# Patient Record
Sex: Female | Born: 1950
Health system: Southern US, Community
[De-identification: ages and names within clinical notes are randomized; demographics above are authoritative.]

## PROBLEM LIST (undated history)

## (undated) DIAGNOSIS — Z9289 Personal history of other medical treatment: Secondary | ICD-10-CM

## (undated) DIAGNOSIS — J45909 Unspecified asthma, uncomplicated: Secondary | ICD-10-CM

## (undated) DIAGNOSIS — E079 Disorder of thyroid, unspecified: Secondary | ICD-10-CM

## (undated) DIAGNOSIS — L309 Dermatitis, unspecified: Secondary | ICD-10-CM

## (undated) DIAGNOSIS — G43909 Migraine, unspecified, not intractable, without status migrainosus: Secondary | ICD-10-CM

## (undated) DIAGNOSIS — K529 Noninfective gastroenteritis and colitis, unspecified: Secondary | ICD-10-CM

## (undated) DIAGNOSIS — I10 Essential (primary) hypertension: Secondary | ICD-10-CM

## (undated) DIAGNOSIS — J301 Allergic rhinitis due to pollen: Secondary | ICD-10-CM

## (undated) DIAGNOSIS — F609 Personality disorder, unspecified: Secondary | ICD-10-CM

## (undated) DIAGNOSIS — Z8489 Family history of other specified conditions: Secondary | ICD-10-CM

## (undated) DIAGNOSIS — J329 Chronic sinusitis, unspecified: Secondary | ICD-10-CM

## (undated) DIAGNOSIS — M810 Age-related osteoporosis without current pathological fracture: Secondary | ICD-10-CM

## (undated) DIAGNOSIS — M722 Plantar fascial fibromatosis: Secondary | ICD-10-CM

## (undated) DIAGNOSIS — I8393 Asymptomatic varicose veins of bilateral lower extremities: Secondary | ICD-10-CM

## (undated) DIAGNOSIS — N8111 Cystocele, midline: Secondary | ICD-10-CM

## (undated) DIAGNOSIS — F4322 Adjustment disorder with anxiety: Secondary | ICD-10-CM

## (undated) DIAGNOSIS — C801 Malignant (primary) neoplasm, unspecified: Secondary | ICD-10-CM

## (undated) DIAGNOSIS — K635 Polyp of colon: Secondary | ICD-10-CM

## (undated) DIAGNOSIS — R5381 Other malaise: Secondary | ICD-10-CM

## (undated) DIAGNOSIS — R32 Unspecified urinary incontinence: Secondary | ICD-10-CM

## (undated) DIAGNOSIS — G3184 Mild cognitive impairment, so stated: Secondary | ICD-10-CM

## (undated) DIAGNOSIS — N816 Rectocele: Secondary | ICD-10-CM

## (undated) DIAGNOSIS — Z780 Asymptomatic menopausal state: Secondary | ICD-10-CM

## (undated) DIAGNOSIS — N312 Flaccid neuropathic bladder, not elsewhere classified: Secondary | ICD-10-CM

## (undated) DIAGNOSIS — K219 Gastro-esophageal reflux disease without esophagitis: Secondary | ICD-10-CM

## (undated) DIAGNOSIS — M7989 Other specified soft tissue disorders: Secondary | ICD-10-CM

## (undated) DIAGNOSIS — K5901 Slow transit constipation: Secondary | ICD-10-CM

## (undated) DIAGNOSIS — N952 Postmenopausal atrophic vaginitis: Secondary | ICD-10-CM

## (undated) DIAGNOSIS — R5383 Other fatigue: Secondary | ICD-10-CM

## (undated) DIAGNOSIS — N3942 Incontinence without sensory awareness: Secondary | ICD-10-CM

## (undated) DIAGNOSIS — C4491 Basal cell carcinoma of skin, unspecified: Secondary | ICD-10-CM

## (undated) DIAGNOSIS — I872 Venous insufficiency (chronic) (peripheral): Secondary | ICD-10-CM

## (undated) DIAGNOSIS — R198 Other specified symptoms and signs involving the digestive system and abdomen: Secondary | ICD-10-CM

## (undated) DIAGNOSIS — K5730 Diverticulosis of large intestine without perforation or abscess without bleeding: Secondary | ICD-10-CM

## (undated) DIAGNOSIS — G44209 Tension-type headache, unspecified, not intractable: Secondary | ICD-10-CM

## (undated) DIAGNOSIS — L659 Nonscarring hair loss, unspecified: Secondary | ICD-10-CM

## (undated) DIAGNOSIS — M1990 Unspecified osteoarthritis, unspecified site: Secondary | ICD-10-CM

## (undated) DIAGNOSIS — B009 Herpesviral infection, unspecified: Secondary | ICD-10-CM

## (undated) DIAGNOSIS — N993 Prolapse of vaginal vault after hysterectomy: Secondary | ICD-10-CM

## (undated) DIAGNOSIS — R072 Precordial pain: Secondary | ICD-10-CM

## (undated) DIAGNOSIS — N35919 Unspecified urethral stricture, male, unspecified site: Secondary | ICD-10-CM

## (undated) DIAGNOSIS — M62830 Muscle spasm of back: Secondary | ICD-10-CM

## (undated) DIAGNOSIS — J4530 Mild persistent asthma, uncomplicated: Secondary | ICD-10-CM

## (undated) DIAGNOSIS — F339 Major depressive disorder, recurrent, unspecified: Secondary | ICD-10-CM

## (undated) DIAGNOSIS — G629 Polyneuropathy, unspecified: Secondary | ICD-10-CM

## (undated) DIAGNOSIS — T7840XA Allergy, unspecified, initial encounter: Secondary | ICD-10-CM

## (undated) DIAGNOSIS — E039 Hypothyroidism, unspecified: Secondary | ICD-10-CM

## (undated) DIAGNOSIS — R1314 Dysphagia, pharyngoesophageal phase: Secondary | ICD-10-CM

## (undated) DIAGNOSIS — Z90710 Acquired absence of both cervix and uterus: Secondary | ICD-10-CM

## (undated) DIAGNOSIS — Z86010 Personal history of colonic polyps: Secondary | ICD-10-CM

## (undated) DIAGNOSIS — IMO0002 Reserved for concepts with insufficient information to code with codable children: Secondary | ICD-10-CM

## (undated) DIAGNOSIS — M5412 Radiculopathy, cervical region: Secondary | ICD-10-CM

## (undated) HISTORY — DX: Cystocele, midline: N81.11

## (undated) HISTORY — DX: Malignant (primary) neoplasm, unspecified: C80.1

## (undated) HISTORY — DX: Adjustment disorder with anxiety: F43.22

## (undated) HISTORY — DX: Personal history of colonic polyps: Z86.010

## (undated) HISTORY — DX: Gastro-esophageal reflux disease without esophagitis: K21.9

## (undated) HISTORY — DX: Family history of other specified conditions: Z84.89

## (undated) HISTORY — DX: Incontinence without sensory awareness: N39.42

## (undated) HISTORY — DX: Personality disorder, unspecified: F60.9

## (undated) HISTORY — DX: Disorder of thyroid, unspecified: E07.9

## (undated) HISTORY — PX: BREAST CYST ASPIRATION: SHX578

## (undated) HISTORY — DX: Postmenopausal atrophic vaginitis: N95.2

## (undated) HISTORY — DX: Major depressive disorder, recurrent, unspecified: F33.9

## (undated) HISTORY — DX: Other specified soft tissue disorders: M79.89

## (undated) HISTORY — DX: Other specified symptoms and signs involving the digestive system and abdomen: R19.8

## (undated) HISTORY — DX: Rectocele: N81.6

## (undated) HISTORY — DX: Acquired absence of both cervix and uterus: Z90.710

## (undated) HISTORY — DX: Nonscarring hair loss, unspecified: L65.9

## (undated) HISTORY — DX: Other malaise: R53.81

## (undated) HISTORY — DX: Tension-type headache, unspecified, not intractable: G44.209

## (undated) HISTORY — PX: VAGINAL PROLAPSE REPAIR: SHX830

## (undated) HISTORY — DX: Slow transit constipation: K59.01

## (undated) HISTORY — DX: Dysphagia, pharyngoesophageal phase: R13.14

## (undated) HISTORY — DX: Allergic rhinitis due to pollen: J30.1

## (undated) HISTORY — DX: Basal cell carcinoma of skin, unspecified: C44.91

## (undated) HISTORY — DX: Diverticulosis of large intestine without perforation or abscess without bleeding: K57.30

## (undated) HISTORY — DX: Asymptomatic menopausal state: Z78.0

## (undated) HISTORY — DX: Herpesviral infection, unspecified: B00.9

## (undated) HISTORY — DX: Venous insufficiency (chronic) (peripheral): I87.2

## (undated) HISTORY — DX: Precordial pain: R07.2

## (undated) HISTORY — PX: VAGINAL HYSTERECTOMY: SHX2639

## (undated) HISTORY — DX: Flaccid neuropathic bladder, not elsewhere classified: N31.2

## (undated) HISTORY — DX: Noninfective gastroenteritis and colitis, unspecified: K52.9

## (undated) HISTORY — DX: Chronic sinusitis, unspecified: J32.9

## (undated) HISTORY — DX: Migraine, unspecified, not intractable, without status migrainosus: G43.909

## (undated) HISTORY — DX: Unspecified asthma, uncomplicated: J45.909

## (undated) HISTORY — DX: Plantar fascial fibromatosis: M72.2

## (undated) HISTORY — DX: Polyneuropathy, unspecified: G62.9

## (undated) HISTORY — DX: Asymptomatic varicose veins of bilateral lower extremities: I83.93

## (undated) HISTORY — DX: Age-related osteoporosis without current pathological fracture: M81.0

## (undated) HISTORY — DX: Allergy, unspecified, initial encounter: T78.40XA

## (undated) HISTORY — DX: Muscle spasm of back: M62.830

## (undated) HISTORY — DX: Hypothyroidism, unspecified: E03.9

## (undated) HISTORY — DX: Polyp of colon: K63.5

## (undated) HISTORY — DX: Radiculopathy, cervical region: M54.12

## (undated) HISTORY — DX: Reserved for concepts with insufficient information to code with codable children: IMO0002

## (undated) HISTORY — DX: Other fatigue: R53.83

## (undated) HISTORY — PX: TONSILLECTOMY: SUR1361

## (undated) HISTORY — DX: Mild cognitive impairment, so stated: G31.84

## (undated) HISTORY — DX: Unspecified urinary incontinence: R32

## (undated) HISTORY — DX: Prolapse of vaginal vault after hysterectomy: N99.3

## (undated) HISTORY — DX: Unspecified osteoarthritis, unspecified site: M19.90

## (undated) HISTORY — DX: Essential (primary) hypertension: I10

## (undated) HISTORY — DX: Personal history of other medical treatment: Z92.89

## (undated) HISTORY — DX: Dermatitis, unspecified: L30.9

## (undated) HISTORY — DX: Unspecified urethral stricture, male, unspecified site: N35.919

## (undated) HISTORY — DX: Mild persistent asthma, uncomplicated: J45.30

---
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
## 2009-05-15 DIAGNOSIS — Z8669 Personal history of other diseases of the nervous system and sense organs: Secondary | ICD-10-CM

## 2009-05-15 HISTORY — DX: Personal history of other diseases of the nervous system and sense organs: Z86.69

## 2009-05-15 HISTORY — PX: RECTOCELE REPAIR: SHX761

## 2012-10-19 DIAGNOSIS — Z79899 Other long term (current) drug therapy: Secondary | ICD-10-CM

## 2012-10-19 DIAGNOSIS — G43909 Migraine, unspecified, not intractable, without status migrainosus: Secondary | ICD-10-CM | POA: Insufficient documentation

## 2012-10-19 DIAGNOSIS — F609 Personality disorder, unspecified: Secondary | ICD-10-CM

## 2012-10-19 HISTORY — DX: Other long term (current) drug therapy: Z79.899

## 2012-10-19 HISTORY — DX: Personality disorder, unspecified: F60.9

## 2013-06-02 DIAGNOSIS — F449 Dissociative and conversion disorder, unspecified: Secondary | ICD-10-CM

## 2013-06-02 HISTORY — DX: Dissociative and conversion disorder, unspecified: F44.9

## 2013-07-14 DIAGNOSIS — F339 Major depressive disorder, recurrent, unspecified: Secondary | ICD-10-CM

## 2013-07-14 HISTORY — DX: Major depressive disorder, recurrent, unspecified: F33.9

## 2013-10-30 DIAGNOSIS — N816 Rectocele: Secondary | ICD-10-CM

## 2013-10-30 DIAGNOSIS — N993 Prolapse of vaginal vault after hysterectomy: Secondary | ICD-10-CM

## 2013-10-30 DIAGNOSIS — E039 Hypothyroidism, unspecified: Secondary | ICD-10-CM | POA: Insufficient documentation

## 2013-10-30 DIAGNOSIS — N312 Flaccid neuropathic bladder, not elsewhere classified: Secondary | ICD-10-CM | POA: Insufficient documentation

## 2013-10-30 DIAGNOSIS — N35919 Unspecified urethral stricture, male, unspecified site: Secondary | ICD-10-CM

## 2013-10-30 DIAGNOSIS — N952 Postmenopausal atrophic vaginitis: Secondary | ICD-10-CM

## 2013-10-30 DIAGNOSIS — J329 Chronic sinusitis, unspecified: Secondary | ICD-10-CM

## 2013-10-30 DIAGNOSIS — L659 Nonscarring hair loss, unspecified: Secondary | ICD-10-CM | POA: Insufficient documentation

## 2013-10-30 DIAGNOSIS — K5730 Diverticulosis of large intestine without perforation or abscess without bleeding: Secondary | ICD-10-CM

## 2013-10-30 DIAGNOSIS — N8111 Cystocele, midline: Secondary | ICD-10-CM

## 2013-10-30 HISTORY — DX: Chronic sinusitis, unspecified: J32.9

## 2013-10-30 HISTORY — DX: Flaccid neuropathic bladder, not elsewhere classified: N31.2

## 2013-10-30 HISTORY — DX: Diverticulosis of large intestine without perforation or abscess without bleeding: K57.30

## 2013-10-30 HISTORY — DX: Postmenopausal atrophic vaginitis: N95.2

## 2013-10-30 HISTORY — DX: Unspecified urethral stricture, male, unspecified site: N35.919

## 2013-10-30 HISTORY — DX: Hypothyroidism, unspecified: E03.9

## 2013-10-30 HISTORY — DX: Rectocele: N81.6

## 2013-10-30 HISTORY — DX: Cystocele, midline: N81.11

## 2013-10-30 HISTORY — DX: Prolapse of vaginal vault after hysterectomy: N99.3

## 2013-10-30 HISTORY — DX: Nonscarring hair loss, unspecified: L65.9

## 2014-03-31 DIAGNOSIS — M545 Low back pain, unspecified: Secondary | ICD-10-CM

## 2014-03-31 DIAGNOSIS — Z780 Asymptomatic menopausal state: Secondary | ICD-10-CM | POA: Insufficient documentation

## 2014-03-31 HISTORY — DX: Asymptomatic menopausal state: Z78.0

## 2014-03-31 HISTORY — DX: Low back pain, unspecified: M54.50

## 2014-07-23 DIAGNOSIS — K5901 Slow transit constipation: Secondary | ICD-10-CM

## 2014-07-23 DIAGNOSIS — J301 Allergic rhinitis due to pollen: Secondary | ICD-10-CM

## 2014-07-23 DIAGNOSIS — IMO0002 Reserved for concepts with insufficient information to code with codable children: Secondary | ICD-10-CM | POA: Insufficient documentation

## 2014-07-23 HISTORY — DX: Slow transit constipation: K59.01

## 2014-07-23 HISTORY — DX: Reserved for concepts with insufficient information to code with codable children: IMO0002

## 2014-07-23 HISTORY — DX: Allergic rhinitis due to pollen: J30.1

## 2014-09-22 DIAGNOSIS — J45901 Unspecified asthma with (acute) exacerbation: Secondary | ICD-10-CM | POA: Insufficient documentation

## 2014-09-22 DIAGNOSIS — G629 Polyneuropathy, unspecified: Secondary | ICD-10-CM

## 2014-09-22 DIAGNOSIS — L309 Dermatitis, unspecified: Secondary | ICD-10-CM | POA: Insufficient documentation

## 2014-09-22 HISTORY — DX: Dermatitis, unspecified: L30.9

## 2014-09-22 HISTORY — DX: Unspecified asthma with (acute) exacerbation: J45.901

## 2014-09-22 HISTORY — DX: Polyneuropathy, unspecified: G62.9

## 2015-02-13 HISTORY — PX: SKIN CANCER EXCISION: SHX779

## 2015-02-17 DIAGNOSIS — R102 Pelvic and perineal pain: Secondary | ICD-10-CM

## 2015-02-17 HISTORY — DX: Pelvic and perineal pain: R10.2

## 2015-04-21 DIAGNOSIS — F4322 Adjustment disorder with anxiety: Secondary | ICD-10-CM

## 2015-04-21 DIAGNOSIS — R5381 Other malaise: Secondary | ICD-10-CM

## 2015-04-21 DIAGNOSIS — R5383 Other fatigue: Secondary | ICD-10-CM

## 2015-04-21 DIAGNOSIS — J208 Acute bronchitis due to other specified organisms: Secondary | ICD-10-CM

## 2015-04-21 DIAGNOSIS — S86899A Other injury of other muscle(s) and tendon(s) at lower leg level, unspecified leg, initial encounter: Secondary | ICD-10-CM | POA: Insufficient documentation

## 2015-04-21 DIAGNOSIS — R1314 Dysphagia, pharyngoesophageal phase: Secondary | ICD-10-CM

## 2015-04-21 DIAGNOSIS — M62830 Muscle spasm of back: Secondary | ICD-10-CM

## 2015-04-21 DIAGNOSIS — H6123 Impacted cerumen, bilateral: Secondary | ICD-10-CM

## 2015-04-21 DIAGNOSIS — C4491 Basal cell carcinoma of skin, unspecified: Secondary | ICD-10-CM | POA: Insufficient documentation

## 2015-04-21 HISTORY — DX: Other injury of other muscle(s) and tendon(s) at lower leg level, unspecified leg, initial encounter: S86.899A

## 2015-04-21 HISTORY — DX: Dysphagia, pharyngoesophageal phase: R13.14

## 2015-04-21 HISTORY — DX: Adjustment disorder with anxiety: F43.22

## 2015-04-21 HISTORY — DX: Muscle spasm of back: M62.830

## 2015-04-21 HISTORY — DX: Other malaise: R53.81

## 2015-04-21 HISTORY — DX: Acute bronchitis due to other specified organisms: J20.8

## 2015-04-21 HISTORY — DX: Basal cell carcinoma of skin, unspecified: C44.91

## 2015-04-21 HISTORY — DX: Impacted cerumen, bilateral: H61.23

## 2015-05-13 ENCOUNTER — Encounter: Payer: Self-pay | Admitting: Pediatrics

## 2015-05-13 ENCOUNTER — Ambulatory Visit (INDEPENDENT_AMBULATORY_CARE_PROVIDER_SITE_OTHER): Payer: Medicare Other | Admitting: Pediatrics

## 2015-05-13 VITALS — BP 120/80 | HR 70 | Temp 98.2°F | Resp 16 | Ht 66.0 in | Wt 146.4 lb

## 2015-05-13 DIAGNOSIS — L500 Allergic urticaria: Secondary | ICD-10-CM | POA: Insufficient documentation

## 2015-05-13 DIAGNOSIS — K219 Gastro-esophageal reflux disease without esophagitis: Secondary | ICD-10-CM | POA: Diagnosis not present

## 2015-05-13 DIAGNOSIS — J4530 Mild persistent asthma, uncomplicated: Secondary | ICD-10-CM | POA: Diagnosis not present

## 2015-05-13 DIAGNOSIS — J3089 Other allergic rhinitis: Secondary | ICD-10-CM | POA: Insufficient documentation

## 2015-05-13 HISTORY — DX: Gastro-esophageal reflux disease without esophagitis: K21.9

## 2015-05-13 HISTORY — DX: Mild persistent asthma, uncomplicated: J45.30

## 2015-05-13 HISTORY — DX: Allergic urticaria: L50.0

## 2015-05-13 HISTORY — DX: Other allergic rhinitis: J30.89

## 2015-05-13 MED ORDER — ALBUTEROL SULFATE HFA 108 (90 BASE) MCG/ACT IN AERS: 2.0000 | INHALATION_SPRAY | Freq: Four times a day (QID) | RESPIRATORY_TRACT | Status: DC | PRN

## 2015-05-13 MED ORDER — MONTELUKAST SODIUM 10 MG PO TABS: 10.0000 mg | ORAL_TABLET | Freq: Every day | ORAL | Status: DC

## 2015-05-13 MED ORDER — BECLOMETHASONE DIPROPIONATE 80 MCG/ACT IN AERS: 2.0000 | INHALATION_SPRAY | Freq: Every day | RESPIRATORY_TRACT | Status: DC

## 2015-05-13 NOTE — Progress Notes (Signed)
[place] [address] Dept: [phone]  New Patient Note  Patient ID: Jenna Campbell, female    DOB: 1951/01/07  Age: 64 y.o. MRN: [id number] Date of Office Visit: 05/13/2015 Referring provider: Charlaine Dalton, MD [facility] [id number] [place], [place] [address]    Chief Complaint: New Patient (Initial Visit)  HPI Jenna Campbell presents for evaluation of some coughing, wheezing and shortness of breath for about 2 years. She has never smoked cigarettes. She also has nasal congestion. She has aggravation of her symptoms on exposure to dust, cigarette smoke and possibly cats. She has had a normal chest x-ray recently. When she is exposed to strong odors such as colognes or food odors she notices some shortness of breath. If she eats very spicy foods she has hives and shortness of breath.  Review of Systems  Constitutional: Negative.   HENT:       Sinus problems for over 2 years  Eyes: Negative.   Respiratory:       Coughing spells and wheezing for over 2 years. Also some shortness of breath  Cardiovascular:       Hypertension  Gastrointestinal:       Gastroesophageal reflux  Genitourinary:       History of UTIs. Repair of rectocele and prolapsed uterus. Lichen sclerosis in the vulvar area. Taking part in a study in [place]: Lichen sclerosis  Musculoskeletal:       Osteoarthritis of her back and knees  Skin:       Frequent episodes of itchy skin. Removal of several basal and squamous carcinomas  from her back and leg  Neurological:       Migraine headaches  Endo/Heme/Allergies:       Hypothyroidism  Psychiatric/Behavioral:       Depression    Outpatient Encounter Prescriptions as of 05/13/2015  Medication Sig  . albuterol (PROAIR HFA) 108 (90 Base) MCG/ACT inhaler Inhale 2 puffs into the lungs every 6 (six) hours as needed for wheezing or shortness of breath.  Marland Kitchen albuterol (PROVENTIL HFA) 108 (90 Base) MCG/ACT inhaler  Inhale 2 puffs into the lungs every 6 (six) hours as needed for wheezing or shortness of breath.  Marland Kitchen albuterol (PROVENTIL) (2.5 MG/3ML) 0.083% nebulizer solution Take 2.5 mg by nebulization every 6 (six) hours as needed for wheezing or shortness of breath.  . beclomethasone (QVAR) 80 MCG/ACT inhaler Inhale 2 puffs into the lungs daily.  . benzonatate (TESSALON) 100 MG capsule Take 100 mg by mouth 3 (three) times daily as needed for cough.  . betamethasone valerate ointment (VALISONE) 0.1 % Apply 1 application topically 2 (two) times daily.  . DULoxetine (CYMBALTA) 30 MG capsule Take 30 mg by mouth daily.  . ergocalciferol (VITAMIN D2) 50000 units capsule Take 50,000 Units by mouth once a week.  . fexofenadine (ALLEGRA) 180 MG tablet Take 180 mg by mouth daily.  . fluticasone (FLONASE) 50 MCG/ACT nasal spray Place 1 spray into both nostrils daily.  . Gabapentin Enacarbil (HORIZANT) 600 MG TBCR Take 600 mg by mouth daily.  Marland Kitchen HYDROcodone-acetaminophen (NORCO) 7.5-325 MG tablet Take 1 tablet by mouth every 6 (six) hours as needed for moderate pain.  Marland Kitchen lactulose (CHRONULAC) 10 GM/15ML solution Take 5 g by mouth daily as needed for mild constipation.  Marland Kitchen levothyroxine (SYNTHROID, LEVOTHROID) 150 MCG tablet Take 150 mcg by mouth daily before breakfast.  . LORazepam (ATIVAN) 0.5 MG tablet Take 0.5 mg by mouth every 8 (eight) hours.  Marland Kitchen  meloxicam (MOBIC) 15 MG tablet Take 15 mg by mouth daily.  . Meth-Hyo-M Bl-Na Phos-Ph Sal (URIBEL) 118 MG CAPS Take by mouth.  . montelukast (SINGULAIR) 10 MG tablet Take 1 tablet (10 mg total) by mouth at bedtime.  Marland Kitchen omeprazole (PRILOSEC) 40 MG capsule Take 40 mg by mouth daily.  . temazepam (RESTORIL) 15 MG capsule Take 15 mg by mouth at bedtime as needed for sleep.  Marland Kitchen topiramate (TOPAMAX) 100 MG tablet Take 100 mg by mouth 2 (two) times daily.   No facility-administered encounter medications on file as of 05/13/2015.     Drug Allergies:  Allergies  Allergen Reactions   . Iodinated Diagnostic Agents Itching, Other (See Comments), Rash and Shortness Of Breath    Throat Swelling, Erythema  . Other Itching and Swelling    DUST  . Penicillins   . Diazepam Rash  . Sulfa Antibiotics Rash    Family History: Atticus's family history includes Asthma in her father. family history is positive for COPD and emphysema. Not clearcut if she has asthma in her family. There is no family history of hayfever, sinus problems, eczema, hives, and food allergies.  Social and environmental-she is disabled. She has a cat in the home. She is very worried about getting Alzheimer's disease because of many family members with Alzheimer's disease.    Physical Exam: BP 120/80 mmHg  Pulse 70  Temp(Src) 98.2 F (36.8 C) (Oral)  Resp 16  Ht 5\' 6"  (1.676 m)  Wt 146 lb 6.2 oz (66.4 kg)  BMI 23.64 kg/m2   Physical Exam  Constitutional: She is oriented to person, place, and time. She appears well-developed and well-nourished.  HENT:  Eyes normal. Ears normal. Nose normal. Pharynx normal.  Neck: Neck supple.  Cardiovascular:  S1 and S2 normal no murmurs  Pulmonary/Chest:  Clear to percussion and auscultation  Abdominal: Soft. There is no tenderness (no hepatosplenomegaly).  Lymphadenopathy:    She has no cervical adenopathy.  Neurological: She is alert and oriented to person, place, and time.  Skin:  Clear but she had dermographia noted  Psychiatric: She has a normal mood and affect. Her behavior is normal. Judgment and thought content normal.  Vitals reviewed.   Diagnostics: Allergy skin tests were positive to molds on intradermal testing only. She had slight reactivity to cat on intradermal testing only  FVC 2.55 L FEV1 2.66 L. Predicted FVC 3.43 L predicted FEV1 2.62 L. After albuterol 2 puffs FVC 2.94 L FEV1 2.61 L-spirometry in the normal range except for a mild reduction in the FVC and there was an 11% improvement in the FEV1 after albuterol 2  puffs   Assessment Assessment and Plan: 1. Mild persistent asthma, uncomplicated   2. Other allergic rhinitis   3. Gastroesophageal reflux disease without esophagitis   4. Allergic urticaria     Meds ordered this encounter  Medications  . montelukast (SINGULAIR) 10 MG tablet    Sig: Take 1 tablet (10 mg total) by mouth at bedtime.    Dispense:  30 tablet    Refill:  5  . beclomethasone (QVAR) 80 MCG/ACT inhaler    Sig: Inhale 2 puffs into the lungs daily.    Dispense:  1 Inhaler    Refill:  5  . albuterol (PROVENTIL HFA) 108 (90 Base) MCG/ACT inhaler    Sig: Inhale 2 puffs into the lungs every 6 (six) hours as needed for wheezing or shortness of breath.    Dispense:  1 Inhaler  Refill:  1    Patient Instructions  Environmental control of dust and mold Allegra 180 mg once a day Pro-air 2 puffs every 4 hours if needed for wheezing or coughing spells or instead albuterol 0.083% one unit dose every 4 hours if needed Fluticasone 2 sprays per nostril once a day Montelukast 10 mg once a day for cough or wheeze Do  foods with salicylates make  you itch Since she could not afford Advair, I gave her a sample of Qvar 80-2 puffs once a day to prevent coughing or wheezing to see if she can afford it and if she does better Continue on her other medications Her insurance plan would not cover Qvar 80, instead she may use Pulmicort 180- 2 puffs once a day    Return in about 6 weeks (around 06/24/2015).   Thank you for the opportunity to care for this patient.  Please do not hesitate to contact me with questions.  Penne Lash, M.D.  [facility] [address] [place], [place] [address] [phone]

## 2015-05-13 NOTE — Patient Instructions (Addendum)
Environmental control of dust and mold Allegra 180 mg once a day Pro-air 2 puffs every 4 hours if needed for wheezing or coughing spells or instead albuterol 0.083% one unit dose every 4 hours if needed Fluticasone 2 sprays per nostril once a day Montelukast 10 mg once a day for cough or wheeze Do  foods with salicylates make  you itch Since she could not afford Advair, I gave her a sample of Qvar 80-2 puffs once a day to prevent coughing or wheezing to see if she can afford it and if she does better Continue on her other medications Her insurance plan would not cover Qvar 80, instead she may use Pulmicort 180- 2 puffs once a day

## 2015-05-14 MED ORDER — BUDESONIDE 180 MCG/ACT IN AEPB: 2.0000 | INHALATION_SPRAY | Freq: Every day | RESPIRATORY_TRACT | Status: DC

## 2015-05-18 ENCOUNTER — Telehealth: Payer: Self-pay | Admitting: Allergy

## 2015-05-18 NOTE — Telephone Encounter (Signed)
WERE HURTING AND FACE SWOLLEN SOME. NO FEVER,OR COUGHPATIENT CALLED SAID SHE WAS FEELING WORSE THAN WHEN SHE WAS SEEN ON 05/13/15. SAID HER SINUS WERE HURTING AND FACE SWOLLEN SOME. NO FEVER BUT COUGHING SOME AND SHORTNESS OF BREATH. HARD TO COUGH ANYTHING UP. PLEASE ADVISE.

## 2015-05-18 NOTE — Telephone Encounter (Signed)
Patient called said she was feeling worse than when she was seen on 12/29 2016. Said her sinus were hurting and face swollen  some. No fever.coughing and some shortness of breath.Hard to cough any thing up. Please advise.

## 2015-05-19 NOTE — Telephone Encounter (Signed)
INFORMED PATIENT TO SEE DR. EJAC PER BR. BARDELAS.

## 2015-05-19 NOTE — Telephone Encounter (Signed)
Have patient call Dr.  Camillo Flaming office at [facility]. He takes care of these symptoms for her

## 2015-05-19 NOTE — Telephone Encounter (Signed)
However is she doing today. If she using her nose sprays. Can she take Keflex or Zithromax. Call her and let me know

## 2015-05-19 NOTE — Telephone Encounter (Signed)
Talked with patient said nose was drying up some. Using nose spray. Coughing some at night,still has tightness in chest . Patient got weak in store yesterday said right hand went numb and right arm hurting real bad,and felt like she didn't have any air.right ear is hurting some. Patient said she felt like she had a backpack or rocks sitting on chest.  Patient is allergic to all mycins.pcn,sulfa,diazepam,iodinated dye.

## 2015-06-03 ENCOUNTER — Telehealth: Payer: Self-pay | Admitting: Pediatrics

## 2015-06-03 ENCOUNTER — Other Ambulatory Visit: Payer: Self-pay | Admitting: Allergy

## 2015-06-03 MED ORDER — ALBUTEROL SULFATE HFA 108 (90 BASE) MCG/ACT IN AERS: 2.0000 | INHALATION_SPRAY | Freq: Four times a day (QID) | RESPIRATORY_TRACT | Status: DC | PRN

## 2015-06-03 MED ORDER — ALBUTEROL SULFATE HFA 108 (90 BASE) MCG/ACT IN AERS: 2.0000 | INHALATION_SPRAY | RESPIRATORY_TRACT | Status: DC | PRN

## 2015-06-03 NOTE — Telephone Encounter (Signed)
Patient needs medication refilled and pharmacy told patient that we faxed back form stating that Dr. Shaune Leeks did not prescribe it and patient says that he did. Please call patient back. She is requesting that the med be refilled.

## 2015-06-04 ENCOUNTER — Other Ambulatory Visit: Payer: Self-pay

## 2015-06-04 MED ORDER — BUDESONIDE 180 MCG/ACT IN AEPB: 2.0000 | INHALATION_SPRAY | Freq: Every day | RESPIRATORY_TRACT | Status: DC

## 2015-06-04 NOTE — Telephone Encounter (Signed)
This was taken care of by Reinaldo Raddle yesterday

## 2015-06-04 NOTE — Telephone Encounter (Signed)
Please advise 

## 2015-06-08 NOTE — Telephone Encounter (Signed)
FAXED APPROVAL TO [facility] FOR  PRO-AIR AND INFORMED PATIENT.

## 2015-06-17 ENCOUNTER — Ambulatory Visit: Payer: Medicare Other | Admitting: Pediatrics

## 2015-07-06 ENCOUNTER — Ambulatory Visit: Payer: Medicare Other | Admitting: Pediatrics

## 2015-08-18 DIAGNOSIS — G3184 Mild cognitive impairment, so stated: Secondary | ICD-10-CM | POA: Insufficient documentation

## 2015-08-18 DIAGNOSIS — R072 Precordial pain: Secondary | ICD-10-CM | POA: Insufficient documentation

## 2015-08-18 HISTORY — DX: Mild cognitive impairment of uncertain or unknown etiology: G31.84

## 2015-08-18 HISTORY — DX: Precordial pain: R07.2

## 2015-09-17 DIAGNOSIS — R413 Other amnesia: Secondary | ICD-10-CM | POA: Insufficient documentation

## 2015-09-17 DIAGNOSIS — M545 Low back pain, unspecified: Secondary | ICD-10-CM | POA: Insufficient documentation

## 2015-09-17 DIAGNOSIS — M542 Cervicalgia: Secondary | ICD-10-CM | POA: Insufficient documentation

## 2015-09-17 DIAGNOSIS — M5412 Radiculopathy, cervical region: Secondary | ICD-10-CM | POA: Insufficient documentation

## 2015-09-17 DIAGNOSIS — G44209 Tension-type headache, unspecified, not intractable: Secondary | ICD-10-CM

## 2015-09-17 HISTORY — DX: Low back pain, unspecified: M54.50

## 2015-09-17 HISTORY — DX: Radiculopathy, cervical region: M54.12

## 2015-09-17 HISTORY — DX: Cervicalgia: M54.2

## 2015-09-17 HISTORY — DX: Other amnesia: R41.3

## 2015-09-17 HISTORY — DX: Tension-type headache, unspecified, not intractable: G44.209

## 2015-10-05 HISTORY — PX: MOHS SURGERY: SHX181

## 2015-12-16 DIAGNOSIS — R300 Dysuria: Secondary | ICD-10-CM

## 2015-12-16 DIAGNOSIS — J4520 Mild intermittent asthma, uncomplicated: Secondary | ICD-10-CM | POA: Insufficient documentation

## 2015-12-16 DIAGNOSIS — G44209 Tension-type headache, unspecified, not intractable: Secondary | ICD-10-CM

## 2015-12-16 DIAGNOSIS — K529 Noninfective gastroenteritis and colitis, unspecified: Secondary | ICD-10-CM

## 2015-12-16 DIAGNOSIS — R600 Localized edema: Secondary | ICD-10-CM | POA: Insufficient documentation

## 2015-12-16 DIAGNOSIS — R1084 Generalized abdominal pain: Secondary | ICD-10-CM | POA: Insufficient documentation

## 2015-12-16 HISTORY — DX: Mild intermittent asthma, uncomplicated: J45.20

## 2015-12-16 HISTORY — DX: Dysuria: R30.0

## 2015-12-16 HISTORY — DX: Noninfective gastroenteritis and colitis, unspecified: K52.9

## 2015-12-16 HISTORY — DX: Localized edema: R60.0

## 2015-12-16 HISTORY — DX: Generalized abdominal pain: R10.84

## 2015-12-16 HISTORY — DX: Tension-type headache, unspecified, not intractable: G44.209

## 2016-01-03 ENCOUNTER — Other Ambulatory Visit: Payer: Self-pay | Admitting: Pediatrics

## 2016-01-20 DIAGNOSIS — M722 Plantar fascial fibromatosis: Secondary | ICD-10-CM | POA: Insufficient documentation

## 2016-01-20 HISTORY — DX: Plantar fascial fibromatosis: M72.2

## 2016-02-14 DIAGNOSIS — R05 Cough: Secondary | ICD-10-CM | POA: Insufficient documentation

## 2016-02-14 DIAGNOSIS — Z8489 Family history of other specified conditions: Secondary | ICD-10-CM

## 2016-02-14 DIAGNOSIS — R059 Cough, unspecified: Secondary | ICD-10-CM

## 2016-02-14 HISTORY — DX: Family history of other specified conditions: Z84.89

## 2016-02-14 HISTORY — DX: Cough, unspecified: R05.9

## 2016-03-06 ENCOUNTER — Other Ambulatory Visit: Payer: Self-pay | Admitting: Allergy

## 2016-04-14 HISTORY — PX: ESOPHAGOGASTRODUODENOSCOPY: SHX1529

## 2016-04-14 HISTORY — PX: COLONOSCOPY: SHX174

## 2016-04-24 DIAGNOSIS — M1990 Unspecified osteoarthritis, unspecified site: Secondary | ICD-10-CM | POA: Insufficient documentation

## 2016-04-24 DIAGNOSIS — I8393 Asymptomatic varicose veins of bilateral lower extremities: Secondary | ICD-10-CM | POA: Insufficient documentation

## 2016-04-24 HISTORY — DX: Unspecified osteoarthritis, unspecified site: M19.90

## 2016-04-24 HISTORY — DX: Asymptomatic varicose veins of bilateral lower extremities: I83.93

## 2016-05-17 DIAGNOSIS — I83812 Varicose veins of left lower extremities with pain: Secondary | ICD-10-CM | POA: Diagnosis not present

## 2016-05-17 DIAGNOSIS — M7989 Other specified soft tissue disorders: Secondary | ICD-10-CM | POA: Diagnosis not present

## 2016-05-17 HISTORY — DX: Other specified soft tissue disorders: M79.89

## 2016-05-19 DIAGNOSIS — I83812 Varicose veins of left lower extremities with pain: Secondary | ICD-10-CM | POA: Diagnosis not present

## 2016-06-06 DIAGNOSIS — I872 Venous insufficiency (chronic) (peripheral): Secondary | ICD-10-CM

## 2016-06-06 DIAGNOSIS — I83812 Varicose veins of left lower extremities with pain: Secondary | ICD-10-CM | POA: Diagnosis not present

## 2016-06-06 HISTORY — DX: Venous insufficiency (chronic) (peripheral): I87.2

## 2016-06-12 DIAGNOSIS — F4322 Adjustment disorder with anxiety: Secondary | ICD-10-CM | POA: Diagnosis not present

## 2016-06-12 DIAGNOSIS — F330 Major depressive disorder, recurrent, mild: Secondary | ICD-10-CM | POA: Diagnosis not present

## 2016-06-12 DIAGNOSIS — F449 Dissociative and conversion disorder, unspecified: Secondary | ICD-10-CM | POA: Diagnosis not present

## 2016-06-14 DIAGNOSIS — R198 Other specified symptoms and signs involving the digestive system and abdomen: Secondary | ICD-10-CM | POA: Insufficient documentation

## 2016-06-14 DIAGNOSIS — Z860101 Personal history of adenomatous and serrated colon polyps: Secondary | ICD-10-CM | POA: Insufficient documentation

## 2016-06-14 DIAGNOSIS — J40 Bronchitis, not specified as acute or chronic: Secondary | ICD-10-CM | POA: Diagnosis not present

## 2016-06-14 DIAGNOSIS — R194 Change in bowel habit: Secondary | ICD-10-CM | POA: Diagnosis not present

## 2016-06-14 DIAGNOSIS — K219 Gastro-esophageal reflux disease without esophagitis: Secondary | ICD-10-CM | POA: Diagnosis not present

## 2016-06-14 DIAGNOSIS — R1319 Other dysphagia: Secondary | ICD-10-CM

## 2016-06-14 DIAGNOSIS — J0110 Acute frontal sinusitis, unspecified: Secondary | ICD-10-CM | POA: Diagnosis not present

## 2016-06-14 DIAGNOSIS — R1310 Dysphagia, unspecified: Secondary | ICD-10-CM | POA: Diagnosis not present

## 2016-06-14 DIAGNOSIS — Z86010 Personal history of colonic polyps: Secondary | ICD-10-CM | POA: Diagnosis not present

## 2016-06-14 HISTORY — DX: Personal history of adenomatous and serrated colon polyps: Z86.0101

## 2016-06-14 HISTORY — DX: Other dysphagia: R13.19

## 2016-06-14 HISTORY — DX: Personal history of colonic polyps: Z86.010

## 2016-06-14 HISTORY — DX: Other specified symptoms and signs involving the digestive system and abdomen: R19.8

## 2016-06-30 DIAGNOSIS — Z23 Encounter for immunization: Secondary | ICD-10-CM | POA: Diagnosis not present

## 2016-07-03 DIAGNOSIS — F332 Major depressive disorder, recurrent severe without psychotic features: Secondary | ICD-10-CM | POA: Diagnosis not present

## 2016-07-13 DIAGNOSIS — M722 Plantar fascial fibromatosis: Secondary | ICD-10-CM | POA: Diagnosis not present

## 2016-07-13 DIAGNOSIS — G44209 Tension-type headache, unspecified, not intractable: Secondary | ICD-10-CM | POA: Diagnosis not present

## 2016-07-13 DIAGNOSIS — M542 Cervicalgia: Secondary | ICD-10-CM | POA: Diagnosis not present

## 2016-07-13 DIAGNOSIS — M545 Low back pain: Secondary | ICD-10-CM | POA: Diagnosis not present

## 2016-07-14 DIAGNOSIS — M545 Low back pain: Secondary | ICD-10-CM | POA: Diagnosis not present

## 2016-07-14 DIAGNOSIS — M722 Plantar fascial fibromatosis: Secondary | ICD-10-CM | POA: Diagnosis not present

## 2016-07-14 DIAGNOSIS — G44209 Tension-type headache, unspecified, not intractable: Secondary | ICD-10-CM | POA: Diagnosis not present

## 2016-07-14 DIAGNOSIS — M542 Cervicalgia: Secondary | ICD-10-CM | POA: Diagnosis not present

## 2016-07-17 DIAGNOSIS — R0602 Shortness of breath: Secondary | ICD-10-CM

## 2016-07-17 DIAGNOSIS — R072 Precordial pain: Secondary | ICD-10-CM | POA: Diagnosis not present

## 2016-07-17 DIAGNOSIS — J208 Acute bronchitis due to other specified organisms: Secondary | ICD-10-CM | POA: Diagnosis not present

## 2016-07-17 DIAGNOSIS — M5412 Radiculopathy, cervical region: Secondary | ICD-10-CM | POA: Diagnosis not present

## 2016-07-17 DIAGNOSIS — N3942 Incontinence without sensory awareness: Secondary | ICD-10-CM

## 2016-07-17 HISTORY — DX: Shortness of breath: R06.02

## 2016-07-17 HISTORY — DX: Incontinence without sensory awareness: N39.42

## 2016-07-21 DIAGNOSIS — F332 Major depressive disorder, recurrent severe without psychotic features: Secondary | ICD-10-CM | POA: Diagnosis not present

## 2016-08-01 DIAGNOSIS — B001 Herpesviral vesicular dermatitis: Secondary | ICD-10-CM | POA: Diagnosis not present

## 2016-08-01 DIAGNOSIS — D485 Neoplasm of uncertain behavior of skin: Secondary | ICD-10-CM | POA: Diagnosis not present

## 2016-08-07 DIAGNOSIS — B001 Herpesviral vesicular dermatitis: Secondary | ICD-10-CM | POA: Diagnosis not present

## 2016-08-07 DIAGNOSIS — F332 Major depressive disorder, recurrent severe without psychotic features: Secondary | ICD-10-CM | POA: Diagnosis not present

## 2016-08-15 DIAGNOSIS — R531 Weakness: Secondary | ICD-10-CM | POA: Diagnosis not present

## 2016-08-15 DIAGNOSIS — R2689 Other abnormalities of gait and mobility: Secondary | ICD-10-CM | POA: Diagnosis not present

## 2016-08-15 DIAGNOSIS — R293 Abnormal posture: Secondary | ICD-10-CM | POA: Diagnosis not present

## 2016-08-15 DIAGNOSIS — M542 Cervicalgia: Secondary | ICD-10-CM | POA: Diagnosis not present

## 2016-08-15 DIAGNOSIS — Z7409 Other reduced mobility: Secondary | ICD-10-CM | POA: Diagnosis not present

## 2016-08-15 DIAGNOSIS — M5440 Lumbago with sciatica, unspecified side: Secondary | ICD-10-CM | POA: Diagnosis not present

## 2016-08-16 DIAGNOSIS — R072 Precordial pain: Secondary | ICD-10-CM | POA: Diagnosis not present

## 2016-08-16 DIAGNOSIS — R0602 Shortness of breath: Secondary | ICD-10-CM | POA: Diagnosis not present

## 2016-08-17 DIAGNOSIS — J45909 Unspecified asthma, uncomplicated: Secondary | ICD-10-CM | POA: Diagnosis not present

## 2016-08-17 DIAGNOSIS — Z7409 Other reduced mobility: Secondary | ICD-10-CM | POA: Diagnosis not present

## 2016-08-17 DIAGNOSIS — R2689 Other abnormalities of gait and mobility: Secondary | ICD-10-CM | POA: Diagnosis not present

## 2016-08-17 DIAGNOSIS — M542 Cervicalgia: Secondary | ICD-10-CM | POA: Diagnosis not present

## 2016-08-17 DIAGNOSIS — M5440 Lumbago with sciatica, unspecified side: Secondary | ICD-10-CM | POA: Diagnosis not present

## 2016-08-17 DIAGNOSIS — R293 Abnormal posture: Secondary | ICD-10-CM | POA: Diagnosis not present

## 2016-08-17 DIAGNOSIS — R531 Weakness: Secondary | ICD-10-CM | POA: Diagnosis not present

## 2016-08-17 DIAGNOSIS — B009 Herpesviral infection, unspecified: Secondary | ICD-10-CM | POA: Insufficient documentation

## 2016-08-17 HISTORY — DX: Herpesviral infection, unspecified: B00.9

## 2016-08-22 DIAGNOSIS — R300 Dysuria: Secondary | ICD-10-CM | POA: Diagnosis not present

## 2016-08-22 DIAGNOSIS — N952 Postmenopausal atrophic vaginitis: Secondary | ICD-10-CM | POA: Diagnosis not present

## 2016-08-22 DIAGNOSIS — Z7409 Other reduced mobility: Secondary | ICD-10-CM | POA: Diagnosis not present

## 2016-08-22 DIAGNOSIS — Z87448 Personal history of other diseases of urinary system: Secondary | ICD-10-CM | POA: Diagnosis not present

## 2016-08-22 DIAGNOSIS — M542 Cervicalgia: Secondary | ICD-10-CM | POA: Diagnosis not present

## 2016-08-22 DIAGNOSIS — R293 Abnormal posture: Secondary | ICD-10-CM | POA: Diagnosis not present

## 2016-08-22 DIAGNOSIS — R531 Weakness: Secondary | ICD-10-CM | POA: Diagnosis not present

## 2016-08-22 DIAGNOSIS — R2689 Other abnormalities of gait and mobility: Secondary | ICD-10-CM | POA: Diagnosis not present

## 2016-08-22 DIAGNOSIS — N3001 Acute cystitis with hematuria: Secondary | ICD-10-CM | POA: Diagnosis not present

## 2016-08-22 DIAGNOSIS — M5440 Lumbago with sciatica, unspecified side: Secondary | ICD-10-CM | POA: Diagnosis not present

## 2016-08-22 DIAGNOSIS — R8290 Unspecified abnormal findings in urine: Secondary | ICD-10-CM | POA: Diagnosis not present

## 2016-08-22 DIAGNOSIS — Z872 Personal history of diseases of the skin and subcutaneous tissue: Secondary | ICD-10-CM | POA: Diagnosis not present

## 2016-08-25 DIAGNOSIS — R2689 Other abnormalities of gait and mobility: Secondary | ICD-10-CM | POA: Diagnosis not present

## 2016-08-25 DIAGNOSIS — R293 Abnormal posture: Secondary | ICD-10-CM | POA: Diagnosis not present

## 2016-08-25 DIAGNOSIS — M5440 Lumbago with sciatica, unspecified side: Secondary | ICD-10-CM | POA: Diagnosis not present

## 2016-08-25 DIAGNOSIS — R531 Weakness: Secondary | ICD-10-CM | POA: Diagnosis not present

## 2016-08-25 DIAGNOSIS — Z7409 Other reduced mobility: Secondary | ICD-10-CM | POA: Diagnosis not present

## 2016-08-25 DIAGNOSIS — M542 Cervicalgia: Secondary | ICD-10-CM | POA: Diagnosis not present

## 2016-08-29 DIAGNOSIS — F332 Major depressive disorder, recurrent severe without psychotic features: Secondary | ICD-10-CM | POA: Diagnosis not present

## 2016-08-31 DIAGNOSIS — M5440 Lumbago with sciatica, unspecified side: Secondary | ICD-10-CM | POA: Diagnosis not present

## 2016-08-31 DIAGNOSIS — M542 Cervicalgia: Secondary | ICD-10-CM | POA: Diagnosis not present

## 2016-08-31 DIAGNOSIS — Z7409 Other reduced mobility: Secondary | ICD-10-CM | POA: Diagnosis not present

## 2016-08-31 DIAGNOSIS — R531 Weakness: Secondary | ICD-10-CM | POA: Diagnosis not present

## 2016-08-31 DIAGNOSIS — R2689 Other abnormalities of gait and mobility: Secondary | ICD-10-CM | POA: Diagnosis not present

## 2016-08-31 DIAGNOSIS — R293 Abnormal posture: Secondary | ICD-10-CM | POA: Diagnosis not present

## 2016-09-05 DIAGNOSIS — Z7409 Other reduced mobility: Secondary | ICD-10-CM | POA: Diagnosis not present

## 2016-09-05 DIAGNOSIS — R531 Weakness: Secondary | ICD-10-CM | POA: Diagnosis not present

## 2016-09-05 DIAGNOSIS — R293 Abnormal posture: Secondary | ICD-10-CM | POA: Diagnosis not present

## 2016-09-05 DIAGNOSIS — M542 Cervicalgia: Secondary | ICD-10-CM | POA: Diagnosis not present

## 2016-09-05 DIAGNOSIS — R2689 Other abnormalities of gait and mobility: Secondary | ICD-10-CM | POA: Diagnosis not present

## 2016-09-05 DIAGNOSIS — M5440 Lumbago with sciatica, unspecified side: Secondary | ICD-10-CM | POA: Diagnosis not present

## 2016-09-06 DIAGNOSIS — F449 Dissociative and conversion disorder, unspecified: Secondary | ICD-10-CM | POA: Diagnosis not present

## 2016-09-06 DIAGNOSIS — F330 Major depressive disorder, recurrent, mild: Secondary | ICD-10-CM | POA: Diagnosis not present

## 2016-09-11 DIAGNOSIS — F332 Major depressive disorder, recurrent severe without psychotic features: Secondary | ICD-10-CM | POA: Diagnosis not present

## 2016-09-11 DIAGNOSIS — N898 Other specified noninflammatory disorders of vagina: Secondary | ICD-10-CM | POA: Diagnosis not present

## 2016-09-11 DIAGNOSIS — R300 Dysuria: Secondary | ICD-10-CM | POA: Diagnosis not present

## 2016-09-11 DIAGNOSIS — N952 Postmenopausal atrophic vaginitis: Secondary | ICD-10-CM | POA: Diagnosis not present

## 2016-09-11 DIAGNOSIS — Z87448 Personal history of other diseases of urinary system: Secondary | ICD-10-CM | POA: Diagnosis not present

## 2016-09-12 DIAGNOSIS — N761 Subacute and chronic vaginitis: Secondary | ICD-10-CM | POA: Diagnosis not present

## 2016-09-19 DIAGNOSIS — N390 Urinary tract infection, site not specified: Secondary | ICD-10-CM | POA: Diagnosis not present

## 2016-09-19 DIAGNOSIS — R319 Hematuria, unspecified: Secondary | ICD-10-CM | POA: Diagnosis not present

## 2016-09-19 DIAGNOSIS — R300 Dysuria: Secondary | ICD-10-CM | POA: Diagnosis not present

## 2016-10-02 DIAGNOSIS — F332 Major depressive disorder, recurrent severe without psychotic features: Secondary | ICD-10-CM | POA: Diagnosis not present

## 2016-10-05 DIAGNOSIS — M2550 Pain in unspecified joint: Secondary | ICD-10-CM | POA: Diagnosis not present

## 2016-10-05 DIAGNOSIS — Z79899 Other long term (current) drug therapy: Secondary | ICD-10-CM | POA: Diagnosis not present

## 2016-10-09 DIAGNOSIS — J019 Acute sinusitis, unspecified: Secondary | ICD-10-CM | POA: Diagnosis not present

## 2016-10-09 DIAGNOSIS — N390 Urinary tract infection, site not specified: Secondary | ICD-10-CM | POA: Diagnosis not present

## 2016-10-20 DIAGNOSIS — Z0001 Encounter for general adult medical examination with abnormal findings: Secondary | ICD-10-CM | POA: Diagnosis not present

## 2016-10-20 DIAGNOSIS — N390 Urinary tract infection, site not specified: Secondary | ICD-10-CM | POA: Diagnosis not present

## 2016-10-20 DIAGNOSIS — R319 Hematuria, unspecified: Secondary | ICD-10-CM | POA: Diagnosis not present

## 2016-10-20 DIAGNOSIS — R8299 Other abnormal findings in urine: Secondary | ICD-10-CM | POA: Diagnosis not present

## 2016-10-20 DIAGNOSIS — R300 Dysuria: Secondary | ICD-10-CM | POA: Diagnosis not present

## 2016-10-20 DIAGNOSIS — H6123 Impacted cerumen, bilateral: Secondary | ICD-10-CM | POA: Diagnosis not present

## 2016-10-30 DIAGNOSIS — F332 Major depressive disorder, recurrent severe without psychotic features: Secondary | ICD-10-CM | POA: Diagnosis not present

## 2016-11-02 DIAGNOSIS — G44209 Tension-type headache, unspecified, not intractable: Secondary | ICD-10-CM | POA: Diagnosis not present

## 2016-11-02 DIAGNOSIS — M542 Cervicalgia: Secondary | ICD-10-CM | POA: Diagnosis not present

## 2016-11-02 DIAGNOSIS — R413 Other amnesia: Secondary | ICD-10-CM | POA: Diagnosis not present

## 2016-11-02 DIAGNOSIS — R51 Headache: Secondary | ICD-10-CM | POA: Diagnosis not present

## 2016-11-02 DIAGNOSIS — R519 Headache, unspecified: Secondary | ICD-10-CM

## 2016-11-02 HISTORY — DX: Headache, unspecified: R51.9

## 2016-11-27 DIAGNOSIS — F330 Major depressive disorder, recurrent, mild: Secondary | ICD-10-CM | POA: Diagnosis not present

## 2016-11-27 DIAGNOSIS — F449 Dissociative and conversion disorder, unspecified: Secondary | ICD-10-CM | POA: Diagnosis not present

## 2016-12-11 DIAGNOSIS — F332 Major depressive disorder, recurrent severe without psychotic features: Secondary | ICD-10-CM | POA: Diagnosis not present

## 2016-12-29 DIAGNOSIS — F332 Major depressive disorder, recurrent severe without psychotic features: Secondary | ICD-10-CM | POA: Diagnosis not present

## 2017-01-04 DIAGNOSIS — R112 Nausea with vomiting, unspecified: Secondary | ICD-10-CM | POA: Diagnosis not present

## 2017-01-04 DIAGNOSIS — A09 Infectious gastroenteritis and colitis, unspecified: Secondary | ICD-10-CM | POA: Diagnosis not present

## 2017-01-18 DIAGNOSIS — F332 Major depressive disorder, recurrent severe without psychotic features: Secondary | ICD-10-CM | POA: Diagnosis not present

## 2017-01-24 DIAGNOSIS — J0100 Acute maxillary sinusitis, unspecified: Secondary | ICD-10-CM | POA: Diagnosis not present

## 2017-01-24 DIAGNOSIS — H6123 Impacted cerumen, bilateral: Secondary | ICD-10-CM | POA: Diagnosis not present

## 2017-01-25 DIAGNOSIS — H6123 Impacted cerumen, bilateral: Secondary | ICD-10-CM | POA: Diagnosis not present

## 2017-01-25 DIAGNOSIS — H6093 Unspecified otitis externa, bilateral: Secondary | ICD-10-CM | POA: Diagnosis not present

## 2017-02-02 DIAGNOSIS — J019 Acute sinusitis, unspecified: Secondary | ICD-10-CM | POA: Diagnosis not present

## 2017-02-02 DIAGNOSIS — H6691 Otitis media, unspecified, right ear: Secondary | ICD-10-CM | POA: Diagnosis not present

## 2017-02-14 DIAGNOSIS — F332 Major depressive disorder, recurrent severe without psychotic features: Secondary | ICD-10-CM | POA: Diagnosis not present

## 2017-02-22 ENCOUNTER — Ambulatory Visit: Payer: Self-pay | Admitting: Family Medicine

## 2017-03-01 ENCOUNTER — Ambulatory Visit (INDEPENDENT_AMBULATORY_CARE_PROVIDER_SITE_OTHER): Payer: Medicare Other | Admitting: Family Medicine

## 2017-03-01 ENCOUNTER — Encounter: Payer: Self-pay | Admitting: Family Medicine

## 2017-03-01 VITALS — BP 124/80 | HR 63 | Temp 97.9°F | Ht 66.0 in | Wt 146.5 lb

## 2017-03-01 DIAGNOSIS — Z23 Encounter for immunization: Secondary | ICD-10-CM

## 2017-03-01 DIAGNOSIS — R0789 Other chest pain: Secondary | ICD-10-CM | POA: Diagnosis not present

## 2017-03-01 NOTE — Progress Notes (Signed)
Chief Complaint  Patient presents with  . Establish Care       New Patient Visit SUBJECTIVE: HPI: Jenna Campbell is an 66 y.o.female who is being seen for establishing care.  The patient was previously seen at [facility].  Duration of issue: 2 years, got worse around 2 weeks ago Quality: sharp Palliation: none Provocation: when she sits down in evening Severity: 10/10 Radiation: none  Duration of chest pain: 1-2 hours Associated symptoms: SOB, L arm pain, sometimes L jaw pain Cardiac history: none Family heart history: father had heart disease Smoker? No Saw Cardiologist thru [facility], EKG neg, scheduled for stress echo, did not do due to concern for the chemical. Would like to have it done thru [facility] now.    Allergies  Allergen Reactions  . Iodinated Diagnostic Agents Itching, Other (See Comments), Rash and Shortness Of Breath    Throat Swelling, Erythema  . Other Itching and Swelling    DUST  . Penicillins   . Diazepam Rash  . Sulfa Antibiotics Rash    Past Medical History:  Diagnosis Date  . Allergy   . Asthma   . Cancer ([place])    skin cancer  . Colon polyps   . GERD (gastroesophageal reflux disease)   . H/O: hysterectomy   . History of blood transfusion 1970  . Migraine   . Thyroid disease   . Urinary incontinence   . UTI (urinary tract infection)    Past Surgical History:  Procedure Laterality Date  . RECTOCELE REPAIR  2011  . TONSILLECTOMY    . VAGINAL HYSTERECTOMY    . VAGINAL PROLAPSE REPAIR     Social History   Social History  . Marital status: Divorced   Social History Main Topics  . Smoking status: Never Smoker  . Smokeless tobacco: Never Used  . Alcohol use No  . Drug use: No  . Sexual activity: No   Family History  Problem Relation Age of Onset  . Asthma Father   . Angina Father   . Emphysema Father   . Alzheimer's disease Mother   . Cancer Sister        died of cancer     Current Outpatient Prescriptions:  .  albuterol  (PROAIR HFA) 108 (90 Base) MCG/ACT inhaler, Inhale 2 puffs into the lungs every 4 (four) hours as needed for wheezing or shortness of breath., Disp: 1 Inhaler, Rfl: 3 .  beclomethasone (QVAR) 80 MCG/ACT inhaler, Inhale 2 puffs into the lungs daily., Disp: 1 Inhaler, Rfl: 5 .  budesonide (PULMICORT FLEXHALER) 180 MCG/ACT inhaler, Inhale 2 puffs into the lungs daily., Disp: 1 Inhaler, Rfl: 5 .  clobetasol cream (TEMOVATE) 9.62 %, Apply 1 application topically 2 (two) times daily., Disp: , Rfl:  .  ergocalciferol (VITAMIN D2) 50000 units capsule, Take 50,000 Units by mouth once a week., Disp: , Rfl:  .  fluticasone (FLONASE) 50 MCG/ACT nasal spray, Place 1 spray into both nostrils daily., Disp: , Rfl:  .  gabapentin (NEURONTIN) 300 MG capsule, Take 300 mg by mouth 2 (two) times daily., Disp: , Rfl:  .  levothyroxine (SYNTHROID, LEVOTHROID) 150 MCG tablet, Take 150 mcg by mouth daily before breakfast., Disp: , Rfl:  .  LORazepam (ATIVAN) 0.5 MG tablet, Take 0.5 mg by mouth every 8 (eight) hours., Disp: , Rfl:  .  omeprazole (PRILOSEC) 40 MG capsule, Take 40 mg by mouth daily., Disp: , Rfl:  .  temazepam (RESTORIL) 15 MG capsule, Take 15 mg by mouth at  bedtime as needed for sleep., Disp: , Rfl:  .  topiramate (TOPAMAX) 100 MG tablet, Take 100 mg by mouth 2 (two) times daily., Disp: , Rfl:  .  albuterol (PROVENTIL) (2.5 MG/3ML) 0.083% nebulizer solution, Take 2.5 mg by nebulization every 6 (six) hours as needed for wheezing or shortness of breath., Disp: , Rfl:  .  betamethasone valerate ointment (VALISONE) 0.1 %, Apply 1 application topically 2 (two) times daily., Disp: , Rfl:   No LMP recorded. Patient is postmenopausal.  ROS Cardiovascular: Denies current chest pain  Respiratory: Denies current dyspnea   OBJECTIVE: BP 124/80 (BP Location: Left Arm, Patient Position: Sitting, Cuff Size: Normal)   Pulse 63   Temp 97.9 F (36.6 C) (Oral)   Ht 5\' 6"  (1.676 m)   Wt 146 lb 8 oz (66.5 kg)   SpO2  98%   BMI 23.65 kg/m   Constitutional: -  VS reviewed -  Well developed, well nourished, appears stated age -  No apparent distress  Psychiatric: -  Oriented to person, place, and time -  Fluent conversation, good eye contact -  Judgment and insight age appropriate  Eye: -  Conjunctivae clear, no discharge -  Pupils symmetric, round, reactive to light  ENMT: -  MMM    Pharynx moist, no exudate, no erythema  Neck: -  No gross swelling, no palpable masses -  Thyroid midline, not enlarged, mobile, no palpable masses  Cardiovascular: -  RRR -  No apparent LE edema  Respiratory: -  Normal respiratory effort, no accessory muscle use, no retraction -  Breath sounds equal, no wheezes, no ronchi, no crackles  Gastrointestinal: -  Bowel sounds normal -  No tenderness, no distention, no guarding, no masses  Neurological:  -  CN II - XII grossly intact -  Sensation grossly intact to light touch, equal bilaterally  Musculoskeletal: -  No clubbing, no cyanosis -  +TTP over L rib 9-10 at ant ax line  Skin: -  No significant lesion on inspection -  Warm and dry to palpation   ASSESSMENT/PLAN: Atypical chest pain - Plan: Ambulatory referral to Cardiology  Need for influenza vaccination - Plan: Flu vaccine HIGH DOSE PF (Fluzone High dose)  Patient instructed to sign release of records form from her previous PCP. She has numerous issues it seems. I would like to have her rule out cardiac etiology first before starting to address her many medical problems. This pain is certainly atypical and most likely msk in etiology, but she is quite concerned about her heart.  Patient should return in 6 weeks to address some of her other concerns.  The patient voiced understanding and agreement to the plan.   [place], DO 03/01/17  4:47 PM

## 2017-03-01 NOTE — Progress Notes (Signed)
Pre visit review using our clinic review tool, if applicable. No additional management support is needed unless otherwise documented below in the visit note. 

## 2017-03-01 NOTE — Patient Instructions (Signed)
If you do not hear anything about your referral in the next 1-2 weeks, call our office and ask for an update.  Ice/cold pack over area for 10-15 min every 2-3 hours while awake.  Give me more time to review your records so I can come up with better plans for your future care.   Let us know if you need anything.

## 2017-03-02 ENCOUNTER — Encounter: Payer: Self-pay | Admitting: Family Medicine

## 2017-03-12 ENCOUNTER — Other Ambulatory Visit: Payer: Self-pay | Admitting: *Deleted

## 2017-03-12 DIAGNOSIS — E079 Disorder of thyroid, unspecified: Secondary | ICD-10-CM | POA: Insufficient documentation

## 2017-03-12 DIAGNOSIS — Z90710 Acquired absence of both cervix and uterus: Secondary | ICD-10-CM | POA: Insufficient documentation

## 2017-03-12 DIAGNOSIS — F332 Major depressive disorder, recurrent severe without psychotic features: Secondary | ICD-10-CM | POA: Diagnosis not present

## 2017-03-12 DIAGNOSIS — T7840XA Allergy, unspecified, initial encounter: Secondary | ICD-10-CM | POA: Insufficient documentation

## 2017-03-12 DIAGNOSIS — K635 Polyp of colon: Secondary | ICD-10-CM | POA: Insufficient documentation

## 2017-03-12 DIAGNOSIS — J45909 Unspecified asthma, uncomplicated: Secondary | ICD-10-CM | POA: Insufficient documentation

## 2017-03-12 DIAGNOSIS — C801 Malignant (primary) neoplasm, unspecified: Secondary | ICD-10-CM | POA: Insufficient documentation

## 2017-03-12 DIAGNOSIS — R32 Unspecified urinary incontinence: Secondary | ICD-10-CM | POA: Insufficient documentation

## 2017-03-12 DIAGNOSIS — K219 Gastro-esophageal reflux disease without esophagitis: Secondary | ICD-10-CM | POA: Insufficient documentation

## 2017-03-12 DIAGNOSIS — I10 Essential (primary) hypertension: Secondary | ICD-10-CM | POA: Insufficient documentation

## 2017-03-12 NOTE — Progress Notes (Signed)
Cardiology Office Note:    Date:  03/13/2017   ID:  Jenna Campbell, DOB [birth year], MRN [id number]  PCP:  Shelda Pal, DO  Cardiologist:  Shirlee More, MD   Referring MD: Shelda Pal*  ASSESSMENT:    1. Chest pain in adult    PLAN:    In order of problems listed above:  1. She is having what appears to be typical angina with symptoms occurring daily with relatively minor activity.  This is occurring in the setting of increased stress.  Asked her to undergo myocardial perfusion study feels incapable of doing a treadmill and a be performed pharmacologically.  Office in 4 weeks regarding decisions for medical therapy and high risk findings.  Because of a multitude of comorbidities I decided not to institute medical treatment prior to testing.  Next appointment 4 weeks   Medication Adjustments/Labs and Tests Ordered: Current medicines are reviewed at length with the patient today.  Concerns regarding medicines are outlined above.  Orders Placed This Encounter  Procedures  . Myocardial Perfusion Imaging  . EKG 12-Lead   No orders of the defined types were placed in this encounter.    Chief Complaint  Patient presents with  . Chest Pain  . Shortness of Breath  . Edema    History of Present Illness:    Jenna Campbell is a 66 y.o. female with hypertension and varicose veins of the lower extremity advised to have a MPI in April 2018 who is being seen today for the evaluation of chest pain at the request of Shelda Pal*. She has had more frequent episodes of exertional left chest aching moderate to severe miss of breath weakness and exercise intolerance.  She is under increased stress taking care for her significant other.  Her symptoms are exertional occur with activities such as light housework store leave that after resting lasting up to 30 minutes.  She underwent coronary angiography 25 years ago which was normal.  She is very anxious  apprehensive and wants to proceed with a stress test that was recommended 6 months ago.  She has had cough and wheezing and uses bronchodilators.  She has no orthopnea PND palpitation or syncope.  There is no pleuritic component to her chest pain no radiation no associated GI symptoms.    Past Medical History:  Diagnosis Date  . Acute bronchitis due to other specified organisms 04/21/2015  . Acute tension-type headache 12/16/2015  . Adjustment disorder with anxious mood 04/21/2015  . Allergic asthma with acute exacerbation 09/22/2014  . Allergic rhinitis due to pollen 07/23/2014  . Allergic urticaria 05/13/2015  . Allergy   . Alopecia 10/30/2013  . Arthritis 04/24/2016  . Asthma   . Atrophic vaginitis 10/30/2013  . Basal cell carcinoma 04/21/2015  . Bilateral impacted cerumen 04/21/2015  . Cancer ([place])    skin cancer  . Cervical radiculopathy 09/17/2015  . Change in bowel function 06/14/2016  . Chronic diarrhea 12/16/2015  . Colon polyps   . Cough 02/14/2016  . Cystocele, midline 10/30/2013  . Dermatitis 09/22/2014  . 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 Diverticulosis of large intestine 10/30/2013  . Dyspareunia 07/23/2014  . Dysuria 12/16/2015  . Encounter for long-term (current) use of other medications 10/19/2012  . Esophageal dysphagia 06/14/2016  . Essential hypertension   . Family history of pheochromocytoma 02/14/2016  . Gastroesophageal reflux disease without esophagitis 05/13/2015  . Generalized abdominal pain 12/16/2015  . GERD (gastroesophageal reflux disease)   . H/O: hysterectomy   .  Herpes simplex 08/17/2016  . History of adenomatous polyp of colon 06/14/2016  . History of blood transfusion 1970  . Hx of migraines 05/15/2009  . Hypothyroidism 10/30/2013  . Hypotonic bladder 10/30/2013  . Localized edema 12/16/2015  . Low back pain 09/17/2015  . Lumbar paraspinal muscle spasm 04/21/2015  . Major depression, recurrent ([place]) 07/14/2013  . Malaise and fatigue 04/21/2015  . MCI (mild cognitive  impairment) 08/18/2015  . Memory difficulty 09/17/2015  . Menopause 03/31/2014  . Midline low back pain without sciatica 03/31/2014  . Migraine   . Mild intermittent extrinsic asthma 12/16/2015  . Mild persistent asthma 05/13/2015  . Neck pain 09/17/2015  . Neuropathy 09/22/2014  . Nonintractable headache 11/02/2016  . Other allergic rhinitis 05/13/2015  . Pelvic pain in female 02/17/2015  . Personality disorder ([place]) 10/19/2012  . Pharyngoesophageal dysphagia 04/21/2015  . Plantar fasciitis 01/20/2016  . Precordial chest pain 08/18/2015  . Prolapse of vaginal vault after hysterectomy 10/30/2013  . Rectocele 10/30/2013  . Shin splint 04/21/2015  . Shortness of breath 07/17/2016  . Sinusitis, chronic 10/30/2013  . Slow transit constipation 07/23/2014  . Swelling of lower limb 05/17/2016  . Tension-type headache, not intractable 09/17/2015  . Thyroid disease   . Urethral stricture 10/30/2013  . Urinary incontinence   . Urinary incontinence without sensory awareness 07/17/2016  . Varicose veins of both lower extremities 04/24/2016  . Venous insufficiency (chronic) (peripheral) 06/06/2016    Past Surgical History:  Procedure Laterality Date  . MOHS SURGERY  10/05/2015  . RECTOCELE REPAIR  2011  . SKIN CANCER EXCISION  02/2015   Squamous cell removed from back  . TONSILLECTOMY    . VAGINAL HYSTERECTOMY    . VAGINAL PROLAPSE REPAIR      Current Medications: Current Meds  Medication Sig  . albuterol (PROAIR HFA) 108 (90 Base) MCG/ACT inhaler Inhale 2 puffs into the lungs every 4 (four) hours as needed for wheezing or shortness of breath.  Marland Kitchen albuterol (PROVENTIL) (2.5 MG/3ML) 0.083% nebulizer solution Take 2.5 mg by nebulization every 6 (six) hours as needed for wheezing or shortness of breath.  . beclomethasone (QVAR) 80 MCG/ACT inhaler Inhale 2 puffs into the lungs daily.  . betamethasone valerate ointment (VALISONE) 0.1 % Apply 1 application topically 2 (two) times daily.  . budesonide (PULMICORT  FLEXHALER) 180 MCG/ACT inhaler Inhale 2 puffs into the lungs daily.  . clobetasol cream (TEMOVATE) 6.44 % Apply 1 application topically 2 (two) times daily.  . ergocalciferol (VITAMIN D2) 50000 units capsule Take 50,000 Units by mouth once a week.  . fluticasone (FLONASE) 50 MCG/ACT nasal spray Place 1 spray into both nostrils daily.  Marland Kitchen gabapentin (NEURONTIN) 300 MG capsule Take 300 mg by mouth 2 (two) times daily.  Marland Kitchen levothyroxine (SYNTHROID, LEVOTHROID) 150 MCG tablet Take 150 mcg by mouth daily before breakfast.  . LORazepam (ATIVAN) 0.5 MG tablet Take 0.5 mg by mouth every 8 (eight) hours.  Marland Kitchen omeprazole (PRILOSEC) 40 MG capsule Take 40 mg by mouth daily.  . temazepam (RESTORIL) 30 MG capsule :take 1 cap at night for sleep (to replace 15mg  size)  . topiramate (TOPAMAX) 100 MG tablet Take 100 mg by mouth 2 (two) times daily.  . [DISCONTINUED] gabapentin (NEURONTIN) 300 MG capsule TAKE 1 CAPSULE BY MOUTH IN THE MORNING, 1 CAPSULE MIDDAY, AND 2 CAPSULES AT BEDTIME     Allergies:   Iodinated diagnostic agents; Kenalog [triamcinolone acetonide]; Metrizamide; Other; Penicillins; Diazepam; and Sulfa antibiotics   Social History  Social History  . Marital status: Divorced    Spouse name: N/A  . Number of children: N/A  . Years of education: N/A   Social History Main Topics  . Smoking status: Former Research scientist (life sciences)  . Smokeless tobacco: Never Used  . Alcohol use Yes  . Drug use: No  . Sexual activity: Yes    Partners: Male   Other Topics Concern  . None   Social History Narrative  . None     Family History: The patient's family history includes Alzheimer's disease in her mother; Angina in her father; Asthma in her father; Cancer in her sister; Emphysema in her father.  ROS:   Review of Systems  Constitution: Positive for weakness and malaise/fatigue.  HENT: Negative.   Eyes: Negative.   Cardiovascular: Positive for chest pain, dyspnea on exertion and leg swelling.  Respiratory:  Positive for cough, shortness of breath and wheezing.   Endocrine: Negative.   Skin: Negative.   Musculoskeletal: Positive for joint pain and myalgias.  Gastrointestinal: Negative.   Genitourinary: Negative.   Psychiatric/Behavioral: The patient is nervous/anxious.   Allergic/Immunologic: Negative.    Please see the history of present illness.      EKGs/Labs/Other Studies Reviewed:    The following studies were reviewed today:   EKG:  EKG is  ordered today.  The ekg ordered today demonstrates [place] normal  Recent Labs: No results found for requested labs within last 8760 hours.  Recent Lipid Panel Lab Results  Component Value Date  CHOL 206 (H) 04/21/2015  TRIG 240 (H) 04/21/2015  HDL 40 (L) 04/21/2015  LDL 118 (H) 04/21/2015    Physical Exam:    VS:  BP (!) 160/90 (BP Location: Left Arm, Patient Position: Sitting, Cuff Size: Normal)   Pulse 60   Ht 5\' 6"  (1.676 m)   Wt 147 lb (66.7 kg)   SpO2 98%   BMI 23.73 kg/m     Wt Readings from Last 3 Encounters:  03/13/17 147 lb (66.7 kg)  03/01/17 146 lb 8 oz (66.5 kg)  05/13/15 146 lb 6.2 oz (66.4 kg)     GEN: anxious  Well nourished, well developed in no acute distress HEENT: Normal NECK: No JVD; No carotid bruits LYMPHATICS: No lymphadenopathy CARDIAC: RRR, no murmurs, rubs, gallops RESPIRATORY:  Clear to auscultation without rales, wheezing or rhonchi  ABDOMEN: Soft, non-tender, non-distended MUSCULOSKELETAL:  No edema; No deformity  SKIN: Warm and dry NEUROLOGIC:  Alert and oriented x 3 PSYCHIATRIC:  Normal affect     Signed, Shirlee More, MD  03/13/2017 4:51 PM    [facility]

## 2017-03-13 ENCOUNTER — Other Ambulatory Visit: Payer: Self-pay | Admitting: Family Medicine

## 2017-03-13 ENCOUNTER — Encounter: Payer: Self-pay | Admitting: Cardiology

## 2017-03-13 ENCOUNTER — Ambulatory Visit (INDEPENDENT_AMBULATORY_CARE_PROVIDER_SITE_OTHER): Payer: Medicare Other | Admitting: Cardiology

## 2017-03-13 VITALS — BP 160/90 | HR 60 | Ht 66.0 in | Wt 147.0 lb

## 2017-03-13 DIAGNOSIS — R079 Chest pain, unspecified: Secondary | ICD-10-CM | POA: Diagnosis not present

## 2017-03-13 DIAGNOSIS — Z1231 Encounter for screening mammogram for malignant neoplasm of breast: Secondary | ICD-10-CM

## 2017-03-13 NOTE — Patient Instructions (Signed)
Medication Instructions:  Your physician recommends that you continue on your current medications as directed. Please refer to the Current Medication list given to you today.  Labwork: None  Testing/Procedures: You had an EKG today.  Your physician has requested that you have a lexiscan myoview. For further information please visit [email or web address]. Please follow instruction sheet, as given.  Follow-Up: Your physician recommends that you schedule a follow-up appointment in: 4 weeks.  Any Other Special Instructions Will Be Listed Below (If Applicable).     If you need a refill on your cardiac medications before your next appointment, please call your pharmacy.

## 2017-03-14 ENCOUNTER — Ambulatory Visit (HOSPITAL_BASED_OUTPATIENT_CLINIC_OR_DEPARTMENT_OTHER): Payer: Medicare Other

## 2017-03-14 ENCOUNTER — Telehealth (HOSPITAL_COMMUNITY): Payer: Self-pay | Admitting: *Deleted

## 2017-03-14 ENCOUNTER — Telehealth: Payer: Self-pay | Admitting: Cardiology

## 2017-03-14 DIAGNOSIS — F449 Dissociative and conversion disorder, unspecified: Secondary | ICD-10-CM | POA: Diagnosis not present

## 2017-03-14 DIAGNOSIS — F4322 Adjustment disorder with anxiety: Secondary | ICD-10-CM | POA: Diagnosis not present

## 2017-03-14 DIAGNOSIS — F330 Major depressive disorder, recurrent, mild: Secondary | ICD-10-CM

## 2017-03-14 HISTORY — DX: Major depressive disorder, recurrent, mild: F33.0

## 2017-03-14 NOTE — Telephone Encounter (Signed)
FYI

## 2017-03-14 NOTE — Telephone Encounter (Signed)
Patient given detailed instructions per Myocardial Perfusion Study Information Sheet for the test on 03/15/2017 at 10:00. Patient notified to arrive 15 minutes early and that it is imperative to arrive on time for appointment to keep from having the test rescheduled.  If you need to cancel or reschedule your appointment, please call the office within 24 hours of your appointment. . Patient verbalized understanding.Jenna Campbell

## 2017-03-14 NOTE — Telephone Encounter (Signed)
Says Dr Bettina Gavia asked her if she has any splints and she does have shin splints

## 2017-03-15 ENCOUNTER — Ambulatory Visit (HOSPITAL_COMMUNITY): Payer: Medicare Other | Attending: Cardiovascular Disease

## 2017-03-15 ENCOUNTER — Ambulatory Visit (HOSPITAL_BASED_OUTPATIENT_CLINIC_OR_DEPARTMENT_OTHER): Payer: Medicare Other

## 2017-03-15 DIAGNOSIS — R079 Chest pain, unspecified: Secondary | ICD-10-CM | POA: Diagnosis not present

## 2017-03-15 DIAGNOSIS — R110 Nausea: Secondary | ICD-10-CM

## 2017-03-15 LAB — MYOCARDIAL PERFUSION IMAGING
LV dias vol: 93 mL (ref 46–106)
LV sys vol: 41 mL
Peak HR: 97 {beats}/min
RATE: 0.3
Rest HR: 51 {beats}/min
SDS: 2
SRS: 4
SSS: 6
TID: 0.92

## 2017-03-15 MED ORDER — TECHNETIUM TC 99M SESTAMIBI GENERIC - CARDIOLITE
9.6000 | Freq: Once | INTRAVENOUS | Status: AC | PRN
  Administered 2017-03-15: 9.6 via INTRAVENOUS
  Filled 2017-03-15: qty 10

## 2017-03-15 MED ORDER — TECHNETIUM TC 99M TETROFOSMIN IV KIT
32.8000 | PACK | Freq: Once | INTRAVENOUS | Status: AC | PRN
  Administered 2017-03-15: 32.8 via INTRAVENOUS
  Filled 2017-03-15: qty 33

## 2017-03-15 MED ORDER — AMINOPHYLLINE 25 MG/ML IV SOLN
75.0000 mg | Freq: Once | INTRAVENOUS | Status: AC
  Administered 2017-03-15: 75 mg via INTRAVENOUS

## 2017-03-15 MED ORDER — REGADENOSON 0.4 MG/5ML IV SOLN
0.4000 mg | Freq: Once | INTRAVENOUS | Status: AC
  Administered 2017-03-15: 0.4 mg via INTRAVENOUS

## 2017-03-19 ENCOUNTER — Ambulatory Visit (HOSPITAL_BASED_OUTPATIENT_CLINIC_OR_DEPARTMENT_OTHER)
Admission: RE | Admit: 2017-03-19 | Discharge: 2017-03-19 | Disposition: A | Discharge status: Home / Self Care | Payer: Medicare Other | Source: Ambulatory Visit | Attending: Family Medicine | Admitting: Family Medicine

## 2017-03-19 ENCOUNTER — Encounter (HOSPITAL_BASED_OUTPATIENT_CLINIC_OR_DEPARTMENT_OTHER): Payer: Self-pay

## 2017-03-19 DIAGNOSIS — Z1231 Encounter for screening mammogram for malignant neoplasm of breast: Secondary | ICD-10-CM | POA: Diagnosis not present

## 2017-04-10 ENCOUNTER — Ambulatory Visit: Payer: Medicare Other | Admitting: Cardiology

## 2017-04-11 DIAGNOSIS — M5412 Radiculopathy, cervical region: Secondary | ICD-10-CM | POA: Diagnosis not present

## 2017-04-11 DIAGNOSIS — I2510 Atherosclerotic heart disease of native coronary artery without angina pectoris: Secondary | ICD-10-CM | POA: Diagnosis not present

## 2017-04-11 DIAGNOSIS — G44209 Tension-type headache, unspecified, not intractable: Secondary | ICD-10-CM | POA: Diagnosis not present

## 2017-04-12 ENCOUNTER — Ambulatory Visit (INDEPENDENT_AMBULATORY_CARE_PROVIDER_SITE_OTHER): Payer: Medicare Other | Admitting: Family Medicine

## 2017-04-12 ENCOUNTER — Encounter: Payer: Self-pay | Admitting: Family Medicine

## 2017-04-12 ENCOUNTER — Other Ambulatory Visit: Payer: Self-pay | Admitting: Family Medicine

## 2017-04-12 VITALS — BP 108/72 | HR 55 | Temp 97.7°F | Ht 66.0 in | Wt 146.4 lb

## 2017-04-12 DIAGNOSIS — E039 Hypothyroidism, unspecified: Secondary | ICD-10-CM | POA: Diagnosis not present

## 2017-04-12 DIAGNOSIS — E559 Vitamin D deficiency, unspecified: Secondary | ICD-10-CM | POA: Diagnosis not present

## 2017-04-12 LAB — VITAMIN D 25 HYDROXY (VIT D DEFICIENCY, FRACTURES): VITD: 71.77 ng/mL (ref 30.00–100.00)

## 2017-04-12 LAB — TSH: TSH: 3.11 u[IU]/mL (ref 0.35–4.50)

## 2017-04-12 LAB — T4, FREE: Free T4: 1.07 ng/dL (ref 0.60–1.60)

## 2017-04-12 MED ORDER — LEVOTHYROXINE SODIUM 150 MCG PO TABS: 225.0000 ug | ORAL_TABLET | Freq: Every day | ORAL | 3 refills | Status: DC

## 2017-04-12 NOTE — Progress Notes (Signed)
Pre visit review using our clinic review tool, if applicable. No additional management support is needed unless otherwise documented below in the visit note. 

## 2017-04-12 NOTE — Patient Instructions (Addendum)
I will send you a MyChart message regarding your lab results and further management of your thyroid and vitamin D.   Let us know if you need anything.

## 2017-04-12 NOTE — Progress Notes (Signed)
Chief Complaint  Patient presents with  . Follow-up    Subjective: Patient is a 66 y.o. female here for thyroid f/u.  Hypothyroidism Patient presents for follow-up of hypothyroidism.  Reports overall compliance with medication- sometimes forgets to take it 30 min before meals. Current symptoms include: none Denies: weight gain, feeling cold and cold intolerance, swelling, losing hair, anxiousness and weight loss She believes her dose should be unchanged  Hx of low Vit D, did not have a recheck. Insurance not paying for it now.   3 yr follow up for colonoscopy in 2020.   ROS: Heart: Denies chest pain  Lungs: Denies SOB    Past Medical History:  Diagnosis Date  . Adjustment disorder with anxious mood 04/21/2015  . Allergic asthma with acute exacerbation 09/22/2014  . Allergic rhinitis due to pollen 07/23/2014  . Alopecia 10/30/2013  . Arthritis 04/24/2016  . Atrophic vaginitis 10/30/2013  . Basal cell carcinoma 04/21/2015  . Cancer ([place])    skin cancer  . Cervical radiculopathy 09/17/2015  . Change in bowel function 06/14/2016  . Chronic diarrhea 12/16/2015  . Cystocele, midline 10/30/2013  . Dermatitis 09/22/2014  . Diverticulosis of large intestine 10/30/2013  . Dyspareunia 07/23/2014  . Essential hypertension   . Family history of pheochromocytoma 02/14/2016  . GERD (gastroesophageal reflux disease)   . Herpes simplex 08/17/2016  . History of adenomatous polyp of colon 06/14/2016  . History of blood transfusion 1970  . Hypothyroidism 10/30/2013  . Hypotonic bladder 10/30/2013  . Lumbar paraspinal muscle spasm 04/21/2015  . Major depression, recurrent ([place]) 07/14/2013  . Malaise and fatigue 04/21/2015  . MCI (mild cognitive impairment) 08/18/2015  . Menopause 03/31/2014  . Migraine   . Mild persistent asthma 05/13/2015  . Neuropathy 09/22/2014  . Personality disorder ([place]) 10/19/2012  . Pharyngoesophageal dysphagia 04/21/2015  . Plantar fasciitis 01/20/2016  . Precordial chest pain  08/18/2015  . Prolapse of vaginal vault after hysterectomy 10/30/2013  . Rectocele 10/30/2013  . Sinusitis, chronic 10/30/2013  . Slow transit constipation 07/23/2014  . Swelling of lower limb 05/17/2016  . Tension-type headache, not intractable 09/17/2015  . Urethral stricture 10/30/2013  . Urinary incontinence without sensory awareness 07/17/2016  . Varicose veins of both lower extremities 04/24/2016  . Venous insufficiency (chronic) (peripheral) 06/06/2016   Allergies  Allergen Reactions  . Iodinated Diagnostic Agents Itching, Other (See Comments), Rash and Shortness Of Breath    Throat Swelling, Erythema  . Kenalog [Triamcinolone Acetonide] Swelling  . Metrizamide Itching, Other (See Comments), Rash and Shortness Of Breath    Throat Swelling, Erythema  . Other Itching and Swelling    DUST  . Penicillins   . Diazepam Rash  . Sulfa Antibiotics Rash    Current Outpatient Medications:  .  albuterol (PROAIR HFA) 108 (90 Base) MCG/ACT inhaler, Inhale 2 puffs into the lungs every 4 (four) hours as needed for wheezing or shortness of breath., Disp: 1 Inhaler, Rfl: 3 .  albuterol (PROVENTIL) (2.5 MG/3ML) 0.083% nebulizer solution, Take 2.5 mg by nebulization every 6 (six) hours as needed for wheezing or shortness of breath., Disp: , Rfl:  .  beclomethasone (QVAR) 80 MCG/ACT inhaler, Inhale 2 puffs into the lungs daily., Disp: 1 Inhaler, Rfl: 5 .  betamethasone valerate ointment (VALISONE) 0.1 %, Apply 1 application topically 2 (two) times daily., Disp: , Rfl:  .  budesonide (PULMICORT FLEXHALER) 180 MCG/ACT inhaler, Inhale 2 puffs into the lungs daily., Disp: 1 Inhaler, Rfl: 5 .  clobetasol cream (TEMOVATE) 0.05 %,  Apply 1 application topically 2 (two) times daily., Disp: , Rfl:  .  ergocalciferol (VITAMIN D2) 50000 units capsule, Take 50,000 Units by mouth once a week., Disp: , Rfl:  .  fluticasone (FLONASE) 50 MCG/ACT nasal spray, Place 1 spray into both nostrils daily., Disp: , Rfl:  .  gabapentin  (NEURONTIN) 300 MG capsule, Take 300 mg by mouth 2 (two) times daily., Disp: , Rfl:  .  levothyroxine (SYNTHROID, LEVOTHROID) 150 MCG tablet, Take 150 mcg by mouth daily before breakfast., Disp: , Rfl:  .  LORazepam (ATIVAN) 0.5 MG tablet, Take 0.5 mg by mouth every 8 (eight) hours., Disp: , Rfl:  .  omeprazole (PRILOSEC) 40 MG capsule, Take 40 mg by mouth daily., Disp: , Rfl:  .  temazepam (RESTORIL) 30 MG capsule, :take 1 cap at night for sleep (to replace 15mg  size), Disp: , Rfl:  .  topiramate (TOPAMAX) 100 MG tablet, Take 100 mg by mouth 2 (two) times daily., Disp: , Rfl:   Objective: BP 108/72 (BP Location: Left Arm, Patient Position: Sitting, Cuff Size: Normal)   Pulse (!) 55   Temp 97.7 F (36.5 C) (Oral)   Ht 5\' 6"  (1.676 m)   Wt 146 lb 6 oz (66.4 kg)   SpO2 97%   BMI 23.63 kg/m  General: Awake, appears stated age HEENT: MMM, EOMi Heart: RRR, no murmurs Lungs: CTAB, no rales, wheezes or rhonchi. No accessory muscle use Neck: Supple, symmetric, no thyromegaly or nodules Psych: Age appropriate judgment and insight, normal affect and mood  Assessment and Plan: Vitamin D deficiency - Plan: Vitamin D (25 hydroxy)  Hypothyroidism, unspecified type - Plan: TSH, T4, free  Orders as above. The patient voiced understanding and agreement to the plan.  [place], DO 04/12/17  1:56 PM

## 2017-04-19 DIAGNOSIS — M50222 Other cervical disc displacement at C5-C6 level: Secondary | ICD-10-CM | POA: Diagnosis not present

## 2017-04-19 DIAGNOSIS — M50223 Other cervical disc displacement at C6-C7 level: Secondary | ICD-10-CM | POA: Diagnosis not present

## 2017-04-19 DIAGNOSIS — G44209 Tension-type headache, unspecified, not intractable: Secondary | ICD-10-CM | POA: Diagnosis not present

## 2017-04-26 ENCOUNTER — Ambulatory Visit: Payer: Medicare Other | Admitting: Cardiology

## 2017-05-02 NOTE — Progress Notes (Signed)
Cardiology Office Note:    Date:  05/03/2017   ID:  Jenna Campbell, DOB 12/28/1950, MRN [id number]  PCP:  Shelda Pal, DO  Cardiologist:  Shirlee More, MD    Referring MD: Shelda Pal*    ASSESSMENT:    1. Angina pectoris ([place])    PLAN:    In order of problems listed above:  1. Her myocardial perfusion study is normal placing her at low risk for myocardial infarction cardiac death.  Her symptoms are difficult to discern as there is an element of bronchospasm cough wheezing improvement bronchodilator and element of what sounds like chest wall pain as well as intermittent episodes of exertional anginal discomfort relieved with rest.  To treat a vasospastic component placed on long-acting calcium channel blocker and nitroglycerin as needed.  I saw to give her reassurance.  I will plan to see him back in the office in 3 months to assess her response and stressed the need with her up with compliance with her bronchodilators.   Next appointment: 3 months   Medication Adjustments/Labs and Tests Ordered: Current medicines are reviewed at length with the patient today.  Concerns regarding medicines are outlined above.  No orders of the defined types were placed in this encounter.  Meds ordered this encounter  Medications  . diltiazem (CARDIZEM CD) 180 MG 24 hr capsule    Sig: Take 1 capsule (180 mg total) by mouth daily.    Dispense:  25 capsule    Refill:  1  . nitroGLYCERIN (NITROSTAT) 0.4 MG SL tablet    Sig: Place 1 tablet (0.4 mg total) under the tongue every 5 (five) minutes as needed for chest pain.    Dispense:  90 tablet    Refill:  3    Chief Complaint  Patient presents with  . Follow-up    nuclear stress test    History of Present Illness:    Jenna Campbell is a 66 y.o. female with a hx of chest pain ,asthma and hypertension last seen 2 months ago. Compliance with diet, lifestyle and medications: Yes She is relieved with the results of the  myocardial perfusion study but continues to have chest pain that is multifactorial.  Some of it is typical related to her asthma improved with bronchodilators some is a tenderness in the left chest wall and some of it sounds like typical exertional angina relieved with rest. Past Medical History:  Diagnosis Date  . Adjustment disorder with anxious mood 04/21/2015  . Allergic asthma with acute exacerbation 09/22/2014  . Allergic rhinitis due to pollen 07/23/2014  . Alopecia 10/30/2013  . Arthritis 04/24/2016  . Atrophic vaginitis 10/30/2013  . Basal cell carcinoma 04/21/2015  . Cancer ([place])    skin cancer  . Cervical radiculopathy 09/17/2015  . Change in bowel function 06/14/2016  . Chronic diarrhea 12/16/2015  . Cystocele, midline 10/30/2013  . Dermatitis 09/22/2014  . Diverticulosis of large intestine 10/30/2013  . Dyspareunia 07/23/2014  . Essential hypertension   . Family history of pheochromocytoma 02/14/2016  . GERD (gastroesophageal reflux disease)   . Herpes simplex 08/17/2016  . History of adenomatous polyp of colon 06/14/2016  . History of blood transfusion 1970  . Hypothyroidism 10/30/2013  . Hypotonic bladder 10/30/2013  . Lumbar paraspinal muscle spasm 04/21/2015  . Major depression, recurrent ([place]) 07/14/2013  . Malaise and fatigue 04/21/2015  . MCI (mild cognitive impairment) 08/18/2015  . Menopause 03/31/2014  . Migraine   . Mild persistent asthma 05/13/2015  .  Neuropathy 09/22/2014  . Personality disorder ([place]) 10/19/2012  . Pharyngoesophageal dysphagia 04/21/2015  . Plantar fasciitis 01/20/2016  . Precordial chest pain 08/18/2015  . Prolapse of vaginal vault after hysterectomy 10/30/2013  . Rectocele 10/30/2013  . Sinusitis, chronic 10/30/2013  . Slow transit constipation 07/23/2014  . Swelling of lower limb 05/17/2016  . Tension-type headache, not intractable 09/17/2015  . Urethral stricture 10/30/2013  . Urinary incontinence without sensory awareness 07/17/2016  . Varicose veins of both lower  extremities 04/24/2016  . Venous insufficiency (chronic) (peripheral) 06/06/2016    Past Surgical History:  Procedure Laterality Date  . BREAST CYST ASPIRATION Left    25 years ago   . MOHS SURGERY  10/05/2015  . RECTOCELE REPAIR  2011  . SKIN CANCER EXCISION  02/2015   Squamous cell removed from back  . TONSILLECTOMY    . VAGINAL HYSTERECTOMY    . VAGINAL PROLAPSE REPAIR      Current Medications: Current Meds  Medication Sig  . albuterol (PROAIR HFA) 108 (90 Base) MCG/ACT inhaler Inhale 2 puffs into the lungs every 4 (four) hours as needed for wheezing or shortness of breath.  Marland Kitchen albuterol (PROVENTIL) (2.5 MG/3ML) 0.083% nebulizer solution Take 2.5 mg by nebulization every 6 (six) hours as needed for wheezing or shortness of breath.  . beclomethasone (QVAR) 80 MCG/ACT inhaler Inhale 2 puffs into the lungs daily.  . betamethasone valerate ointment (VALISONE) 0.1 % Apply 1 application topically 2 (two) times daily.  . budesonide (PULMICORT FLEXHALER) 180 MCG/ACT inhaler Inhale 2 puffs into the lungs daily.  . fluticasone (FLONASE) 50 MCG/ACT nasal spray Place 1 spray into both nostrils daily.  Marland Kitchen gabapentin (NEURONTIN) 300 MG capsule Take 300 mg by mouth 2 (two) times daily.  Marland Kitchen levothyroxine (SYNTHROID, LEVOTHROID) 150 MCG tablet Take 1.5 tablets (225 mcg total) by mouth daily before breakfast.  . LORazepam (ATIVAN) 0.5 MG tablet Take 0.5 mg by mouth every 8 (eight) hours.  Marland Kitchen omeprazole (PRILOSEC) 40 MG capsule Take 40 mg by mouth daily.  . temazepam (RESTORIL) 30 MG capsule :take 1 cap at night for sleep (to replace 15mg  size)  . topiramate (TOPAMAX) 100 MG tablet Take 100 mg by mouth 2 (two) times daily.     Allergies:   Iodinated diagnostic agents; Kenalog [triamcinolone acetonide]; Metrizamide; Other; Penicillins; Diazepam; and Sulfa antibiotics   Social History   Socioeconomic History  . Marital status: Divorced    Spouse name: Not on file  . Number of children: Not on file    . Years of education: Not on file  . Highest education level: Not on file  Social Needs  . Financial resource strain: Not on file  . Food insecurity - worry: Not on file  . Food insecurity - inability: Not on file  . Transportation needs - medical: Not on file  . Transportation needs - non-medical: Not on file  Occupational History  . Not on file  Tobacco Use  . Smoking status: Former Research scientist (life sciences)  . Smokeless tobacco: Never Used  Substance and Sexual Activity  . Alcohol use: Yes  . Drug use: No  . Sexual activity: Yes    Partners: Male  Other Topics Concern  . Not on file  Social History Narrative  . Not on file     Family History: The patient's family history includes Alzheimer's disease in her mother; Angina in her father; Asthma in her father; Cancer in her sister; Emphysema in her father. ROS:   Please see the history  of present illness.    All other systems reviewed and are negative.  EKGs/Labs/Other Studies Reviewed:    The following studies were reviewed  MPI: Lexiscan Overall Study Impression Probable normal perfusion and soft tissue attenuation (breast) No ischemia This is a low risk study. Overall left ventricular systolic function was normal. LV cavity size is normal. Nuclear stress EF: 56%.    Recent Labs: 04/12/2017: TSH 3.11  Recent Lipid Panel No results found for: CHOL, TRIG, HDL, CHOLHDL, VLDL, LDLCALC, LDLDIRECT  Physical Exam:    VS:  BP 130/80 (BP Location: Right Arm, Patient Position: Sitting)   Pulse 63   Ht 5\' 6"  (1.676 m)   Wt 143 lb (64.9 kg)   SpO2 97%   BMI 23.08 kg/m     Wt Readings from Last 3 Encounters:  05/03/17 143 lb (64.9 kg)  04/12/17 146 lb 6 oz (66.4 kg)  03/13/17 147 lb (66.7 kg)     GEN:  Well nourished, well developed in no acute distress HEENT: Normal NECK: No JVD; No carotid bruits LYMPHATICS: No lymphadenopathy CARDIAC: Left chest wall tenderness RRR, no murmurs, rubs, gallops RESPIRATORY:  Clear to auscultation  without rales, wheezing or rhonchi  ABDOMEN: Soft, non-tender, non-distended MUSCULOSKELETAL:  No edema; No deformity  SKIN: Warm and dry NEUROLOGIC:  Alert and oriented x 3 PSYCHIATRIC:  Normal affect    Signed, Shirlee More, MD  05/03/2017 12:54 PM    [place]

## 2017-05-03 ENCOUNTER — Ambulatory Visit (INDEPENDENT_AMBULATORY_CARE_PROVIDER_SITE_OTHER): Payer: Medicare Other | Admitting: Cardiology

## 2017-05-03 VITALS — BP 130/80 | HR 63 | Ht 66.0 in | Wt 143.0 lb

## 2017-05-03 DIAGNOSIS — I209 Angina pectoris, unspecified: Secondary | ICD-10-CM

## 2017-05-03 MED ORDER — NITROGLYCERIN 0.4 MG SL SUBL
0.4000 mg | SUBLINGUAL_TABLET | SUBLINGUAL | 3 refills | Status: DC | PRN
Start: 2017-05-03 — End: 2017-08-06

## 2017-05-03 MED ORDER — DILTIAZEM HCL ER COATED BEADS 180 MG PO CP24: 180.0000 mg | ORAL_CAPSULE | Freq: Every day | ORAL | 1 refills | Status: DC

## 2017-05-03 NOTE — Patient Instructions (Addendum)
Medication Instructions:  Your physician has recommended you make the following change in your medication:  START diltiazem (Cardizem) 180 mg daily START nitroglycerin 0.4 mg sublingual (under your tongue) every 5 minutes as needed for chest pain. When having chest pain, stop what you are doing and sit down. Take 1 nitro, wait 5 minutes. Still having chest pain, take 1 nitro, wait 5 minutes. Still having chest pain, take 1 nitro, dial 911. Total of 3 nitro in 15 minutes.  Labwork: None  Testing/Procedures: None  Follow-Up: Your physician recommends that you schedule a follow-up appointment in: 3 months.  Any Other Special Instructions Will Be Listed Below (If Applicable).     If you need a refill on your cardiac medications before your next appointment, please call your pharmacy.    Angina Pectoris Angina pectoris is a very bad feeling in the chest, neck, or arm. Your doctor may call it angina. There are four types of angina. Angina is caused by a lack of blood in the middle and thickest layer of the heart wall (myocardium). Angina may feel like a crushing or squeezing pain in the chest. It may feel like tightness or heavy pressure in the chest. Some people say it feels like gas, heartburn, or indigestion. Some people have symptoms other than pain. These include:  Shortness of breath.  Cold sweats.  Feeling sick to your stomach (nausea).  Feeling light-headed.  Many women have chest discomfort and some of the other symptoms. However, women often have different symptoms, such as:  Feeling tired (fatigue).  Feeling nervous for no reason.  Feeling weak for no reason.  Dizziness or fainting.  Women may have angina without any symptoms. Follow these instructions at home:  Take medicines only as told by your doctor.  Take care of other health issues as told by your doctor. These include: ? High blood pressure (hypertension). ? Diabetes.  Follow a heart-healthy diet.  Your doctor can help you to choose healthy food options and make changes.  Talk to your doctor to learn more about healthy cooking methods and use them. These include: ? Roasting. ? Grilling. ? Broiling. ? Baking. ? Poaching. ? Steaming. ? Stir-frying.  Follow an exercise program approved by your doctor.  Keep a healthy weight. Lose weight as told by your doctor.  Rest when you are tired.  Learn to manage stress.  Do not use any tobacco, such as cigarettes, chewing tobacco, or electronic cigarettes. If you need help quitting, ask your doctor.  If you drink alcohol, and your doctor says it is okay, limit yourself to no more than 1 drink per day. One drink equals 12 ounces of beer, 5 ounces of wine, or 1 ounces of hard liquor.  Stop illegal drug use.  Keep all follow-up visits as told by your doctor. This is important. Do not take these medicines unless your doctor says that you can:  Nonsteroidal anti-inflammatory drugs (NSAIDs). These include: ? Ibuprofen. ? Naproxen. ? Celecoxib.  Vitamin supplements that have vitamin A, vitamin E, or both.  Hormone therapy that contains estrogen with or without progestin.  Get help right away if:  You have pain in your chest, neck, arm, jaw, stomach, or back that: ? Lasts more than a few minutes. ? Comes back. ? Does not get better after you take medicine under your tongue (sublingual nitroglycerin).  You have any of these symptoms for no reason: ? Gas, heartburn, or indigestion. ? Sweating a lot. ? Shortness of breath or  trouble breathing. ? Feeling sick to your stomach or throwing up. ? Feeling more tired than usual. ? Feeling nervous or worrying more than usual. ? Feeling weak. ? Diarrhea.  You are suddenly dizzy or light-headed.  You faint or pass out. These symptoms may be an emergency. Do not wait to see if the symptoms will go away. Get medical help right away. Call your local emergency services (911 in the [place] Do  not drive yourself to the hospital. This information is not intended to replace advice given to you by your health care provider. Make sure you discuss any questions you have with your health care provider. Document Released: 10/18/2007 Document Revised: 10/07/2015 Document Reviewed: 09/02/2013 Elsevier Interactive Patient Education  2017 [employer].

## 2017-05-16 ENCOUNTER — Telehealth: Payer: Self-pay | Admitting: Cardiology

## 2017-05-16 NOTE — Telephone Encounter (Signed)
Patient advised no need to go to emergency room unless she takes 3 nitro. Patient verbalized understanding. Patient states that he blood pressure has been elevated. Advised patient to check blood pressure 1 hour after taking medications and document. Patient verbalized understanding. No further questions.

## 2017-05-16 NOTE — Telephone Encounter (Signed)
Patient's daughter Patrici Ranks called and would like to discuss the diagnosis that Dr. Bettina Gavia has determined on her mother.

## 2017-05-16 NOTE — Telephone Encounter (Signed)
Pt called stating that she took 2 nitros yesterday but did not go to ER because she felt better

## 2017-05-16 NOTE — Telephone Encounter (Signed)
Jenna Campbell advised that patient has told her grandchildren that she is finalizing her will and that she has been diagnosed with Angina. Discussed with Jenna Campbell that angina is a type of chest pain. Advised that patient was started on nitroglycerin as needed for chest pain as well as diltiazem. Jenna Campbell that patient's stress test was considered to be normal and she is at low risk for cardiac death. Jenna Campbell verbalized understanding and wanted to understand the cardiac issues better. Jenna Campbell states that patient sees psychology and psychiatry and she is working with them to determine the best way to help her mother cope. Jenna Campbell had no further questions at this time.

## 2017-05-17 DIAGNOSIS — F332 Major depressive disorder, recurrent severe without psychotic features: Secondary | ICD-10-CM | POA: Diagnosis not present

## 2017-05-21 DIAGNOSIS — F4322 Adjustment disorder with anxiety: Secondary | ICD-10-CM | POA: Diagnosis not present

## 2017-05-21 DIAGNOSIS — M5412 Radiculopathy, cervical region: Secondary | ICD-10-CM | POA: Diagnosis not present

## 2017-05-21 DIAGNOSIS — M542 Cervicalgia: Secondary | ICD-10-CM | POA: Diagnosis not present

## 2017-05-21 DIAGNOSIS — G44229 Chronic tension-type headache, not intractable: Secondary | ICD-10-CM | POA: Diagnosis not present

## 2017-05-28 ENCOUNTER — Ambulatory Visit (INDEPENDENT_AMBULATORY_CARE_PROVIDER_SITE_OTHER): Payer: PPO | Admitting: Family Medicine

## 2017-05-28 ENCOUNTER — Encounter: Payer: Self-pay | Admitting: Family Medicine

## 2017-05-28 VITALS — BP 122/80 | HR 62 | Temp 97.7°F | Ht 66.0 in | Wt 145.5 lb

## 2017-05-28 DIAGNOSIS — R079 Chest pain, unspecified: Secondary | ICD-10-CM | POA: Insufficient documentation

## 2017-05-28 DIAGNOSIS — J069 Acute upper respiratory infection, unspecified: Secondary | ICD-10-CM | POA: Diagnosis not present

## 2017-05-28 DIAGNOSIS — B9789 Other viral agents as the cause of diseases classified elsewhere: Secondary | ICD-10-CM

## 2017-05-28 DIAGNOSIS — S161XXA Strain of muscle, fascia and tendon at neck level, initial encounter: Secondary | ICD-10-CM

## 2017-05-28 DIAGNOSIS — I209 Angina pectoris, unspecified: Secondary | ICD-10-CM

## 2017-05-28 HISTORY — DX: Chest pain, unspecified: R07.9

## 2017-05-28 MED ORDER — ALBUTEROL SULFATE (2.5 MG/3ML) 0.083% IN NEBU: 2.5000 mg | INHALATION_SOLUTION | Freq: Four times a day (QID) | RESPIRATORY_TRACT | 0 refills | Status: DC | PRN

## 2017-05-28 MED ORDER — BENZONATATE 100 MG PO CAPS: 100.0000 mg | ORAL_CAPSULE | Freq: Three times a day (TID) | ORAL | 0 refills | Status: DC | PRN

## 2017-05-28 MED ORDER — FLUTICASONE PROPIONATE HFA 220 MCG/ACT IN AERO: 1.0000 | INHALATION_SPRAY | Freq: Two times a day (BID) | RESPIRATORY_TRACT | 12 refills | Status: DC

## 2017-05-28 MED ORDER — TIZANIDINE HCL 2 MG PO TABS: 2.0000 mg | ORAL_TABLET | Freq: Three times a day (TID) | ORAL | 0 refills | Status: DC | PRN

## 2017-05-28 NOTE — Progress Notes (Signed)
Chief Complaint  Patient presents with  . Ear Pain    congestion  . Cough    [name] here for URI complaints.  Duration: 1 week  Associated symptoms: sinus congestion, ear pain and dry cough Denies: sinus pain, rhinorrhea, itchy watery eyes, ear drainage, sore throat, wheezing, shortness of breath, myalgia and fevers/rigors Treatment to date: Cough syrup OTC Sick contacts: No   Hx of CP, sees cards. Has been having some CP. Has not taken a nitro. Not a/w exertion.   ROS:  Const: Denies fevers  HEENT: As noted in HPI Lungs: No SOB  Past Medical History:  Diagnosis Date  . Adjustment disorder with anxious mood 04/21/2015  . Allergic asthma with acute exacerbation 09/22/2014  . Allergic rhinitis due to pollen 07/23/2014  . Alopecia 10/30/2013  . Arthritis 04/24/2016  . Atrophic vaginitis 10/30/2013  . Basal cell carcinoma 04/21/2015  . Cancer ([place])    skin cancer  . Cervical radiculopathy 09/17/2015  . Change in bowel function 06/14/2016  . Chronic diarrhea 12/16/2015  . Cystocele, midline 10/30/2013  . Dermatitis 09/22/2014  . Diverticulosis of large intestine 10/30/2013  . Dyspareunia 07/23/2014  . Essential hypertension   . Family history of pheochromocytoma 02/14/2016  . GERD (gastroesophageal reflux disease)   . Herpes simplex 08/17/2016  . History of adenomatous polyp of colon 06/14/2016  . History of blood transfusion 1970  . Hypothyroidism 10/30/2013  . Hypotonic bladder 10/30/2013  . Lumbar paraspinal muscle spasm 04/21/2015  . Major depression, recurrent ([place]) 07/14/2013  . Malaise and fatigue 04/21/2015  . MCI (mild cognitive impairment) 08/18/2015  . Menopause 03/31/2014  . Migraine   . Mild persistent asthma 05/13/2015  . Neuropathy 09/22/2014  . Personality disorder ([place]) 10/19/2012  . Pharyngoesophageal dysphagia 04/21/2015  . Plantar fasciitis 01/20/2016  . Precordial chest pain 08/18/2015  . Prolapse of vaginal vault after hysterectomy 10/30/2013  . Rectocele 10/30/2013   . Sinusitis, chronic 10/30/2013  . Slow transit constipation 07/23/2014  . Swelling of lower limb 05/17/2016  . Tension-type headache, not intractable 09/17/2015  . Urethral stricture 10/30/2013  . Urinary incontinence without sensory awareness 07/17/2016  . Varicose veins of both lower extremities 04/24/2016  . Venous insufficiency (chronic) (peripheral) 06/06/2016   Family History  Problem Relation Age of Onset  . Asthma Father   . Angina Father   . Emphysema Father   . Alzheimer's disease Mother   . Cancer Sister        died of cancer   Allergies  Allergen Reactions  . Iodinated Diagnostic Agents Itching, Other (See Comments), Rash and Shortness Of Breath    Throat Swelling, Erythema  . Kenalog [Triamcinolone Acetonide] Swelling  . Metrizamide Itching, Other (See Comments), Rash and Shortness Of Breath    Throat Swelling, Erythema  . Other Itching and Swelling    DUST  . Penicillins   . Diazepam Rash  . Sulfa Antibiotics Rash   Current Outpatient Medications on File Prior to Visit  Medication Sig Dispense Refill  . albuterol (PROAIR HFA) 108 (90 Base) MCG/ACT inhaler Inhale 2 puffs into the lungs every 4 (four) hours as needed for wheezing or shortness of breath. 1 Inhaler 3  . diltiazem (CARDIZEM CD) 180 MG 24 hr capsule Take 1 capsule (180 mg total) by mouth daily. 25 capsule 1  . fluticasone (FLONASE) 50 MCG/ACT nasal spray Place 1 spray into both nostrils daily.    Marland Kitchen gabapentin (NEURONTIN) 300 MG capsule Take 300 mg by mouth 2 (two)  times daily.    Marland Kitchen levothyroxine (SYNTHROID, LEVOTHROID) 150 MCG tablet Take 1.5 tablets (225 mcg total) by mouth daily before breakfast. 135 tablet 3  . LORazepam (ATIVAN) 0.5 MG tablet Take 0.5 mg by mouth every 8 (eight) hours.    . nitroGLYCERIN (NITROSTAT) 0.4 MG SL tablet Place 1 tablet (0.4 mg total) under the tongue every 5 (five) minutes as needed for chest pain. 90 tablet 3  . omeprazole (PRILOSEC) 40 MG capsule Take 40 mg by mouth daily.     . temazepam (RESTORIL) 30 MG capsule :take 1 cap at night for sleep (to replace 15mg  size)    . topiramate (TOPAMAX) 100 MG tablet Take 100 mg by mouth 2 (two) times daily.    . betamethasone valerate ointment (VALISONE) 0.1 % Apply 1 application topically 2 (two) times daily.     BP 122/80 (BP Location: Left Arm, Patient Position: Sitting, Cuff Size: Normal)   Pulse 62   Temp 97.7 F (36.5 C) (Oral)   Ht 5\' 6"  (1.676 m)   Wt 145 lb 8 oz (66 kg)   SpO2 99%   BMI 23.48 kg/m  General: Awake, alert, appears stated age HEENT: AT, [place], ears patent b/l and TM's neg, nares patent w/o discharge, pharynx pink and without exudates, MMM Neck: No masses or asymmetry Heart: RRR Lungs: CTAB, no accessory muscle use MSK: 5/5 strength throughout UE's, +TTP over TMJ and prox SCM Neuro: DTR's equal and symmetric in UE's Psych: Age appropriate judgment and insight, normal mood and affect  Viral URI with cough - Plan: benzonatate (TESSALON) 100 MG capsule  Angina pectoris ([facility])  Strain of neck muscle, initial encounter - Plan: tiZANidine (ZANAFLEX) 2 MG tablet  Orders as above. Tx supportive. MSK pain likely 2/2 coughing. I don't think she is having an acute coronary event. If having CP that does not resolve with 3 nitro, seek care or let Dr. Bettina Gavia know if things are worsening.  Continue to push fluids, practice good hand hygiene, cover mouth when coughing. F/u prn. If starting to experience fevers, shaking, or shortness of breath, seek immediate care. Pt voiced understanding and agreement to the plan.  [place], DO 05/28/17 5:03 PM

## 2017-05-28 NOTE — Patient Instructions (Addendum)
If you take 3 nitro, 5 min apart, and you are still having chest pain, go to the ER.   OK to take Tylenol 1000 mg (2 extra strength tabs) or 975 mg (3 regular strength tabs) every 6 hours as needed.  Continue to push fluids, practice good hand hygiene, and cover your mouth if you cough.  If you start having fevers, shaking or shortness of breath, seek immediate care.  Heat (pad or rice pillow in microwave) over affected area, 10-15 minutes every 2-3 hours while awake.   Let us know if you need anything.

## 2017-05-28 NOTE — Progress Notes (Signed)
Pre visit review using our clinic review tool, if applicable. No additional management support is needed unless otherwise documented below in the visit note. 

## 2017-05-29 ENCOUNTER — Telehealth: Payer: Self-pay | Admitting: Family Medicine

## 2017-05-29 NOTE — Telephone Encounter (Signed)
Copied from [employer] [id number]. Topic: Quick Communication - See Telephone Encounter >> May 29, 2017 12:54 PM Antonieta Iba C wrote: CRM for notification. See Telephone encounter for: pt says that she was seen yesterday and prescribed fluticasone, pt says that after speaking with insurance inhaler will cost 45.00. Pt would like to know if provider is able to suggest a different type of inhaler? Pt says that insurance stated that all bronchiolar  inhalers will cost her 45.00 or more.    Please advise.   CB: [phone]  05/29/17.

## 2017-05-29 NOTE — Telephone Encounter (Signed)
Do we have any payment aids for Flovent, Pulmicort/Rhinocort, Asthmanex, Qvar, Azmacort, or Arnuity?

## 2017-05-29 NOTE — Telephone Encounter (Signed)
I have one for asmanex HFA and asmanex twisthaler - the coupon can be used one time. Also have Dymista/Aerospan savings card

## 2017-05-30 MED ORDER — MOMETASONE FUROATE 100 MCG/ACT IN AERO: 100.0000 ug | INHALATION_SPRAY | Freq: Two times a day (BID) | RESPIRATORY_TRACT | 1 refills | Status: DC

## 2017-05-30 NOTE — Addendum Note (Signed)
Addended by: Sharon Seller B on: 05/30/2017 08:49 AM   Modules accepted: Orders

## 2017-05-30 NOTE — Telephone Encounter (Signed)
Called the patient to inform we have a voucher for Asmanex HFA she will pickup. asmanex sent in

## 2017-05-30 NOTE — Telephone Encounter (Signed)
Let's give her the Asthmanex HFA. TY.

## 2017-06-22 DIAGNOSIS — F332 Major depressive disorder, recurrent severe without psychotic features: Secondary | ICD-10-CM | POA: Diagnosis not present

## 2017-06-27 NOTE — Progress Notes (Signed)
Subjective:   Jenna Campbell is a 67 y.o. female who presents for an Initial Medicare Annual Wellness Visit.  Review of Systems   No ROS.  Medicare Wellness Visit. Additional risk factors are reflected in the social history. Cardiac Risk Factors include: advanced age (>92men, >42 women);hypertension;sedentary lifestyle Sleep patterns: Restoril nightly. Sleeps 7-9 hrs per night.   Home Safety/Smoke Alarms: Feels safe in home. Smoke alarms in place.  Living environment; residence and Adult nurse: lives with Ron (boyfriend).  Seat Belt Safety/Bike Helmet: Wears seat belt.   Female:   Pap-  Pt wants to discuss with PCP first.     Mammo- last 03/19/17: normal       Dexa scan- Last 09/27/15 -osteopenia CCS- last 05/01/16. Recall 3 yrs     Objective:    Today's Vitals   07/05/17 1445  BP: (!) 141/76  Pulse: (!) 57  SpO2: 98%  Weight: 146 lb (66.2 kg)  Height: 5\' 6"  (1.676 m)   Body mass index is 23.57 kg/m.  No flowsheet data found.  Current Medications (verified) Outpatient Encounter Medications as of 07/05/2017  Medication Sig  . albuterol (PROAIR HFA) 108 (90 Base) MCG/ACT inhaler Inhale 2 puffs into the lungs every 4 (four) hours as needed for wheezing or shortness of breath.  Marland Kitchen albuterol (PROVENTIL) (2.5 MG/3ML) 0.083% nebulizer solution Take 3 mLs (2.5 mg total) by nebulization every 6 (six) hours as needed for wheezing or shortness of breath.  . benzonatate (TESSALON) 100 MG capsule Take 1 capsule (100 mg total) by mouth 3 (three) times daily as needed.  . betamethasone valerate ointment (VALISONE) 0.1 % Apply 1 application topically 2 (two) times daily.  . fluticasone (FLONASE) 50 MCG/ACT nasal spray Place 1 spray into both nostrils daily.  . fluticasone (FLOVENT HFA) 220 MCG/ACT inhaler Inhale 1 puff into the lungs 2 (two) times daily.  Marland Kitchen gabapentin (NEURONTIN) 300 MG capsule Take 300 mg by mouth 2 (two) times daily.  Marland Kitchen levothyroxine (SYNTHROID, LEVOTHROID) 150 MCG  tablet Take 1.5 tablets (225 mcg total) by mouth daily before breakfast.  . LORazepam (ATIVAN) 0.5 MG tablet Take 0.5 mg by mouth every 8 (eight) hours as needed.   . Mometasone Furoate (ASMANEX HFA) 100 MCG/ACT AERO Inhale 100 mcg into the lungs 2 (two) times daily.  . nitroGLYCERIN (NITROSTAT) 0.4 MG SL tablet Place 1 tablet (0.4 mg total) under the tongue every 5 (five) minutes as needed for chest pain.  Marland Kitchen omeprazole (PRILOSEC) 40 MG capsule Take 40 mg by mouth daily.  . temazepam (RESTORIL) 30 MG capsule :take 1 cap at night for sleep (to replace 15mg  size)  . tiZANidine (ZANAFLEX) 2 MG tablet Take 1 tablet (2 mg total) by mouth every 8 (eight) hours as needed for muscle spasms.  Marland Kitchen topiramate (TOPAMAX) 100 MG tablet Take 100 mg by mouth 2 (two) times daily.  Marland Kitchen diltiazem (CARDIZEM CD) 180 MG 24 hr capsule Take 1 capsule (180 mg total) by mouth daily. (Patient not taking: Reported on 07/05/2017)   No facility-administered encounter medications on file as of 07/05/2017.     Allergies (verified) Iodinated diagnostic agents; Kenalog [triamcinolone acetonide]; Metrizamide; Other; Penicillins; Diazepam; and Sulfa antibiotics   History: Past Medical History:  Diagnosis Date  . Adjustment disorder with anxious mood 04/21/2015  . Allergic asthma with acute exacerbation 09/22/2014  . Allergic rhinitis due to pollen 07/23/2014  . Alopecia 10/30/2013  . Arthritis 04/24/2016  . Atrophic vaginitis 10/30/2013  . Basal cell carcinoma 04/21/2015  .  Cancer ([place])    skin cancer  . Cervical radiculopathy 09/17/2015  . Change in bowel function 06/14/2016  . Chronic diarrhea 12/16/2015  . Cystocele, midline 10/30/2013  . Dermatitis 09/22/2014  . Diverticulosis of large intestine 10/30/2013  . Dyspareunia 07/23/2014  . Essential hypertension   . Family history of pheochromocytoma 02/14/2016  . GERD (gastroesophageal reflux disease)   . Herpes simplex 08/17/2016  . History of adenomatous polyp of colon 06/14/2016  .  History of blood transfusion 1970  . Hypothyroidism 10/30/2013  . Hypotonic bladder 10/30/2013  . Lumbar paraspinal muscle spasm 04/21/2015  . Major depression, recurrent ([place]) 07/14/2013  . Malaise and fatigue 04/21/2015  . MCI (mild cognitive impairment) 08/18/2015  . Menopause 03/31/2014  . Migraine   . Mild persistent asthma 05/13/2015  . Neuropathy 09/22/2014  . Personality disorder ([place]) 10/19/2012  . Pharyngoesophageal dysphagia 04/21/2015  . Plantar fasciitis 01/20/2016  . Precordial chest pain 08/18/2015  . Prolapse of vaginal vault after hysterectomy 10/30/2013  . Rectocele 10/30/2013  . Sinusitis, chronic 10/30/2013  . Slow transit constipation 07/23/2014  . Swelling of lower limb 05/17/2016  . Tension-type headache, not intractable 09/17/2015  . Urethral stricture 10/30/2013  . Urinary incontinence without sensory awareness 07/17/2016  . Varicose veins of both lower extremities 04/24/2016  . Venous insufficiency (chronic) (peripheral) 06/06/2016   Past Surgical History:  Procedure Laterality Date  . BREAST CYST ASPIRATION Left    25 years ago   . MOHS SURGERY  10/05/2015  . RECTOCELE REPAIR  2011  . SKIN CANCER EXCISION  02/2015   Squamous cell removed from back  . TONSILLECTOMY    . VAGINAL HYSTERECTOMY    . VAGINAL PROLAPSE REPAIR     Family History  Problem Relation Age of Onset  . Asthma Father   . Angina Father   . Emphysema Father   . Alzheimer's disease Mother   . Cancer Sister        died of cancer   Social History   Socioeconomic History  . Marital status: Divorced    Spouse name: None  . Number of children: None  . Years of education: None  . Highest education level: None  Social Needs  . Financial resource strain: None  . Food insecurity - worry: None  . Food insecurity - inability: None  . Transportation needs - medical: None  . Transportation needs - non-medical: None  Occupational History  . None  Tobacco Use  . Smoking status: Former Research scientist (life sciences)  . Smokeless  tobacco: Never Used  Substance and Sexual Activity  . Alcohol use: Yes  . Drug use: No  . Sexual activity: No    Partners: Male  Other Topics Concern  . None  Social History Narrative  . None    Tobacco Counseling Counseling given: Not Answered   Clinical Intake: Pain : No/denies pain   Activities of Daily Living In your present state of health, do you have any difficulty performing the following activities: 07/05/2017  Hearing? N  Vision? N  Comment wears glasses for driving.   Difficulty concentrating or making decisions? N  Walking or climbing stairs? N  Dressing or bathing? N  Doing errands, shopping? N  Preparing Food and eating ? N  Using the Toilet? N  In the past six months, have you accidently leaked urine? N  Do you have problems with loss of bowel control? N  Managing your Medications? N  Managing your Finances? N  Housekeeping or managing your Housekeeping?  N  Some recent data might be hidden     Immunizations and Health Maintenance Immunization History  Administered Date(s) Administered  . Influenza, High Dose Seasonal PF 03/01/2017  . Influenza,inj,quad, With Preservative 02/24/2014  . Influenza-Unspecified 02/25/2013, 02/25/2013, 02/24/2014, 01/25/2015, 01/25/2015, 06/30/2016, 06/30/2016  . Pneumococcal Polysaccharide-23 12/22/2014   Health Maintenance Due  Topic Date Due  . Hepatitis C Screening  03-02-1951  . TETANUS/TDAP  02/06/1969  . DEXA SCAN  02/07/2015  . PNA vac Low Risk Adult (1 of 2 - PCV13) 12/22/2015    Patient Care Team: Shelda Pal, DO as PCP - General (Family Medicine)  Indicate any recent Medical Services you may have received from other than [facility] providers in the past year (date may be approximate).     Assessment:   This is a routine wellness examination for Mabelle. Physical assessment deferred to PCP.  Hearing/Vision screen Hearing Screening Comments: Able to hear conversational tones w/o difficulty. No  issues reported.  Passed whisper test.  Vision Screening Comments: Pt states she will schedule eye this month.  Dietary issues and exercise activities discussed: Current Exercise Habits: The patient does not participate in regular exercise at present, Exercise limited by: cardiac condition(s) Diet (meal preparation, eat out, water intake, caffeinated beverages, dairy products, fruits and vegetables): in general, a "healthy" diet  , well balanced     Goals    . Begin using siver sneakers.     Pt reports she joined [employer].       Depression Screen PHQ 2/9 Scores 07/05/2017  PHQ - 2 Score 0    Fall Risk Fall Risk  07/05/2017  Falls in the past year? No    Cognitive Function: MMSE - Mini Mental State Exam 07/05/2017  Orientation to time 5  Orientation to Place 5  Registration 3  Attention/ Calculation 5  Recall 3  Language- name 2 objects 2  Language- repeat 1  Language- follow 3 step command 3  Language- read & follow direction 1  Write a sentence 1  Copy design 1  Total score 30        Screening Tests Health Maintenance  Topic Date Due  . Hepatitis C Screening  September 09, 1950  . TETANUS/TDAP  02/06/1969  . DEXA SCAN  02/07/2015  . PNA vac Low Risk Adult (1 of 2 - PCV13) 12/22/2015  . MAMMOGRAM  03/20/2019  . COLONOSCOPY  04/15/2019  . INFLUENZA VACCINE  Completed     Plan:   Follow up with Dr.Wendling as scheduled.  Continue to eat heart healthy diet (full of fruits, vegetables, whole grains, lean protein, water--limit salt, fat, and sugar intake) and increase physical activity as tolerated.  Continue doing brain stimulating activities (puzzles, reading, adult coloring books, staying active) to keep memory sharp.   I have personally reviewed and noted the following in the patient's chart:   . Medical and social history . Use of alcohol, tobacco or illicit drugs  . Current medications and supplements . Functional ability and status . Nutritional  status . Physical activity . Advanced directives . List of other physicians . Hospitalizations, surgeries, and ER visits in previous 12 months . Vitals . Screenings to include cognitive, depression, and falls . Referrals and appointments  In addition, I have reviewed and discussed with patient certain preventive protocols, quality metrics, and best practice recommendations. A written personalized care plan for preventive services as well as general preventive health recommendations were provided to patient.     Shela Nevin,  RN   07/05/2017

## 2017-07-05 ENCOUNTER — Encounter: Payer: Self-pay | Admitting: *Deleted

## 2017-07-05 ENCOUNTER — Telehealth: Payer: Self-pay | Admitting: Cardiology

## 2017-07-05 ENCOUNTER — Ambulatory Visit (INDEPENDENT_AMBULATORY_CARE_PROVIDER_SITE_OTHER): Payer: PPO | Admitting: *Deleted

## 2017-07-05 VITALS — BP 141/76 | HR 57 | Ht 66.0 in | Wt 146.0 lb

## 2017-07-05 DIAGNOSIS — Z0000 Encounter for general adult medical examination without abnormal findings: Secondary | ICD-10-CM | POA: Diagnosis not present

## 2017-07-05 NOTE — Telephone Encounter (Signed)
Patient states that she is not taking Ditalizem which was reported on 07/05/2017 and she is wondering what she needs to put in its place.. If anything needs to be called in please call to the [facility] in [place].

## 2017-07-05 NOTE — Telephone Encounter (Signed)
Patient states that she stopped taking diltiazem because she felt that it was putting pressure on her ears. Wants to know if she needs to take anything different.

## 2017-07-05 NOTE — Patient Instructions (Signed)
Follow up with Dr.Wendling as scheduled.  Continue to eat heart healthy diet (full of fruits, vegetables, whole grains, lean protein, water--limit salt, fat, and sugar intake) and increase physical activity as tolerated.  Continue doing brain stimulating activities (puzzles, reading, adult coloring books, staying active) to keep memory sharp.    Jenna Campbell , Thank you for taking time to come for your Medicare Wellness Visit. I appreciate your ongoing commitment to your health goals. Please review the following plan we discussed and let me know if I can assist you in the future.   These are the goals we discussed: Goals    . Begin using siver sneakers.     Pt reports she joined [employer].        This is a list of the screening recommended for you and due dates:  Health Maintenance  Topic Date Due  .  Hepatitis C: One time screening is recommended by Center for Disease Control  (CDC) for  adults born from [birth year] through 1966.   1950-11-19  . Tetanus Vaccine  02/06/1969  . DEXA scan (bone density measurement)  02/07/2015  . Pneumonia vaccines (1 of 2 - PCV13) 12/22/2015  . Mammogram  03/20/2019  . Colon Cancer Screening  04/15/2019  . Flu Shot  Completed    Health Maintenance for Postmenopausal Women Menopause is a normal process in which your reproductive ability comes to an end. This process happens gradually over a span of months to years, usually between the ages of 40 and 34. Menopause is complete when you have missed 12 consecutive menstrual periods. It is important to talk with your health care provider about some of the most common conditions that affect postmenopausal women, such as heart disease, cancer, and bone loss (osteoporosis). Adopting a healthy lifestyle and getting preventive care can help to promote your health and wellness. Those actions can also lower your chances of developing some of these common conditions. What should I know about menopause? During  menopause, you may experience a number of symptoms, such as:  Moderate-to-severe hot flashes.  Night sweats.  Decrease in sex drive.  Mood swings.  Headaches.  Tiredness.  Irritability.  Memory problems.  Insomnia.  Choosing to treat or not to treat menopausal changes is an individual decision that you make with your health care provider. What should I know about hormone replacement therapy and supplements? Hormone therapy products are effective for treating symptoms that are associated with menopause, such as hot flashes and night sweats. Hormone replacement carries certain risks, especially as you become older. If you are thinking about using estrogen or estrogen with progestin treatments, discuss the benefits and risks with your health care provider. What should I know about heart disease and stroke? Heart disease, heart attack, and stroke become more likely as you age. This may be due, in part, to the hormonal changes that your body experiences during menopause. These can affect how your body processes dietary fats, triglycerides, and cholesterol. Heart attack and stroke are both medical emergencies. There are many things that you can do to help prevent heart disease and stroke:  Have your blood pressure checked at least every 1-2 years. High blood pressure causes heart disease and increases the risk of stroke.  If you are 35-35 years old, ask your health care provider if you should take aspirin to prevent a heart attack or a stroke.  Do not use any tobacco products, including cigarettes, chewing tobacco, or electronic cigarettes. If you need help quitting,  ask your health care provider.  It is important to eat a healthy diet and maintain a healthy weight. ? Be sure to include plenty of vegetables, fruits, low-fat dairy products, and lean protein. ? Avoid eating foods that are high in solid fats, added sugars, or salt (sodium).  Get regular exercise. This is one of the most  important things that you can do for your health. ? Try to exercise for at least 150 minutes each week. The type of exercise that you do should increase your heart rate and make you sweat. This is known as moderate-intensity exercise. ? Try to do strengthening exercises at least twice each week. Do these in addition to the moderate-intensity exercise.  Know your numbers.Ask your health care provider to check your cholesterol and your blood glucose. Continue to have your blood tested as directed by your health care provider.  What should I know about cancer screening? There are several types of cancer. Take the following steps to reduce your risk and to catch any cancer development as early as possible. Breast Cancer  Practice breast self-awareness. ? This means understanding how your breasts normally appear and feel. ? It also means doing regular breast self-exams. Let your health care provider know about any changes, no matter how small.  If you are 5 or older, have a clinician do a breast exam (clinical breast exam or CBE) every year. Depending on your age, family history, and medical history, it may be recommended that you also have a yearly breast X-ray (mammogram).  If you have a family history of breast cancer, talk with your health care provider about genetic screening.  If you are at high risk for breast cancer, talk with your health care provider about having an MRI and a mammogram every year.  Breast cancer (BRCA) gene test is recommended for women who have family members with BRCA-related cancers. Results of the assessment will determine the need for genetic counseling and BRCA1 and for BRCA2 testing. BRCA-related cancers include these types: ? Breast. This occurs in males or females. ? Ovarian. ? Tubal. This may also be called fallopian tube cancer. ? Cancer of the abdominal or pelvic lining (peritoneal cancer). ? Prostate. ? Pancreatic.  Cervical, Uterine, and Ovarian  Cancer Your health care provider may recommend that you be screened regularly for cancer of the pelvic organs. These include your ovaries, uterus, and vagina. This screening involves a pelvic exam, which includes checking for microscopic changes to the surface of your cervix (Pap test).  For women ages 21-65, health care providers may recommend a pelvic exam and a Pap test every three years. For women ages 57-65, they may recommend the Pap test and pelvic exam, combined with testing for human papilloma virus (HPV), every five years. Some types of HPV increase your risk of cervical cancer. Testing for HPV may also be done on women of any age who have unclear Pap test results.  Other health care providers may not recommend any screening for nonpregnant women who are considered low risk for pelvic cancer and have no symptoms. Ask your health care provider if a screening pelvic exam is right for you.  If you have had past treatment for cervical cancer or a condition that could lead to cancer, you need Pap tests and screening for cancer for at least 20 years after your treatment. If Pap tests have been discontinued for you, your risk factors (such as having a new sexual partner) need to be reassessed to determine  if you should start having screenings again. Some women have medical problems that increase the chance of getting cervical cancer. In these cases, your health care provider may recommend that you have screening and Pap tests more often.  If you have a family history of uterine cancer or ovarian cancer, talk with your health care provider about genetic screening.  If you have vaginal bleeding after reaching menopause, tell your health care provider.  There are currently no reliable tests available to screen for ovarian cancer.  Lung Cancer Lung cancer screening is recommended for adults 20-21 years old who are at high risk for lung cancer because of a history of smoking. A yearly low-dose CT scan  of the lungs is recommended if you:  Currently smoke.  Have a history of at least 30 pack-years of smoking and you currently smoke or have quit within the past 15 years. A pack-year is smoking an average of one pack of cigarettes per day for one year.  Yearly screening should:  Continue until it has been 15 years since you quit.  Stop if you develop a health problem that would prevent you from having lung cancer treatment.  Colorectal Cancer  This type of cancer can be detected and can often be prevented.  Routine colorectal cancer screening usually begins at age 64 and continues through age 26.  If you have risk factors for colon cancer, your health care provider may recommend that you be screened at an earlier age.  If you have a family history of colorectal cancer, talk with your health care provider about genetic screening.  Your health care provider may also recommend using home test kits to check for hidden blood in your stool.  A small camera at the end of a tube can be used to examine your colon directly (sigmoidoscopy or colonoscopy). This is done to check for the earliest forms of colorectal cancer.  Direct examination of the colon should be repeated every 5-10 years until age 49. However, if early forms of precancerous polyps or small growths are found or if you have a family history or genetic risk for colorectal cancer, you may need to be screened more often.  Skin Cancer  Check your skin from head to toe regularly.  Monitor any moles. Be sure to tell your health care provider: ? About any new moles or changes in moles, especially if there is a change in a mole's shape or color. ? If you have a mole that is larger than the size of a pencil eraser.  If any of your family members has a history of skin cancer, especially at a young age, talk with your health care provider about genetic screening.  Always use sunscreen. Apply sunscreen liberally and repeatedly  throughout the day.  Whenever you are outside, protect yourself by wearing long sleeves, pants, a wide-brimmed hat, and sunglasses.  What should I know about osteoporosis? Osteoporosis is a condition in which bone destruction happens more quickly than new bone creation. After menopause, you may be at an increased risk for osteoporosis. To help prevent osteoporosis or the bone fractures that can happen because of osteoporosis, the following is recommended:  If you are 47-74 years old, get at least 1,000 mg of calcium and at least 600 mg of vitamin D per day.  If you are older than age 78 but younger than age 57, get at least 1,200 mg of calcium and at least 600 mg of vitamin D per day.  If  you are older than age 37, get at least 1,200 mg of calcium and at least 800 mg of vitamin D per day.  Smoking and excessive alcohol intake increase the risk of osteoporosis. Eat foods that are rich in calcium and vitamin D, and do weight-bearing exercises several times each week as directed by your health care provider. What should I know about how menopause affects my mental health? Depression may occur at any age, but it is more common as you become older. Common symptoms of depression include:  Low or sad mood.  Changes in sleep patterns.  Changes in appetite or eating patterns.  Feeling an overall lack of motivation or enjoyment of activities that you previously enjoyed.  Frequent crying spells.  Talk with your health care provider if you think that you are experiencing depression. What should I know about immunizations? It is important that you get and maintain your immunizations. These include:  Tetanus, diphtheria, and pertussis (Tdap) booster vaccine.  Influenza every year before the flu season begins.  Pneumonia vaccine.  Shingles vaccine.  Your health care provider may also recommend other immunizations. This information is not intended to replace advice given to you by your health  care provider. Make sure you discuss any questions you have with your health care provider. Document Released: 06/23/2005 Document Revised: 11/19/2015 Document Reviewed: 02/02/2015 Elsevier Interactive Patient Education  2018 [employer].

## 2017-07-05 NOTE — Progress Notes (Signed)
Noted. Agree with above.  [place], DO 07/05/17 5:29 PM

## 2017-07-09 ENCOUNTER — Encounter: Payer: Self-pay | Admitting: Family Medicine

## 2017-07-09 ENCOUNTER — Ambulatory Visit (INDEPENDENT_AMBULATORY_CARE_PROVIDER_SITE_OTHER): Payer: PPO | Admitting: Family Medicine

## 2017-07-09 VITALS — BP 108/72 | HR 51 | Temp 97.6°F | Ht 66.0 in | Wt 146.1 lb

## 2017-07-09 DIAGNOSIS — J4521 Mild intermittent asthma with (acute) exacerbation: Secondary | ICD-10-CM

## 2017-07-09 MED ORDER — BETAMETHASONE VALERATE 0.1 % EX CREA: TOPICAL_CREAM | Freq: Two times a day (BID) | CUTANEOUS | 0 refills | Status: DC

## 2017-07-09 MED ORDER — FLUTICASONE-SALMETEROL 250-50 MCG/DOSE IN AEPB: 1.0000 | INHALATION_SPRAY | Freq: Two times a day (BID) | RESPIRATORY_TRACT | 1 refills | Status: DC

## 2017-07-09 NOTE — Progress Notes (Signed)
Chief Complaint  Patient presents with  . Cough  . Fatigue    Jenna Campbell here for URI complaints.  Duration: several days  Associated symptoms: sinus congestion, rhinorrhea, shortness of breath, chest tightness and cough Denies: sinus pain, itchy watery eyes, ear pain, ear drainage, sore throat and myalgia Treatment to date: Albuterol- very helpful Sick contacts: Yes - was exposed to flu 2 weeks ago +Hx of asthma.  ROS:  Const: Denies fevers HEENT: As noted in HPI Lungs: No current SOB  Past Medical History:  Diagnosis Date  . Adjustment disorder with anxious mood 04/21/2015  . Allergic rhinitis due to pollen 07/23/2014  . Alopecia 10/30/2013  . Arthritis 04/24/2016  . Atrophic vaginitis 10/30/2013  . Basal cell carcinoma 04/21/2015  . Cancer ([place])    skin cancer  . Cervical radiculopathy 09/17/2015  . Change in bowel function 06/14/2016  . Chronic diarrhea 12/16/2015  . Cystocele, midline 10/30/2013  . Dermatitis 09/22/2014  . Diverticulosis of large intestine 10/30/2013  . Dyspareunia 07/23/2014  . Essential hypertension   . Family history of pheochromocytoma 02/14/2016  . GERD (gastroesophageal reflux disease)   . Herpes simplex 08/17/2016  . History of adenomatous polyp of colon 06/14/2016  . History of blood transfusion 1970  . Hypothyroidism 10/30/2013  . Hypotonic bladder 10/30/2013  . Lumbar paraspinal muscle spasm 04/21/2015  . Major depression, recurrent ([place]) 07/14/2013  . Malaise and fatigue 04/21/2015  . MCI (mild cognitive impairment) 08/18/2015  . Menopause 03/31/2014  . Migraine   . Mild persistent asthma 05/13/2015  . Neuropathy 09/22/2014  . Personality disorder ([place]) 10/19/2012  . Pharyngoesophageal dysphagia 04/21/2015  . Plantar fasciitis 01/20/2016  . Precordial chest pain 08/18/2015  . Prolapse of vaginal vault after hysterectomy 10/30/2013  . Rectocele 10/30/2013  . Sinusitis, chronic 10/30/2013  . Slow transit constipation 07/23/2014  . Swelling of lower limb  05/17/2016  . Tension-type headache, not intractable 09/17/2015  . Urethral stricture 10/30/2013  . Urinary incontinence without sensory awareness 07/17/2016  . Varicose veins of both lower extremities 04/24/2016  . Venous insufficiency (chronic) (peripheral) 06/06/2016   Family History  Problem Relation Age of Onset  . Asthma Father   . Angina Father   . Emphysema Father   . Alzheimer's disease Mother   . Cancer Sister        died of cancer    BP 108/72 (BP Location: Left Arm, Patient Position: Sitting, Cuff Size: Normal)   Pulse (!) 51   Temp 97.6 F (36.4 C) (Oral)   Ht 5\' 6"  (1.676 m)   Wt 146 lb 2 oz (66.3 kg)   SpO2 98%   BMI 23.59 kg/m  General: Awake, alert, appears stated age HEENT: AT, [place], ears patent b/l and TM's neg, nares patent w/o discharge, pharynx pink and without exudates, MMM Neck: No masses or asymmetry Heart: RRR Lungs: CTAB, no accessory muscle use Psych: Age appropriate judgment and insight, normal mood and affect  Mild intermittent asthma with acute exacerbation - Plan: Fluticasone-Salmeterol (ADVAIR) 250-50 MCG/DOSE AEPB  Orders as above. I think this is related to her asthma. Stop Asthmanex. Start dual inhaled tx. Remember to rinse mouth out after use.  Continue to push fluids, practice good hand hygiene, cover mouth when coughing. F/u in 1 mo. Pt voiced understanding and agreement to the plan.  [place], DO 07/09/17 9:53 AM

## 2017-07-09 NOTE — Patient Instructions (Addendum)
Continue using the albuterol as needed (rescue inhaler). Continue the Flonase and allergy medicine.  Stop all other inhalers other than your albuterol inhaler/nebulizer.   Let me know if the medicine is too expensive.   Rinse mouth out after using new inhaler.   Let us know if you need anything.

## 2017-07-09 NOTE — Progress Notes (Signed)
Pre visit review using our clinic review tool, if applicable. No additional management support is needed unless otherwise documented below in the visit note. 

## 2017-07-12 ENCOUNTER — Telehealth: Payer: Self-pay | Admitting: Family Medicine

## 2017-07-12 MED ORDER — BUDESONIDE-FORMOTEROL FUMARATE 80-4.5 MCG/ACT IN AERO: 2.0000 | INHALATION_SPRAY | Freq: Two times a day (BID) | RESPIRATORY_TRACT | 12 refills | Status: DC

## 2017-07-12 NOTE — Telephone Encounter (Signed)
Copied from [place]. Topic: Quick Communication - See Telephone Encounter >> Jul 12, 2017  2:07 PM [place], Laban Emperor wrote: CRM for notification. See Telephone encounter for: Pt states that she is not able to afford the  Fluticasone-Salmeterol (ADVAIR) 250-50 MCG/DOSE AEPB [[id number]] and would like to know if something else can be called in.  [facility] [place], [place] [address] (Phone) [phone] (Fax)   07/12/17.

## 2017-07-12 NOTE — Telephone Encounter (Signed)
Called her and informed to pickup coupon/go by pharmacy to see how much the cost will be with the coupon. In the meantime ok to keep using the asmanax

## 2017-07-12 NOTE — Telephone Encounter (Signed)
The patient states the inhaler sent in is too expensive.  She still currently has her asmanex (but the AVS stated for her to stop it)and would like just to stay on that as does work ok and she can afford it.

## 2017-07-12 NOTE — Telephone Encounter (Signed)
That's fine for now. Let's see if Symbicort is covered w payment card. I have D/C'd Advair and ordered Symbicort.  TY.

## 2017-07-18 DIAGNOSIS — F332 Major depressive disorder, recurrent severe without psychotic features: Secondary | ICD-10-CM | POA: Diagnosis not present

## 2017-07-18 DIAGNOSIS — F449 Dissociative and conversion disorder, unspecified: Secondary | ICD-10-CM | POA: Diagnosis not present

## 2017-07-18 DIAGNOSIS — F330 Major depressive disorder, recurrent, mild: Secondary | ICD-10-CM | POA: Diagnosis not present

## 2017-07-20 ENCOUNTER — Other Ambulatory Visit: Payer: Self-pay | Admitting: Family Medicine

## 2017-07-20 MED ORDER — CLOBETASOL PROPIONATE 0.05 % EX CREA: 1.0000 "application " | TOPICAL_CREAM | Freq: Two times a day (BID) | CUTANEOUS | 0 refills | Status: DC

## 2017-08-01 ENCOUNTER — Ambulatory Visit: Payer: Medicare Other | Admitting: Cardiology

## 2017-08-01 DIAGNOSIS — F332 Major depressive disorder, recurrent severe without psychotic features: Secondary | ICD-10-CM | POA: Diagnosis not present

## 2017-08-06 ENCOUNTER — Encounter: Payer: Self-pay | Admitting: Cardiology

## 2017-08-06 ENCOUNTER — Ambulatory Visit (INDEPENDENT_AMBULATORY_CARE_PROVIDER_SITE_OTHER): Payer: PPO | Admitting: Cardiology

## 2017-08-06 VITALS — BP 124/80 | HR 66 | Ht 66.0 in | Wt 144.8 lb

## 2017-08-06 DIAGNOSIS — I10 Essential (primary) hypertension: Secondary | ICD-10-CM | POA: Diagnosis not present

## 2017-08-06 DIAGNOSIS — M940 Chondrocostal junction syndrome [Tietze]: Secondary | ICD-10-CM

## 2017-08-06 MED ORDER — MELOXICAM 15 MG PO TABS: 15.0000 mg | ORAL_TABLET | Freq: Every day | ORAL | 2 refills | Status: DC

## 2017-08-06 MED ORDER — NITROGLYCERIN 0.4 MG SL SUBL: 0.4000 mg | SUBLINGUAL_TABLET | SUBLINGUAL | 11 refills | Status: DC | PRN

## 2017-08-06 NOTE — Progress Notes (Signed)
Cardiology Office Note:    Date:  08/06/2017   ID:  Jenna Campbell, DOB 05/20/1950, MRN [id number]  PCP:  Shelda Pal, DO  Cardiologist:  Shirlee More, MD    Referring MD: Shelda Pal*    ASSESSMENT:    1. Costochondritis   2. Essential hypertension    PLAN:    In order of problems listed above:  1. She will initiate a nonsteroidal anti-inflammatory drug and if unimproved I will refer to physical therapy modalities.  At this time I do not think she requires cardiac CTA or left heart catheterization. 2. Stable continue current treatment   Next appointment: 6 weeks   Medication Adjustments/Labs and Tests Ordered: Current medicines are reviewed at length with the patient today.  Concerns regarding medicines are outlined above.  Orders Placed This Encounter  Procedures  . EKG 12-Lead   Meds ordered this encounter  Medications  . meloxicam (MOBIC) 15 MG tablet    Sig: Take 1 tablet (15 mg total) by mouth daily.    Dispense:  30 tablet    Refill:  2  . nitroGLYCERIN (NITROSTAT) 0.4 MG SL tablet    Sig: Place 1 tablet (0.4 mg total) under the tongue every 5 (five) minutes as needed for chest pain.    Dispense:  25 tablet    Refill:  11    Chief Complaint  Patient presents with  . Follow-up    3 month flup appt   . Chest Pain    History of Present Illness:    Jenna Campbell is a 67 y.o. female with a hx of chest pain with a normal MPI 03/15/17, asthma and ypertension last seen 3 months ago.  ASSESSMENT:    05/03/17   1. Angina pectoris ([place])    PLAN:    1. Her myocardial perfusion study is normal placing her at low risk for myocardial infarction cardiac death.  Her symptoms are difficult to discern as there is an element of bronchospasm cough wheezing improvement bronchodilator and element of what sounds like chest wall pain as well as intermittent episodes of exertional anginal discomfort relieved with rest.  To treat a vasospastic  component placed on long-acting calcium channel blocker and nitroglycerin as needed.  I saw to give her reassurance.  I will plan to see him back in the office in 3 months to assess her response and stressed the need with her up with compliance with her bronchodilators  Compliance with diet, lifestyle and medications: Yes  She is emotionally distressed and continues to have chest pain occurring several times a week it is localized to the sternal junction right and left along the left costochondral junction.  It is nonexertional it is unrelieved with rest and her mayor may not be relieved with nitroglycerin.  Her physical examination shows that the symptoms are reproducible on exam there is no exertional anginal component she has had a normal myocardial perfusion study and I do not think she requires coronary angiography.  She will start a nonsteroidal anti-inflammatory drug and is quite reassured.  If unimproved physical therapy modalities would be appropriate Past Medical History:  Diagnosis Date  . Adjustment disorder with anxious mood 04/21/2015  . Allergic rhinitis due to pollen 07/23/2014  . Alopecia 10/30/2013  . Arthritis 04/24/2016  . Atrophic vaginitis 10/30/2013  . Basal cell carcinoma 04/21/2015  . Cancer ([place])    skin cancer  . Cervical radiculopathy 09/17/2015  . Change in bowel function 06/14/2016  . Chronic diarrhea  12/16/2015  . Cystocele, midline 10/30/2013  . Dermatitis 09/22/2014  . Diverticulosis of large intestine 10/30/2013  . Dyspareunia 07/23/2014  . Essential hypertension   . Family history of pheochromocytoma 02/14/2016  . GERD (gastroesophageal reflux disease)   . Herpes simplex 08/17/2016  . History of adenomatous polyp of colon 06/14/2016  . History of blood transfusion 1970  . Hypothyroidism 10/30/2013  . Hypotonic bladder 10/30/2013  . Lumbar paraspinal muscle spasm 04/21/2015  . Major depression, recurrent ([place]) 07/14/2013  . Malaise and fatigue 04/21/2015  . MCI (mild  cognitive impairment) 08/18/2015  . Menopause 03/31/2014  . Migraine   . Mild persistent asthma 05/13/2015  . Neuropathy 09/22/2014  . Personality disorder ([place]) 10/19/2012  . Pharyngoesophageal dysphagia 04/21/2015  . Plantar fasciitis 01/20/2016  . Precordial chest pain 08/18/2015  . Prolapse of vaginal vault after hysterectomy 10/30/2013  . Rectocele 10/30/2013  . Sinusitis, chronic 10/30/2013  . Slow transit constipation 07/23/2014  . Swelling of lower limb 05/17/2016  . Tension-type headache, not intractable 09/17/2015  . Urethral stricture 10/30/2013  . Urinary incontinence without sensory awareness 07/17/2016  . Varicose veins of both lower extremities 04/24/2016  . Venous insufficiency (chronic) (peripheral) 06/06/2016    Past Surgical History:  Procedure Laterality Date  . BREAST CYST ASPIRATION Left    25 years ago   . MOHS SURGERY  10/05/2015  . RECTOCELE REPAIR  2011  . SKIN CANCER EXCISION  02/2015   Squamous cell removed from back  . TONSILLECTOMY    . VAGINAL HYSTERECTOMY    . VAGINAL PROLAPSE REPAIR      Current Medications: Current Meds  Medication Sig  . albuterol (PROAIR HFA) 108 (90 Base) MCG/ACT inhaler Inhale 2 puffs into the lungs every 4 (four) hours as needed for wheezing or shortness of breath.  Marland Kitchen albuterol (PROVENTIL) (2.5 MG/3ML) 0.083% nebulizer solution Take 3 mLs (2.5 mg total) by nebulization every 6 (six) hours as needed for wheezing or shortness of breath.  . betamethasone valerate (VALISONE) 0.1 % cream Apply topically 2 (two) times daily.  . clobetasol cream (TEMOVATE) 9.32 % Apply 1 application topically 2 (two) times daily. For 10 days  . fluticasone (FLONASE) 50 MCG/ACT nasal spray Place 1 spray into both nostrils daily.  Marland Kitchen gabapentin (NEURONTIN) 300 MG capsule Take 300 mg by mouth 2 (two) times daily.  Marland Kitchen levothyroxine (SYNTHROID, LEVOTHROID) 150 MCG tablet Take 1.5 tablets (225 mcg total) by mouth daily before breakfast.  . LORazepam (ATIVAN) 0.5 MG  tablet Take 0.5 mg by mouth every 8 (eight) hours as needed.   Marland Kitchen omeprazole (PRILOSEC) 40 MG capsule Take 40 mg by mouth daily.  . temazepam (RESTORIL) 30 MG capsule :take 1 cap at night for sleep (to replace 15mg  size)  . tiZANidine (ZANAFLEX) 2 MG tablet Take 1 tablet (2 mg total) by mouth every 8 (eight) hours as needed for muscle spasms.  Marland Kitchen topiramate (TOPAMAX) 100 MG tablet Take 100 mg by mouth 2 (two) times daily.     Allergies:   Iodinated diagnostic agents; Kenalog [triamcinolone acetonide]; Metrizamide; Other; Penicillins; Symbicort [budesonide-formoterol fumarate]; Diazepam; and Sulfa antibiotics   Social History   Socioeconomic History  . Marital status: Divorced    Spouse name: Not on file  . Number of children: Not on file  . Years of education: Not on file  . Highest education level: Not on file  Occupational History  . Not on file  Social Needs  . Financial resource strain: Not on file  .  Food insecurity:    Worry: Not on file    Inability: Not on file  . Transportation needs:    Medical: Not on file    Non-medical: Not on file  Tobacco Use  . Smoking status: Former Research scientist (life sciences)  . Smokeless tobacco: Never Used  Substance and Sexual Activity  . Alcohol use: Yes  . Drug use: No  . Sexual activity: Never    Partners: Male  Lifestyle  . Physical activity:    Days per week: Not on file    Minutes per session: Not on file  . Stress: Not on file  Relationships  . Social connections:    Talks on phone: Not on file    Gets together: Not on file    Attends religious service: Not on file    Active member of club or organization: Not on file    Attends meetings of clubs or organizations: Not on file    Relationship status: Not on file  Other Topics Concern  . Not on file  Social History Narrative  . Not on file     Family History: The patient's family history includes Alzheimer's disease in her mother; Angina in her father; Asthma in her father; Cancer in her  sister; Emphysema in her father. ROS:   Please see the history of present illness.    All other systems reviewed and are negative.  EKGs/Labs/Other Studies Reviewed:    The following studies were reviewed today:  EKG:  EKG ordered today.  The ekg ordered today demonstrates sinus rhythm normal  Recent Labs: 04/12/2017: TSH 3.11  Recent Lipid Panel No results found for: CHOL, TRIG, HDL, CHOLHDL, VLDL, LDLCALC, LDLDIRECT  Physical Exam:    VS:  BP 124/80 (BP Location: Right Arm, Patient Position: Sitting, Cuff Size: Normal)   Pulse 66   Ht 5\' 6"  (1.676 m)   Wt 144 lb 12.8 oz (65.7 kg)   SpO2 98%   BMI 23.37 kg/m     Wt Readings from Last 3 Encounters:  08/06/17 144 lb 12.8 oz (65.7 kg)  07/09/17 146 lb 2 oz (66.3 kg)  07/05/17 146 lb (66.2 kg)     GEN:  Well nourished, well developed in no acute distress HEENT: Normal NECK: No JVD; No carotid bruits LYMPHATICS: No lymphadenopathy CARDIAC: tender over CCJ L > R reproduces her complaint  RRR, no murmurs, rubs, gallops RESPIRATORY:  Clear to auscultation without rales, wheezing or rhonchi  ABDOMEN: Soft, non-tender, non-distended MUSCULOSKELETAL:  No edema; No deformity  SKIN: Warm and dry NEUROLOGIC:  Alert and oriented x 3 PSYCHIATRIC:  Normal affect    Signed,  Shirlee More, MD  08/06/2017 12:51 PM    [facility]

## 2017-08-06 NOTE — Patient Instructions (Addendum)
Medication Instructions:  Your physician has recommended you make the following change in your medication:  START meloxicam (Mobic) 15 mg daily  Labwork: None  Testing/Procedures: You had an EKG today.  Follow-Up: Your physician recommends that you schedule a follow-up appointment in: 4 weeks.  Any Other Special Instructions Will Be Listed Below (If Applicable).     If you need a refill on your cardiac medications before your next appointment, please call your pharmacy.    Costochondritis Costochondritis is swelling and irritation (inflammation) of the tissue (cartilage) that connects your ribs to your breastbone (sternum). This causes pain in the front of your chest. Usually, the pain:  Starts gradually.  Is in more than one rib.  This condition usually goes away on its own over time. Follow these instructions at home:  Do not do anything that makes your pain worse.  If directed, put ice on the painful area: ? Put ice in a plastic bag. ? Place a towel between your skin and the bag. ? Leave the ice on for 20 minutes, 2-3 times a day.  If directed, put heat on the affected area as often as told by your doctor. Use the heat source that your doctor tells you to use, such as a moist heat pack or a heating pad. ? Place a towel between your skin and the heat source. ? Leave the heat on for 20-30 minutes. ? Take off the heat if your skin turns bright red. This is very important if you cannot feel pain, heat, or cold. You may have a greater risk of getting burned.  Take over-the-counter and prescription medicines only as told by your doctor.  Return to your normal activities as told by your doctor. Ask your doctor what activities are safe for you.  Keep all follow-up visits as told by your doctor. This is important. Contact a doctor if:  You have chills or a fever.  Your pain does not go away or it gets worse.  You have a cough that does not go away. Get help right  away if:  You are short of breath. This information is not intended to replace advice given to you by your health care provider. Make sure you discuss any questions you have with your health care provider. Document Released: 10/18/2007 Document Revised: 11/19/2015 Document Reviewed: 08/25/2015 Elsevier Interactive Patient Education  2018 [employer].    Call if unimproved within 2 weeks

## 2017-08-13 DIAGNOSIS — F332 Major depressive disorder, recurrent severe without psychotic features: Secondary | ICD-10-CM | POA: Diagnosis not present

## 2017-08-24 ENCOUNTER — Ambulatory Visit (INDEPENDENT_AMBULATORY_CARE_PROVIDER_SITE_OTHER): Payer: PPO | Admitting: Family Medicine

## 2017-08-24 ENCOUNTER — Ambulatory Visit: Payer: PPO | Admitting: Family Medicine

## 2017-08-24 ENCOUNTER — Encounter: Payer: Self-pay | Admitting: Family Medicine

## 2017-08-24 VITALS — BP 144/78 | HR 59 | Temp 97.9°F | Ht 66.0 in | Wt 145.0 lb

## 2017-08-24 DIAGNOSIS — D1801 Hemangioma of skin and subcutaneous tissue: Secondary | ICD-10-CM

## 2017-08-24 DIAGNOSIS — R233 Spontaneous ecchymoses: Secondary | ICD-10-CM

## 2017-08-24 DIAGNOSIS — M546 Pain in thoracic spine: Secondary | ICD-10-CM

## 2017-08-24 DIAGNOSIS — D126 Benign neoplasm of colon, unspecified: Secondary | ICD-10-CM | POA: Diagnosis not present

## 2017-08-24 DIAGNOSIS — I781 Nevus, non-neoplastic: Secondary | ICD-10-CM

## 2017-08-24 DIAGNOSIS — R1030 Lower abdominal pain, unspecified: Secondary | ICD-10-CM | POA: Diagnosis not present

## 2017-08-24 LAB — POC URINALSYSI DIPSTICK (AUTOMATED)
Bilirubin, UA: NEGATIVE
Blood, UA: NEGATIVE
Glucose, UA: NEGATIVE
Ketones, UA: NEGATIVE
Leukocytes, UA: NEGATIVE
Nitrite, UA: NEGATIVE
Protein, UA: NEGATIVE
Spec Grav, UA: <=1.005 — AB (ref 1.010–1.025)
Urobilinogen, UA: 0.2 U/dL
pH, UA: 6 (ref 5.0–8.0)

## 2017-08-24 NOTE — Progress Notes (Signed)
Chief Complaint  Patient presents with  . Back Pain  . spots on vagina    Subjective: Patient is a 67 y.o. female here for lower abd pain/back pain.  Pt has several d hx of R lower back pain, lower abd pain, and skin lesions. Skin lesions found by roommate on back, scaly. Not changing to her knowledge. No hx of skin cancer.   Low back pain concerns her for possible UTI with lower abd pain. She is constipated. Some urinary freq. Denies pain, bleeding, incontinence, fevers.  She also noticed some dark spots on her genitalia that she cannot scape away. Unsure if it is change. It does not hurt or itch. No new topicals. Has not tried anything yet so far.    ROS: GU: As noted in HPI Const: No fevers  Family History  Problem Relation Age of Onset  . Asthma Father   . Angina Father   . Emphysema Father   . Alzheimer's disease Mother   . Cancer Sister        died of cancer   Past Medical History:  Diagnosis Date  . Adjustment disorder with anxious mood 04/21/2015  . Allergic rhinitis due to pollen 07/23/2014  . Alopecia 10/30/2013  . Arthritis 04/24/2016  . Atrophic vaginitis 10/30/2013  . Basal cell carcinoma 04/21/2015  . Cancer ([place])    skin cancer  . Cervical radiculopathy 09/17/2015  . Change in bowel function 06/14/2016  . Chronic diarrhea 12/16/2015  . Cystocele, midline 10/30/2013  . Dermatitis 09/22/2014  . Diverticulosis of large intestine 10/30/2013  . Dyspareunia 07/23/2014  . Essential hypertension   . Family history of pheochromocytoma 02/14/2016  . GERD (gastroesophageal reflux disease)   . Herpes simplex 08/17/2016  . History of adenomatous polyp of colon 06/14/2016  . History of blood transfusion 1970  . Hypothyroidism 10/30/2013  . Hypotonic bladder 10/30/2013  . Lumbar paraspinal muscle spasm 04/21/2015  . Major depression, recurrent ([place]) 07/14/2013  . Malaise and fatigue 04/21/2015  . MCI (mild cognitive impairment) 08/18/2015  . Menopause 03/31/2014  . Migraine   . Mild  persistent asthma 05/13/2015  . Neuropathy 09/22/2014  . Personality disorder ([place]) 10/19/2012  . Pharyngoesophageal dysphagia 04/21/2015  . Plantar fasciitis 01/20/2016  . Precordial chest pain 08/18/2015  . Prolapse of vaginal vault after hysterectomy 10/30/2013  . Rectocele 10/30/2013  . Sinusitis, chronic 10/30/2013  . Slow transit constipation 07/23/2014  . Swelling of lower limb 05/17/2016  . Tension-type headache, not intractable 09/17/2015  . Urethral stricture 10/30/2013  . Urinary incontinence without sensory awareness 07/17/2016  . Varicose veins of both lower extremities 04/24/2016  . Venous insufficiency (chronic) (peripheral) 06/06/2016   Allergies  Allergen Reactions  . Iodinated Diagnostic Agents Itching, Other (See Comments), Rash and Shortness Of Breath    Throat Swelling, Erythema  . Kenalog [Triamcinolone Acetonide] Swelling  . Metrizamide Itching, Other (See Comments), Rash and Shortness Of Breath    Throat Swelling, Erythema  . Other Itching and Swelling    DUST  . Penicillins   . Symbicort [Budesonide-Formoterol Fumarate]   . Diazepam Rash  . Sulfa Antibiotics Rash    Current Outpatient Medications:  .  albuterol (PROAIR HFA) 108 (90 Base) MCG/ACT inhaler, Inhale 2 puffs into the lungs every 4 (four) hours as needed for wheezing or shortness of breath., Disp: 1 Inhaler, Rfl: 3 .  albuterol (PROVENTIL) (2.5 MG/3ML) 0.083% nebulizer solution, Take 3 mLs (2.5 mg total) by nebulization every 6 (six) hours as needed for wheezing  or shortness of breath., Disp: 75 mL, Rfl: 0 .  betamethasone valerate (VALISONE) 0.1 % cream, Apply topically 2 (two) times daily., Disp: 30 g, Rfl: 0 .  clobetasol cream (TEMOVATE) 4.19 %, Apply 1 application topically 2 (two) times daily. For 10 days, Disp: 30 g, Rfl: 0 .  fluticasone (FLONASE) 50 MCG/ACT nasal spray, Place 1 spray into both nostrils daily., Disp: , Rfl:  .  gabapentin (NEURONTIN) 300 MG capsule, Take 300 mg by mouth 2 (two) times  daily., Disp: , Rfl:  .  levothyroxine (SYNTHROID, LEVOTHROID) 150 MCG tablet, Take 1.5 tablets (225 mcg total) by mouth daily before breakfast., Disp: 135 tablet, Rfl: 3 .  LORazepam (ATIVAN) 0.5 MG tablet, Take 0.5 mg by mouth every 8 (eight) hours as needed. , Disp: , Rfl:  .  meloxicam (MOBIC) 15 MG tablet, Take 1 tablet (15 mg total) by mouth daily., Disp: 30 tablet, Rfl: 2 .  nitroGLYCERIN (NITROSTAT) 0.4 MG SL tablet, Place 1 tablet (0.4 mg total) under the tongue every 5 (five) minutes as needed for chest pain., Disp: 25 tablet, Rfl: 11 .  omeprazole (PRILOSEC) 40 MG capsule, Take 40 mg by mouth daily., Disp: , Rfl:  .  temazepam (RESTORIL) 30 MG capsule, :take 1 cap at night for sleep (to replace 15mg  size), Disp: , Rfl:  .  tiZANidine (ZANAFLEX) 2 MG tablet, Take 1 tablet (2 mg total) by mouth every 8 (eight) hours as needed for muscle spasms., Disp: 30 tablet, Rfl: 0 .  topiramate (TOPAMAX) 100 MG tablet, Take 100 mg by mouth 2 (two) times daily., Disp: , Rfl:   Objective: BP (!) 144/78 (BP Location: Left Arm, Patient Position: Sitting, Cuff Size: Normal)   Pulse (!) 59   Temp 97.9 F (36.6 C) (Oral)   Ht 5\' 6"  (1.676 m)   Wt 145 lb (65.8 kg)   SpO2 97%   BMI 23.40 kg/m  General: Awake, appears stated age HEENT: MMM, EOMi Heart: RRR Lungs: CTAB, no rales, wheezes or rhonchi. No accessory muscle use Abd: BS+, soft, mild ttp in lower quadrants, ND, no masses or organomegaly Skin: Pt examined in presence of female chaperone, Robin Ewing, CMA. Red papule on back, SK on R back, there are black pinpoint lesions over left labia representing old blood/bruising? No fluctuance, ttp, erythema.i could not ID any lesion on the R side. Psych: Age appropriate judgment and insight, normal affect and mood  Assessment and Plan: Lower abdominal pain - Plan: POCT Urinalysis Dipstick (Automated), Urine Culture  Adenomatous polyp of colon, unspecified part of colon - Plan: Ambulatory referral to  Gastroenterology  Cherry angioma  Acute right-sided thoracic back pain  Petechiae  Orders as above. Will cx urine for completeness. Refer to new GI given issue with current GI and Medicare troubles. Reassurance for skin issues, she will let us know if anything changes. Anti-inflammatories, Tylenol, heat, ice, stretching for her back. Follow-up as needed. The patient voiced understanding and agreement to the plan.  [place], DO 08/24/17  4:57 PM

## 2017-08-24 NOTE — Progress Notes (Signed)
Pre visit review using our clinic review tool, if applicable. No additional management support is needed unless otherwise documented below in the visit note. 

## 2017-08-24 NOTE — Patient Instructions (Addendum)
Your urine test is normal.  Stay well hydrated.  Try MiraLAX 1-2 times daily over the next 3-4 days. If no improvement, try using an enema. Stay well hydrated and keep lots of fiber in your diet.  Your skin on your back looks normal. The skin in your genital region looks un-concerning as well. If it grows, let me know.   We are culturing your urine, if anything changes, we will let you know.  If you do not hear anything about your referral in the next 1-2 weeks, call our office and ask for an update.  Ibuprofen 400-600 mg (2-3 over the counter strength tabs) every 6 hours as needed for pain.  OK to take Tylenol 1000 mg (2 extra strength tabs) or 975 mg (3 regular strength tabs) every 6 hours as needed.  Heat (pad or rice pillow in microwave) over affected area, 10-15 minutes twice daily.   Try a water based lubricant to help with dryness.

## 2017-08-25 LAB — URINE CULTURE
MICRO NUMBER:: 90453571
Result:: NO GROWTH
SPECIMEN QUALITY:: ADEQUATE

## 2017-08-27 DIAGNOSIS — F332 Major depressive disorder, recurrent severe without psychotic features: Secondary | ICD-10-CM | POA: Diagnosis not present

## 2017-09-02 NOTE — Progress Notes (Signed)
Cardiology Office Note:    Date:  09/03/2017   ID:  Jenna Campbell, DOB December 27, 1950, MRN [id number]  PCP:  Shelda Pal, DO  Cardiologist:  Shirlee More, MD    Referring MD: Shelda Pal*    ASSESSMENT:    1. Costochondritis   2. Essential hypertension   3. Palpitation    PLAN:    In order of problems listed above:  1. Improved she will continue using nonsteroidal anti-inflammatory drug 2. Stable blood pressure target continue current treatment 3. She had one brief episode no recurrence at this time I would not pursue an arrhythmia evaluation unless this is a recurrent ongoing problem.   Next appointment: 6 months   Medication Adjustments/Labs and Tests Ordered: Current medicines are reviewed at length with the patient today.  Concerns regarding medicines are outlined above.  Orders Placed This Encounter  Procedures  . EKG 12-Lead   Meds ordered this encounter  Medications  . meloxicam (MOBIC) 15 MG tablet    Sig: Take 1 tablet (15 mg total) by mouth daily.    Dispense:  30 tablet    Refill:  2    Chief Complaint  Patient presents with  . Follow-up    costochondral  chest pain  . Hypertension    History of Present Illness:    Jenna Campbell is a 67 y.o. female with a hx of costochondral chest pain with a normal MPI 03/15/17, asthma and hypertension last seen 08/06/17. She is considering independent living relocation  ASSESSMENT:    08/06/17   1. Costochondritis   2. Essential hypertension    PLAN:    1.    She will initiate a nonsteroidal anti-inflammatory drug and if unimproved I will refer to physical therapy modalities.  At this time  do not think she requires cardiac CTA or left heart catheterization. 2.    Stable continue current treatment  Compliance with diet, lifestyle and medications: Yes Chest pain is improved with nonsteroidal anti-inflammatory drugs she did not enroll in physical therapy.  She had one episode where her  heart was racing that awakened her from her sleep it was brief and terminated spontaneously.  She feels she is under increased stress and is considering relocating to independent living. Past Medical History:  Diagnosis Date  . Adjustment disorder with anxious mood 04/21/2015  . Allergic rhinitis due to pollen 07/23/2014  . Alopecia 10/30/2013  . Arthritis 04/24/2016  . Atrophic vaginitis 10/30/2013  . Basal cell carcinoma 04/21/2015  . Cancer ([place])    skin cancer  . Cervical radiculopathy 09/17/2015  . Change in bowel function 06/14/2016  . Chronic diarrhea 12/16/2015  . Cystocele, midline 10/30/2013  . Dermatitis 09/22/2014  . Diverticulosis of large intestine 10/30/2013  . Dyspareunia 07/23/2014  . Essential hypertension   . Family history of pheochromocytoma 02/14/2016  . GERD (gastroesophageal reflux disease)   . Herpes simplex 08/17/2016  . History of adenomatous polyp of colon 06/14/2016  . History of blood transfusion 1970  . Hypothyroidism 10/30/2013  . Hypotonic bladder 10/30/2013  . Lumbar paraspinal muscle spasm 04/21/2015  . Major depression, recurrent ([place]) 07/14/2013  . Malaise and fatigue 04/21/2015  . MCI (mild cognitive impairment) 08/18/2015  . Menopause 03/31/2014  . Migraine   . Mild persistent asthma 05/13/2015  . Neuropathy 09/22/2014  . Personality disorder ([place]) 10/19/2012  . Pharyngoesophageal dysphagia 04/21/2015  . Plantar fasciitis 01/20/2016  . Precordial chest pain 08/18/2015  . Prolapse of vaginal vault after hysterectomy 10/30/2013  .  Rectocele 10/30/2013  . Sinusitis, chronic 10/30/2013  . Slow transit constipation 07/23/2014  . Swelling of lower limb 05/17/2016  . Tension-type headache, not intractable 09/17/2015  . Urethral stricture 10/30/2013  . Urinary incontinence without sensory awareness 07/17/2016  . Varicose veins of both lower extremities 04/24/2016  . Venous insufficiency (chronic) (peripheral) 06/06/2016    Past Surgical History:  Procedure Laterality Date  .  BREAST CYST ASPIRATION Left    25 years ago   . MOHS SURGERY  10/05/2015  . RECTOCELE REPAIR  2011  . SKIN CANCER EXCISION  02/2015   Squamous cell removed from back  . TONSILLECTOMY    . VAGINAL HYSTERECTOMY    . VAGINAL PROLAPSE REPAIR      Current Medications: Current Meds  Medication Sig  . albuterol (PROAIR HFA) 108 (90 Base) MCG/ACT inhaler Inhale 2 puffs into the lungs every 4 (four) hours as needed for wheezing or shortness of breath.  Marland Kitchen albuterol (PROVENTIL) (2.5 MG/3ML) 0.083% nebulizer solution Take 3 mLs (2.5 mg total) by nebulization every 6 (six) hours as needed for wheezing or shortness of breath.  . betamethasone valerate (VALISONE) 0.1 % cream Apply topically 2 (two) times daily.  . clobetasol cream (TEMOVATE) 6.60 % Apply 1 application topically 2 (two) times daily. For 10 days  . fexofenadine (ALLEGRA) 180 MG tablet Take 180 mg by mouth daily.  . fluticasone (FLONASE) 50 MCG/ACT nasal spray Place 1 spray into both nostrils daily.  Marland Kitchen gabapentin (NEURONTIN) 300 MG capsule Take 300 mg by mouth 2 (two) times daily.  Marland Kitchen levothyroxine (SYNTHROID, LEVOTHROID) 150 MCG tablet Take 1.5 tablets (225 mcg total) by mouth daily before breakfast.  . LORazepam (ATIVAN) 0.5 MG tablet Take 0.5 mg by mouth every 8 (eight) hours as needed.   . meloxicam (MOBIC) 15 MG tablet Take 1 tablet (15 mg total) by mouth daily.  . nitroGLYCERIN (NITROSTAT) 0.4 MG SL tablet Place 1 tablet (0.4 mg total) under the tongue every 5 (five) minutes as needed for chest pain.  Marland Kitchen omeprazole (PRILOSEC) 40 MG capsule Take 40 mg by mouth daily.  . temazepam (RESTORIL) 30 MG capsule :take 1 cap at night for sleep (to replace 15mg  size)  . tiZANidine (ZANAFLEX) 2 MG tablet Take 1 tablet (2 mg total) by mouth every 8 (eight) hours as needed for muscle spasms.  Marland Kitchen topiramate (TOPAMAX) 100 MG tablet Take 100 mg by mouth 2 (two) times daily.  . [DISCONTINUED] meloxicam (MOBIC) 15 MG tablet Take 1 tablet (15 mg total) by  mouth daily.     Allergies:   Iodinated diagnostic agents; Kenalog [triamcinolone acetonide]; Metrizamide; Other; Penicillins; Symbicort [budesonide-formoterol fumarate]; Ciprofloxacin; Diazepam; and Sulfa antibiotics   Social History   Socioeconomic History  . Marital status: Divorced    Spouse name: Not on file  . Number of children: Not on file  . Years of education: Not on file  . Highest education level: Not on file  Occupational History  . Not on file  Social Needs  . Financial resource strain: Not on file  . Food insecurity:    Worry: Not on file    Inability: Not on file  . Transportation needs:    Medical: Not on file    Non-medical: Not on file  Tobacco Use  . Smoking status: Former Research scientist (life sciences)  . Smokeless tobacco: Never Used  Substance and Sexual Activity  . Alcohol use: Yes  . Drug use: No  . Sexual activity: Never    Partners: Male  Lifestyle  . Physical activity:    Days per week: Not on file    Minutes per session: Not on file  . Stress: Not on file  Relationships  . Social connections:    Talks on phone: Not on file    Gets together: Not on file    Attends religious service: Not on file    Active member of club or organization: Not on file    Attends meetings of clubs or organizations: Not on file    Relationship status: Not on file  Other Topics Concern  . Not on file  Social History Narrative  . Not on file     Family History: The patient's family history includes Alzheimer's disease in her mother; Angina in her father; Asthma in her father; Cancer in her sister; Emphysema in her father. ROS:   Please see the history of present illness.    All other systems reviewed and are negative.  EKGs/Labs/Other Studies Reviewed:    The following studies were reviewed today:  EKG:  EKG ordered today.  The ekg ordered today demonstrates sinus rhythm nonspecific T waves otherwise normal MPI 03/15/17:  Overall Study Impression Probable normal perfusion and  soft tissue attenuation (breast) No ischemia This is a low risk study. Overall left ventricular systolic function was normal. LV cavity size is normal. Nuclear stress EF: 56%.    Recent Labs: 04/12/2017: TSH 3.11  Recent Lipid Panel No results found for: CHOL, TRIG, HDL, CHOLHDL, VLDL, LDLCALC, LDLDIRECT  Physical Exam:    VS:  BP 110/76 (BP Location: Right Arm, Patient Position: Sitting, Cuff Size: Normal)   Pulse 63   Ht 5\' 6"  (1.676 m)   Wt 144 lb 12.8 oz (65.7 kg)   SpO2 98%   BMI 23.37 kg/m     Wt Readings from Last 3 Encounters:  09/03/17 144 lb 12.8 oz (65.7 kg)  08/24/17 145 lb (65.8 kg)  08/06/17 144 lb 12.8 oz (65.7 kg)     GEN:  Well nourished, well developed in no acute distress HEENT: Normal NECK: No JVD; No carotid bruits LYMPHATICS: No lymphadenopathy CARDIAC: RRR, no murmurs, rubs, gallops mild left CCJ tenderness RESPIRATORY:  Clear to auscultation without rales, wheezing or rhonchi  ABDOMEN: Soft, non-tender, non-distended MUSCULOSKELETAL:  No edema; No deformity  SKIN: Warm and dry NEUROLOGIC:  Alert and oriented x 3 PSYCHIATRIC:  Normal affect    Signed, Shirlee More, MD  09/03/2017 9:39 AM    [place]

## 2017-09-03 ENCOUNTER — Ambulatory Visit (INDEPENDENT_AMBULATORY_CARE_PROVIDER_SITE_OTHER): Payer: PPO | Admitting: Cardiology

## 2017-09-03 ENCOUNTER — Encounter: Payer: Self-pay | Admitting: Cardiology

## 2017-09-03 VITALS — BP 110/76 | HR 63 | Ht 66.0 in | Wt 144.8 lb

## 2017-09-03 DIAGNOSIS — R002 Palpitations: Secondary | ICD-10-CM | POA: Insufficient documentation

## 2017-09-03 DIAGNOSIS — I10 Essential (primary) hypertension: Secondary | ICD-10-CM | POA: Diagnosis not present

## 2017-09-03 DIAGNOSIS — M940 Chondrocostal junction syndrome [Tietze]: Secondary | ICD-10-CM | POA: Diagnosis not present

## 2017-09-03 HISTORY — DX: Palpitations: R00.2

## 2017-09-03 MED ORDER — MELOXICAM 15 MG PO TABS: 15.0000 mg | ORAL_TABLET | Freq: Every day | ORAL | 2 refills | Status: DC

## 2017-09-03 NOTE — Patient Instructions (Signed)

## 2017-09-24 DIAGNOSIS — R51 Headache: Secondary | ICD-10-CM | POA: Diagnosis not present

## 2017-09-24 DIAGNOSIS — M545 Low back pain: Secondary | ICD-10-CM | POA: Diagnosis not present

## 2017-10-01 DIAGNOSIS — F332 Major depressive disorder, recurrent severe without psychotic features: Secondary | ICD-10-CM | POA: Diagnosis not present

## 2017-10-11 ENCOUNTER — Ambulatory Visit (INDEPENDENT_AMBULATORY_CARE_PROVIDER_SITE_OTHER): Payer: PPO | Admitting: Family Medicine

## 2017-10-11 ENCOUNTER — Encounter: Payer: Self-pay | Admitting: Family Medicine

## 2017-10-11 VITALS — BP 138/80 | HR 60 | Temp 98.1°F | Ht 66.0 in | Wt 144.4 lb

## 2017-10-11 DIAGNOSIS — F5101 Primary insomnia: Secondary | ICD-10-CM

## 2017-10-11 DIAGNOSIS — H9313 Tinnitus, bilateral: Secondary | ICD-10-CM

## 2017-10-11 DIAGNOSIS — G44209 Tension-type headache, unspecified, not intractable: Secondary | ICD-10-CM | POA: Diagnosis not present

## 2017-10-11 DIAGNOSIS — G4700 Insomnia, unspecified: Secondary | ICD-10-CM | POA: Diagnosis not present

## 2017-10-11 HISTORY — DX: Insomnia, unspecified: G47.00

## 2017-10-11 HISTORY — DX: Tinnitus, bilateral: H93.13

## 2017-10-11 HISTORY — DX: Primary insomnia: F51.01

## 2017-10-11 MED ORDER — KETOROLAC TROMETHAMINE 30 MG/ML IJ SOLN
30.0000 mg | Freq: Once | INTRAMUSCULAR | Status: AC
  Administered 2017-10-11: 30 mg via INTRAMUSCULAR

## 2017-10-11 NOTE — Progress Notes (Signed)
Chief Complaint  Patient presents with  . Follow-up     Jenna Campbell is a 67 y.o. female here for evaluation of an acute headache.  Duration: 3 days Laterality: left Quality:  Severity: 8/10 Associated symptoms: eye pressure (neurologist recommends she see eye provider) Therapies tried: Tylenol Hx of migraines- No. She does not chew gum, but does grind her teeth.  The patient has been having ringing in both of her ears.  No loud noise exposure.  She did see her neurologist in the last couple weeks who stated that she should decrease her Topamax.  She has been having issues with falling asleep.  She does follow with Dr. Erling Cruz of psychiatry.  She takes Restoril for that.  There appear to be some sleep hygiene issues such as her partner watching TV before bed and wanting her to rub his back.  She does not currently follow with anyone performing cognitive behavioral therapy.   ROS:  Neuro: +HA MSK: no neck pain  Past Medical History:  Diagnosis Date  . Adjustment disorder with anxious mood 04/21/2015  . Allergic rhinitis due to pollen 07/23/2014  . Alopecia 10/30/2013  . Arthritis 04/24/2016  . Atrophic vaginitis 10/30/2013  . Basal cell carcinoma 04/21/2015  . Cancer ([place])    skin cancer  . Cervical radiculopathy 09/17/2015  . Change in bowel function 06/14/2016  . Chronic diarrhea 12/16/2015  . Cystocele, midline 10/30/2013  . Dermatitis 09/22/2014  . Diverticulosis of large intestine 10/30/2013  . Dyspareunia 07/23/2014  . Essential hypertension   . Family history of pheochromocytoma 02/14/2016  . GERD (gastroesophageal reflux disease)   . Herpes simplex 08/17/2016  . History of adenomatous polyp of colon 06/14/2016  . History of blood transfusion 1970  . Hypothyroidism 10/30/2013  . Hypotonic bladder 10/30/2013  . Lumbar paraspinal muscle spasm 04/21/2015  . Major depression, recurrent ([place]) 07/14/2013  . Malaise and fatigue 04/21/2015  . MCI (mild cognitive impairment) 08/18/2015  .  Menopause 03/31/2014  . Migraine   . Mild persistent asthma 05/13/2015  . Neuropathy 09/22/2014  . Personality disorder ([place]) 10/19/2012  . Pharyngoesophageal dysphagia 04/21/2015  . Plantar fasciitis 01/20/2016  . Precordial chest pain 08/18/2015  . Prolapse of vaginal vault after hysterectomy 10/30/2013  . Rectocele 10/30/2013  . Sinusitis, chronic 10/30/2013  . Slow transit constipation 07/23/2014  . Swelling of lower limb 05/17/2016  . Tension-type headache, not intractable 09/17/2015  . Urethral stricture 10/30/2013  . Urinary incontinence without sensory awareness 07/17/2016  . Varicose veins of both lower extremities 04/24/2016  . Venous insufficiency (chronic) (peripheral) 06/06/2016   Allergies as of 10/11/2017      Reactions   Iodinated Diagnostic Agents Itching, Other (See Comments), Rash, Shortness Of Breath   Throat Swelling, Erythema   Kenalog [triamcinolone Acetonide] Swelling   Metrizamide Itching, Other (See Comments), Rash, Shortness Of Breath   Throat Swelling, Erythema   Other Itching, Swelling   DUST   Penicillins    Symbicort [budesonide-formoterol Fumarate]    Ciprofloxacin Itching   Diazepam Rash   Sulfa Antibiotics Rash      Medication List        Accurate as of 10/11/17  5:02 PM. Always use your most recent med list.          albuterol 108 (90 Base) MCG/ACT inhaler Commonly known as:  PROAIR HFA Inhale 2 puffs into the lungs every 4 (four) hours as needed for wheezing or shortness of breath.   albuterol (2.5 MG/3ML) 0.083% nebulizer solution  Commonly known as:  PROVENTIL Take 3 mLs (2.5 mg total) by nebulization every 6 (six) hours as needed for wheezing or shortness of breath.   clobetasol cream 0.05 % Commonly known as:  TEMOVATE Apply 1 application topically 2 (two) times daily. For 10 days   fexofenadine 180 MG tablet Commonly known as:  ALLEGRA Take 180 mg by mouth daily.   fluticasone 50 MCG/ACT nasal spray Commonly known as:  FLONASE Place 1  spray into both nostrils daily.   gabapentin 300 MG capsule Commonly known as:  NEURONTIN Take 300 mg by mouth 2 (two) times daily.   levothyroxine 150 MCG tablet Commonly known as:  SYNTHROID, LEVOTHROID Take 1.5 tablets (225 mcg total) by mouth daily before breakfast.   LORazepam 0.5 MG tablet Commonly known as:  ATIVAN Take 0.5 mg by mouth every 8 (eight) hours as needed.   meloxicam 15 MG tablet Commonly known as:  MOBIC Take 1 tablet (15 mg total) by mouth daily.   nitroGLYCERIN 0.4 MG SL tablet Commonly known as:  NITROSTAT Place 1 tablet (0.4 mg total) under the tongue every 5 (five) minutes as needed for chest pain.   omeprazole 40 MG capsule Commonly known as:  PRILOSEC Take 40 mg by mouth daily.   temazepam 30 MG capsule Commonly known as:  RESTORIL :take 1 cap at night for sleep (to replace 15mg  size)   tiZANidine 2 MG tablet Commonly known as:  ZANAFLEX Take 1 tablet (2 mg total) by mouth every 8 (eight) hours as needed for muscle spasms.   topiramate 100 MG tablet Commonly known as:  TOPAMAX Take 100 mg by mouth 2 (two) times daily.       BP 138/80 (BP Location: Left Arm, Patient Position: Sitting, Cuff Size: Normal)   Pulse 60   Temp 98.1 F (36.7 C) (Oral)   Ht 5\' 6"  (1.676 m)   Wt 144 lb 6 oz (65.5 kg)   SpO2 98%   BMI 23.30 kg/m  Gen: awake, alert, appearing stated age Eyes: PERRLA, EOMi, no injection Heart: RRR Lungs: CTAB, no accessory muscle use Abd: BS+, soft, NT, ND Neuro: CN2-12 grossly intact, fluent and goal-oriented speech, DTR's equal and symmetric in UE's and LE's MSK: 5/5 strength throughout, no TTP over cervical paraspinal musculature or occipital triangle region; +TTP over L TMJ Psych: Age appropriate judgment and insight, normal affect and mood  Acute non intractable tension-type headache - Plan: ketorolac (TORADOL) 30 MG/ML injection 30 mg  Tinnitus of both ears  Insomnia, unspecified type  Orders as above. Information  given for TMJ syndrome. For ringing of ears, let us see how decreasing Topamax does over the next 4 weeks.  If no improvement, will reexamine and discuss ENT referral. Sleep hygiene verbalized and written down.  Number for cognitive behavioral therapy also provided.  If no improvement, follow-up with Dr. Erling Cruz. F/u in 4 weeks. The pt voiced understanding and agreement to the plan.  [place], DO 10/11/17 5:02 PM

## 2017-10-11 NOTE — Progress Notes (Signed)
Pre visit review using our clinic review tool, if applicable. No additional management support is needed unless otherwise documented below in the visit note. 

## 2017-10-11 NOTE — Patient Instructions (Addendum)
Neosporin/triple antibiotic twice daily for 7 days on area under breast.   Temporomandibular Joint Syndrome Temporomandibular joint (TMJ) syndrome is a condition that affects the joints between your jaw and your skull. The TMJs are located near your ears and allow your jaw to open and close. These joints and the nearby muscles are involved in all movements of the jaw. People with TMJ syndrome have pain in the area of these joints and muscles. Chewing, biting, or other movements of the jaw can be difficult or painful. TMJ syndrome can be caused by various things. In many cases, the condition is mild and goes away within a few weeks. For some people, the condition can become a long-term problem. What are the causes? Possible causes of TMJ syndrome include:  Grinding your teeth or clenching your jaw. Some people do this when they are under stress.  Arthritis.  Injury to the jaw.  Head or neck injury.  Teeth or dentures that are not aligned well.  In some cases, the cause of TMJ syndrome may not be known. What are the signs or symptoms? The most common symptom is an aching pain on the side of the head in the area of the TMJ. Other symptoms may include:  Pain when moving your jaw, such as when chewing or biting.  Being unable to open your jaw all the way.  Making a clicking sound when you open your mouth.  Headache.  Earache.  Neck or shoulder pain.  How is this diagnosed? Diagnosis can usually be made based on your symptoms, your medical history, and a physical exam. Your health care provider may check the range of motion of your jaw. Imaging tests, such as X-rays or an MRI, are sometimes done. You may need to see your dentist to determine if your teeth and jaw are lined up correctly. How is this treated? TMJ syndrome often goes away on its own. If treatment is needed, the options may include:  Eating soft foods and applying ice or heat.  Medicines to relieve pain or  inflammation.  Medicines to relax the muscles.  A splint, bite plate, or mouthpiece to prevent teeth grinding or jaw clenching.  Relaxation techniques or counseling to help reduce stress.  Transcutaneous electrical nerve stimulation (TENS). This helps to relieve pain by applying an electrical current through the skin.  Acupuncture. This is sometimes helpful to relieve pain.  Jaw surgery. This is rarely needed.  Follow these instructions at home:  Take medicines only as directed by your health care provider.  Eat a soft diet if you are having trouble chewing.  Apply ice to the painful area. ? Put ice in a plastic bag. ? Place a towel between your skin and the bag. ? Leave the ice on for 20 minutes, 2-3 times a day.  Apply a warm compress to the painful area as directed.  Massage your jaw area and perform any jaw stretching exercises as recommended by your health care provider.  If you were given a mouthpiece or bite plate, wear it as directed.  Avoid foods that require a lot of chewing. Do not chew gum.  Keep all follow-up visits as directed by your health care provider. This is important. Contact a health care provider if:  You are having trouble eating.  You have new or worsening symptoms. Get help right away if:  Your jaw locks open or closed. This information is not intended to replace advice given to you by your health care provider. Make  sure you discuss any questions you have with your health care provider. Document Released: 01/24/2001 Document Revised: 12/30/2015 Document Reviewed: 12/04/2013 Elsevier Interactive Patient Education  2018 [employer].  Sleep Hygiene Tips:  Do not watch TV or look at screens within 1 hour of going to bed. If you do, make sure there is a blue light filter (nighttime mode) involved.  Try to go to bed around the same time every night. Wake up at the same time within 1 hour of regular time. Ex: If you wake up at 7 AM for work, do  not sleep past 8 AM on days that you don't work.  Do not drink alcohol before bedtime.  Do not consume caffeine-containing beverages after noon or within 9 hours of intended bedtime.  Get regular exercise/physical activity in your life, but not within 2 hours of planned bedtime.  Do not take naps.   Do not eat within 2 hours of planned bedtime.  Melatonin, 3-5 mg 30-60 minutes before planned bedtime may be helpful.   The bed should be for sleep or sex only. If after 20-30 minutes you are unable to fall asleep, get up and do something relaxing. Do this until you feel ready to go to sleep again.   Sleep is important to [place] all. Getting good sleep is imperative to adequate functioning during the day. Work with our counselors who are trained to help people obtain quality sleep. Call [phone] to schedule an appointment or if you are curious about insurance coverage/cost.  Let us know if you need anything.

## 2017-10-15 DIAGNOSIS — F4322 Adjustment disorder with anxiety: Secondary | ICD-10-CM | POA: Diagnosis not present

## 2017-10-15 DIAGNOSIS — F449 Dissociative and conversion disorder, unspecified: Secondary | ICD-10-CM | POA: Diagnosis not present

## 2017-10-15 DIAGNOSIS — F330 Major depressive disorder, recurrent, mild: Secondary | ICD-10-CM | POA: Diagnosis not present

## 2017-10-16 DIAGNOSIS — H2513 Age-related nuclear cataract, bilateral: Secondary | ICD-10-CM | POA: Diagnosis not present

## 2017-10-19 ENCOUNTER — Encounter: Payer: Self-pay | Admitting: Family Medicine

## 2017-10-19 ENCOUNTER — Ambulatory Visit: Payer: Self-pay

## 2017-10-19 ENCOUNTER — Ambulatory Visit (INDEPENDENT_AMBULATORY_CARE_PROVIDER_SITE_OTHER): Payer: PPO | Admitting: Family Medicine

## 2017-10-19 VITALS — BP 112/70 | HR 61 | Temp 98.0°F | Ht 66.0 in | Wt 146.4 lb

## 2017-10-19 DIAGNOSIS — L03313 Cellulitis of chest wall: Secondary | ICD-10-CM | POA: Diagnosis not present

## 2017-10-19 MED ORDER — DOXYCYCLINE HYCLATE 100 MG PO TABS: 100.0000 mg | ORAL_TABLET | Freq: Two times a day (BID) | ORAL | 0 refills | Status: DC

## 2017-10-19 NOTE — Progress Notes (Signed)
Chief Complaint  Patient presents with  . Insect Bite    under left breast    Jenna Campbell is a 67 y.o. female here for a skin complaint.  Duration: 10 days Location: under L breast Pruritic? No Painful? Yes Drainage? No Other associated symptoms: growing Therapies tried thus far: topical abx  ROS:  Const: No fevers Skin: As noted in HPI  Past Medical History:  Diagnosis Date  . Adjustment disorder with anxious mood 04/21/2015  . Allergic rhinitis due to pollen 07/23/2014  . Alopecia 10/30/2013  . Arthritis 04/24/2016  . Atrophic vaginitis 10/30/2013  . Basal cell carcinoma 04/21/2015  . Cancer ([place])    skin cancer  . Cervical radiculopathy 09/17/2015  . Change in bowel function 06/14/2016  . Chronic diarrhea 12/16/2015  . Cystocele, midline 10/30/2013  . Dermatitis 09/22/2014  . Diverticulosis of large intestine 10/30/2013  . Dyspareunia 07/23/2014  . Essential hypertension   . Family history of pheochromocytoma 02/14/2016  . GERD (gastroesophageal reflux disease)   . Herpes simplex 08/17/2016  . History of adenomatous polyp of colon 06/14/2016  . History of blood transfusion 1970  . Hypothyroidism 10/30/2013  . Hypotonic bladder 10/30/2013  . Lumbar paraspinal muscle spasm 04/21/2015  . Major depression, recurrent ([place]) 07/14/2013  . Malaise and fatigue 04/21/2015  . MCI (mild cognitive impairment) 08/18/2015  . Menopause 03/31/2014  . Migraine   . Mild persistent asthma 05/13/2015  . Neuropathy 09/22/2014  . Personality disorder ([place]) 10/19/2012  . Pharyngoesophageal dysphagia 04/21/2015  . Plantar fasciitis 01/20/2016  . Precordial chest pain 08/18/2015  . Prolapse of vaginal vault after hysterectomy 10/30/2013  . Rectocele 10/30/2013  . Sinusitis, chronic 10/30/2013  . Slow transit constipation 07/23/2014  . Swelling of lower limb 05/17/2016  . Tension-type headache, not intractable 09/17/2015  . Urethral stricture 10/30/2013  . Urinary incontinence without sensory awareness 07/17/2016  .  Varicose veins of both lower extremities 04/24/2016  . Venous insufficiency (chronic) (peripheral) 06/06/2016   BP 112/70 (BP Location: Left Arm, Patient Position: Sitting, Cuff Size: Normal)   Pulse 61   Temp 98 F (36.7 C) (Oral)   Ht 5\' 6"  (1.676 m)   Wt 146 lb 6 oz (66.4 kg)   SpO2 96%   BMI 23.63 kg/m  Gen: awake, alert, appearing stated age Lungs: No accessory muscle use Skin: Pt examined in presence of female chaperone. See below. No drainage, fluctuance, excoriation. +warmth, +ttp Psych: Age appropriate judgment and insight   L chest wall under breast   Cellulitis of chest wall - Plan: doxycycline (VIBRA-TABS) 100 MG tablet  Orders as above. F/u prn. The patient voiced understanding and agreement to the plan.  [place], DO 10/19/17 2:17 PM

## 2017-10-19 NOTE — Telephone Encounter (Signed)
Pt. Reports she saw her provider on 10/11/17 and showed him a bug bite under her left breast. Reports she was putting antibiotic on the area. It was getting better, but has become worse. More swelling and redness. Area is very painful. Appointment made for today. Reason for Disposition . [1] Red or very tender (to touch) area AND [2] getting larger over 48 hours after the bite  Answer Assessment - Initial Assessment Questions 1. TYPE of INSECT: "What type of insect was it?"      Unsure 2. ONSET: "When did you get bitten?"      Over a week ago 3. LOCATION: "Where is the insect bite located?"      Under left breast 4. REDNESS: "Is the area red or pink?" If so, ask "What size is area of redness?" (inches or cm). "When did the redness start?"     Red 5. PAIN: "Is there any pain?" If so, ask: "How bad is it?"  (Scale 1-10; or mild, moderate, severe)     11 6. ITCHING: "Does it itch?" If so, ask: "How bad is the itch?"    - MILD: doesn't interfere with normal activities   - MODERATE-SEVERE: interferes with work, school, sleep, or other activities      Mild - burns 7. SWELLING: "How big is the swelling?" (inches, cm, or compare to coins)     n/a 8. OTHER SYMPTOMS: "Do you have any other symptoms?"  (e.g., difficulty breathing, hives)     No 9. PREGNANCY: "Is there any chance you are pregnant?" "When was your last menstrual period?"     No  Protocols used: INSECT BITE-A-AH

## 2017-10-19 NOTE — Telephone Encounter (Signed)
Patient has appointment today with Dr. Wendling  

## 2017-10-19 NOTE — Patient Instructions (Signed)
Keep the area clean and dry.   Things to look out for: increasing pain not relieved by ibuprofen/acetaminophen, fevers, spreading redness, drainage of pus, or foul odor.  Let us know if you need anything.  

## 2017-10-19 NOTE — Progress Notes (Signed)
Pre visit review using our clinic review tool, if applicable. No additional management support is needed unless otherwise documented below in the visit note. 

## 2017-10-26 ENCOUNTER — Encounter: Payer: Self-pay | Admitting: Family Medicine

## 2017-10-29 DIAGNOSIS — F332 Major depressive disorder, recurrent severe without psychotic features: Secondary | ICD-10-CM | POA: Diagnosis not present

## 2017-11-12 ENCOUNTER — Encounter: Payer: Self-pay | Admitting: Family Medicine

## 2017-11-12 ENCOUNTER — Ambulatory Visit (INDEPENDENT_AMBULATORY_CARE_PROVIDER_SITE_OTHER): Payer: PPO | Admitting: Family Medicine

## 2017-11-12 VITALS — BP 118/72 | HR 55 | Temp 98.2°F | Ht 66.0 in | Wt 144.2 lb

## 2017-11-12 DIAGNOSIS — M79641 Pain in right hand: Secondary | ICD-10-CM | POA: Diagnosis not present

## 2017-11-12 DIAGNOSIS — J45909 Unspecified asthma, uncomplicated: Secondary | ICD-10-CM | POA: Diagnosis not present

## 2017-11-12 DIAGNOSIS — M79642 Pain in left hand: Secondary | ICD-10-CM | POA: Diagnosis not present

## 2017-11-12 DIAGNOSIS — H9313 Tinnitus, bilateral: Secondary | ICD-10-CM | POA: Diagnosis not present

## 2017-11-12 MED ORDER — MONTELUKAST SODIUM 10 MG PO TABS: 10.0000 mg | ORAL_TABLET | Freq: Every day | ORAL | 3 refills | Status: DC

## 2017-11-12 MED ORDER — ALBUTEROL SULFATE (2.5 MG/3ML) 0.083% IN NEBU: 2.5000 mg | INHALATION_SOLUTION | Freq: Four times a day (QID) | RESPIRATORY_TRACT | 0 refills | Status: DC | PRN

## 2017-11-12 NOTE — Progress Notes (Signed)
Chief Complaint  Patient presents with  . Follow-up    chest rash    Subjective: Patient is a 67 y.o. female here for ringing in ears.  Affecting b/l ears, worse on the R. This has been going on for many years, worse over past month. No loud noise exposure. +hx of head injuries. Hearing is unchanged.  Has been having chest tightness, throat tightness with drainage over past couple weeks. Took nitro to no avail. Took Benadryl that took some time to work. Has used dextromethorphan without relief.  She does use Allegra.   She is also been waking up with bilateral hand pain/weakness.  Her cardiologist is aware of this issue and told her to follow-up with her neurologist.  She has not done this yet.  No recent injury or change in activity.  She is finding it difficult to do daily activities such as opening jars.  ROS: Heart: Denies chest pain  Lungs: Denies SOB   Past Medical History:  Diagnosis Date  . Adjustment disorder with anxious mood 04/21/2015  . Allergic rhinitis due to pollen 07/23/2014  . Alopecia 10/30/2013  . Arthritis 04/24/2016  . Atrophic vaginitis 10/30/2013  . Basal cell carcinoma 04/21/2015  . Cancer ([place])    skin cancer  . Cervical radiculopathy 09/17/2015  . Change in bowel function 06/14/2016  . Chronic diarrhea 12/16/2015  . Cystocele, midline 10/30/2013  . Dermatitis 09/22/2014  . Diverticulosis of large intestine 10/30/2013  . Dyspareunia 07/23/2014  . Essential hypertension   . Family history of pheochromocytoma 02/14/2016  . GERD (gastroesophageal reflux disease)   . Herpes simplex 08/17/2016  . History of adenomatous polyp of colon 06/14/2016  . History of blood transfusion 1970  . Hypothyroidism 10/30/2013  . Hypotonic bladder 10/30/2013  . Lumbar paraspinal muscle spasm 04/21/2015  . Major depression, recurrent ([place]) 07/14/2013  . Malaise and fatigue 04/21/2015  . MCI (mild cognitive impairment) 08/18/2015  . Menopause 03/31/2014  . Migraine   . Mild persistent asthma  05/13/2015  . Neuropathy 09/22/2014  . Personality disorder ([place]) 10/19/2012  . Pharyngoesophageal dysphagia 04/21/2015  . Plantar fasciitis 01/20/2016  . Precordial chest pain 08/18/2015  . Prolapse of vaginal vault after hysterectomy 10/30/2013  . Rectocele 10/30/2013  . Sinusitis, chronic 10/30/2013  . Slow transit constipation 07/23/2014  . Swelling of lower limb 05/17/2016  . Tension-type headache, not intractable 09/17/2015  . Urethral stricture 10/30/2013  . Urinary incontinence without sensory awareness 07/17/2016  . Varicose veins of both lower extremities 04/24/2016  . Venous insufficiency (chronic) (peripheral) 06/06/2016   Family History  Problem Relation Age of Onset  . Asthma Father   . Angina Father   . Emphysema Father   . Alzheimer's disease Mother   . Cancer Sister        died of cancer   Allergies as of 11/12/2017      Reactions   Iodinated Diagnostic Agents Itching, Other (See Comments), Rash, Shortness Of Breath   Throat Swelling, Erythema   Kenalog [triamcinolone Acetonide] Swelling   Metrizamide Itching, Other (See Comments), Rash, Shortness Of Breath   Throat Swelling, Erythema   Other Itching, Swelling   DUST   Penicillins    Symbicort [budesonide-formoterol Fumarate]    Ciprofloxacin Itching   Diazepam Rash   Sulfa Antibiotics Rash      Medication List        Accurate as of 11/12/17  1:40 PM. Always use your most recent med list.  albuterol 108 (90 Base) MCG/ACT inhaler Commonly known as:  PROAIR HFA Inhale 2 puffs into the lungs every 4 (four) hours as needed for wheezing or shortness of breath.   albuterol (2.5 MG/3ML) 0.083% nebulizer solution Commonly known as:  PROVENTIL Take 3 mLs (2.5 mg total) by nebulization every 6 (six) hours as needed for wheezing or shortness of breath.   clobetasol cream 0.05 % Commonly known as:  TEMOVATE Apply 1 application topically 2 (two) times daily. For 10 days   fexofenadine 180 MG tablet Commonly known  as:  ALLEGRA Take 180 mg by mouth daily.   fluticasone 50 MCG/ACT nasal spray Commonly known as:  FLONASE Place 1 spray into both nostrils daily.   gabapentin 300 MG capsule Commonly known as:  NEURONTIN Take 300 mg by mouth 2 (two) times daily.   levothyroxine 150 MCG tablet Commonly known as:  SYNTHROID, LEVOTHROID Take 1.5 tablets (225 mcg total) by mouth daily before breakfast.   LORazepam 0.5 MG tablet Commonly known as:  ATIVAN Take 0.5 mg by mouth every 8 (eight) hours as needed.   meloxicam 15 MG tablet Commonly known as:  MOBIC Take 1 tablet (15 mg total) by mouth daily.   montelukast 10 MG tablet Commonly known as:  SINGULAIR Take 1 tablet (10 mg total) by mouth at bedtime.   nitroGLYCERIN 0.4 MG SL tablet Commonly known as:  NITROSTAT Place 1 tablet (0.4 mg total) under the tongue every 5 (five) minutes as needed for chest pain.   omeprazole 40 MG capsule Commonly known as:  PRILOSEC Take 40 mg by mouth daily.   temazepam 30 MG capsule Commonly known as:  RESTORIL :take 1 cap at night for sleep (to replace 15mg  size)   tiZANidine 2 MG tablet Commonly known as:  ZANAFLEX Take 1 tablet (2 mg total) by mouth every 8 (eight) hours as needed for muscle spasms.   topiramate 100 MG tablet Commonly known as:  TOPAMAX Take 100 mg by mouth 2 (two) times daily.            Durable Medical Equipment  (From admission, onward)        Start     Ordered   11/12/17 0000  DME Nebulizer machine    Question:  Patient needs a nebulizer to treat with the following condition  Answer:  Asthma   11/12/17 0903      Objective: BP 118/72 (BP Location: Left Arm, Patient Position: Sitting, Cuff Size: Normal)   Pulse (!) 55   Temp 98.2 F (36.8 C) (Oral)   Ht 5\' 6"  (1.676 m)   Wt 144 lb 4 oz (65.4 kg)   SpO2 98%   BMI 23.28 kg/m  General: Awake, appears stated age HEENT: MMM, EOMi, ears neg b/l, nares patent w/o dc MSK: No edema/effusion, erythema, warmth or ttp  over hands b/l Neck: No asymmetry Heart: RRR, no bruits, no LE edema Lungs: CTAB, no rales, wheezes or rhonchi. No accessory muscle use Psych: Age appropriate judgment and insight, normal affect and mood  Assessment and Plan: Tinnitus of both ears - Plan: Ambulatory referral to ENT  Severe asthma without complication, unspecified whether persistent - Plan: DME Nebulizer machine  Bilateral hand pain - Plan: Ambulatory referral to Occupational Therapy  #1- refer # 2- go back on Flonase, trial neb machine #3- inform Dr. Everette Rank of Neuro team. Try OT. F/u in 6 mo.  The patient voiced understanding and agreement to the plan.  [place], DO 11/12/17  1:40 PM

## 2017-11-12 NOTE — Progress Notes (Signed)
Pre visit review using our clinic review tool, if applicable. No additional management support is needed unless otherwise documented below in the visit note. 

## 2017-11-12 NOTE — Patient Instructions (Signed)
If you do not hear anything about your referral in the next 1-2 weeks, call our office and ask for an update.  Check with Dr. Everette Rank about your hand weakness.  Go back on Flonase.  Let us know if you need anything.

## 2017-11-21 ENCOUNTER — Telehealth: Payer: Self-pay

## 2017-11-21 NOTE — Telephone Encounter (Signed)
Copied from [place] [id number]. Topic: Referral - Question >> Nov 21, 2017  9:45 AM Bea Graff, NT wrote: Reason for CRM: Pt states she did not want the referrals for ENT and PT sent to [facility]. She would like the referrals resent to any doctor not within [facility].

## 2017-11-21 NOTE — Telephone Encounter (Signed)
Great. Please update pt with change. TY.

## 2017-11-21 NOTE — Telephone Encounter (Signed)
OK.  GD- please help.

## 2017-11-21 NOTE — Telephone Encounter (Signed)
Referral has been changed to [facility], and [facility]

## 2017-11-23 ENCOUNTER — Telehealth: Payer: Self-pay

## 2017-11-23 NOTE — Telephone Encounter (Signed)
Based on her Vit D levels, she doesn't need to. 800 units of Vit D2 or D3 daily wouldn't hurt anything if she wants to take something. TY.

## 2017-11-23 NOTE — Telephone Encounter (Signed)
Pt informed

## 2017-11-23 NOTE — Telephone Encounter (Signed)
Pt would like to know if she should be taking a otc vitamin d supplement due to her age and if so what would be recommended.   Last Vit D checked 04/12/2017: 71.77  Please advise.

## 2017-11-26 NOTE — Telephone Encounter (Signed)
Called left message to call back 

## 2017-11-27 NOTE — Telephone Encounter (Signed)
Patient notified about the vitamin d

## 2017-11-28 ENCOUNTER — Emergency Department (HOSPITAL_BASED_OUTPATIENT_CLINIC_OR_DEPARTMENT_OTHER): Payer: PPO

## 2017-11-28 ENCOUNTER — Ambulatory Visit: Payer: Self-pay | Admitting: *Deleted

## 2017-11-28 ENCOUNTER — Encounter (HOSPITAL_BASED_OUTPATIENT_CLINIC_OR_DEPARTMENT_OTHER): Payer: Self-pay | Admitting: Emergency Medicine

## 2017-11-28 ENCOUNTER — Other Ambulatory Visit: Payer: Self-pay

## 2017-11-28 ENCOUNTER — Emergency Department (HOSPITAL_BASED_OUTPATIENT_CLINIC_OR_DEPARTMENT_OTHER)
Admission: EM | Admit: 2017-11-28 | Discharge: 2017-11-28 | Disposition: A | Discharge status: Home / Self Care | Payer: PPO | Attending: Emergency Medicine | Admitting: Emergency Medicine

## 2017-11-28 DIAGNOSIS — E039 Hypothyroidism, unspecified: Secondary | ICD-10-CM | POA: Insufficient documentation

## 2017-11-28 DIAGNOSIS — Z87891 Personal history of nicotine dependence: Secondary | ICD-10-CM | POA: Diagnosis not present

## 2017-11-28 DIAGNOSIS — R0789 Other chest pain: Secondary | ICD-10-CM | POA: Insufficient documentation

## 2017-11-28 DIAGNOSIS — R079 Chest pain, unspecified: Secondary | ICD-10-CM | POA: Diagnosis not present

## 2017-11-28 DIAGNOSIS — Z79899 Other long term (current) drug therapy: Secondary | ICD-10-CM | POA: Insufficient documentation

## 2017-11-28 DIAGNOSIS — E119 Type 2 diabetes mellitus without complications: Secondary | ICD-10-CM | POA: Diagnosis not present

## 2017-11-28 LAB — COMPREHENSIVE METABOLIC PANEL
ALT: 16 U/L (ref 0–44)
AST: 21 U/L (ref 15–41)
Albumin: 4.1 g/dL (ref 3.5–5.0)
Alkaline Phosphatase: 62 U/L (ref 38–126)
Anion gap: 8 (ref 5–15)
BUN: 11 mg/dL (ref 8–23)
CO2: 25 mmol/L (ref 22–32)
Calcium: 8.7 mg/dL — ABNORMAL LOW (ref 8.9–10.3)
Chloride: 102 mmol/L (ref 98–111)
Creatinine, Ser: 0.62 mg/dL (ref 0.44–1.00)
GFR calc Af Amer: >60 mL/min (ref >60)
GFR calc non Af Amer: >60 mL/min (ref >60)
Glucose, Bld: 115 mg/dL — ABNORMAL HIGH (ref 70–99)
Potassium: 3.9 mmol/L (ref 3.5–5.1)
Sodium: 135 mmol/L (ref 135–145)
Total Bilirubin: 0.4 mg/dL (ref 0.3–1.2)
Total Protein: 7.3 g/dL (ref 6.5–8.1)

## 2017-11-28 LAB — CBC
HCT: 40.3 % (ref 36.0–46.0)
Hemoglobin: 14 g/dL (ref 12.0–15.0)
MCH: 30.1 pg (ref 26.0–34.0)
MCHC: 34.7 g/dL (ref 30.0–36.0)
MCV: 86.7 fL (ref 78.0–100.0)
Platelets: 288 10*3/uL (ref 150–400)
RBC: 4.65 MIL/uL (ref 3.87–5.11)
RDW: 12.8 % (ref 11.5–15.5)
WBC: 6.9 10*3/uL (ref 4.0–10.5)

## 2017-11-28 LAB — TROPONIN I
Troponin I: <0.03 ng/mL (ref <0.03)
Troponin I: <0.03 ng/mL (ref <0.03)

## 2017-11-28 MED ORDER — HYDROCODONE-ACETAMINOPHEN 5-325 MG PO TABS: 1.0000 | ORAL_TABLET | ORAL | 0 refills | Status: DC | PRN

## 2017-11-28 MED ORDER — MORPHINE SULFATE (PF) 4 MG/ML IV SOLN
4.0000 mg | Freq: Once | INTRAVENOUS | Status: AC
  Administered 2017-11-28: 4 mg via INTRAVENOUS
  Filled 2017-11-28: qty 1

## 2017-11-28 MED ORDER — KETOROLAC TROMETHAMINE 30 MG/ML IJ SOLN
30.0000 mg | Freq: Once | INTRAMUSCULAR | Status: AC
  Administered 2017-11-28: 30 mg via INTRAVENOUS
  Filled 2017-11-28: qty 1

## 2017-11-28 MED ORDER — ONDANSETRON HCL 4 MG/2ML IJ SOLN
4.0000 mg | Freq: Once | INTRAMUSCULAR | Status: AC
  Administered 2017-11-28: 4 mg via INTRAVENOUS
  Filled 2017-11-28: qty 2

## 2017-11-28 MED ORDER — LORAZEPAM 2 MG/ML IJ SOLN
0.5000 mg | Freq: Once | INTRAMUSCULAR | Status: DC
  Filled 2017-11-28: qty 1

## 2017-11-28 NOTE — Telephone Encounter (Signed)
Pt calling with complaints of chest pain for the past couple of days that is in the middle of her chest and to the left side of chest. Pt states she she has taken 2 nitro today before this afternoon and the pain has eased some. Pt states that the pain comes and goes but has been increasing in frequency and gets worse with activity. Pt describes the pain as a "pressure" feeling and rates it 8-9. Pt states she has been taking nitro for the past couple of days with little improvement of pain.Pt denies any difficulty breathing, dizziness or nausea at this time. Pt states BP today was 145/80 and P-50 and last night BP was 162/80 and P-51. Pt advised to seek treatment in the ED. Pt is at home with husband and is hesitant to go to the ED for current symptoms. Pt then states that she is not having chest pain at this time and that she rated it 8-9 when she experienced it earlier today.Pt encouraged again to seek treatment in the ED.Pt verbalized understanding.  Reason for Disposition . [1] Chest pain lasts > 5 minutes AND [2] not relieved with nitroglycerin  Answer Assessment - Initial Assessment Questions 1. LOCATION: "Where does it hurt?"       2. RADIATION: "Does the pain go anywhere else?" (e.g., into neck, jaw, arms, back)      3. ONSET: "When did the chest pain begin?" (Minutes, hours or days)      4. PATTERN "Does the pain come and go, or has it been constant since it started?"  "Does it get worse with exertion?"      Worse with activity and is coming more frequently 5. DURATION: "How long does it last" (e.g., seconds, minutes, hours)     *No Answer* 6. SEVERITY: "How bad is the pain?"  (e.g., Scale 1-10; mild, moderate, or severe)    - MILD (1-3): doesn't interfere with normal activities     - MODERATE (4-7): interferes with normal activities or awakens from sleep    - SEVERE (8-10): excruciating pain, unable to do any normal activities        7. CARDIAC RISK FACTORS: "Do you have any history of  heart problems or risk factors for heart disease?" (e.g., prior heart attack, angina; high blood pressure, diabetes, being overweight, high cholesterol, smoking, or strong family history of heart disease)     Angina, high blood pressure 8. PULMONARY RISK FACTORS: "Do you have any history of lung disease?"  (e.g., blood clots in lung, asthma, emphysema, birth control pills)     Asthma 9. CAUSE: "What do you think is causing the chest pain?"      10. OTHER SYMPTOMS: "Do you have any other symptoms?" (e.g., dizziness, nausea, vomiting, sweating, fever, difficulty breathing, cough)       Feels tired denies any other symptoms 11. PREGNANCY: "Is there any chance you are pregnant?" "When was your last menstrual period?"  Protocols used: CHEST PAIN-A-AH

## 2017-11-28 NOTE — ED Provider Notes (Signed)
[facility] EMERGENCY DEPARTMENT Provider Note   CSN: [id number] Arrival date & time: 11/28/17  1607     History   Chief Complaint Chief Complaint  Patient presents with  . Chest Pain    HPI Jenna Campbell is a 67 y.o. female.  Pt presents to the ED today with left sided cp.  She said it has been going on for several days intermittently.  She has had a cough and sob, but that is normal for her.  She denies any f/c.  She said nitro has not helped pain.  Pt has seen Dr. Bettina Gavia for these sx and has been diagnosed with costocondritis and has been treated with meloxicam.  The pt had a normal nuclear stress test in November of 2018.     Past Medical History:  Diagnosis Date  . Adjustment disorder with anxious mood 04/21/2015  . Allergic rhinitis due to pollen 07/23/2014  . Alopecia 10/30/2013  . Arthritis 04/24/2016  . Atrophic vaginitis 10/30/2013  . Basal cell carcinoma 04/21/2015  . Cancer ([place])    skin cancer  . Cervical radiculopathy 09/17/2015  . Change in bowel function 06/14/2016  . Chronic diarrhea 12/16/2015  . Cystocele, midline 10/30/2013  . Dermatitis 09/22/2014  . Diverticulosis of large intestine 10/30/2013  . Dyspareunia 07/23/2014  . Essential hypertension   . Family history of pheochromocytoma 02/14/2016  . GERD (gastroesophageal reflux disease)   . Herpes simplex 08/17/2016  . History of adenomatous polyp of colon 06/14/2016  . History of blood transfusion 1970  . Hypothyroidism 10/30/2013  . Hypotonic bladder 10/30/2013  . Lumbar paraspinal muscle spasm 04/21/2015  . Major depression, recurrent ([place]) 07/14/2013  . Malaise and fatigue 04/21/2015  . MCI (mild cognitive impairment) 08/18/2015  . Menopause 03/31/2014  . Migraine   . Mild persistent asthma 05/13/2015  . Neuropathy 09/22/2014  . Personality disorder ([place]) 10/19/2012  . Pharyngoesophageal dysphagia 04/21/2015  . Plantar fasciitis 01/20/2016  . Precordial chest pain 08/18/2015  . Prolapse of vaginal vault  after hysterectomy 10/30/2013  . Rectocele 10/30/2013  . Sinusitis, chronic 10/30/2013  . Slow transit constipation 07/23/2014  . Swelling of lower limb 05/17/2016  . Tension-type headache, not intractable 09/17/2015  . Urethral stricture 10/30/2013  . Urinary incontinence without sensory awareness 07/17/2016  . Varicose veins of both lower extremities 04/24/2016  . Venous insufficiency (chronic) (peripheral) 06/06/2016    Patient Active Problem List   Diagnosis Date Noted  . Tinnitus of both ears 10/11/2017  . Insomnia 10/11/2017  . Palpitation 09/03/2017  . Angina pectoris ([place]) 05/28/2017  . Allergy   . Asthma   . Cancer ([place])   . Colon polyps   . Essential hypertension   . GERD (gastroesophageal reflux disease)   . H/O: hysterectomy   . Thyroid disease   . Urinary incontinence   . Nonintractable headache 11/02/2016  . Herpes simplex 08/17/2016  . Shortness of breath 07/17/2016  . Urinary incontinence without sensory awareness 07/17/2016  . Change in bowel function 06/14/2016  . Esophageal dysphagia 06/14/2016  . History of adenomatous polyp of colon 06/14/2016  . Venous insufficiency (chronic) (peripheral) 06/06/2016  . Swelling of lower limb 05/17/2016  . Arthritis 04/24/2016  . Varicose veins of both lower extremities 04/24/2016  . Cough 02/14/2016  . Family history of pheochromocytoma 02/14/2016  . Plantar fasciitis 01/20/2016  . Acute tension-type headache 12/16/2015  . Chronic diarrhea 12/16/2015  . Dysuria 12/16/2015  . Generalized abdominal pain 12/16/2015  . Localized edema 12/16/2015  .  Mild intermittent extrinsic asthma 12/16/2015  . Cervical radiculopathy 09/17/2015  . Neck pain 09/17/2015  . Low back pain 09/17/2015  . Memory difficulty 09/17/2015  . Tension-type headache, not intractable 09/17/2015  . MCI (mild cognitive impairment) 08/18/2015  . Precordial chest pain 08/18/2015  . Mild persistent asthma 05/13/2015  . Other allergic rhinitis 05/13/2015  .  Gastroesophageal reflux disease without esophagitis 05/13/2015  . Allergic urticaria 05/13/2015  . Acute bronchitis due to other specified organisms 04/21/2015  . Adjustment disorder with anxious mood 04/21/2015  . Basal cell carcinoma 04/21/2015  . Bilateral impacted cerumen 04/21/2015  . Lumbar paraspinal muscle spasm 04/21/2015  . Malaise and fatigue 04/21/2015  . Pharyngoesophageal dysphagia 04/21/2015  . Shin splint 04/21/2015  . Pelvic pain in female 02/17/2015  . Allergic asthma with acute exacerbation 09/22/2014  . Dermatitis 09/22/2014  . Neuropathy 09/22/2014  . Allergic rhinitis due to pollen 07/23/2014  . Dyspareunia 07/23/2014  . Slow transit constipation 07/23/2014  . Menopause 03/31/2014  . Midline low back pain without sciatica 03/31/2014  . Alopecia 10/30/2013  . Cystocele, midline 10/30/2013  . Diverticulosis of large intestine 10/30/2013  . Hypothyroidism 10/30/2013  . Hypotonic bladder 10/30/2013  . Atrophic vaginitis 10/30/2013  . Prolapse of vaginal vault after hysterectomy 10/30/2013  . Rectocele 10/30/2013  . Sinusitis, chronic 10/30/2013  . Urethral stricture 10/30/2013  . Major depression, recurrent ([place]) 07/14/2013  . 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 Encounter for long-term (current) use of other medications 10/19/2012  . Migraine 10/19/2012  . Personality disorder ([place]) 10/19/2012  . Hx of migraines 05/15/2009  . History of blood transfusion 05/15/1968    Past Surgical History:  Procedure Laterality Date  . BREAST CYST ASPIRATION Left    25 years ago   . MOHS SURGERY  10/05/2015  . RECTOCELE REPAIR  2011  . SKIN CANCER EXCISION  02/2015   Squamous cell removed from back  . TONSILLECTOMY    . VAGINAL HYSTERECTOMY    . VAGINAL PROLAPSE REPAIR       OB History   None      Home Medications    Prior to Admission medications   Medication Sig Start Date End Date Taking? Authorizing Provider  albuterol (PROAIR HFA) 108 (90 Base)  MCG/ACT inhaler Inhale 2 puffs into the lungs every 4 (four) hours as needed for wheezing or shortness of breath. 06/03/15   Charlies Silvers, MD  albuterol (PROVENTIL) (2.5 MG/3ML) 0.083% nebulizer solution Take 3 mLs (2.5 mg total) by nebulization every 6 (six) hours as needed for wheezing or shortness of breath. 11/12/17   Shelda Pal, DO  clobetasol cream (TEMOVATE) 7.35 % Apply 1 application topically 2 (two) times daily. For 10 days 07/20/17   Shelda Pal, DO  fexofenadine (ALLEGRA) 180 MG tablet Take 180 mg by mouth daily.    [provider]  fluticasone (FLONASE) 50 MCG/ACT nasal spray Place 1 spray into both nostrils daily.    [provider]  gabapentin (NEURONTIN) 300 MG capsule Take 300 mg by mouth 2 (two) times daily.    [provider]  HYDROcodone-acetaminophen (NORCO/VICODIN) 5-325 MG tablet Take 1 tablet by mouth every 4 (four) hours as needed. 11/28/17   Isla Pence, MD  levothyroxine (SYNTHROID, LEVOTHROID) 150 MCG tablet Take 1.5 tablets (225 mcg total) by mouth daily before breakfast. 04/12/17   Wendling, Crosby Oyster, DO  LORazepam (ATIVAN) 0.5 MG tablet Take 0.5 mg by mouth every 8 (eight) hours as needed.  [provider]  meloxicam (MOBIC) 15 MG tablet Take 1 tablet (15 mg total) by mouth daily. 09/03/17   Richardo Priest, MD  montelukast (SINGULAIR) 10 MG tablet Take 1 tablet (10 mg total) by mouth at bedtime. 11/12/17   Shelda Pal, DO  nitroGLYCERIN (NITROSTAT) 0.4 MG SL tablet Place 1 tablet (0.4 mg total) under the tongue every 5 (five) minutes as needed for chest pain. 08/06/17 11/04/17  Richardo Priest, MD  omeprazole (PRILOSEC) 40 MG capsule Take 40 mg by mouth daily.    [provider]  temazepam (RESTORIL) 30 MG capsule :take 1 cap at night for sleep (to replace 15mg  size) 11/27/16   [provider]  tiZANidine (ZANAFLEX) 2 MG tablet Take 1 tablet (2 mg total) by mouth every 8  (eight) hours as needed for muscle spasms. 05/28/17   Shelda Pal, DO  topiramate (TOPAMAX) 100 MG tablet Take 100 mg by mouth 2 (two) times daily.    [provider]    Family History Family History  Problem Relation Age of Onset  . Asthma Father   . Angina Father   . Emphysema Father   . Alzheimer's disease Mother   . Cancer Sister        died of cancer    Social History Social History   Tobacco Use  . Smoking status: Former Research scientist (life sciences)  . Smokeless tobacco: Never Used  Substance Use Topics  . Alcohol use: Yes  . Drug use: No     Allergies   Iodinated diagnostic agents; Kenalog [triamcinolone acetonide]; Metrizamide; Other; Penicillins; Symbicort [budesonide-formoterol fumarate]; Ciprofloxacin; Diazepam; and Sulfa antibiotics   Review of Systems Review of Systems  Respiratory: Positive for shortness of breath.   Cardiovascular: Positive for chest pain.  All other systems reviewed and are negative.    Physical Exam Updated Vital Signs BP (!) 147/70   Pulse (!) 51   Temp 98.7 F (37.1 C)   Resp 20   Ht 5\' 6"  (1.676 m)   Wt 65.3 kg (144 lb)   SpO2 98%   BMI 23.24 kg/m   Physical Exam  Constitutional: She is oriented to person, place, and time. She appears well-developed and well-nourished.  HENT:  Head: Normocephalic and atraumatic.  Eyes: Pupils are equal, round, and reactive to light. EOM are normal.  Neck: Normal range of motion. Neck supple.  Cardiovascular: Normal rate, regular rhythm, intact distal pulses and normal pulses.  Pulmonary/Chest: Effort normal and breath sounds normal.    Abdominal: Soft. Bowel sounds are normal.  Musculoskeletal: Normal range of motion.       Right lower leg: Normal.       Left lower leg: Normal.  Neurological: She is alert and oriented to person, place, and time.  Skin: Skin is warm. Capillary refill takes less than 2 seconds.  Psychiatric: She has a normal mood and affect. Her behavior is normal.    Nursing note and vitals reviewed.    ED Treatments / Results  Labs (all labs ordered are listed, but only abnormal results are displayed) Labs Reviewed  COMPREHENSIVE METABOLIC PANEL - Abnormal; Notable for the following components:      Result Value   Glucose, Bld 115 (*)    Calcium 8.7 (*)    All other components within normal limits  CBC  TROPONIN I  TROPONIN I    EKG EKG Interpretation  Date/Time:  Wednesday November 28 2017 16:17:01 EDT Ventricular Rate:  59 PR Interval:  134 QRS Duration: 104 QT Interval:  488 QTC Calculation: 483 R Axis:   78 Text Interpretation:  Sinus bradycardia Nonspecific T wave abnormality Prolonged QT Abnormal ECG No old tracing to compare Confirmed by Isla Pence [id number]) on 11/28/2017 4:30:22 PM   Radiology Dg Chest 2 View  Result Date: 11/28/2017 CLINICAL DATA:  67 year old female with a history of mid chest pain EXAM: CHEST - 2 VIEW COMPARISON:  None. FINDINGS: Cardiomediastinal silhouette within normal limits. No evidence of central vascular congestion. No pneumothorax or pleural effusion. No confluent airspace disease. No acute displaced fracture. IMPRESSION: Negative for acute cardiopulmonary disease Electronically Signed   By: Corrie Mckusick D.O.   On: 11/28/2017 17:26    Procedures Procedures (including critical care time)  Medications Ordered in ED Medications  LORazepam (ATIVAN) injection 0.5 mg (0.5 mg Intravenous Refused 11/28/17 1901)  ketorolac (TORADOL) 30 MG/ML injection 30 mg (30 mg Intravenous Given 11/28/17 1720)  ondansetron (ZOFRAN) injection 4 mg (4 mg Intravenous Given 11/28/17 1854)  morphine 4 MG/ML injection 4 mg (4 mg Intravenous Given 11/28/17 1854)     Initial Impression / Assessment and Plan / ED Course  I have reviewed the triage vital signs and the nursing notes.  Pertinent labs & imaging results that were available during my care of the patient were reviewed by me and considered in my medical decision  making (see chart for details).    Pt feels much better.  2 negative troponins.  Pain has been going on for several days.  This seems similar to other episodes of cp that pt has had. Pain is very pleuritic in nature.  Pt is followed by Dr. Bettina Gavia and is encouraged to call his office in the morning.  Return if worse.  Final Clinical Impressions(s) / ED Diagnoses   Final diagnoses:  Chest wall pain    ED Discharge Orders        Ordered    HYDROcodone-acetaminophen (NORCO/VICODIN) 5-325 MG tablet  Every 4 hours PRN     11/28/17 2009       Isla Pence, MD 11/28/17 2011

## 2017-11-28 NOTE — ED Triage Notes (Signed)
Chest pain x 2 days.  Some sob with cough, has asthma which bothers her occasionally.  Pain in chest is constant but severity waxes and wanes.  No fever.  Productive cough.  Pt states she has been taking NTG and hasnt helped much until today.

## 2017-11-29 ENCOUNTER — Telehealth: Payer: Self-pay | Admitting: Cardiology

## 2017-11-29 NOTE — Telephone Encounter (Signed)
Patient was in [facility] yesterday and upon discharge they told her to let Dr. Bettina Gavia know she was in there. I informed her it would be in her chart.

## 2017-11-30 ENCOUNTER — Telehealth: Payer: Self-pay | Admitting: Cardiology

## 2017-11-30 NOTE — Telephone Encounter (Signed)
Patient  Called back and concerned cause she is till having tightness in chest. Please call her.

## 2017-11-30 NOTE — Telephone Encounter (Signed)
Patient states she is having constant pressure in the center of her chest rating it on a scale as 5-6 out 10. Patient's blood pressure is 142/72 and heart rate is 60 beats per minute. Patient was seen in the ED on 11/28/17 for chest pain. Patient reports this is not the same type of pain as she had 10 out of 10 left sided chest pain on Wednesday and nitroglycerin did not help. Patient has not taken any nitroglycerin this morning. Advised her to do so as she does not want to go to the emergency department yet because this pressure is not as severe as the episode on Wednesday. Advised if nitroglycerin did not help, it is important that patient go to the emergency department to be evaluated. Patient verbalized understanding. No further questions.

## 2017-12-04 NOTE — Progress Notes (Signed)
Cardiology Office Note:    Date:  12/05/2017   ID:  Jenna Campbell, DOB 11-Feb-1951, MRN [id number]  PCP:  Shelda Pal, DO  Cardiologist:  Shirlee More, MD    Referring MD: Shelda Pal*    ASSESSMENT:    1. Chest pain in adult   2. Chest pain, unspecified type   3. SOB (shortness of breath)   4. Extrinsic asthma with acute exacerbation, unspecified asthma severity, unspecified whether persistent   5. Essential hypertension    PLAN:    In order of problems listed above:  1. Differential diagnosis includes chronic costochondritis most likely, pulmonary thromboembolism primary lung pathology and/or pleuritis pericarditis but she has no rub or EKG changes unlikely and aortopathy also unlikely.  Will resume treatment with nonsteroidal anti-inflammatory drug that helped in the past for costochondral pain perform CTA of the chest for evaluation and I encouraged her to be seen by her primary care physician for this near continuous cough and shortness of breath despite bronchodilators.  I reviewed the emergency room records independently reviewed the EKG chest x-ray and labs before the visit. 2. CTA ordered for further evaluation particular to exclude venous thromboembolism 3. Appears worsened I advised her to follow-up with her PCP she has been on improved with bronchodilators 4. Stable blood pressure at target   Next appointment: 6 weeks provided CTA is normal   Medication Adjustments/Labs and Tests Ordered: Current medicines are reviewed at length with the patient today.  Concerns regarding medicines are outlined above.  Orders Placed This Encounter  Procedures  . CT ANGIO CHEST PE W OR WO CONTRAST  . Basic metabolic panel  . EKG 12-Lead   Meds ordered this encounter  Medications  . meloxicam (MOBIC) 15 MG tablet    Sig: Take 1 tablet (15 mg total) by mouth daily.    Dispense:  30 tablet    Refill:  3    Chief Complaint  Patient presents with  .  Follow-up    ED  . Chest Pain  . Shortness of Breath    History of Present Illness:    Jenna Campbell is a 67 y.o. female with a hx of costochondral chest pain asthma hypertension and a normal myocardial perfusion image 03/15/2017 last seen 09/03/17.  Is in a [facility] ED 11/28/2017 with chest pain to normal troponins felt to be nonischemic in nature stable EKG and discharge from the emergency room.  EKG showed faint sinus bradycardia nonspecific T waves. Compliance with diet, lifestyle and medications: Yes  She is having chest pain that she describes as constant in the left precordium waxes and wanes she is taken nitroglycerin was given minor degree of relief and is worse with a deep breath of a little bit pleuritic in nature.  She has a nearly constant cough she has asthma she is short of breath with activities but is not wheezing.  She has no identifiable risk factors for venous thromboembolism but complains of pain in the left calf area and relates a history of previous trauma.  Her chest pain is not anginal in nature she has chest wall tenderness along the costochondral junction but does not reproduce her symptoms.  She has had a previous normal myocardial perfusion study and I will think referral to coronary angiography is appropriate I have asked her to restart her nonsteroidal anti-inflammatory drug which gave her relief in the past and undergo chest CT coronary angiography in order to exclude pulmonary embolism identified underlying lung disease  pleural pericardial abnormality and exclude aortic pathology.  I suspect its normal.  I do not know the mechanism of her chronic cough and exertional shortness of breath and I encouraged her to follow-up with her primary care physician. Past Medical History:  Diagnosis Date  . Adjustment disorder with anxious mood 04/21/2015  . Allergic rhinitis due to pollen 07/23/2014  . Alopecia 10/30/2013  . Arthritis 04/24/2016  . Atrophic vaginitis 10/30/2013  .  Basal cell carcinoma 04/21/2015  . Cancer ([place])    skin cancer  . Cervical radiculopathy 09/17/2015  . Change in bowel function 06/14/2016  . Chronic diarrhea 12/16/2015  . Cystocele, midline 10/30/2013  . Dermatitis 09/22/2014  . Diverticulosis of large intestine 10/30/2013  . Dyspareunia 07/23/2014  . Essential hypertension   . Family history of pheochromocytoma 02/14/2016  . GERD (gastroesophageal reflux disease)   . Herpes simplex 08/17/2016  . History of adenomatous polyp of colon 06/14/2016  . History of blood transfusion 1970  . Hypothyroidism 10/30/2013  . Hypotonic bladder 10/30/2013  . Lumbar paraspinal muscle spasm 04/21/2015  . Major depression, recurrent ([place]) 07/14/2013  . Malaise and fatigue 04/21/2015  . MCI (mild cognitive impairment) 08/18/2015  . Menopause 03/31/2014  . Migraine   . Mild persistent asthma 05/13/2015  . Neuropathy 09/22/2014  . Personality disorder ([place]) 10/19/2012  . Pharyngoesophageal dysphagia 04/21/2015  . Plantar fasciitis 01/20/2016  . Precordial chest pain 08/18/2015  . Prolapse of vaginal vault after hysterectomy 10/30/2013  . Rectocele 10/30/2013  . Sinusitis, chronic 10/30/2013  . Slow transit constipation 07/23/2014  . Swelling of lower limb 05/17/2016  . Tension-type headache, not intractable 09/17/2015  . Urethral stricture 10/30/2013  . Urinary incontinence without sensory awareness 07/17/2016  . Varicose veins of both lower extremities 04/24/2016  . Venous insufficiency (chronic) (peripheral) 06/06/2016    Past Surgical History:  Procedure Laterality Date  . BREAST CYST ASPIRATION Left    25 years ago   . MOHS SURGERY  10/05/2015  . RECTOCELE REPAIR  2011  . SKIN CANCER EXCISION  02/2015   Squamous cell removed from back  . TONSILLECTOMY    . VAGINAL HYSTERECTOMY    . VAGINAL PROLAPSE REPAIR      Current Medications: Current Meds  Medication Sig  . albuterol (PROAIR HFA) 108 (90 Base) MCG/ACT inhaler Inhale 2 puffs into the lungs every 4 (four) hours  as needed for wheezing or shortness of breath.  Marland Kitchen albuterol (PROVENTIL) (2.5 MG/3ML) 0.083% nebulizer solution Take 3 mLs (2.5 mg total) by nebulization every 6 (six) hours as needed for wheezing or shortness of breath.  . clobetasol cream (TEMOVATE) 1.02 % Apply 1 application topically 2 (two) times daily. For 10 days  . fexofenadine (ALLEGRA) 180 MG tablet Take 180 mg by mouth daily.  . fluticasone (FLONASE) 50 MCG/ACT nasal spray Place 1 spray into both nostrils daily.  Marland Kitchen gabapentin (NEURONTIN) 300 MG capsule Take 300 mg by mouth 2 (two) times daily.  Marland Kitchen HYDROcodone-acetaminophen (NORCO/VICODIN) 5-325 MG tablet Take 1 tablet by mouth every 4 (four) hours as needed.  Marland Kitchen levothyroxine (SYNTHROID, LEVOTHROID) 150 MCG tablet Take 1.5 tablets (225 mcg total) by mouth daily before breakfast.  . LORazepam (ATIVAN) 0.5 MG tablet Take 0.5 mg by mouth every 8 (eight) hours as needed.   . meloxicam (MOBIC) 15 MG tablet Take 1 tablet (15 mg total) by mouth daily.  . nitroGLYCERIN (NITROSTAT) 0.4 MG SL tablet Place 1 tablet (0.4 mg total) under the tongue every 5 (five) minutes  as needed for chest pain.  Marland Kitchen omeprazole (PRILOSEC) 40 MG capsule Take 40 mg by mouth daily.  . temazepam (RESTORIL) 30 MG capsule :take 1 cap at night for sleep (to replace 15mg  size)  . tiZANidine (ZANAFLEX) 2 MG tablet Take 1 tablet (2 mg total) by mouth every 8 (eight) hours as needed for muscle spasms.  Marland Kitchen topiramate (TOPAMAX) 100 MG tablet Take 100 mg by mouth 2 (two) times daily.     Allergies:   Iodinated diagnostic agents; Kenalog [triamcinolone acetonide]; Metrizamide; Other; Montelukast sodium; Penicillins; Symbicort [budesonide-formoterol fumarate]; Ciprofloxacin; Diazepam; and Sulfa antibiotics   Social History   Socioeconomic History  . Marital status: Divorced    Spouse name: Not on file  . Number of children: Not on file  . Years of education: Not on file  . Highest education level: Not on file  Occupational  History  . Not on file  Social Needs  . Financial resource strain: Not on file  . Food insecurity:    Worry: Not on file    Inability: Not on file  . Transportation needs:    Medical: Not on file    Non-medical: Not on file  Tobacco Use  . Smoking status: Never Smoker  . Smokeless tobacco: Never Used  Substance and Sexual Activity  . Alcohol use: Yes  . Drug use: No  . Sexual activity: Never    Partners: Male  Lifestyle  . Physical activity:    Days per week: Not on file    Minutes per session: Not on file  . Stress: Not on file  Relationships  . Social connections:    Talks on phone: Not on file    Gets together: Not on file    Attends religious service: Not on file    Active member of club or organization: Not on file    Attends meetings of clubs or organizations: Not on file    Relationship status: Not on file  Other Topics Concern  . Not on file  Social History Narrative  . Not on file     Family History: The patient'sfamily history includes Alzheimer's disease in her mother; Angina in her father; Asthma in her father; Cancer in her sister; Emphysema in her father. ROS:   Please see the history of present illness.    All other systems reviewed and are negative.  EKGs/Labs/Other Studies Reviewed:    The following studies were reviewed today:   EKG today shows sinus rhythm nonspecific ST-T Recent Labs: 04/12/2017: TSH 3.11 11/28/2017: ALT 16; BUN 11; Creatinine, Ser 0.62; Hemoglobin 14.0; Platelets 288; Potassium 3.9; Sodium 135  Recent Lipid Panel No results found for: CHOL, TRIG, HDL, CHOLHDL, VLDL, LDLCALC, LDLDIRECT  Physical Exam:    VS:  BP 120/78 (BP Location: Left Arm, Patient Position: Sitting, Cuff Size: Normal)   Pulse 64   Ht 5\' 6"  (1.676 m)   Wt 144 lb (65.3 kg)   SpO2 98%   BMI 23.24 kg/m     Wt Readings from Last 3 Encounters:  12/05/17 144 lb (65.3 kg)  11/28/17 144 lb (65.3 kg)  11/12/17 144 lb 4 oz (65.4 kg)     GEN: Anxious  coughing continuously Well nourished, well developed in no acute distress HEENT: Normal NECK: No JVD; No carotid bruits LYMPHATICS: No lymphadenopathy CARDIAC: RRR, no murmurs, rubs, gallops RESPIRATORY:  Clear to auscultation without rales, wheezing or rhonchi  ABDOMEN: Soft, non-tender, non-distended MUSCULOSKELETAL:  No edema; No deformity she has no palpable  cord or edema but is tender in the left calf SKIN: Warm and dry NEUROLOGIC:  Alert and oriented x 3 PSYCHIATRIC:  Normal affect    Signed, Shirlee More, MD  12/05/2017 12:00 PM    [place]

## 2017-12-05 ENCOUNTER — Encounter: Payer: Self-pay | Admitting: Cardiology

## 2017-12-05 ENCOUNTER — Ambulatory Visit (INDEPENDENT_AMBULATORY_CARE_PROVIDER_SITE_OTHER): Payer: PPO | Admitting: Cardiology

## 2017-12-05 VITALS — BP 120/78 | HR 64 | Ht 66.0 in | Wt 144.0 lb

## 2017-12-05 DIAGNOSIS — R0602 Shortness of breath: Secondary | ICD-10-CM | POA: Diagnosis not present

## 2017-12-05 DIAGNOSIS — R079 Chest pain, unspecified: Secondary | ICD-10-CM

## 2017-12-05 DIAGNOSIS — I10 Essential (primary) hypertension: Secondary | ICD-10-CM | POA: Diagnosis not present

## 2017-12-05 DIAGNOSIS — J45901 Unspecified asthma with (acute) exacerbation: Secondary | ICD-10-CM | POA: Diagnosis not present

## 2017-12-05 MED ORDER — MELOXICAM 15 MG PO TABS: 15.0000 mg | ORAL_TABLET | Freq: Every day | ORAL | 3 refills | Status: DC

## 2017-12-05 NOTE — Patient Instructions (Addendum)
Medication Instructions:  Your physician has recommended you make the following change in your medication:   START: Mobic 15 mg tablet daily.   Labwork: Your physician recommends that you return for lab work today: BMP  Testing/Procedures: You had an ekg.   Non-Cardiac CT Angiography (CTA), is a special type of CT scan that uses a computer to produce multi-dimensional views of major blood vessels throughout the body. In CT angiography, a contrast material is injected through an IV to help visualize the blood vessels   Follow-Up: Your physician recommends that you schedule a follow-up appointment in: 6 weeks.   Any Other Special Instructions Will Be Listed Below (If Applicable).     If you need a refill on your cardiac medications before your next appointment, please call your pharmacy.   Costochondritis Costochondritis is swelling and irritation (inflammation) of the tissue (cartilage) that connects your ribs to your breastbone (sternum). This causes pain in the front of your chest. Usually, the pain:  Starts gradually.  Is in more than one rib.  This condition usually goes away on its own over time. Follow these instructions at home:  Do not do anything that makes your pain worse.  If directed, put ice on the painful area: ? Put ice in a plastic bag. ? Place a towel between your skin and the bag. ? Leave the ice on for 20 minutes, 2-3 times a day.  If directed, put heat on the affected area as often as told by your doctor. Use the heat source that your doctor tells you to use, such as a moist heat pack or a heating pad. ? Place a towel between your skin and the heat source. ? Leave the heat on for 20-30 minutes. ? Take off the heat if your skin turns bright red. This is very important if you cannot feel pain, heat, or cold. You may have a greater risk of getting burned.  Take over-the-counter and prescription medicines only as told by your doctor.  Return to your  normal activities as told by your doctor. Ask your doctor what activities are safe for you.  Keep all follow-up visits as told by your doctor. This is important. Contact a doctor if:  You have chills or a fever.  Your pain does not go away or it gets worse.  You have a cough that does not go away. Get help right away if:  You are short of breath. This information is not intended to replace advice given to you by your health care provider. Make sure you discuss any questions you have with your health care provider. Document Released: 10/18/2007 Document Revised: 11/19/2015 Document Reviewed: 08/25/2015 Elsevier Interactive Patient Education  2018 [employer].   Cardiac CT Angiogram A cardiac CT angiogram is a procedure to look at the heart and the area around the heart. It may be done to help find the cause of chest pains or other symptoms of heart disease. During this procedure, a large X-ray machine, called a CT scanner, takes detailed pictures of the heart and the surrounding area after a dye (contrast material) has been injected into blood vessels in the area. The procedure is also sometimes called a coronary CT angiogram, coronary artery scanning, or CTA. A cardiac CT angiogram allows the health care provider to see how well blood is flowing to and from the heart. The health care provider will be able to see if there are any problems, such as:  Blockage or narrowing of the  coronary arteries in the heart.  Fluid around the heart.  Signs of weakness or disease in the muscles, valves, and tissues of the heart.  Tell a health care provider about:  Any allergies you have. This is especially important if you have had a previous allergic reaction to contrast dye.  All medicines you are taking, including vitamins, herbs, eye drops, creams, and over-the-counter medicines.  Any blood disorders you have.  Any surgeries you have had.  Any medical conditions you have.  Whether you  are pregnant or may be pregnant.  Any anxiety disorders, chronic pain, or other conditions you have that may increase your stress or prevent you from lying still. What are the risks? Generally, this is a safe procedure. However, problems may occur, including:  Bleeding.  Infection.  Allergic reactions to medicines or dyes.  Damage to other structures or organs.  Kidney damage from the dye or contrast that is used.  Increased risk of cancer from radiation exposure. This risk is low. Talk with your health care provider about: ? The risks and benefits of testing. ? How you can receive the lowest dose of radiation.  What happens before the procedure?  Wear comfortable clothing and remove any jewelry, glasses, dentures, and hearing aids.  Follow instructions from your health care provider about eating and drinking. This may include: ? For 12 hours before the test - avoid caffeine. This includes tea, coffee, soda, energy drinks, and diet pills. Drink plenty of water or other fluids that do not have caffeine in them. Being well-hydrated can prevent complications. ? For 4-6 hours before the test - stop eating and drinking. The contrast dye can cause nausea, but this is less likely if your stomach is empty.  Ask your health care provider about changing or stopping your regular medicines. This is especially important if you are taking diabetes medicines, blood thinners, or medicines to treat erectile dysfunction. What happens during the procedure?  Hair on your chest may need to be removed so that small sticky patches called electrodes can be placed on your chest. These will transmit information that helps to monitor your heart during the test.  An IV tube will be inserted into one of your veins.  You might be given a medicine to control your heart rate during the test. This will help to ensure that good images are obtained.  You will be asked to lie on an exam table. This table will slide  in and out of the CT machine during the procedure.  Contrast dye will be injected into the IV tube. You might feel warm, or you may get a metallic taste in your mouth.  You will be given a medicine (nitroglycerin) to relax (dilate) the arteries in your heart.  The table that you are lying on will move into the CT machine tunnel for the scan.  The person running the machine will give you instructions while the scans are being done. You may be asked to: ? Keep your arms above your head. ? Hold your breath. ? Stay very still, even if the table is moving.  When the scanning is complete, you will be moved out of the machine.  The IV tube will be removed. The procedure may vary among health care providers and hospitals. What happens after the procedure?  You might feel warm, or you may get a metallic taste in your mouth from the contrast dye.  You may have a headache from the nitroglycerin.  After the  procedure, drink water or other fluids to wash (flush) the contrast material out of your body.  Contact a health care provider if you have any symptoms of allergy to the contrast. These symptoms include: ? Shortness of breath. ? Rash or hives. ? A racing heartbeat.  Most people can return to their normal activities right after the procedure. Ask your health care provider what activities are safe for you.  It is up to you to get the results of your procedure. Ask your health care provider, or the department that is doing the procedure, when your results will be ready. Summary  A cardiac CT angiogram is a procedure to look at the heart and the area around the heart. It may be done to help find the cause of chest pains or other symptoms of heart disease.  During this procedure, a large X-ray machine, called a CT scanner, takes detailed pictures of the heart and the surrounding area after a dye (contrast material) has been injected into blood vessels in the area.  Ask your health care  provider about changing or stopping your regular medicines before the procedure. This is especially important if you are taking diabetes medicines, blood thinners, or medicines to treat erectile dysfunction.  After the procedure, drink water or other fluids to wash (flush) the contrast material out of your body. This information is not intended to replace advice given to you by your health care provider. Make sure you discuss any questions you have with your health care provider. Document Released: 04/13/2008 Document Revised: 03/20/2016 Document Reviewed: 03/20/2016 Elsevier Interactive Patient Education  2017 [place].  Meloxicam tablets What is this medicine? MELOXICAM (mel OX i cam) is a non-steroidal anti-inflammatory drug (NSAID). It is used to reduce swelling and to treat pain. It may be used for osteoarthritis, rheumatoid arthritis, or juvenile rheumatoid arthritis. This medicine may be used for other purposes; ask your health care provider or pharmacist if you have questions. COMMON BRAND NAME(S): Mobic What should I tell my health care provider before I take this medicine? They need to know if you have any of these conditions: -bleeding disorders -cigarette smoker -coronary artery bypass graft (CABG) surgery within the past 2 weeks -drink more than 3 alcohol-containing drinks per day -heart disease -high blood pressure -history of stomach bleeding -kidney disease -liver disease -lung or breathing disease, like asthma -stomach or intestine problems -an unusual or allergic reaction to meloxicam, aspirin, other NSAIDs, other medicines, foods, dyes, or preservatives -pregnant or trying to get pregnant -breast-feeding How should I use this medicine? Take this medicine by mouth with a full glass of water. Follow the directions on the prescription label. You can take it with or without food. If it upsets your stomach, take it with food. Take your medicine at regular intervals. Do  not take it more often than directed. Do not stop taking except on your doctor's advice. A special MedGuide will be given to you by the pharmacist with each prescription and refill. Be sure to read this information carefully each time. Talk to your pediatrician regarding the use of this medicine in children. While this drug may be prescribed for selected conditions, precautions do apply. Patients over 110 years old may have a stronger reaction and need a smaller dose. Overdosage: If you think you have taken too much of this medicine contact a poison control center or emergency room at once. NOTE: This medicine is only for you. Do not share this medicine with others. What if  I miss a dose? If you miss a dose, take it as soon as you can. If it is almost time for your next dose, take only that dose. Do not take double or extra doses. What may interact with this medicine? Do not take this medicine with any of the following medications: -cidofovir -ketorolac This medicine may also interact with the following medications: -aspirin and aspirin-like medicines -certain medicines for blood pressure, heart disease, irregular heart beat -certain medicines for depression, anxiety, or psychotic disturbances -certain medicines that treat or prevent blood clots like warfarin, enoxaparin, dalteparin, apixaban, dabigatran, rivaroxaban -cyclosporine -digoxin -diuretics -methotrexate -other NSAIDs, medicines for pain and inflammation, like ibuprofen and naproxen -pemetrexed This list may not describe all possible interactions. Give your health care provider a list of all the medicines, herbs, non-prescription drugs, or dietary supplements you use. Also tell them if you smoke, drink alcohol, or use illegal drugs. Some items may interact with your medicine. What should I watch for while using this medicine? Tell your doctor or healthcare professional if your symptoms do not start to get better or if they get  worse. Do not take other medicines that contain aspirin, ibuprofen, or naproxen with this medicine. Side effects such as stomach upset, nausea, or ulcers may be more likely to occur. Many medicines available without a prescription should not be taken with this medicine. This medicine can cause ulcers and bleeding in the stomach and intestines at any time during treatment. This can happen with no warning and may cause death. There is increased risk with taking this medicine for a long time. Smoking, drinking alcohol, older age, and poor health can also increase risks. Call your doctor right away if you have stomach pain or blood in your vomit or stool. This medicine does not prevent heart attack or stroke. In fact, this medicine may increase the chance of a heart attack or stroke. The chance may increase with longer use of this medicine and in people who have heart disease. If you take aspirin to prevent heart attack or stroke, talk with your doctor or health care professional. What side effects may I notice from receiving this medicine? Side effects that you should report to your doctor or health care professional as soon as possible: -allergic reactions like skin rash, itching or hives, swelling of the face, lips, or tongue -nausea, vomiting -signs and symptoms of a blood clot such as breathing problems; changes in vision; chest pain; severe, sudden headache; pain, swelling, warmth in the leg; trouble speaking; sudden numbness or weakness of the face, arm, or leg -signs and symptoms of bleeding such as bloody or black, tarry stools; red or dark-brown urine; spitting up blood or brown material that looks like coffee grounds; red spots on the skin; unusual bruising or bleeding from the eye, gums, or nose -signs and symptoms of liver injury like dark yellow or brown urine; general ill feeling or flu-like symptoms; light-colored stools; loss of appetite; nausea; right upper belly pain; unusually weak or  tired; yellowing of the eyes or skin -signs and symptoms of stroke like changes in vision; confusion; trouble speaking or understanding; severe headaches; sudden numbness or weakness of the face, arm, or leg; trouble walking; dizziness; loss of balance or coordination Side effects that usually do not require medical attention (report to your doctor or health care professional if they continue or are bothersome): -constipation -diarrhea -gas This list may not describe all possible side effects. Call your doctor for medical advice about side  effects. You may report side effects to FDA at 1-800-FDA-1088. Where should I keep my medicine? Keep out of the reach of children. Store at room temperature between 15 and 30 degrees C (59 and 86 degrees F). Throw away any unused medicine after the expiration date. NOTE: This sheet is a summary. It may not cover all possible information. If you have questions about this medicine, talk to your doctor, pharmacist, or health care provider.  2018 Elsevier/Gold Standard (2015-06-02 19:28:16)

## 2017-12-10 ENCOUNTER — Ambulatory Visit (HOSPITAL_COMMUNITY)
Admission: RE | Admit: 2017-12-10 | Discharge: 2017-12-10 | Disposition: A | Discharge status: Home / Self Care | Payer: PPO | Source: Ambulatory Visit | Attending: Cardiology | Admitting: Cardiology

## 2017-12-10 ENCOUNTER — Telehealth: Payer: Self-pay

## 2017-12-10 DIAGNOSIS — R0602 Shortness of breath: Secondary | ICD-10-CM | POA: Diagnosis not present

## 2017-12-10 DIAGNOSIS — R079 Chest pain, unspecified: Secondary | ICD-10-CM | POA: Insufficient documentation

## 2017-12-10 DIAGNOSIS — R05 Cough: Secondary | ICD-10-CM | POA: Diagnosis not present

## 2017-12-10 MED ORDER — TECHNETIUM TO 99M ALBUMIN AGGREGATED
4.0000 | Freq: Once | INTRAVENOUS | Status: AC | PRN
  Administered 2017-12-10: 4 via INTRAVENOUS

## 2017-12-10 MED ORDER — TECHNETIUM TC 99M DIETHYLENETRIAME-PENTAACETIC ACID
30.0000 | Freq: Once | INTRAVENOUS | Status: AC | PRN
  Administered 2017-12-10: 30 via RESPIRATORY_TRACT

## 2017-12-10 NOTE — Telephone Encounter (Signed)
Please see referral note from 7/1- note stated [facility] reached out but she stated she already had an appt in [place]?

## 2017-12-10 NOTE — Telephone Encounter (Signed)
FYI

## 2017-12-10 NOTE — Telephone Encounter (Signed)
Can discuss at appt. On Thursday 12/13/2017

## 2017-12-10 NOTE — Telephone Encounter (Signed)
Copied from [place] [id number]. Topic: Inquiry >> Dec 10, 2017  2:10 PM Vernona Rieger wrote: Reason for CRM: Patient said she has not gotten to see the ENT yet. She had pulmonary test done today and they all came back fine. Patient will be in to see Dr Nani Ravens on 12/13/17.

## 2017-12-13 ENCOUNTER — Ambulatory Visit (INDEPENDENT_AMBULATORY_CARE_PROVIDER_SITE_OTHER): Payer: PPO | Admitting: Family Medicine

## 2017-12-13 ENCOUNTER — Encounter: Payer: Self-pay | Admitting: Family Medicine

## 2017-12-13 VITALS — BP 110/72 | HR 61 | Temp 98.0°F | Ht 66.0 in | Wt 144.0 lb

## 2017-12-13 DIAGNOSIS — M940 Chondrocostal junction syndrome [Tietze]: Secondary | ICD-10-CM

## 2017-12-13 DIAGNOSIS — R0602 Shortness of breath: Secondary | ICD-10-CM

## 2017-12-13 DIAGNOSIS — G44209 Tension-type headache, unspecified, not intractable: Secondary | ICD-10-CM | POA: Diagnosis not present

## 2017-12-13 MED ORDER — KETOROLAC TROMETHAMINE 30 MG/ML IJ SOLN
30.0000 mg | Freq: Once | INTRAMUSCULAR | Status: AC
  Administered 2017-12-13: 30 mg via INTRAMUSCULAR

## 2017-12-13 MED ORDER — FLUTICASONE PROPIONATE HFA 110 MCG/ACT IN AERO: 2.0000 | INHALATION_SPRAY | Freq: Two times a day (BID) | RESPIRATORY_TRACT | 12 refills | Status: DC

## 2017-12-13 NOTE — Progress Notes (Signed)
Pre visit review using our clinic review tool, if applicable. No additional management support is needed unless otherwise documented below in the visit note. 

## 2017-12-13 NOTE — Progress Notes (Signed)
Chief Complaint  Patient presents with  . Follow-up    Subjective: Patient is a 67 y.o. female here for f/u.  Patient has a history of cough/shortness of breath.  She states that it feels like she has to breathe through a straw at times.  Per her cardiologist note, she has responded well to inhalers.  She reports not improving with albuterol.  She is a non-smoker.  She has a history of atypical chest wall pain.  It is in the left side of her chest.  She recently had a stress test with the cardiology team done that was unremarkable. She wonders if she has median arcuate ligament syndrome.  She has had a headache for the past several days.  She does have a history of migraines.  No numbness, tingling, weakness, or balance issues.   ROS: Heart: +chest pain  Lungs: +cough   Past Medical History:  Diagnosis Date  . Adjustment disorder with anxious mood 04/21/2015  . Allergic rhinitis due to pollen 07/23/2014  . Alopecia 10/30/2013  . Arthritis 04/24/2016  . Atrophic vaginitis 10/30/2013  . Basal cell carcinoma 04/21/2015  . Cancer ([place])    skin cancer  . Cervical radiculopathy 09/17/2015  . Change in bowel function 06/14/2016  . Chronic diarrhea 12/16/2015  . Cystocele, midline 10/30/2013  . Dermatitis 09/22/2014  . Diverticulosis of large intestine 10/30/2013  . Dyspareunia 07/23/2014  . Essential hypertension   . Family history of pheochromocytoma 02/14/2016  . GERD (gastroesophageal reflux disease)   . Herpes simplex 08/17/2016  . History of adenomatous polyp of colon 06/14/2016  . History of blood transfusion 1970  . Hypothyroidism 10/30/2013  . Hypotonic bladder 10/30/2013  . Lumbar paraspinal muscle spasm 04/21/2015  . Major depression, recurrent ([place]) 07/14/2013  . Malaise and fatigue 04/21/2015  . MCI (mild cognitive impairment) 08/18/2015  . Menopause 03/31/2014  . Migraine   . Mild persistent asthma 05/13/2015  . Neuropathy 09/22/2014  . Personality disorder ([place]) 10/19/2012  .  Pharyngoesophageal dysphagia 04/21/2015  . Plantar fasciitis 01/20/2016  . Precordial chest pain 08/18/2015  . Prolapse of vaginal vault after hysterectomy 10/30/2013  . Rectocele 10/30/2013  . Sinusitis, chronic 10/30/2013  . Slow transit constipation 07/23/2014  . Swelling of lower limb 05/17/2016  . Tension-type headache, not intractable 09/17/2015  . Urethral stricture 10/30/2013  . Urinary incontinence without sensory awareness 07/17/2016  . Varicose veins of both lower extremities 04/24/2016  . Venous insufficiency (chronic) (peripheral) 06/06/2016    Objective: BP 110/72 (BP Location: Left Arm, Patient Position: Sitting, Cuff Size: Normal)   Pulse 61   Temp 98 F (36.7 C) (Oral)   Ht 5\' 6"  (1.676 m)   Wt 144 lb (65.3 kg)   SpO2 97%   BMI 23.24 kg/m  General: Awake, appears stated age HEENT: MMM, EOMi Heart: RRR, no LE edema MSK: +TTP over L chest wall, 5/5 strength throughout Lungs: CTAB, no rales, wheezes or rhonchi. No accessory muscle use Neuro: DTR's equal and symmetric  Psych: Age appropriate judgment and insight  Assessment and Plan: Costochondritis  Acute non intractable tension-type headache - Plan: ketorolac (TORADOL) 30 MG/ML injection 30 mg  Shortness of breath - Plan: fluticasone (FLOVENT HFA) 110 MCG/ACT inhaler  Orders as above. Stretches/exercises for [employer]. Activity as tolerated. I dont think her issue is related to Med arcuate lig syndrome. Will refer to sports medicine if this does not improve.  Trial inhaler. F/u prn.  The patient voiced understanding and agreement to the plan.  [place], DO 12/13/17  2:22 PM

## 2017-12-13 NOTE — Patient Instructions (Addendum)
Pectoralis Major Rehab It is normal to feel mild stretching, pulling, tightness, or discomfort as you do these exercises, but you should stop right away if you feel sudden pain or your pain gets worse.Do not begin these exercises until told by your health care provider. Stretching and range of motion exercises These exercises warm up your muscles and joints and improve the movement and flexibility of your shoulder. These exercises can also help to relieve pain, numbness, and tingling. Exercise A: Pendulum  1. Stand near a wall or a surface that you can hold onto for balance. 2. Bend at the waist and let your left / right arm hang straight down. Use your other arm to keep your balance. 3. Relax your arm and shoulder muscles, and move your hips and your trunk so your left / right arm swings freely. Your arm should swing because of the motion of your body, not because you are using your arm or shoulder muscles. 4. Keep moving so your arm swings in the following directions, as told by your health care provider: ? Side to side. ? Forward and backward. ? In clockwise and counterclockwise circles. 5. Slowly return to the starting position. Repeat 2 times. Complete this exercise 3 times per week. Exercise B: Abduction, standing 1. Stand and hold a broomstick, a cane, or a similar object. Place your hands a little more than shoulder-width apart on the object. Your left / right hand should be palm-up, and your other hand should be palm-down. 2. While keeping your elbow straight and your shoulder muscles relaxed, push the stick across your body toward your left / right side. Raise your left / right arm to the side of your body and then over your head until you feel a stretch in your shoulder. ? Stop when you reach the angle that is recommended by your health care provider. ? Avoid shrugging your shoulder while you raise your arm. Keep your shoulder blade tucked down toward the middle of your spine. 3. Hold  for 10 seconds. 4. Slowly return to the starting position. Repeat 2 times. Complete this exercise 3 times per week. Exercise C: Wand flexion, supine  1. Lie on your back. You may bend your knees for comfort. 2. Hold a broomstick, a cane, or a similar object so that your hands are about shoulder-width apart on the object. Your palms should face toward your feet. 3. Raise your left / right arm in front of your face, then behind your head (toward the floor). Use your other hand to help you do this. Stop when you feel a gentle stretch in your shoulder, or when you reach the angle that is recommended by your health care provider. 4. Hold for 3 seconds. 5. Use the broomstick and your other arm to help you return your left / right arm to the starting position. Repeat 2 times. Complete this exercise 3 times per week. Exercise D: Wand shoulder external rotation 1. Stand and hold a broomstick, a cane, or a similar object so your handsare about shoulder-width apart on the object. 2. Start with your arms hanging down, then bend both elbows to an "L" shape (90 degrees). 3. Keep your left / right elbow at your side. Use your other hand to push the stick so your left / right forearm moves away from your body, out to your side. ? Keep your left / right elbow bent to 90 degrees and keep it against your side. ? Stop when you feel a gentle  stretch in your shoulder, or when you reach the angle recommended by your health care provider. 4. Hold for 10 seconds. 5. Use the stick to help you return your left / right arm to the starting position. Repeat 2 times. Complete this exercise 3 times per week. Strengthening exercises These exercises build strength and endurance in your shoulder. Endurance is the ability to use your muscles for a long time, even after your muscles get tired. Exercise E: Scapular protraction, standing 1. Stand so you are facing a wall. Place your feet about one arm-length away from the  wall. 2. Place your hands on the wall and straighten your elbows. 3. Keep your hands on the wall as you push your upper back away from the wall. You should feel your shoulder blades sliding forward.Keep your elbows and your head still. ? If you are not sure that you are doing this exercise correctly, ask your health care provider for more instructions. 4. Hold for 3 seconds. 5. Slowly return to the starting position. Let your muscles relax completely before you repeat this exercise. Repeat 2 times. Complete this exercise 3 times per week. Exercise F: Shoulder blade squeezes  (scapular retraction) 1. Sit with good posture in a stable chair. Do not let your back touch the back of the chair. 2. Your arms should be at your sides with your elbows bent. You may rest your forearms on a pillow if that is more comfortable. 3. Squeeze your shoulder blades together. Bring them down and back. ? Keep your shoulders level. ? Do not lift your shoulders up toward your ears. 4. Hold for 3 seconds. 5. Return to the starting position. Repeat 2 times. Complete this exercise 3 times per week. Make sure you discuss any questions you have with your health care provider. Document Released: 05/01/2005 Document Revised: 02/10/2016 Document Reviewed: 01/17/2015 Elsevier Interactive Patient Education  2018 [place] (ROM) AND STRETCHING EXERCISES  These exercises may help you when beginning to rehabilitate your issue. In order to successfully resolve your symptoms, you must improve your posture. These exercises are designed to help reduce the forward-head and rounded-shoulder posture which contributes to this condition. Your symptoms may resolve with or without further involvement from your physician, physical therapist or athletic trainer. While completing these exercises, remember:   Restoring tissue flexibility helps normal motion to return to the joints. This allows healthier, less  painful movement and activity.  An effective stretch should be held for at least 20 seconds, although you may need to begin with shorter hold times for comfort.  A stretch should never be painful. You should only feel a gentle lengthening or release in the stretched tissue.  Do not do any stretch or exercise that you cannot tolerate.  STRETCH- Axial Extensors  Lie on your back on the floor. You may bend your knees for comfort. Place a rolled-up hand towel or dish towel, about 2 inches in diameter, under the part of your head that makes contact with the floor.  Gently tuck your chin, as if trying to make a "double chin," until you feel a gentle stretch at the base of your head.  Hold 15-20 seconds. Repeat 2-3 times. Complete this exercise 1 time per day.   STRETCH - Axial Extension   Stand or sit on a firm surface. Assume a good posture: chest up, shoulders drawn back, abdominal muscles slightly tense, knees unlocked (if standing) and feet hip width apart.  Slowly retract your  chin so your head slides back and your chin slightly lowers. Continue to look straight ahead.  You should feel a gentle stretch in the back of your head. Be certain not to feel an aggressive stretch since this can cause headaches later.  Hold for 15-20 seconds. Repeat 2-3 times. Complete this exercise 1 time per day.  STRETCH - Cervical Side Bend   Stand or sit on a firm surface. Assume a good posture: chest up, shoulders drawn back, abdominal muscles slightly tense, knees unlocked (if standing) and feet hip width apart.  Without letting your nose or shoulders move, slowly tip your right / left ear to your shoulder until your feel a gentle stretch in the muscles on the opposite side of your neck.  Hold 15-20 seconds. Repeat 2-3 times. Complete this exercise 1-2 times per day.  STRETCH - Cervical Rotators   Stand or sit on a firm surface. Assume a good posture: chest up, shoulders drawn back, abdominal  muscles slightly tense, knees unlocked (if standing) and feet hip width apart.  Keeping your eyes level with the ground, slowly turn your head until you feel a gentle stretch along the back and opposite side of your neck.  Hold 15-20 seconds. Repeat 2-3 times. Complete this exercise 1-2 times per day.  RANGE OF MOTION - Neck Circles   Stand or sit on a firm surface. Assume a good posture: chest up, shoulders drawn back, abdominal muscles slightly tense, knees unlocked (if standing) and feet hip width apart.  Gently roll your head down and around from the back of one shoulder to the back of the other. The motion should never be forced or painful.  Repeat the motion 10-20 times, or until you feel the neck muscles relax and loosen. Repeat 2-3 times. Complete the exercise 1-2 times per day. STRENGTHENING EXERCISES - Cervical Strain and Sprain These exercises may help you when beginning to rehabilitate your injury. They may resolve your symptoms with or without further involvement from your physician, physical therapist, or athletic trainer. While completing these exercises, remember:   Muscles can gain both the endurance and the strength needed for everyday activities through controlled exercises.  Complete these exercises as instructed by your physician, physical therapist, or athletic trainer. Progress the resistance and repetitions only as guided.  You may experience muscle soreness or fatigue, but the pain or discomfort you are trying to eliminate should never worsen during these exercises. If this pain does worsen, stop and make certain you are following the directions exactly. If the pain is still present after adjustments, discontinue the exercise until you can discuss the trouble with your clinician.  STRENGTH - Cervical Flexors, Isometric  Face a wall, standing about 6 inches away. Place a small pillow, a ball about 6-8 inches in diameter, or a folded towel between your forehead and  the wall.  Slightly tuck your chin and gently push your forehead into the soft object. Push only with mild to moderate intensity, building up tension gradually. Keep your jaw and forehead relaxed.  Hold 10 to 20 seconds. Keep your breathing relaxed.  Release the tension slowly. Relax your neck muscles completely before you start the next repetition. Repeat 2-3 times. Complete this exercise 1 time per day.  STRENGTH- Cervical Lateral Flexors, Isometric   Stand about 6 inches away from a wall. Place a small pillow, a ball about 6-8 inches in diameter, or a folded towel between the side of your head and the wall.  Slightly tuck  your chin and gently tilt your head into the soft object. Push only with mild to moderate intensity, building up tension gradually. Keep your jaw and forehead relaxed.  Hold 10 to 20 seconds. Keep your breathing relaxed.  Release the tension slowly. Relax your neck muscles completely before you start the next repetition. Repeat 2-3 times. Complete this exercise 1 time per day.  STRENGTH - Cervical Extensors, Isometric   Stand about 6 inches away from a wall. Place a small pillow, a ball about 6-8 inches in diameter, or a folded towel between the back of your head and the wall.  Slightly tuck your chin and gently tilt your head back into the soft object. Push only with mild to moderate intensity, building up tension gradually. Keep your jaw and forehead relaxed.  Hold 10 to 20 seconds. Keep your breathing relaxed.  Release the tension slowly. Relax your neck muscles completely before you start the next repetition. Repeat 2-3 times. Complete this exercise 1 time per day.  POSTURE AND BODY MECHANICS CONSIDERATIONS Keeping correct posture when sitting, standing or completing your activities will reduce the stress put on different body tissues, allowing injured tissues a chance to heal and limiting painful experiences. The following are general guidelines for improved  posture. Your physician or physical therapist will provide you with any instructions specific to your needs. While reading these guidelines, remember:  The exercises prescribed by your provider will help you have the flexibility and strength to maintain correct postures.  The correct posture provides the optimal environment for your joints to work. All of your joints have less wear and tear when properly supported by a spine with good posture. This means you will experience a healthier, less painful body.  Correct posture must be practiced with all of your activities, especially prolonged sitting and standing. Correct posture is as important when doing repetitive low-stress activities (typing) as it is when doing a single heavy-load activity (lifting).  PROLONGED STANDING WHILE SLIGHTLY LEANING FORWARD When completing a task that requires you to lean forward while standing in one place for a long time, place either foot up on a stationary 2- to 4-inch high object to help maintain the best posture. When both feet are on the ground, the low back tends to lose its slight inward curve. If this curve flattens (or becomes too large), then the back and your other joints will experience too much stress, fatigue more quickly, and can cause pain.   RESTING POSITIONS Consider which positions are most painful for you when choosing a resting position. If you have pain with flexion-based activities (sitting, bending, stooping, squatting), choose a position that allows you to rest in a less flexed posture. You would want to avoid curling into a fetal position on your side. If your pain worsens with extension-based activities (prolonged standing, working overhead), avoid resting in an extended position such as sleeping on your stomach. Most people will find more comfort when they rest with their spine in a more neutral position, neither too rounded nor too arched. Lying on a non-sagging bed on your side with a pillow  between your knees, or on your back with a pillow under your knees will often provide some relief. Keep in mind, being in any one position for a prolonged period of time, no matter how correct your posture, can still lead to stiffness.  WALKING Walk with an upright posture. Your ears, shoulders, and hips should all line up. OFFICE WORK When working at a desk,  create an environment that supports good, upright posture. Without extra support, muscles fatigue and lead to excessive strain on joints and other tissues.  CHAIR:  A chair should be able to slide under your desk when your back makes contact with the back of the chair. This allows you to work closely.  The chair's height should allow your eyes to be level with the upper part of your monitor and your hands to be slightly lower than your elbows.  Body position: ? Your feet should make contact with the floor. If this is not possible, use a foot rest. ? Keep your ears over your shoulders. This will reduce stress on your neck and low back.

## 2017-12-18 ENCOUNTER — Other Ambulatory Visit: Payer: Self-pay

## 2017-12-19 ENCOUNTER — Telehealth: Payer: Self-pay | Admitting: Family Medicine

## 2017-12-19 DIAGNOSIS — H9319 Tinnitus, unspecified ear: Secondary | ICD-10-CM

## 2017-12-19 NOTE — Telephone Encounter (Signed)
OK to refer to Dr. Lucia Gaskins. Do we have any payment cards?

## 2017-12-19 NOTE — Telephone Encounter (Signed)
Advise on cheaper prescription and referral

## 2017-12-19 NOTE — Telephone Encounter (Signed)
Copied from [place] [id number]. Topic: Quick Communication - Rx Refill/Question >> Dec 19, 2017  2:59 PM Burchel, Abbi R wrote: Medication:  fluticasone (FLOVENT HFA) 110 MCG/ACT inhaler   Pt states this rx is too expensive, please advise.  Pt is also requesting referral to  Dr Melony Overly, ENT.  ([phone])  Pt: [phone]

## 2017-12-20 DIAGNOSIS — J342 Deviated nasal septum: Secondary | ICD-10-CM | POA: Diagnosis not present

## 2017-12-20 DIAGNOSIS — J310 Chronic rhinitis: Secondary | ICD-10-CM | POA: Diagnosis not present

## 2017-12-20 NOTE — Telephone Encounter (Signed)
No coupons/referral made

## 2017-12-20 NOTE — Addendum Note (Signed)
Addended by: Sharon Seller B on: 12/20/2017 07:51 AM   Modules accepted: Orders

## 2017-12-27 NOTE — Progress Notes (Signed)
Cardiology Office Note:    Date:  12/28/2017   ID:  Jenna Campbell, DOB [birth year], MRN [id number]  PCP:  Shelda Pal, DO  Cardiologist:  Shirlee More, MD    Referring MD: Shelda Pal*    ASSESSMENT:    1. Chest pain in adult   2. Essential hypertension   3. Venous insufficiency (chronic) (peripheral)   4. Mild persistent asthma without complication    PLAN:    In order of problems listed above:  1. She is reassured by the findings of a normal ventilation/perfusion lung scan and is improved and is not having chest pain at this time.  I do not think she requires further cardiac evaluation I will see back in the office needed.   2. Stable 3. Improved but still has edema related to venous insufficiency and gabapentin which causes marked sodium retention 4. Improved much less cough shortness of breath and compliant with her bronchodilators   Next appointment: As needed   Medication Adjustments/Labs and Tests Ordered: Current medicines are reviewed at length with the patient today.  Concerns regarding medicines are outlined above.  No orders of the defined types were placed in this encounter.  No orders of the defined types were placed in this encounter.   Chief Complaint  Patient presents with  . Follow-up  . Chest Pain  . Shortness of Breath    History of Present Illness:    Jenna Campbell is a 67 y.o. female with a hx of costochondral chest pain asthma hypertension and a normal myocardial perfusion image 03/15/2017 last seen 09/03/17.    She was at [facility] ED 11/28/2017 with chest pain to normal troponins felt to be nonischemic in nature stable EKG and discharge from the emergency room.    She was  last seen 12/05/17. CXR and lung scan at that time were both normal.  Compliance with diet, lifestyle and medications: Yes  Improved not having chest pain less cough wheezing and shortness of breath is resolved.  She decrease the dose of gabapentin  still has edema but improved Past Medical History:  Diagnosis Date  . Adjustment disorder with anxious mood 04/21/2015  . Allergic rhinitis due to pollen 07/23/2014  . Alopecia 10/30/2013  . Arthritis 04/24/2016  . Atrophic vaginitis 10/30/2013  . Basal cell carcinoma 04/21/2015  . Cancer ([place])    skin cancer  . Cervical radiculopathy 09/17/2015  . Change in bowel function 06/14/2016  . Chronic diarrhea 12/16/2015  . Cystocele, midline 10/30/2013  . Dermatitis 09/22/2014  . Diverticulosis of large intestine 10/30/2013  . Dyspareunia 07/23/2014  . Essential hypertension   . Family history of pheochromocytoma 02/14/2016  . GERD (gastroesophageal reflux disease)   . Herpes simplex 08/17/2016  . History of adenomatous polyp of colon 06/14/2016  . History of blood transfusion 1970  . Hypothyroidism 10/30/2013  . Hypotonic bladder 10/30/2013  . Lumbar paraspinal muscle spasm 04/21/2015  . Major depression, recurrent ([place]) 07/14/2013  . Malaise and fatigue 04/21/2015  . MCI (mild cognitive impairment) 08/18/2015  . Menopause 03/31/2014  . Migraine   . Mild persistent asthma 05/13/2015  . Neuropathy 09/22/2014  . Personality disorder ([place]) 10/19/2012  . Pharyngoesophageal dysphagia 04/21/2015  . Plantar fasciitis 01/20/2016  . Precordial chest pain 08/18/2015  . Prolapse of vaginal vault after hysterectomy 10/30/2013  . Rectocele 10/30/2013  . Sinusitis, chronic 10/30/2013  . Slow transit constipation 07/23/2014  . Swelling of lower limb 05/17/2016  . Tension-type headache, not intractable 09/17/2015  .  Urethral stricture 10/30/2013  . Urinary incontinence without sensory awareness 07/17/2016  . Varicose veins of both lower extremities 04/24/2016  . Venous insufficiency (chronic) (peripheral) 06/06/2016    Past Surgical History:  Procedure Laterality Date  . BREAST CYST ASPIRATION Left    25 years ago   . MOHS SURGERY  10/05/2015  . RECTOCELE REPAIR  2011  . SKIN CANCER EXCISION  02/2015   Squamous cell removed  from back  . TONSILLECTOMY    . VAGINAL HYSTERECTOMY    . VAGINAL PROLAPSE REPAIR      Current Medications: Current Meds  Medication Sig  . albuterol (PROAIR HFA) 108 (90 Base) MCG/ACT inhaler Inhale 2 puffs into the lungs every 4 (four) hours as needed for wheezing or shortness of breath.  Marland Kitchen albuterol (PROVENTIL) (2.5 MG/3ML) 0.083% nebulizer solution Take 3 mLs (2.5 mg total) by nebulization every 6 (six) hours as needed for wheezing or shortness of breath.  . fexofenadine (ALLEGRA) 180 MG tablet Take 180 mg by mouth daily.  . fluticasone (FLONASE) 50 MCG/ACT nasal spray Place 1 spray into both nostrils daily.  . fluticasone (FLOVENT HFA) 110 MCG/ACT inhaler Inhale 2 puffs into the lungs 2 (two) times daily.  Marland Kitchen gabapentin (NEURONTIN) 300 MG capsule Take 300 mg by mouth 2 (two) times daily.  Marland Kitchen levothyroxine (SYNTHROID, LEVOTHROID) 150 MCG tablet Take 1.5 tablets (225 mcg total) by mouth daily before breakfast.  . LORazepam (ATIVAN) 0.5 MG tablet Take 0.5 mg by mouth every 8 (eight) hours as needed.   . meloxicam (MOBIC) 15 MG tablet Take 1 tablet (15 mg total) by mouth daily.  . Mometasone Furoate (ASMANEX HFA) 100 MCG/ACT AERO Inhale 1 puff into the lungs daily.  Marland Kitchen omeprazole (PRILOSEC) 40 MG capsule Take 40 mg by mouth daily.  . temazepam (RESTORIL) 30 MG capsule :take 1 cap at night for sleep (to replace 15mg  size)  . tiZANidine (ZANAFLEX) 2 MG tablet Take 1 tablet (2 mg total) by mouth every 8 (eight) hours as needed for muscle spasms.  Marland Kitchen topiramate (TOPAMAX) 100 MG tablet Take 100 mg by mouth 2 (two) times daily.     Allergies:   Iodinated diagnostic agents; Kenalog [triamcinolone acetonide]; Metrizamide; Other; Montelukast sodium; Penicillins; Ciprofloxacin; Diazepam; and Sulfa antibiotics   Social History   Socioeconomic History  . Marital status: Divorced    Spouse name: Not on file  . Number of children: Not on file  . Years of education: Not on file  . Highest education  level: Not on file  Occupational History  . Not on file  Social Needs  . Financial resource strain: Not on file  . Food insecurity:    Worry: Not on file    Inability: Not on file  . Transportation needs:    Medical: Not on file    Non-medical: Not on file  Tobacco Use  . Smoking status: Never Smoker  . Smokeless tobacco: Never Used  Substance and Sexual Activity  . Alcohol use: Yes  . Drug use: No  . Sexual activity: Never    Partners: Male  Lifestyle  . Physical activity:    Days per week: Not on file    Minutes per session: Not on file  . Stress: Not on file  Relationships  . Social connections:    Talks on phone: Not on file    Gets together: Not on file    Attends religious service: Not on file    Active member of club or organization:  Not on file    Attends meetings of clubs or organizations: Not on file    Relationship status: Not on file  Other Topics Concern  . Not on file  Social History Narrative  . Not on file     Family History: The patient's family history includes Alzheimer's disease in her mother; Angina in her father; Asthma in her father; Cancer in her sister; Emphysema in her father. ROS:   Please see the history of present illness.    All other systems reviewed and are negative.  EKGs/Labs/Other Studies Reviewed:    The following studies were reviewed today:   Recent Labs: 04/12/2017: TSH 3.11 11/28/2017: ALT 16; BUN 11; Creatinine, Ser 0.62; Hemoglobin 14.0; Platelets 288; Potassium 3.9; Sodium 135  Recent Lipid Panel No results found for: CHOL, TRIG, HDL, CHOLHDL, VLDL, LDLCALC, LDLDIRECT  Physical Exam:    VS:  BP 110/70 (BP Location: Right Arm, Patient Position: Sitting, Cuff Size: Normal)   Pulse 65   Ht 5\' 6"  (1.676 m)   Wt 143 lb (64.9 kg)   SpO2 97%   BMI 23.08 kg/m     Wt Readings from Last 3 Encounters:  12/28/17 143 lb (64.9 kg)  12/13/17 144 lb (65.3 kg)  12/05/17 144 lb (65.3 kg)     GEN:  Well nourished, well  developed in no acute distress HEENT: Normal NECK: No JVD; No carotid bruits LYMPHATICS: No lymphadenopathy CARDIAC: RRR, no murmurs, rubs, gallops RESPIRATORY:  Clear to auscultation without rales, wheezing or rhonchi  ABDOMEN: Soft, non-tender, non-distended MUSCULOSKELETAL:  No edema; No deformity  SKIN: Warm and dry NEUROLOGIC:  Alert and oriented x 3 PSYCHIATRIC:  Normal affect    Signed, Shirlee More, MD  12/28/2017 11:01 AM    [place]

## 2017-12-28 ENCOUNTER — Ambulatory Visit (INDEPENDENT_AMBULATORY_CARE_PROVIDER_SITE_OTHER): Payer: PPO | Admitting: Cardiology

## 2017-12-28 ENCOUNTER — Encounter: Payer: Self-pay | Admitting: Cardiology

## 2017-12-28 VITALS — BP 110/70 | HR 65 | Ht 66.0 in | Wt 143.0 lb

## 2017-12-28 DIAGNOSIS — I10 Essential (primary) hypertension: Secondary | ICD-10-CM | POA: Diagnosis not present

## 2017-12-28 DIAGNOSIS — R079 Chest pain, unspecified: Secondary | ICD-10-CM | POA: Diagnosis not present

## 2017-12-28 DIAGNOSIS — I872 Venous insufficiency (chronic) (peripheral): Secondary | ICD-10-CM | POA: Diagnosis not present

## 2017-12-28 DIAGNOSIS — J4530 Mild persistent asthma, uncomplicated: Secondary | ICD-10-CM | POA: Diagnosis not present

## 2017-12-28 MED ORDER — NITROGLYCERIN 0.4 MG SL SUBL: 0.4000 mg | SUBLINGUAL_TABLET | SUBLINGUAL | 11 refills | Status: DC | PRN

## 2017-12-28 MED FILL — NITROGLYCERIN 0.4 MG TAB SL: 0.4 | 25 days supply | Qty: 25 | Fill #0

## 2017-12-28 NOTE — Patient Instructions (Addendum)
Medication Instructions:  Your physician recommends that you continue on your current medications as directed. Please refer to the Current Medication list given to you today.   Labwork: None  Testing/Procedures: None  Follow-Up: Your physician recommends that you schedule a follow-up appointment as needed if symptoms worsen or fail to improve.   If you need a refill on your cardiac medications before your next appointment, please call your pharmacy.   Thank you for choosing [facility]! Robyne Peers, RN [phone]      Costochondritis Costochondritis is swelling and irritation (inflammation) of the tissue (cartilage) that connects your ribs to your breastbone (sternum). This causes pain in the front of your chest. Usually, the pain:  Starts gradually.  Is in more than one rib.  This condition usually goes away on its own over time. Follow these instructions at home:  Do not do anything that makes your pain worse.  If directed, put ice on the painful area: ? Put ice in a plastic bag. ? Place a towel between your skin and the bag. ? Leave the ice on for 20 minutes, 2-3 times a day.  If directed, put heat on the affected area as often as told by your doctor. Use the heat source that your doctor tells you to use, such as a moist heat pack or a heating pad. ? Place a towel between your skin and the heat source. ? Leave the heat on for 20-30 minutes. ? Take off the heat if your skin turns bright red. This is very important if you cannot feel pain, heat, or cold. You may have a greater risk of getting burned.  Take over-the-counter and prescription medicines only as told by your doctor.  Return to your normal activities as told by your doctor. Ask your doctor what activities are safe for you.  Keep all follow-up visits as told by your doctor. This is important. Contact a doctor if:  You have chills or a fever.  Your pain does not go away or it gets  worse.  You have a cough that does not go away. Get help right away if:  You are short of breath. This information is not intended to replace advice given to you by your health care provider. Make sure you discuss any questions you have with your health care provider. Document Released: 10/18/2007 Document Revised: 11/19/2015 Document Reviewed: 08/25/2015 Elsevier Interactive Patient Education  [employer].

## 2018-01-03 DIAGNOSIS — M79672 Pain in left foot: Secondary | ICD-10-CM | POA: Diagnosis not present

## 2018-01-03 DIAGNOSIS — M545 Low back pain: Secondary | ICD-10-CM | POA: Diagnosis not present

## 2018-01-03 DIAGNOSIS — M79671 Pain in right foot: Secondary | ICD-10-CM | POA: Diagnosis not present

## 2018-01-05 DIAGNOSIS — M79672 Pain in left foot: Secondary | ICD-10-CM

## 2018-01-05 DIAGNOSIS — M79671 Pain in right foot: Secondary | ICD-10-CM

## 2018-01-05 HISTORY — DX: Pain in left foot: M79.672

## 2018-01-05 HISTORY — DX: Pain in right foot: M79.671

## 2018-01-10 DIAGNOSIS — F332 Major depressive disorder, recurrent severe without psychotic features: Secondary | ICD-10-CM | POA: Diagnosis not present

## 2018-01-15 DIAGNOSIS — F330 Major depressive disorder, recurrent, mild: Secondary | ICD-10-CM | POA: Diagnosis not present

## 2018-01-15 DIAGNOSIS — F449 Dissociative and conversion disorder, unspecified: Secondary | ICD-10-CM | POA: Diagnosis not present

## 2018-01-17 ENCOUNTER — Ambulatory Visit: Payer: PPO | Admitting: Family Medicine

## 2018-01-23 DIAGNOSIS — F332 Major depressive disorder, recurrent severe without psychotic features: Secondary | ICD-10-CM | POA: Diagnosis not present

## 2018-01-28 DIAGNOSIS — F332 Major depressive disorder, recurrent severe without psychotic features: Secondary | ICD-10-CM | POA: Diagnosis not present

## 2018-01-31 ENCOUNTER — Encounter: Payer: Self-pay | Admitting: Family Medicine

## 2018-01-31 ENCOUNTER — Ambulatory Visit (HOSPITAL_BASED_OUTPATIENT_CLINIC_OR_DEPARTMENT_OTHER)
Admission: RE | Admit: 2018-01-31 | Discharge: 2018-01-31 | Disposition: A | Discharge status: Home / Self Care | Payer: PPO | Source: Ambulatory Visit | Attending: Family Medicine | Admitting: Family Medicine

## 2018-01-31 ENCOUNTER — Ambulatory Visit (INDEPENDENT_AMBULATORY_CARE_PROVIDER_SITE_OTHER): Payer: PPO | Admitting: Family Medicine

## 2018-01-31 VITALS — BP 130/82 | HR 57 | Temp 97.7°F | Ht 66.0 in | Wt 145.1 lb

## 2018-01-31 DIAGNOSIS — L239 Allergic contact dermatitis, unspecified cause: Secondary | ICD-10-CM | POA: Diagnosis not present

## 2018-01-31 DIAGNOSIS — M79605 Pain in left leg: Secondary | ICD-10-CM

## 2018-01-31 DIAGNOSIS — K5900 Constipation, unspecified: Secondary | ICD-10-CM

## 2018-01-31 DIAGNOSIS — M7732 Calcaneal spur, left foot: Secondary | ICD-10-CM | POA: Diagnosis not present

## 2018-01-31 DIAGNOSIS — G44209 Tension-type headache, unspecified, not intractable: Secondary | ICD-10-CM | POA: Diagnosis not present

## 2018-01-31 DIAGNOSIS — M79604 Pain in right leg: Secondary | ICD-10-CM

## 2018-01-31 DIAGNOSIS — M1712 Unilateral primary osteoarthritis, left knee: Secondary | ICD-10-CM | POA: Insufficient documentation

## 2018-01-31 DIAGNOSIS — H9203 Otalgia, bilateral: Secondary | ICD-10-CM | POA: Diagnosis not present

## 2018-01-31 DIAGNOSIS — R609 Edema, unspecified: Secondary | ICD-10-CM | POA: Insufficient documentation

## 2018-01-31 DIAGNOSIS — R600 Localized edema: Secondary | ICD-10-CM | POA: Diagnosis not present

## 2018-01-31 MED ORDER — CETIRIZINE HCL 10 MG PO TABS: 10.0000 mg | ORAL_TABLET | Freq: Every day | ORAL | 5 refills | Status: DC

## 2018-01-31 MED ORDER — HYDROCORTISONE-ACETIC ACID 1-2 % OT SOLN: 4.0000 [drp] | Freq: Four times a day (QID) | OTIC | 0 refills | Status: DC

## 2018-01-31 NOTE — Progress Notes (Signed)
Chief Complaint  Patient presents with  . Medication Problem    night pain  . Constipation  . Rash    Subjective: Patient is a 67 y.o. female here for a handful of issues.   Itchy rash on arms, back and torso that bothres in evening. Takes Benadryl and gets better. Also takes fexofenadine. No new topicals.   Has been having constipation for several days now. Has been using laxatives and fiber without relief. No bleeding.  Has been experiencing numerous family tragedies lately and associates a headache with this. No neurologic s/s's. For her HA, she has been using Tylenol.   Also having pain in hands and legs at night. Wondering if CBD is going to be helpful.   B/l ear pain did well with steroid ear drops. Hoping for a refill. Saw ENT but they would not do surgery because of anesthesia concerns.   ROS: GI: +constipation HEENT: +ear pain  Past Medical History:  Diagnosis Date  . Adjustment disorder with anxious mood 04/21/2015  . Allergic rhinitis due to pollen 07/23/2014  . Alopecia 10/30/2013  . Arthritis 04/24/2016  . Atrophic vaginitis 10/30/2013  . Basal cell carcinoma 04/21/2015  . Cancer ([place])    skin cancer  . Cervical radiculopathy 09/17/2015  . Change in bowel function 06/14/2016  . Chronic diarrhea 12/16/2015  . Cystocele, midline 10/30/2013  . Dermatitis 09/22/2014  . Diverticulosis of large intestine 10/30/2013  . Dyspareunia 07/23/2014  . Essential hypertension   . Family history of pheochromocytoma 02/14/2016  . GERD (gastroesophageal reflux disease)   . Herpes simplex 08/17/2016  . History of adenomatous polyp of colon 06/14/2016  . History of blood transfusion 1970  . Hypothyroidism 10/30/2013  . Hypotonic bladder 10/30/2013  . Lumbar paraspinal muscle spasm 04/21/2015  . Major depression, recurrent ([place]) 07/14/2013  . Malaise and fatigue 04/21/2015  . MCI (mild cognitive impairment) 08/18/2015  . Menopause 03/31/2014  . Migraine   . Mild persistent asthma 05/13/2015  .  Neuropathy 09/22/2014  . Personality disorder ([place]) 10/19/2012  . Pharyngoesophageal dysphagia 04/21/2015  . Plantar fasciitis 01/20/2016  . Precordial chest pain 08/18/2015  . Prolapse of vaginal vault after hysterectomy 10/30/2013  . Rectocele 10/30/2013  . Sinusitis, chronic 10/30/2013  . Slow transit constipation 07/23/2014  . Swelling of lower limb 05/17/2016  . Tension-type headache, not intractable 09/17/2015  . Urethral stricture 10/30/2013  . Urinary incontinence without sensory awareness 07/17/2016  . Varicose veins of both lower extremities 04/24/2016  . Venous insufficiency (chronic) (peripheral) 06/06/2016    Objective: BP 130/82 (BP Location: Left Arm, Patient Position: Sitting, Cuff Size: Normal)   Pulse (!) 57   Temp 97.7 F (36.5 C) (Oral)   Ht 5\' 6"  (1.676 m)   Wt 145 lb 2 oz (65.8 kg)   SpO2 97%   BMI 23.42 kg/m  General: Awake, appears stated age HEENT: MMM, EOMi, ears neg Heart: RRR, no bruits, no LE edema Lungs: CTAB, no rales, wheezes or rhonchi. No accessory muscle use Abd: BS+, soft, ttp in llq, no masses or organomegaly. MSK: +TTP over tibia b/l prox portion of middle 1/3, no crepitus or edema; +TTP over cerv parasp msc b/l Psych: Age appropriate judgment and insight, normal affect and mood  Assessment and Plan: Allergic dermatitis - Plan: cetirizine (ZYRTEC ALLERGY) 10 MG tablet  Acute non intractable tension-type headache  Pain in both lower extremities - Plan: DG Tibia/Fibula Left, DG Tibia/Fibula Right  Constipation, unspecified constipation type  Otalgia of both ears -  Plan: acetic acid-hydrocortisone (VOSOL-HC) OTIC solution  Increase strength of PO antihistamine.  Offered toradol for HA, but never really got benefit from it in past. Heat, Tylenol, neck stretches/stretches.  MiraLAX 1-2 times per day for the next 3 days.  If no improvement, try an enema. We will refill analgesic drops. Follow-up in 4 weeks to recheck. The patient voiced understanding and  agreement to the plan.  [place], DO 01/31/18  12:03 PM

## 2018-01-31 NOTE — Patient Instructions (Addendum)
Stop fexofenadine and start cetirizine (Zyrtec).  I think this will help with your rash.  I do not have issues with you starting CBD oil.   Consider yoga.   Aim to do some physical exertion for 150 minutes per week. This is typically divided into 5 days per week, 30 minutes per day. The activity should be enough to get your heart rate up. Anything is better than nothing if you have time constraints. Consider lifting weights as well.  Try MiraLAX 1-2 times daily over the next 3-4 days. If no improvement, try using an enema. Stay well hydrated and keep lots of fiber in your diet.  Give [place] 1-2 business days to get the results of your X-rays back.     Let us know if you need anything.

## 2018-01-31 NOTE — Progress Notes (Signed)
Pre visit review using our clinic review tool, if applicable. No additional management support is needed unless otherwise documented below in the visit note. 

## 2018-02-07 DIAGNOSIS — F332 Major depressive disorder, recurrent severe without psychotic features: Secondary | ICD-10-CM | POA: Diagnosis not present

## 2018-02-28 ENCOUNTER — Encounter: Payer: Self-pay | Admitting: Family Medicine

## 2018-02-28 ENCOUNTER — Ambulatory Visit (INDEPENDENT_AMBULATORY_CARE_PROVIDER_SITE_OTHER): Payer: PPO | Admitting: Family Medicine

## 2018-02-28 ENCOUNTER — Ambulatory Visit: Payer: PPO | Admitting: Family Medicine

## 2018-02-28 VITALS — BP 122/82 | HR 61 | Temp 98.1°F | Ht 66.0 in | Wt 144.5 lb

## 2018-02-28 DIAGNOSIS — H9313 Tinnitus, bilateral: Secondary | ICD-10-CM | POA: Diagnosis not present

## 2018-02-28 DIAGNOSIS — K5900 Constipation, unspecified: Secondary | ICD-10-CM

## 2018-02-28 DIAGNOSIS — M545 Low back pain, unspecified: Secondary | ICD-10-CM

## 2018-02-28 DIAGNOSIS — L239 Allergic contact dermatitis, unspecified cause: Secondary | ICD-10-CM | POA: Diagnosis not present

## 2018-02-28 MED ORDER — PREDNISONE 20 MG PO TABS: 40.0000 mg | ORAL_TABLET | Freq: Every day | ORAL | 0 refills | Status: AC

## 2018-02-28 NOTE — Patient Instructions (Signed)

## 2018-02-28 NOTE — Progress Notes (Signed)
Pre visit review using our clinic review tool, if applicable. No additional management support is needed unless otherwise documented below in the visit note. 

## 2018-02-28 NOTE — Progress Notes (Signed)
Chief Complaint  Patient presents with  . Follow-up    Subjective: Patient is a 67 y.o. female here for f/u.  Seen around 1 mo ago for skin issue. Started on stronger PO antihistamine and reports improvement. She was unable to get rx 2/2 cost.   Tx'd for constipation w OTC recs. This is improved.   HA improved.   Ears are somewhat better, no pain. Does have sensitivity to sound and some ringing in her ears.   Tweaked back the other day and having pain after moving trash outside. No neurologic s/s's. Tylenol at home not fully helpful.   ROS: Neuro: No neurologic s/s's MSK: +lbp  Past Medical History:  Diagnosis Date  . Adjustment disorder with anxious mood 04/21/2015  . Allergic rhinitis due to pollen 07/23/2014  . Alopecia 10/30/2013  . Arthritis 04/24/2016  . Atrophic vaginitis 10/30/2013  . Basal cell carcinoma 04/21/2015  . Cancer ([place])    skin cancer  . Cervical radiculopathy 09/17/2015  . Change in bowel function 06/14/2016  . Chronic diarrhea 12/16/2015  . Cystocele, midline 10/30/2013  . Dermatitis 09/22/2014  . Diverticulosis of large intestine 10/30/2013  . Dyspareunia 07/23/2014  . Essential hypertension   . Family history of pheochromocytoma 02/14/2016  . GERD (gastroesophageal reflux disease)   . Herpes simplex 08/17/2016  . History of adenomatous polyp of colon 06/14/2016  . History of blood transfusion 1970  . Hypothyroidism 10/30/2013  . Hypotonic bladder 10/30/2013  . Lumbar paraspinal muscle spasm 04/21/2015  . Major depression, recurrent ([place]) 07/14/2013  . Malaise and fatigue 04/21/2015  . MCI (mild cognitive impairment) 08/18/2015  . Menopause 03/31/2014  . Migraine   . Mild persistent asthma 05/13/2015  . Neuropathy 09/22/2014  . Personality disorder ([place]) 10/19/2012  . Pharyngoesophageal dysphagia 04/21/2015  . Plantar fasciitis 01/20/2016  . Precordial chest pain 08/18/2015  . Prolapse of vaginal vault after hysterectomy 10/30/2013  . Rectocele 10/30/2013  . Sinusitis,  chronic 10/30/2013  . Slow transit constipation 07/23/2014  . Swelling of lower limb 05/17/2016  . Tension-type headache, not intractable 09/17/2015  . Urethral stricture 10/30/2013  . Urinary incontinence without sensory awareness 07/17/2016  . Varicose veins of both lower extremities 04/24/2016  . Venous insufficiency (chronic) (peripheral) 06/06/2016    Objective: BP 122/82 (BP Location: Left Arm, Patient Position: Sitting, Cuff Size: Normal)   Pulse 61   Temp 98.1 F (36.7 C) (Oral)   Ht 5\' 6"  (1.676 m)   Wt 144 lb 8 oz (65.5 kg)   SpO2 97%   BMI 23.32 kg/m  General: Awake, appears stated age MSK: +TTP over thoracolumbar paraspinal msc bl, neg straight leg Neuro: No cerebellar signs, normal DTR's b/l, no clonus Heart: RRR, no murmurs Lungs: CTAB, no rales, wheezes or rhonchi. No accessory muscle use Psych: Age appropriate judgment and insight, normal affect and mood  Assessment and Plan: Acute bilateral low back pain without sciatica - Plan: predniSONE (DELTASONE) 20 MG tablet  Allergic dermatitis  Tinnitus of both ears  Constipation, unspecified constipation type  Tylenol, steroid (pt has ben on pred in past and did OK), heat, stretches/exercises. Skin has improved. Ear issue is tinnitus and noise sensitivity.  Constipation resolved. HA's resolved.  F/u prn.  The patient voiced understanding and agreement to the plan.  [place], DO 02/28/18  2:29 PM

## 2018-03-09 ENCOUNTER — Other Ambulatory Visit: Payer: Self-pay | Admitting: Family Medicine

## 2018-03-27 ENCOUNTER — Ambulatory Visit (INDEPENDENT_AMBULATORY_CARE_PROVIDER_SITE_OTHER): Payer: PPO | Admitting: Medical

## 2018-03-27 ENCOUNTER — Encounter: Payer: Self-pay | Admitting: Medical

## 2018-03-27 VITALS — BP 155/90 | HR 70 | Temp 98.2°F | Resp 16 | Ht 66.0 in | Wt 146.6 lb

## 2018-03-27 DIAGNOSIS — J3489 Other specified disorders of nose and nasal sinuses: Secondary | ICD-10-CM | POA: Diagnosis not present

## 2018-03-27 DIAGNOSIS — R300 Dysuria: Secondary | ICD-10-CM

## 2018-03-27 DIAGNOSIS — R319 Hematuria, unspecified: Secondary | ICD-10-CM

## 2018-03-27 DIAGNOSIS — R0981 Nasal congestion: Secondary | ICD-10-CM

## 2018-03-27 DIAGNOSIS — R350 Frequency of micturition: Secondary | ICD-10-CM

## 2018-03-27 DIAGNOSIS — R51 Headache: Secondary | ICD-10-CM | POA: Diagnosis not present

## 2018-03-27 DIAGNOSIS — R519 Headache, unspecified: Secondary | ICD-10-CM

## 2018-03-27 LAB — POC URINALSYSI DIPSTICK (AUTOMATED)
Bilirubin, UA: NEGATIVE
Glucose, UA: NEGATIVE
Ketones, UA: NEGATIVE
Leukocytes, UA: NEGATIVE
Nitrite, UA: NEGATIVE
Protein, UA: NEGATIVE
Spec Grav, UA: 1.015 (ref 1.010–1.025)
Urobilinogen, UA: NEGATIVE E.U./dL — AB
pH, UA: 7 (ref 5.0–8.0)

## 2018-03-27 MED ORDER — CEFDINIR 300 MG PO CAPS: 300.0000 mg | ORAL_CAPSULE | Freq: Two times a day (BID) | ORAL | 0 refills | Status: DC

## 2018-03-27 MED ORDER — ONDANSETRON 4 MG PO TBDP: 4.0000 mg | ORAL_TABLET | Freq: Three times a day (TID) | ORAL | 0 refills | Status: DC | PRN

## 2018-03-27 MED ORDER — AMLODIPINE BESYLATE 5 MG PO TABS: 5.0000 mg | ORAL_TABLET | Freq: Every day | ORAL | 0 refills | Status: DC

## 2018-03-27 MED ORDER — AZELASTINE HCL 0.1 % NA SOLN: 2.0000 | Freq: Two times a day (BID) | NASAL | 12 refills | Status: DC

## 2018-03-27 NOTE — Progress Notes (Signed)
Subjective:    Patient ID: Jenna Campbell, female    DOB: 11-Jul-1950, 67 y.o.   MRN: [id number]  HPI Pt in today reporting urinary symptoms.  Dysuria- yes Frequent urination-yes Hesitancy-yes Suprapubic pressure-yes Fever-no chills-no Nausea-yes Vomiting- CVA pain- History of UTI-yes but not chronic. Gross hematuria- no gross hematuria but 3+ blood on ua.  Pt has hx of migraines. She states almost daily.   She also notes recent nasal congestion and sinus pressure x 2 days.. Some rt side lower tooth pain and she thinks she needs to see dentist.  Pt allergie section states allergic to pcn, bactrim ds and cipro. She thinks maybe allrergic to keflex. She states only thinks. Can't say for sure.  Pt in march took macrobid with no reaction.  Also prior use doxycycline on review but    Review of Systems  Constitutional: Negative for chills, fatigue and fever.  HENT: Positive for congestion, sinus pressure and sinus pain.   Eyes: Positive for photophobia. Negative for pain, itching and visual disturbance.  Respiratory: Negative for choking, chest tightness, shortness of breath and wheezing.   Cardiovascular: Negative for chest pain and palpitations.  Gastrointestinal: Negative for abdominal pain.  Genitourinary: Positive for dysuria, frequency and urgency.  Musculoskeletal: Positive for back pain.       Rt cva faint pain.  Skin: Negative for rash.  Neurological: Negative for dizziness, seizures, syncope, weakness and light-headedness.  Hematological: Negative for adenopathy. Does not bruise/bleed easily.  Psychiatric/Behavioral: Negative for behavioral problems, confusion and sleep disturbance. The patient is not nervous/anxious.         Objective:   Physical Exam  General  Mental Status - Alert. General Appearance - Well groomed. Not in acute distress.  Skin Rashes- No Rashes.  HEENT Head- Normal. Ear Auditory Canal - Left- Normal. Right - Normal.Tympanic  Membrane- Left- Normal. Right- Normal. Eye Sclera/Conjunctiva- Left- Normal. Right- Normal. Nose & Sinuses Nasal Mucosa- Left-  Boggy and Congested. Right-  Boggy and  Congested.Bilateral faint maxillary and faint frontal sinus pressure. Mouth & Throat Lips: Upper Lip- Normal: no dryness, cracking, pallor, cyanosis, or vesicular eruption. Lower Lip-Normal: no dryness, cracking, pallor, cyanosis or vesicular eruption. Buccal Mucosa- Bilateral- No Aphthous ulcers. Oropharynx- No Discharge or Erythema. Tonsils: Characteristics- Bilateral- No Erythema or Congestion. Size/Enlargement- Bilateral- No enlargement. Discharge- bilateral-None.  Neck Neck- Supple. No Masses.   Chest and Lung Exam Auscultation: Breath Sounds:-Clear even and unlabored.  Cardiovascular Auscultation:Rythm- Regular, rate and rhythm. Murmurs & Other Heart Sounds:Ausculatation of the heart reveal- No Murmurs.  Lymphatic Head & Neck General Head & Neck Lymphatics: Bilateral: Description- No Localized lymphadenopathy.  Abdomen- soft, +bs, no reboudn or guarding. Faint suprapubic tenderness.  Back- faint rt  cva tenderness.  Neuro- CN III- XII grossly intact.        Assessment & Plan:  You do have some recent UTI type symptoms, possible sinus infection symptoms and some right lower teeth region pain.  Your allergy medication list is limiting but I did talk to your pharmacist and within the last year you are on Cefdnir antibiotic and you did not have reaction.  I think this antibiotic will cover potential infection better than other antibiotics which you have proven to been on with no reaction.  For nasal congestion, use Flonase and I am adding Astelin.  Awaiting urine culture.   For ha continue current regimen neurologist has you on. If worse ha or neurologist symptom then ED evaluation.   Follow up in 7-10 days or  as needed  [employer], PA-C

## 2018-03-27 NOTE — Patient Instructions (Addendum)
You do have some recent UTI type symptoms, possible sinus infection symptoms and some right lower teeth region pain.  Your allergy medication list is limiting but I did talk to your pharmacist and within the last year you are on Cefdnir antibiotic and you did not have reaction.  I think this antibiotic will cover potential infection better than other antibiotics which you have proven to been on with no reaction.  For nasal congestion, use Flonase and I am adding Astelin.  Awaiting urine culture.   For ha continue current regimen neurologist has you on. If worse ha or neurologist symptom then ED evaluation.   BP is higher than usual. Ask you check your bp daily. Will give rx amlodipine 5 mg daily. If bp is above 140/90 then start.  Follow up in 7-10 days or as needed

## 2018-03-28 ENCOUNTER — Encounter: Payer: Self-pay | Admitting: Medical

## 2018-03-28 LAB — URINE CULTURE
MICRO NUMBER:: 91367373
Result:: NO GROWTH
SPECIMEN QUALITY:: ADEQUATE

## 2018-04-08 ENCOUNTER — Ambulatory Visit: Payer: PPO | Admitting: Medical

## 2018-04-08 DIAGNOSIS — F332 Major depressive disorder, recurrent severe without psychotic features: Secondary | ICD-10-CM | POA: Diagnosis not present

## 2018-05-13 DIAGNOSIS — F332 Major depressive disorder, recurrent severe without psychotic features: Secondary | ICD-10-CM | POA: Diagnosis not present

## 2018-05-14 DIAGNOSIS — G44229 Chronic tension-type headache, not intractable: Secondary | ICD-10-CM | POA: Diagnosis not present

## 2018-05-14 DIAGNOSIS — M545 Low back pain: Secondary | ICD-10-CM | POA: Diagnosis not present

## 2018-05-14 DIAGNOSIS — R413 Other amnesia: Secondary | ICD-10-CM | POA: Diagnosis not present

## 2018-05-31 ENCOUNTER — Encounter: Payer: PPO | Admitting: Family Medicine

## 2018-06-05 DIAGNOSIS — F332 Major depressive disorder, recurrent severe without psychotic features: Secondary | ICD-10-CM | POA: Diagnosis not present

## 2018-06-06 ENCOUNTER — Other Ambulatory Visit: Payer: Self-pay | Admitting: Family Medicine

## 2018-06-13 DIAGNOSIS — F330 Major depressive disorder, recurrent, mild: Secondary | ICD-10-CM | POA: Diagnosis not present

## 2018-06-13 DIAGNOSIS — F449 Dissociative and conversion disorder, unspecified: Secondary | ICD-10-CM | POA: Diagnosis not present

## 2018-06-13 DIAGNOSIS — F4322 Adjustment disorder with anxiety: Secondary | ICD-10-CM | POA: Diagnosis not present

## 2018-07-04 DIAGNOSIS — S4991XA Unspecified injury of right shoulder and upper arm, initial encounter: Secondary | ICD-10-CM | POA: Diagnosis not present

## 2018-07-04 DIAGNOSIS — M5412 Radiculopathy, cervical region: Secondary | ICD-10-CM | POA: Diagnosis not present

## 2018-07-04 DIAGNOSIS — M542 Cervicalgia: Secondary | ICD-10-CM | POA: Diagnosis not present

## 2018-07-04 DIAGNOSIS — M545 Low back pain: Secondary | ICD-10-CM | POA: Diagnosis not present

## 2018-07-05 NOTE — Progress Notes (Deleted)
Subjective:   Jenna Campbell is a 68 y.o. female who presents for Medicare Annual (Subsequent) preventive examination.  Review of Systems: No ROS.  Medicare Wellness Visit. Additional risk factors are reflected in the social history.   Sleep patterns: Home Safety/Smoke Alarms: Feels safe in home. Smoke alarms in place.     Female:        Mammo-       Dexa scan-        CCS- last reported 04/2016- 3 yr recall     Objective:     Vitals: There were no vitals taken for this visit.  There is no height or weight on file to calculate BMI.  Advanced Directives 11/28/2017  Does Patient Have a Medical Advance Directive? No  Would patient like information on creating a medical advance directive? No - Patient declined    Tobacco Social History   Tobacco Use  Smoking Status Never Smoker  Smokeless Tobacco Never Used     Counseling given: Not Answered   Clinical Intake:                       Past Medical History:  Diagnosis Date  . Adjustment disorder with anxious mood 04/21/2015  . Allergic rhinitis due to pollen 07/23/2014  . Alopecia 10/30/2013  . Arthritis 04/24/2016  . Atrophic vaginitis 10/30/2013  . Basal cell carcinoma 04/21/2015  . Cancer ([place])    skin cancer  . Cervical radiculopathy 09/17/2015  . Change in bowel function 06/14/2016  . Chronic diarrhea 12/16/2015  . Cystocele, midline 10/30/2013  . Dermatitis 09/22/2014  . Diverticulosis of large intestine 10/30/2013  . Dyspareunia 07/23/2014  . Essential hypertension   . Family history of pheochromocytoma 02/14/2016  . GERD (gastroesophageal reflux disease)   . Herpes simplex 08/17/2016  . History of adenomatous polyp of colon 06/14/2016  . History of blood transfusion 1970  . Hypothyroidism 10/30/2013  . Hypotonic bladder 10/30/2013  . Lumbar paraspinal muscle spasm 04/21/2015  . Major depression, recurrent ([place]) 07/14/2013  . Malaise and fatigue 04/21/2015  . MCI (mild cognitive impairment) 08/18/2015  .  Menopause 03/31/2014  . Migraine   . Mild persistent asthma 05/13/2015  . Neuropathy 09/22/2014  . Personality disorder ([place]) 10/19/2012  . Pharyngoesophageal dysphagia 04/21/2015  . Plantar fasciitis 01/20/2016  . Precordial chest pain 08/18/2015  . Prolapse of vaginal vault after hysterectomy 10/30/2013  . Rectocele 10/30/2013  . Sinusitis, chronic 10/30/2013  . Slow transit constipation 07/23/2014  . Swelling of lower limb 05/17/2016  . Tension-type headache, not intractable 09/17/2015  . Urethral stricture 10/30/2013  . Urinary incontinence without sensory awareness 07/17/2016  . Varicose veins of both lower extremities 04/24/2016  . Venous insufficiency (chronic) (peripheral) 06/06/2016   Past Surgical History:  Procedure Laterality Date  . BREAST CYST ASPIRATION Left    25 years ago   . MOHS SURGERY  10/05/2015  . RECTOCELE REPAIR  2011  . SKIN CANCER EXCISION  02/2015   Squamous cell removed from back  . TONSILLECTOMY    . VAGINAL HYSTERECTOMY    . VAGINAL PROLAPSE REPAIR     Family History  Problem Relation Age of Onset  . Asthma Father   . Angina Father   . Emphysema Father   . Alzheimer's disease Mother   . Cancer Sister        died of cancer   Social History   Socioeconomic History  . Marital status: Divorced    Spouse name:  Not on file  . Number of children: Not on file  . Years of education: Not on file  . Highest education level: Not on file  Occupational History  . Not on file  Social Needs  . Financial resource strain: Not on file  . Food insecurity:    Worry: Not on file    Inability: Not on file  . Transportation needs:    Medical: Not on file    Non-medical: Not on file  Tobacco Use  . Smoking status: Never Smoker  . Smokeless tobacco: Never Used  Substance and Sexual Activity  . Alcohol use: Yes  . Drug use: No  . Sexual activity: Never    Partners: Male  Lifestyle  . Physical activity:    Days per week: Not on file    Minutes per session: Not on  file  . Stress: Not on file  Relationships  . Social connections:    Talks on phone: Not on file    Gets together: Not on file    Attends religious service: Not on file    Active member of club or organization: Not on file    Attends meetings of clubs or organizations: Not on file    Relationship status: Not on file  Other Topics Concern  . Not on file  Social History Narrative  . Not on file    Outpatient Encounter Medications as of 07/08/2018  Medication Sig  . albuterol (PROAIR HFA) 108 (90 Base) MCG/ACT inhaler Inhale 2 puffs into the lungs every 4 (four) hours as needed for wheezing or shortness of breath.  Marland Kitchen albuterol (PROVENTIL) (2.5 MG/3ML) 0.083% nebulizer solution Take 3 mLs (2.5 mg total) by nebulization every 6 (six) hours as needed for wheezing or shortness of breath.  Marland Kitchen amLODipine (NORVASC) 5 MG tablet Take 1 tablet (5 mg total) by mouth daily.  Marland Kitchen azelastine (ASTELIN) 0.1 % nasal spray Place 2 sprays into both nostrils 2 (two) times daily. Use in each nostril as directed  . cefdinir (OMNICEF) 300 MG capsule Take 1 capsule (300 mg total) by mouth 2 (two) times daily.  . fluticasone (FLONASE) 50 MCG/ACT nasal spray Place 1 spray into both nostrils daily.  . fluticasone (FLOVENT HFA) 110 MCG/ACT inhaler Inhale 2 puffs into the lungs 2 (two) times daily.  Marland Kitchen gabapentin (NEURONTIN) 300 MG capsule Take 300 mg by mouth 2 (two) times daily.  Marland Kitchen levothyroxine (SYNTHROID, LEVOTHROID) 150 MCG tablet TAKE 1 AND 1/2 TABLETS (225 MCG TOTAL) BY MOUTH DAILY BEFORE BREAKFAST.  Marland Kitchen LORazepam (ATIVAN) 0.5 MG tablet Take 0.5 mg by mouth every 8 (eight) hours as needed.   . Mometasone Furoate (ASMANEX HFA) 100 MCG/ACT AERO Inhale 1 puff into the lungs daily.  . nitroGLYCERIN (NITROSTAT) 0.4 MG SL tablet Place 1 tablet (0.4 mg total) under the tongue every 5 (five) minutes as needed for chest pain.  Marland Kitchen omeprazole (PRILOSEC) 40 MG capsule Take 40 mg by mouth daily.  . ondansetron (ZOFRAN ODT) 4 MG  disintegrating tablet Take 1 tablet (4 mg total) by mouth every 8 (eight) hours as needed for nausea or vomiting.  . temazepam (RESTORIL) 30 MG capsule :take 1 cap at night for sleep (to replace 15mg  size)  . tiZANidine (ZANAFLEX) 2 MG tablet Take 1 tablet (2 mg total) by mouth every 8 (eight) hours as needed for muscle spasms.  Marland Kitchen topiramate (TOPAMAX) 100 MG tablet Take 100 mg by mouth 2 (two) times daily.   No facility-administered encounter medications on  file as of 07/08/2018.     Activities of Daily Living In your present state of health, do you have any difficulty performing the following activities: 07/05/2017  Hearing? N  Vision? N  Comment wears glasses for driving.   Difficulty concentrating or making decisions? N  Walking or climbing stairs? N  Dressing or bathing? N  Doing errands, shopping? N  Preparing Food and eating ? N  Using the Toilet? N  In the past six months, have you accidently leaked urine? N  Do you have problems with loss of bowel control? N  Managing your Medications? N  Managing your Finances? N  Housekeeping or managing your Housekeeping? N  Some recent data might be hidden    Patient Care Team: Shelda Pal, DO as PCP - General (Family Medicine)    Assessment:   This is a routine wellness examination for [place]. Physical assessment deferred to PCP.  Exercise Activities and Dietary recommendations   Diet (meal preparation, eat out, water intake, caffeinated beverages, dairy products, fruits and vegetables): {Desc; diets:16563} Breakfast: Lunch:  Dinner:      Goals    . Begin using siver sneakers.     Pt reports she joined [employer].        Fall Risk Fall Risk  07/05/2017  Falls in the past year? No    Depression Screen PHQ 2/9 Scores 07/05/2017  PHQ - 2 Score 0     Cognitive Function MMSE - Mini Mental State Exam 07/05/2017  Orientation to time 5  Orientation to Place 5  Registration 3  Attention/ Calculation 5    Recall 3  Language- name 2 objects 2  Language- repeat 1  Language- follow 3 step command 3  Language- read & follow direction 1  Write a sentence 1  Copy design 1  Total score 30        Immunization History  Administered Date(s) Administered  . Influenza, High Dose Seasonal PF 03/01/2017  . Influenza,inj,quad, With Preservative 02/24/2014  . Influenza-Unspecified 02/25/2013, 02/25/2013, 02/24/2014, 01/25/2015, 01/25/2015, 06/30/2016, 06/30/2016  . Pneumococcal Polysaccharide-23 12/22/2014    Screening Tests Health Maintenance  Topic Date Due  . Hepatitis C Screening  04/08/1951  . TETANUS/TDAP  02/06/1969  . DEXA SCAN  02/07/2015  . PNA vac Low Risk Adult (1 of 2 - PCV13) 12/22/2015  . INFLUENZA VACCINE  12/13/2017  . MAMMOGRAM  03/20/2019  . COLONOSCOPY  04/15/2019       Plan:   ***   I have personally reviewed and noted the following in the patient's chart:   . Medical and social history . Use of alcohol, tobacco or illicit drugs  . Current medications and supplements . Functional ability and status . Nutritional status . Physical activity . Advanced directives . List of other physicians . Hospitalizations, surgeries, and ER visits in previous 12 months . Vitals . Screenings to include cognitive, depression, and falls . Referrals and appointments  In addition, I have reviewed and discussed with patient certain preventive protocols, quality metrics, and best practice recommendations. A written personalized care plan for preventive services as well as general preventive health recommendations were provided to patient.     Naaman Plummer [place], [place]  07/05/2018

## 2018-07-08 ENCOUNTER — Ambulatory Visit: Payer: PPO | Admitting: *Deleted

## 2018-07-09 ENCOUNTER — Ambulatory Visit (HOSPITAL_BASED_OUTPATIENT_CLINIC_OR_DEPARTMENT_OTHER)
Admission: RE | Admit: 2018-07-09 | Discharge: 2018-07-09 | Disposition: A | Discharge status: Home / Self Care | Payer: PPO | Source: Ambulatory Visit | Attending: Family | Admitting: Family

## 2018-07-09 ENCOUNTER — Encounter: Payer: Self-pay | Admitting: Family

## 2018-07-09 ENCOUNTER — Telehealth: Payer: Self-pay | Admitting: Family

## 2018-07-09 ENCOUNTER — Ambulatory Visit (INDEPENDENT_AMBULATORY_CARE_PROVIDER_SITE_OTHER): Payer: PPO | Admitting: Family

## 2018-07-09 VITALS — BP 129/69 | HR 70 | Temp 98.3°F | Resp 16 | Ht 66.0 in | Wt 150.0 lb

## 2018-07-09 DIAGNOSIS — B9789 Other viral agents as the cause of diseases classified elsewhere: Secondary | ICD-10-CM

## 2018-07-09 DIAGNOSIS — R05 Cough: Secondary | ICD-10-CM

## 2018-07-09 DIAGNOSIS — R29810 Facial weakness: Secondary | ICD-10-CM

## 2018-07-09 DIAGNOSIS — J069 Acute upper respiratory infection, unspecified: Secondary | ICD-10-CM

## 2018-07-09 DIAGNOSIS — R079 Chest pain, unspecified: Secondary | ICD-10-CM | POA: Diagnosis not present

## 2018-07-09 DIAGNOSIS — R059 Cough, unspecified: Secondary | ICD-10-CM

## 2018-07-09 LAB — POC INFLUENZA A&B (BINAX/QUICKVUE)
Influenza A, POC: NEGATIVE
Influenza B, POC: NEGATIVE

## 2018-07-09 MED ORDER — BENZONATATE 100 MG PO CAPS: 100.0000 mg | ORAL_CAPSULE | Freq: Three times a day (TID) | ORAL | 0 refills | Status: DC | PRN

## 2018-07-09 MED FILL — BENZONATATE 100 MG CAP: 100 | 7 days supply | Qty: 20 | Fill #0

## 2018-07-09 NOTE — Telephone Encounter (Signed)
Patient advised of results and provider's advise 

## 2018-07-09 NOTE — Patient Instructions (Addendum)
Please go to the first floor to complete CT head and chest x-ray.   We will call you with further results and recommendations.  You may use tessalon as needed for cough.  Call if symptoms worsen or if not improved in 3-4 days.

## 2018-07-09 NOTE — Telephone Encounter (Signed)
Please contact patient and let her know that her CT head is negative for stroke.  Chest x-ray is negative for pneumonia.  I suspect that she has a viral illness.  I would recommend that she start the Tessalon as needed for cough, drink plenty of fluids, and get plenty of rest.  If symptoms worsen, if fever greater than 101, or if she is not improved in 2 to 3 days please let us know.

## 2018-07-09 NOTE — Progress Notes (Signed)
Subjective:    Patient ID: Jenna Campbell, female    DOB: [birth year], 68 y.o.   MRN: [id number]  HPI  Patient is a 68 yr old female who presents today with chief complaint of cough. Reports that cough began several days ago. Reports that her breathing is tighter. Having some sinus drainage. R ear discomfort.  Reports + sinus HA yesterday as well as some nasal drainage  Notes a "rawness in her chest."  + fatigue.   Review of Systems See HPI  Past Medical History:  Diagnosis Date  . Adjustment disorder with anxious mood 04/21/2015  . Allergic rhinitis due to pollen 07/23/2014  . Alopecia 10/30/2013  . Arthritis 04/24/2016  . Atrophic vaginitis 10/30/2013  . Basal cell carcinoma 04/21/2015  . Cancer ([place])    skin cancer  . Cervical radiculopathy 09/17/2015  . Change in bowel function 06/14/2016  . Chronic diarrhea 12/16/2015  . Cystocele, midline 10/30/2013  . Dermatitis 09/22/2014  . Diverticulosis of large intestine 10/30/2013  . Dyspareunia 07/23/2014  . Essential hypertension   . Family history of pheochromocytoma 02/14/2016  . GERD (gastroesophageal reflux disease)   . Herpes simplex 08/17/2016  . History of adenomatous polyp of colon 06/14/2016  . History of blood transfusion 1970  . Hypothyroidism 10/30/2013  . Hypotonic bladder 10/30/2013  . Lumbar paraspinal muscle spasm 04/21/2015  . Major depression, recurrent ([place]) 07/14/2013  . Malaise and fatigue 04/21/2015  . MCI (mild cognitive impairment) 08/18/2015  . Menopause 03/31/2014  . Migraine   . Mild persistent asthma 05/13/2015  . Neuropathy 09/22/2014  . Personality disorder ([place]) 10/19/2012  . Pharyngoesophageal dysphagia 04/21/2015  . Plantar fasciitis 01/20/2016  . Precordial chest pain 08/18/2015  . Prolapse of vaginal vault after hysterectomy 10/30/2013  . Rectocele 10/30/2013  . Sinusitis, chronic 10/30/2013  . Slow transit constipation 07/23/2014  . Swelling of lower limb 05/17/2016  . Tension-type headache, not intractable 09/17/2015  .  Urethral stricture 10/30/2013  . Urinary incontinence without sensory awareness 07/17/2016  . Varicose veins of both lower extremities 04/24/2016  . Venous insufficiency (chronic) (peripheral) 06/06/2016     Social History   Socioeconomic History  . Marital status: Divorced    Spouse name: Not on file  . Number of children: Not on file  . Years of education: Not on file  . Highest education level: Not on file  Occupational History  . Not on file  Social Needs  . Financial resource strain: Not on file  . Food insecurity:    Worry: Not on file    Inability: Not on file  . Transportation needs:    Medical: Not on file    Non-medical: Not on file  Tobacco Use  . Smoking status: Never Smoker  . Smokeless tobacco: Never Used  Substance and Sexual Activity  . Alcohol use: Yes  . Drug use: No  . Sexual activity: Never    Partners: Male  Lifestyle  . Physical activity:    Days per week: Not on file    Minutes per session: Not on file  . Stress: Not on file  Relationships  . Social connections:    Talks on phone: Not on file    Gets together: Not on file    Attends religious service: Not on file    Active member of club or organization: Not on file    Attends meetings of clubs or organizations: Not on file    Relationship status: Not on file  . Intimate partner  violence:    Fear of current or ex partner: Not on file    Emotionally abused: Not on file    Physically abused: Not on file    Forced sexual activity: Not on file  Other Topics Concern  . Not on file  Social History Narrative  . Not on file    Past Surgical History:  Procedure Laterality Date  . BREAST CYST ASPIRATION Left    25 years ago   . MOHS SURGERY  10/05/2015  . RECTOCELE REPAIR  2011  . SKIN CANCER EXCISION  02/2015   Squamous cell removed from back  . TONSILLECTOMY    . VAGINAL HYSTERECTOMY    . VAGINAL PROLAPSE REPAIR      Family History  Problem Relation Age of Onset  . Asthma Father   .  Angina Father   . Emphysema Father   . Alzheimer's disease Mother   . Cancer Sister        died of cancer    Allergies  Allergen Reactions  . Iodinated Diagnostic Agents Itching, Other (See Comments), Rash and Shortness Of Breath    Throat Swelling, Erythema  . Kenalog [Triamcinolone Acetonide] Swelling  . Metrizamide Itching, Other (See Comments), Rash and Shortness Of Breath    Throat Swelling, Erythema  . Other Itching and Swelling    DUST  . Montelukast Sodium Other (See Comments)    Chest pain, dizziness and lightheadedness  . Penicillins   . Ciprofloxacin Itching  . Diazepam Rash  . Sulfa Antibiotics Rash    Current Outpatient Medications on File Prior to Visit  Medication Sig Dispense Refill  . albuterol (PROAIR HFA) 108 (90 Base) MCG/ACT inhaler Inhale 2 puffs into the lungs every 4 (four) hours as needed for wheezing or shortness of breath. 1 Inhaler 3  . albuterol (PROVENTIL) (2.5 MG/3ML) 0.083% nebulizer solution Take 3 mLs (2.5 mg total) by nebulization every 6 (six) hours as needed for wheezing or shortness of breath. 75 mL 0  . amLODipine (NORVASC) 5 MG tablet Take 1 tablet (5 mg total) by mouth daily. 30 tablet 0  . azelastine (ASTELIN) 0.1 % nasal spray Place 2 sprays into both nostrils 2 (two) times daily. Use in each nostril as directed 30 mL 12  . cefdinir (OMNICEF) 300 MG capsule Take 1 capsule (300 mg total) by mouth 2 (two) times daily. 20 capsule 0  . fluticasone (FLONASE) 50 MCG/ACT nasal spray Place 1 spray into both nostrils daily.    . fluticasone (FLOVENT HFA) 110 MCG/ACT inhaler Inhale 2 puffs into the lungs 2 (two) times daily. 1 Inhaler 12  . gabapentin (NEURONTIN) 300 MG capsule Take 300 mg by mouth 2 (two) times daily.    Marland Kitchen levothyroxine (SYNTHROID, LEVOTHROID) 150 MCG tablet TAKE 1 AND 1/2 TABLETS (225 MCG TOTAL) BY MOUTH DAILY BEFORE BREAKFAST. 135 tablet 0  . LORazepam (ATIVAN) 0.5 MG tablet Take 0.5 mg by mouth every 8 (eight) hours as needed.      . Mometasone Furoate (ASMANEX HFA) 100 MCG/ACT AERO Inhale 1 puff into the lungs daily.    Marland Kitchen omeprazole (PRILOSEC) 40 MG capsule Take 40 mg by mouth daily.    . ondansetron (ZOFRAN ODT) 4 MG disintegrating tablet Take 1 tablet (4 mg total) by mouth every 8 (eight) hours as needed for nausea or vomiting. 20 tablet 0  . temazepam (RESTORIL) 30 MG capsule :take 1 cap at night for sleep (to replace 15mg  size)    . tiZANidine (ZANAFLEX) 2  MG tablet Take 1 tablet (2 mg total) by mouth every 8 (eight) hours as needed for muscle spasms. 30 tablet 0  . topiramate (TOPAMAX) 100 MG tablet Take 100 mg by mouth 2 (two) times daily.    . nitroGLYCERIN (NITROSTAT) 0.4 MG SL tablet Place 1 tablet (0.4 mg total) under the tongue every 5 (five) minutes as needed for chest pain. 25 tablet 11   No current facility-administered medications on file prior to visit.     BP 129/69 (BP Location: Left Arm, Patient Position: Sitting, Cuff Size: Small)   Pulse 70   Temp 98.3 F (36.8 C) (Oral)   Resp 16   Ht 5\' 6"  (1.676 m)   Wt 150 lb (68 kg)   SpO2 100%   BMI 24.21 kg/m       Objective:   Physical Exam Constitutional:      Appearance: She is well-developed.  HENT:     Right Ear: Tympanic membrane and ear canal normal.     Left Ear: Tympanic membrane and ear canal normal.     Mouth/Throat:     Pharynx: No oropharyngeal exudate or posterior oropharyngeal erythema.  Neck:     Musculoskeletal: Neck supple.     Thyroid: No thyromegaly.  Cardiovascular:     Rate and Rhythm: Normal rate and regular rhythm.     Heart sounds: Normal heart sounds. No murmur.  Pulmonary:     Effort: Pulmonary effort is normal. No respiratory distress.     Breath sounds: Normal breath sounds. No wheezing.  Skin:    General: Skin is warm and dry.  Neurological:     Mental Status: She is alert and oriented to person, place, and time.     Comments: Right sided facial droop, especially noticeable at corner of the right mouth.     Right UE hand grasp 4-5/5, LUE hand  grasp 5/5  Not using right arm/hand during interview but will move on cue   Psychiatric:        Behavior: Behavior normal.        Thought Content: Thought content normal.        Judgment: Judgment normal.           Assessment & Plan:  Facial asymmetry- reviewed her neurologists note and he noted + facial symmetry. I was concerned re: possibility of cva due to lack of RUE use and right facial droop. CT was performed and was negative for CVA. Moderate frontal atrophy is noted.  Viral uri with cough-cxr negative for pneumonia. flu swab is negative. Pt is advised as follows: You may use tessalon as needed for cough.  Call if symptoms worsen or if not improved in 3-4 days.

## 2018-07-15 ENCOUNTER — Telehealth: Payer: Self-pay | Admitting: Family Medicine

## 2018-07-15 NOTE — Telephone Encounter (Signed)
Copied from [place] [id number]. Topic: General - Inquiry >> Jul 15, 2018  2:04 PM Jenna Campbell E wrote: Reason for CRM:  Pt called to inquire about lab and image results/ please advise

## 2018-07-17 NOTE — Telephone Encounter (Signed)
Talked to patient and reminded her her ct and xray results were discussed with her last week. She seems to be confused and was not sure if she called because  of a rx or results. I also told her our records indicate she is active on my chart and she viewed all of her results. Results reviewed with her on the phone one more time.

## 2018-08-08 ENCOUNTER — Other Ambulatory Visit: Payer: Self-pay

## 2018-08-08 ENCOUNTER — Other Ambulatory Visit: Payer: Self-pay | Admitting: Family Medicine

## 2018-08-08 MED ORDER — OMEPRAZOLE 40 MG PO CPDR: 40.0000 mg | DELAYED_RELEASE_CAPSULE | Freq: Every day | ORAL | 1 refills | Status: DC

## 2018-08-08 MED ORDER — NITROGLYCERIN 0.4 MG SL SUBL: 0.4000 mg | SUBLINGUAL_TABLET | SUBLINGUAL | 3 refills | Status: DC | PRN

## 2018-08-09 ENCOUNTER — Other Ambulatory Visit: Payer: Self-pay

## 2018-08-09 ENCOUNTER — Ambulatory Visit (INDEPENDENT_AMBULATORY_CARE_PROVIDER_SITE_OTHER): Payer: PPO | Admitting: Family Medicine

## 2018-08-09 DIAGNOSIS — J301 Allergic rhinitis due to pollen: Secondary | ICD-10-CM | POA: Diagnosis not present

## 2018-08-09 MED ORDER — PREDNISONE 20 MG PO TABS: 40.0000 mg | ORAL_TABLET | Freq: Every day | ORAL | 0 refills | Status: AC

## 2018-08-09 MED ORDER — LEVOCETIRIZINE DIHYDROCHLORIDE 5 MG PO TABS: 5.0000 mg | ORAL_TABLET | Freq: Every evening | ORAL | 2 refills | Status: DC

## 2018-08-09 NOTE — Progress Notes (Signed)
Virtual Visit via Telephone Note  I connected with Jenna Campbell on 08/09/18 at  3:45 PM EDT by telephone and verified that I am speaking with the correct person using two identifiers.   I discussed the limitations, risks, security and privacy concerns of performing an evaluation and management service by telephone and the availability of in person appointments. I also discussed with the patient that there may be a patient responsible charge related to this service. The patient expressed understanding and agreed to proceed.   History of Present Illness: Both ears have been clogged up for past 3 d. Some pain. Runny and congestion also. No itchy eyes, they have been dry. Some cough and st also. Denies fevers, sick contacts, recent travel.    Observations/Objective: No conversational dyspnea. Age appropriate judgment and insight  Assessment and Plan: ETD- prednisone 40 mg/d for 5 days.  Allergic rhinitis- Change cetirizine to levocetirizine  Follow Up Instructions: PRN.   I discussed the assessment and treatment plan with the patient. The patient was provided an opportunity to ask questions and all were answered. The patient agreed with the plan and demonstrated an understanding of the instructions.   The patient was advised to call back or seek an in-person evaluation if the symptoms worsen or if the condition fails to improve as anticipated.  I provided 7 minutes of non-face-to-face time during this encounter.   [place], DO

## 2018-08-12 DIAGNOSIS — F332 Major depressive disorder, recurrent severe without psychotic features: Secondary | ICD-10-CM | POA: Diagnosis not present

## 2018-08-26 DIAGNOSIS — F332 Major depressive disorder, recurrent severe without psychotic features: Secondary | ICD-10-CM | POA: Diagnosis not present

## 2018-09-04 DIAGNOSIS — F4322 Adjustment disorder with anxiety: Secondary | ICD-10-CM | POA: Diagnosis not present

## 2018-09-04 DIAGNOSIS — F449 Dissociative and conversion disorder, unspecified: Secondary | ICD-10-CM | POA: Diagnosis not present

## 2018-09-04 DIAGNOSIS — F330 Major depressive disorder, recurrent, mild: Secondary | ICD-10-CM | POA: Diagnosis not present

## 2018-09-09 DIAGNOSIS — F332 Major depressive disorder, recurrent severe without psychotic features: Secondary | ICD-10-CM | POA: Diagnosis not present

## 2018-09-17 ENCOUNTER — Ambulatory Visit (INDEPENDENT_AMBULATORY_CARE_PROVIDER_SITE_OTHER): Payer: PPO | Admitting: Family Medicine

## 2018-09-17 ENCOUNTER — Other Ambulatory Visit: Payer: Self-pay

## 2018-09-17 ENCOUNTER — Encounter: Payer: Self-pay | Admitting: Family Medicine

## 2018-09-17 DIAGNOSIS — J301 Allergic rhinitis due to pollen: Secondary | ICD-10-CM | POA: Diagnosis not present

## 2018-09-17 DIAGNOSIS — R062 Wheezing: Secondary | ICD-10-CM | POA: Diagnosis not present

## 2018-09-17 DIAGNOSIS — L989 Disorder of the skin and subcutaneous tissue, unspecified: Secondary | ICD-10-CM

## 2018-09-17 DIAGNOSIS — R030 Elevated blood-pressure reading, without diagnosis of hypertension: Secondary | ICD-10-CM | POA: Diagnosis not present

## 2018-09-17 MED ORDER — PREDNISONE 20 MG PO TABS: 40.0000 mg | ORAL_TABLET | Freq: Every day | ORAL | 0 refills | Status: AC

## 2018-09-17 NOTE — Progress Notes (Signed)
Chief Complaint  Patient presents with  . Allergies    Subjective: Patient is a 68 y.o. female here for allergies. Due to COVID-19 pandemic, we are interacting via web portal for an electronic face-to-face visit. I verified patient's ID using 2 identifiers. Patient agreed to proceed with visit via this method. Patient is at home, I am at office. Patient and I are present for visit.   Over past several weeks, particularly in the last week, patient has had worsening congestion, coughing, wheezing, ST, ear pressure/pain, and itchy eyes. She has been compliant with Flonase and Xyzal. Denies fevers, chest pain, drainage from ears, sick contacts, recent travel, myalgias.  Has been checking BP at home, running in 150's over 80's at times. Compliant w Norvasc 5 mg/d. No change in diet.   Notes some skin lesions on arms and under chest. It started out as an itchy/burning pain and now feels like pea sized lumps on chest, arms are like lesions from scratching. No drainage or spreading redness. Noticed over past several days. Denies any new topicals.  ROS: Const: No fevers Lungs: +cough   Past Medical History:  Diagnosis Date  . Adjustment disorder with anxious mood 04/21/2015  . Allergic rhinitis due to pollen 07/23/2014  . Alopecia 10/30/2013  . Arthritis 04/24/2016  . Atrophic vaginitis 10/30/2013  . Basal cell carcinoma 04/21/2015  . Cancer ([place])    skin cancer  . Cervical radiculopathy 09/17/2015  . Change in bowel function 06/14/2016  . Chronic diarrhea 12/16/2015  . Cystocele, midline 10/30/2013  . Dermatitis 09/22/2014  . Diverticulosis of large intestine 10/30/2013  . Dyspareunia 07/23/2014  . Essential hypertension   . Family history of pheochromocytoma 02/14/2016  . GERD (gastroesophageal reflux disease)   . Herpes simplex 08/17/2016  . History of adenomatous polyp of colon 06/14/2016  . History of blood transfusion 1970  . Hypothyroidism 10/30/2013  . Hypotonic bladder 10/30/2013  . Lumbar  paraspinal muscle spasm 04/21/2015  . Major depression, recurrent ([place]) 07/14/2013  . Malaise and fatigue 04/21/2015  . MCI (mild cognitive impairment) 08/18/2015  . Menopause 03/31/2014  . Migraine   . Mild persistent asthma 05/13/2015  . Neuropathy 09/22/2014  . Personality disorder ([place]) 10/19/2012  . Pharyngoesophageal dysphagia 04/21/2015  . Plantar fasciitis 01/20/2016  . Precordial chest pain 08/18/2015  . Prolapse of vaginal vault after hysterectomy 10/30/2013  . Rectocele 10/30/2013  . Sinusitis, chronic 10/30/2013  . Slow transit constipation 07/23/2014  . Swelling of lower limb 05/17/2016  . Tension-type headache, not intractable 09/17/2015  . Urethral stricture 10/30/2013  . Urinary incontinence without sensory awareness 07/17/2016  . Varicose veins of both lower extremities 04/24/2016  . Venous insufficiency (chronic) (peripheral) 06/06/2016    Objective: No conversational dyspnea Age appropriate judgment and insight Nml affect and mood  Assessment and Plan: Seasonal allergic rhinitis due to pollen - Plan: predniSONE (DELTASONE) 20 MG tablet  Wheezing - Plan: predniSONE (DELTASONE) 20 MG tablet  Elevated blood pressure reading  Skin lesion  1- Pred burst; cont INCS and Xyzal. May need to change the latter.  2- as above 3- monitor at home, check 2x waiting 1 min in b/t. Counseled on diet and exercise. 4- TAO over excoriated lesions bid until I can see her. F/u in person in 8 d.  The patient voiced understanding and agreement to the plan.  [place], DO 09/17/18  8:40 AM

## 2018-09-25 ENCOUNTER — Other Ambulatory Visit: Payer: Self-pay

## 2018-09-25 ENCOUNTER — Encounter: Payer: Self-pay | Admitting: Family Medicine

## 2018-09-25 ENCOUNTER — Ambulatory Visit (INDEPENDENT_AMBULATORY_CARE_PROVIDER_SITE_OTHER): Payer: PPO | Admitting: Family Medicine

## 2018-09-25 DIAGNOSIS — T7840XD Allergy, unspecified, subsequent encounter: Secondary | ICD-10-CM | POA: Diagnosis not present

## 2018-09-25 DIAGNOSIS — R030 Elevated blood-pressure reading, without diagnosis of hypertension: Secondary | ICD-10-CM

## 2018-09-25 DIAGNOSIS — J4540 Moderate persistent asthma, uncomplicated: Secondary | ICD-10-CM | POA: Diagnosis not present

## 2018-09-25 MED ORDER — FLUTICASONE-SALMETEROL 250-50 MCG/DOSE IN AEPB: 1.0000 | INHALATION_SPRAY | Freq: Two times a day (BID) | RESPIRATORY_TRACT | 2 refills | Status: DC

## 2018-09-25 MED ORDER — AZELASTINE HCL 0.1 % NA SOLN: 2.0000 | Freq: Two times a day (BID) | NASAL | 12 refills | Status: DC

## 2018-09-25 NOTE — Progress Notes (Signed)
Chief Complaint  Patient presents with  . Sinus Problem  . Cough    Subjective: Patient is a 68 y.o. female here for allergies. Due to COVID-19 pandemic, we are interacting via web portal for an electronic face-to-face visit. I verified patient's ID using 2 identifiers. Patient agreed to proceed with visit via this method. Patient is at home, I am at office. Patient and I are present for visit.   Cont to have issues w allergies. Compliant with INCS and Xyzal. Had reaction to montelukast in past. Also has asthma that is bothering her. Feels like she is still constantly out of breath. S/s's including runny nose, PND, cough, irritated throat, itchy eyes, ear fullness, wheezing, sob. Has been using Flovent for maintenance.  Has been checking BP at home. 130's, much better.    ROS: Const: No fevers Lungs: + SOB   Past Medical History:  Diagnosis Date  . Adjustment disorder with anxious mood 04/21/2015  . Allergic rhinitis due to pollen 07/23/2014  . Alopecia 10/30/2013  . Arthritis 04/24/2016  . Atrophic vaginitis 10/30/2013  . Basal cell carcinoma 04/21/2015  . Cancer ([place])    skin cancer  . Cervical radiculopathy 09/17/2015  . Change in bowel function 06/14/2016  . Chronic diarrhea 12/16/2015  . Cystocele, midline 10/30/2013  . Dermatitis 09/22/2014  . Diverticulosis of large intestine 10/30/2013  . Dyspareunia 07/23/2014  . Essential hypertension   . Family history of pheochromocytoma 02/14/2016  . GERD (gastroesophageal reflux disease)   . Herpes simplex 08/17/2016  . History of adenomatous polyp of colon 06/14/2016  . History of blood transfusion 1970  . Hypothyroidism 10/30/2013  . Hypotonic bladder 10/30/2013  . Lumbar paraspinal muscle spasm 04/21/2015  . Major depression, recurrent ([place]) 07/14/2013  . Malaise and fatigue 04/21/2015  . MCI (mild cognitive impairment) 08/18/2015  . Menopause 03/31/2014  . Migraine   . Mild persistent asthma 05/13/2015  . Neuropathy 09/22/2014  . Personality  disorder ([place]) 10/19/2012  . Pharyngoesophageal dysphagia 04/21/2015  . Plantar fasciitis 01/20/2016  . Precordial chest pain 08/18/2015  . Prolapse of vaginal vault after hysterectomy 10/30/2013  . Rectocele 10/30/2013  . Sinusitis, chronic 10/30/2013  . Slow transit constipation 07/23/2014  . Swelling of lower limb 05/17/2016  . Tension-type headache, not intractable 09/17/2015  . Urethral stricture 10/30/2013  . Urinary incontinence without sensory awareness 07/17/2016  . Varicose veins of both lower extremities 04/24/2016  . Venous insufficiency (chronic) (peripheral) 06/06/2016    Objective: No conversational dyspnea Age appropriate judgment and insight Nml affect and mood  Assessment and Plan: Moderate persistent asthma without complication - Plan: Fluticasone-Salmeterol (ADVAIR) 250-50 MCG/DOSE AEPB  Allergic state, subsequent encounter - Plan: azelastine (ASTELIN) 0.1 % nasal spray  Elevated blood pressure reading  1- Add LABA to ICS.  2- Add nasal antihistamine.  3- resolved, no med adjustment moving forward.  F/u in 2 weeks to reck. If no improvement, will refer to allergy/asthma team.  The patient voiced understanding and agreement to the plan.  [place], DO 09/25/18  9:35 AM

## 2018-09-26 ENCOUNTER — Telehealth: Payer: Self-pay | Admitting: Family Medicine

## 2018-09-26 NOTE — Telephone Encounter (Signed)
Copied from [place] [id number]. Topic: Quick Communication - Rx Refill/Question >> Sep 26, 2018  1:31 PM Oneta Rack wrote: Osvaldo Human name: Antionette  Relation to pt: Pharmacist with [facility] Call back number: [phone]    Reason for call:    Pharmacist in ned of clarity regarding new medication  Fluticasone-Salmeterol (ADVAIR) 250-50 MCG/DOSE AEPB, pharmacy would like to know patient was previously on flovent and would like to know if PCP would like her to take both, please advise

## 2018-09-27 ENCOUNTER — Telehealth: Payer: Self-pay | Admitting: Family Medicine

## 2018-09-27 NOTE — Telephone Encounter (Signed)
As shown in her medicine list and in my note, we have stopped the Flovent and transitioned to Advair.

## 2018-09-27 NOTE — Telephone Encounter (Signed)
Pharmacist informed of PCP instructions.

## 2018-09-27 NOTE — Telephone Encounter (Signed)
No. Done w Flovent while on Advair. Ty.

## 2018-09-27 NOTE — Telephone Encounter (Signed)
Copied from [place] [id number]. Topic: Quick Communication - See Telephone Encounter >> Sep 27, 2018  9:47 AM Rutherford Nail, NT wrote: CRM for notification. See Telephone encounter for: 09/27/18. Ronalee Belts with [facility] calling and needs clarification on which inhaler the patient is to be using. States that their system shows that she is on Flovent and Advair. Please advise.  CB#: [phone]

## 2018-09-27 NOTE — Telephone Encounter (Signed)
Please advise 

## 2018-10-08 DIAGNOSIS — F332 Major depressive disorder, recurrent severe without psychotic features: Secondary | ICD-10-CM | POA: Diagnosis not present

## 2018-10-09 ENCOUNTER — Other Ambulatory Visit: Payer: Self-pay

## 2018-10-09 ENCOUNTER — Encounter: Payer: Self-pay | Admitting: Family Medicine

## 2018-10-09 ENCOUNTER — Ambulatory Visit (INDEPENDENT_AMBULATORY_CARE_PROVIDER_SITE_OTHER): Payer: PPO | Admitting: Family Medicine

## 2018-10-09 DIAGNOSIS — J3089 Other allergic rhinitis: Secondary | ICD-10-CM | POA: Diagnosis not present

## 2018-10-09 DIAGNOSIS — J4540 Moderate persistent asthma, uncomplicated: Secondary | ICD-10-CM

## 2018-10-09 MED ORDER — FLUTICASONE-SALMETEROL 250-50 MCG/DOSE IN AEPB: 1.0000 | INHALATION_SPRAY | Freq: Two times a day (BID) | RESPIRATORY_TRACT | 2 refills | Status: DC

## 2018-10-09 NOTE — Progress Notes (Signed)
Chief Complaint  Patient presents with  . Follow-up    Subjective: Patient is a 68 y.o. female here for asthma f/u. Due to COVID-19 pandemic, we are interacting via web portal for an electronic face-to-face visit. I verified patient's ID using 2 identifiers. Patient agreed to proceed with visit via this method. Patient is at home, I am at office. Patient and I are present for visit.   Pt here for 2 week f/u since starting Advair. Has done much better and notices she is both breathing better and sleeping better. She is rinsing her mouth out after usage. Cost has been a barrier if she is to continue this.   Allergies doing OK with add'n of nasal spray. Compliant, no AE's.    ROS: Lungs: Denies SOB   Past Medical History:  Diagnosis Date  . Adjustment disorder with anxious mood 04/21/2015  . Allergic rhinitis due to pollen 07/23/2014  . Alopecia 10/30/2013  . Arthritis 04/24/2016  . Atrophic vaginitis 10/30/2013  . Basal cell carcinoma 04/21/2015  . Cancer ([place])    skin cancer  . Cervical radiculopathy 09/17/2015  . Change in bowel function 06/14/2016  . Chronic diarrhea 12/16/2015  . Cystocele, midline 10/30/2013  . Dermatitis 09/22/2014  . Diverticulosis of large intestine 10/30/2013  . Dyspareunia 07/23/2014  . Essential hypertension   . Family history of pheochromocytoma 02/14/2016  . GERD (gastroesophageal reflux disease)   . Herpes simplex 08/17/2016  . History of adenomatous polyp of colon 06/14/2016  . History of blood transfusion 1970  . Hypothyroidism 10/30/2013  . Hypotonic bladder 10/30/2013  . Lumbar paraspinal muscle spasm 04/21/2015  . Major depression, recurrent ([place]) 07/14/2013  . Malaise and fatigue 04/21/2015  . MCI (mild cognitive impairment) 08/18/2015  . Menopause 03/31/2014  . Migraine   . Mild persistent asthma 05/13/2015  . Neuropathy 09/22/2014  . Personality disorder ([place]) 10/19/2012  . Pharyngoesophageal dysphagia 04/21/2015  . Plantar fasciitis 01/20/2016  . Precordial  chest pain 08/18/2015  . Prolapse of vaginal vault after hysterectomy 10/30/2013  . Rectocele 10/30/2013  . Sinusitis, chronic 10/30/2013  . Slow transit constipation 07/23/2014  . Swelling of lower limb 05/17/2016  . Tension-type headache, not intractable 09/17/2015  . Urethral stricture 10/30/2013  . Urinary incontinence without sensory awareness 07/17/2016  . Varicose veins of both lower extremities 04/24/2016  . Venous insufficiency (chronic) (peripheral) 06/06/2016    Objective: No conversational dyspnea Age appropriate judgment and insight Nml affect and mood  Assessment and Plan: Moderate persistent asthma without complication - Plan: Fluticasone-Salmeterol (ADVAIR) 250-50 MCG/DOSE AEPB  Other allergic rhinitis  1- will try to reorder generic and state GENERIC for pharmacy. If still too expensive and GoodRx not helpful, she will let us know and we will refer to asthma specialists. 2- Cont management. F/u pending if we refer her or not. The patient voiced understanding and agreement to the plan.  [place], DO 10/09/18  1:40 PM

## 2018-10-22 DIAGNOSIS — M542 Cervicalgia: Secondary | ICD-10-CM | POA: Diagnosis not present

## 2018-10-22 DIAGNOSIS — M545 Low back pain: Secondary | ICD-10-CM | POA: Diagnosis not present

## 2018-10-22 DIAGNOSIS — M7581 Other shoulder lesions, right shoulder: Secondary | ICD-10-CM | POA: Diagnosis not present

## 2018-10-22 DIAGNOSIS — G44209 Tension-type headache, unspecified, not intractable: Secondary | ICD-10-CM | POA: Diagnosis not present

## 2018-10-28 DIAGNOSIS — F332 Major depressive disorder, recurrent severe without psychotic features: Secondary | ICD-10-CM | POA: Diagnosis not present

## 2018-10-31 ENCOUNTER — Other Ambulatory Visit: Payer: Self-pay | Admitting: Family Medicine

## 2018-10-31 DIAGNOSIS — F449 Dissociative and conversion disorder, unspecified: Secondary | ICD-10-CM | POA: Diagnosis not present

## 2018-10-31 DIAGNOSIS — F4322 Adjustment disorder with anxiety: Secondary | ICD-10-CM | POA: Diagnosis not present

## 2018-10-31 DIAGNOSIS — F330 Major depressive disorder, recurrent, mild: Secondary | ICD-10-CM | POA: Diagnosis not present

## 2018-12-03 DIAGNOSIS — M5020 Other cervical disc displacement, unspecified cervical region: Secondary | ICD-10-CM | POA: Diagnosis not present

## 2018-12-03 DIAGNOSIS — M7581 Other shoulder lesions, right shoulder: Secondary | ICD-10-CM | POA: Diagnosis not present

## 2018-12-03 DIAGNOSIS — M542 Cervicalgia: Secondary | ICD-10-CM | POA: Diagnosis not present

## 2018-12-03 DIAGNOSIS — M545 Low back pain: Secondary | ICD-10-CM | POA: Diagnosis not present

## 2018-12-03 DIAGNOSIS — G44209 Tension-type headache, unspecified, not intractable: Secondary | ICD-10-CM | POA: Diagnosis not present

## 2018-12-16 ENCOUNTER — Other Ambulatory Visit: Payer: Self-pay

## 2018-12-16 DIAGNOSIS — F332 Major depressive disorder, recurrent severe without psychotic features: Secondary | ICD-10-CM | POA: Diagnosis not present

## 2018-12-31 DIAGNOSIS — E119 Type 2 diabetes mellitus without complications: Secondary | ICD-10-CM | POA: Diagnosis not present

## 2019-01-07 DIAGNOSIS — F332 Major depressive disorder, recurrent severe without psychotic features: Secondary | ICD-10-CM | POA: Diagnosis not present

## 2019-01-12 ENCOUNTER — Encounter: Payer: Self-pay | Admitting: Family Medicine

## 2019-01-13 ENCOUNTER — Ambulatory Visit (INDEPENDENT_AMBULATORY_CARE_PROVIDER_SITE_OTHER): Payer: PPO | Admitting: Family Medicine

## 2019-01-13 ENCOUNTER — Other Ambulatory Visit: Payer: Self-pay

## 2019-01-13 ENCOUNTER — Encounter: Payer: Self-pay | Admitting: Family Medicine

## 2019-01-13 DIAGNOSIS — K0889 Other specified disorders of teeth and supporting structures: Secondary | ICD-10-CM

## 2019-01-13 DIAGNOSIS — R110 Nausea: Secondary | ICD-10-CM

## 2019-01-13 MED ORDER — FLUCONAZOLE 150 MG PO TABS: 150.0000 mg | ORAL_TABLET | Freq: Once | ORAL | 0 refills | Status: DC

## 2019-01-13 MED ORDER — AMOXICILLIN 500 MG PO CAPS: 500.0000 mg | ORAL_CAPSULE | Freq: Two times a day (BID) | ORAL | 0 refills | Status: AC

## 2019-01-13 MED ORDER — NAPROXEN 500 MG PO TBEC: 500.0000 mg | DELAYED_RELEASE_TABLET | Freq: Two times a day (BID) | ORAL | 0 refills | Status: DC

## 2019-01-13 MED ORDER — ONDANSETRON 4 MG PO TBDP: 4.0000 mg | ORAL_TABLET | Freq: Three times a day (TID) | ORAL | 0 refills | Status: DC | PRN

## 2019-01-13 NOTE — Progress Notes (Signed)
Chief Complaint  Patient presents with  . Dental Pain    Subjective: Patient is a 68 y.o. female here for jaw pain. Due to COVID-19 pandemic, we are interacting via web portal for an electronic face-to-face visit. I verified patient's ID using 2 identifiers. Patient agreed to proceed with visit via this method. Patient is at home, I am at office. Patient and I are present for visit.   7 d ago, started having jaw pain, ear pain, and nausea. She has a cracked tooth from decay on the bottom R molar. She is in process of finding a dentist. Nausea is quite bad. No strange tastes or drainage in mouth. She has dentures anchored to a tooth that might need to be removed. No fevers, injury.   ROS: Const: No fevers  Past Medical History:  Diagnosis Date  . Adjustment disorder with anxious mood 04/21/2015  . Allergic rhinitis due to pollen 07/23/2014  . Alopecia 10/30/2013  . Arthritis 04/24/2016  . Atrophic vaginitis 10/30/2013  . Basal cell carcinoma 04/21/2015  . Cancer ([place])    skin cancer  . Cervical radiculopathy 09/17/2015  . Change in bowel function 06/14/2016  . Chronic diarrhea 12/16/2015  . Cystocele, midline 10/30/2013  . Dermatitis 09/22/2014  . Diverticulosis of large intestine 10/30/2013  . Dyspareunia 07/23/2014  . Essential hypertension   . Family history of pheochromocytoma 02/14/2016  . GERD (gastroesophageal reflux disease)   . Herpes simplex 08/17/2016  . History of adenomatous polyp of colon 06/14/2016  . History of blood transfusion 1970  . Hypothyroidism 10/30/2013  . Hypotonic bladder 10/30/2013  . Lumbar paraspinal muscle spasm 04/21/2015  . Major depression, recurrent ([place]) 07/14/2013  . Malaise and fatigue 04/21/2015  . MCI (mild cognitive impairment) 08/18/2015  . Menopause 03/31/2014  . Migraine   . Mild persistent asthma 05/13/2015  . Neuropathy 09/22/2014  . Personality disorder ([place]) 10/19/2012  . Pharyngoesophageal dysphagia 04/21/2015  . Plantar fasciitis 01/20/2016  .  Precordial chest pain 08/18/2015  . Prolapse of vaginal vault after hysterectomy 10/30/2013  . Rectocele 10/30/2013  . Sinusitis, chronic 10/30/2013  . Slow transit constipation 07/23/2014  . Swelling of lower limb 05/17/2016  . Tension-type headache, not intractable 09/17/2015  . Urethral stricture 10/30/2013  . Urinary incontinence without sensory awareness 07/17/2016  . Varicose veins of both lower extremities 04/24/2016  . Venous insufficiency (chronic) (peripheral) 06/06/2016    Objective: No conversational dyspnea Age appropriate judgment and insight Nml affect and mood  Assessment and Plan: Dentalgia - Plan: amoxicillin (AMOXIL) 500 MG capsule, naproxen (EC NAPROSYN) 500 MG EC tablet, fluconazole (DIFLUCAN) 150 MG tablet  Nausea - Plan: ondansetron (ZOFRAN-ODT) 4 MG disintegrating tablet  Does OK w oral amoxicillin, not PCN. Diflucan if she gets yeast infection.  F/u prn. Knows she needs to find a dentist.  The patient voiced understanding and agreement to the plan.  [place], DO 01/13/19  2:29 PM

## 2019-01-29 ENCOUNTER — Other Ambulatory Visit: Payer: Self-pay | Admitting: Family Medicine

## 2019-01-29 DIAGNOSIS — K0889 Other specified disorders of teeth and supporting structures: Secondary | ICD-10-CM

## 2019-01-30 ENCOUNTER — Other Ambulatory Visit: Payer: Self-pay | Admitting: Family Medicine

## 2019-02-05 DIAGNOSIS — F332 Major depressive disorder, recurrent severe without psychotic features: Secondary | ICD-10-CM | POA: Diagnosis not present

## 2019-02-11 ENCOUNTER — Other Ambulatory Visit: Payer: Self-pay | Admitting: Family Medicine

## 2019-02-11 DIAGNOSIS — J4540 Moderate persistent asthma, uncomplicated: Secondary | ICD-10-CM

## 2019-02-13 ENCOUNTER — Telehealth: Payer: Self-pay

## 2019-02-13 ENCOUNTER — Other Ambulatory Visit: Payer: Self-pay | Admitting: Family Medicine

## 2019-02-13 NOTE — Telephone Encounter (Signed)
Copied from [place] [id number]. Topic: General - Other >> Feb 13, 2019  1:25 PM Carolyn Stare wrote: Pt is asking for a RX FOR VALACYCLOVIR, she said was given to her by another doctor but Dr Nani Ravens is aware  [facility]

## 2019-02-14 ENCOUNTER — Other Ambulatory Visit: Payer: Self-pay

## 2019-02-14 ENCOUNTER — Other Ambulatory Visit: Payer: Self-pay | Admitting: Family Medicine

## 2019-02-14 ENCOUNTER — Ambulatory Visit (INDEPENDENT_AMBULATORY_CARE_PROVIDER_SITE_OTHER): Payer: PPO | Admitting: Family Medicine

## 2019-02-14 ENCOUNTER — Encounter: Payer: Self-pay | Admitting: Family Medicine

## 2019-02-14 DIAGNOSIS — B3731 Acute candidiasis of vulva and vagina: Secondary | ICD-10-CM

## 2019-02-14 DIAGNOSIS — J302 Other seasonal allergic rhinitis: Secondary | ICD-10-CM | POA: Diagnosis not present

## 2019-02-14 DIAGNOSIS — B009 Herpesviral infection, unspecified: Secondary | ICD-10-CM | POA: Diagnosis not present

## 2019-02-14 DIAGNOSIS — K0889 Other specified disorders of teeth and supporting structures: Secondary | ICD-10-CM

## 2019-02-14 DIAGNOSIS — B373 Candidiasis of vulva and vagina: Secondary | ICD-10-CM | POA: Diagnosis not present

## 2019-02-14 MED ORDER — FLUCONAZOLE 150 MG PO TABS: 150.0000 mg | ORAL_TABLET | Freq: Once | ORAL | 0 refills | Status: AC

## 2019-02-14 MED ORDER — VALACYCLOVIR HCL 1 G PO TABS: ORAL_TABLET | ORAL | 2 refills | Status: DC

## 2019-02-14 NOTE — Progress Notes (Signed)
CC: Yeast infection  Subjective: Patient is a 68 y.o. female here for dental pain. Due to COVID-19 pandemic, we are interacting via web portal for an electronic face-to-face visit. I verified patient's ID using 2 identifiers. Patient agreed to proceed with visit via this method. Patient is at home, I am at office. Patient and I are present for visit.   Yeast infection after 2 rounds of abx for dental issues, starting a few days ago. No fevers or urinary issues. Itching, slight d/c. Has had before.  Hx of herpes, takes valtrex 1 g/d for 5 d for flares. Had one recently and now needs a refill.   Has allergies, has not been using any medications other than nasal spray. Had been on Xyzal in past.    ROS: GU: +itching Const: No fevers  Past Medical History:  Diagnosis Date  . Adjustment disorder with anxious mood 04/21/2015  . Allergic rhinitis due to pollen 07/23/2014  . Alopecia 10/30/2013  . Arthritis 04/24/2016  . Atrophic vaginitis 10/30/2013  . Basal cell carcinoma 04/21/2015  . Cancer ([place])    skin cancer  . Cervical radiculopathy 09/17/2015  . Change in bowel function 06/14/2016  . Chronic diarrhea 12/16/2015  . Cystocele, midline 10/30/2013  . Dermatitis 09/22/2014  . Diverticulosis of large intestine 10/30/2013  . Dyspareunia 07/23/2014  . Essential hypertension   . Family history of pheochromocytoma 02/14/2016  . GERD (gastroesophageal reflux disease)   . Herpes simplex 08/17/2016  . History of adenomatous polyp of colon 06/14/2016  . History of blood transfusion 1970  . Hypothyroidism 10/30/2013  . Hypotonic bladder 10/30/2013  . Lumbar paraspinal muscle spasm 04/21/2015  . Major depression, recurrent ([place]) 07/14/2013  . Malaise and fatigue 04/21/2015  . MCI (mild cognitive impairment) 08/18/2015  . Menopause 03/31/2014  . Migraine   . Mild persistent asthma 05/13/2015  . Neuropathy 09/22/2014  . Personality disorder ([place]) 10/19/2012  . Pharyngoesophageal dysphagia 04/21/2015  . Plantar  fasciitis 01/20/2016  . Precordial chest pain 08/18/2015  . Prolapse of vaginal vault after hysterectomy 10/30/2013  . Rectocele 10/30/2013  . Sinusitis, chronic 10/30/2013  . Slow transit constipation 07/23/2014  . Swelling of lower limb 05/17/2016  . Tension-type headache, not intractable 09/17/2015  . Urethral stricture 10/30/2013  . Urinary incontinence without sensory awareness 07/17/2016  . Varicose veins of both lower extremities 04/24/2016  . Venous insufficiency (chronic) (peripheral) 06/06/2016    Objective: No conversational dyspnea Age appropriate judgment and insight Nml affect and mood  Assessment and Plan: Yeast vaginitis - Plan: fluconazole (DIFLUCAN) 150 MG tablet  Herpes simplex - Plan: valACYclovir (VALTREX) 1000 MG tablet  Seasonal allergies  1- Diflucan 2- Refilled 3- Cetirizine, cont INCS. F/u prn for these.  The patient voiced understanding and agreement to the plan.  [place], DO 02/14/19  12:45 PM

## 2019-03-05 ENCOUNTER — Telehealth: Payer: Self-pay | Admitting: *Deleted

## 2019-03-05 DIAGNOSIS — F332 Major depressive disorder, recurrent severe without psychotic features: Secondary | ICD-10-CM | POA: Diagnosis not present

## 2019-03-05 NOTE — Telephone Encounter (Signed)
Copied from [place] [id number]. Topic: General - Other >> Mar 05, 2019 12:30 PM Rainey Pines A wrote: Patient would like a callback in regards to getting covid test done. She has been around someone who appears to have symptoms for covid.

## 2019-03-06 NOTE — Telephone Encounter (Signed)
See if we can put her in at 815 or at end of morning of 10/23. Ty.

## 2019-03-06 NOTE — Telephone Encounter (Signed)
Are you ok with just ordering or would you like to see her?

## 2019-03-06 NOTE — Progress Notes (Deleted)
Subjective:   Jenna Campbell is a 68 y.o. female who presents for Medicare Annual (Subsequent) preventive examination.  Review of Systems:    Home Safety/Smoke Alarms: Feels safe in home. Smoke alarms in place.  Lives w/her boyfriend (Ron).  Female:        Mammo-       Dexa scan-        CCS-    Objective:     Vitals: There were no vitals taken for this visit.  There is no height or weight on file to calculate BMI.  Advanced Directives 11/28/2017  Does Patient Have a Medical Advance Directive? No  Would patient like information on creating a medical advance directive? No - Patient declined    Tobacco Social History   Tobacco Use  Smoking Status Never Smoker  Smokeless Tobacco Never Used     Counseling given: Not Answered   Clinical Intake:                       Past Medical History:  Diagnosis Date  . Adjustment disorder with anxious mood 04/21/2015  . Allergic rhinitis due to pollen 07/23/2014  . Alopecia 10/30/2013  . Arthritis 04/24/2016  . Atrophic vaginitis 10/30/2013  . Basal cell carcinoma 04/21/2015  . Cancer ([place])    skin cancer  . Cervical radiculopathy 09/17/2015  . Change in bowel function 06/14/2016  . Chronic diarrhea 12/16/2015  . Cystocele, midline 10/30/2013  . Dermatitis 09/22/2014  . Diverticulosis of large intestine 10/30/2013  . Dyspareunia 07/23/2014  . Essential hypertension   . Family history of pheochromocytoma 02/14/2016  . GERD (gastroesophageal reflux disease)   . Herpes simplex 08/17/2016  . History of adenomatous polyp of colon 06/14/2016  . History of blood transfusion 1970  . Hypothyroidism 10/30/2013  . Hypotonic bladder 10/30/2013  . Lumbar paraspinal muscle spasm 04/21/2015  . Major depression, recurrent ([place]) 07/14/2013  . Malaise and fatigue 04/21/2015  . MCI (mild cognitive impairment) 08/18/2015  . Menopause 03/31/2014  . Migraine   . Mild persistent asthma 05/13/2015  . Neuropathy 09/22/2014  . Personality disorder ([place])  10/19/2012  . Pharyngoesophageal dysphagia 04/21/2015  . Plantar fasciitis 01/20/2016  . Precordial chest pain 08/18/2015  . Prolapse of vaginal vault after hysterectomy 10/30/2013  . Rectocele 10/30/2013  . Sinusitis, chronic 10/30/2013  . Slow transit constipation 07/23/2014  . Swelling of lower limb 05/17/2016  . Tension-type headache, not intractable 09/17/2015  . Urethral stricture 10/30/2013  . Urinary incontinence without sensory awareness 07/17/2016  . Varicose veins of both lower extremities 04/24/2016  . Venous insufficiency (chronic) (peripheral) 06/06/2016   Past Surgical History:  Procedure Laterality Date  . BREAST CYST ASPIRATION Left    25 years ago   . MOHS SURGERY  10/05/2015  . RECTOCELE REPAIR  2011  . SKIN CANCER EXCISION  02/2015   Squamous cell removed from back  . TONSILLECTOMY    . VAGINAL HYSTERECTOMY    . VAGINAL PROLAPSE REPAIR     Family History  Problem Relation Age of Onset  . Asthma Father   . Angina Father   . Emphysema Father   . Alzheimer's disease Mother   . Cancer Sister        died of cancer   Social History   Socioeconomic History  . Marital status: Divorced    Spouse name: Not on file  . Number of children: Not on file  . Years of education: Not on file  .  Highest education level: Not on file  Occupational History  . Not on file  Social Needs  . Financial resource strain: Not on file  . Food insecurity    Worry: Not on file    Inability: Not on file  . Transportation needs    Medical: Not on file    Non-medical: Not on file  Tobacco Use  . Smoking status: Never Smoker  . Smokeless tobacco: Never Used  Substance and Sexual Activity  . Alcohol use: Yes  . Drug use: No  . Sexual activity: Never    Partners: Male  Lifestyle  . Physical activity    Days per week: Not on file    Minutes per session: Not on file  . Stress: Not on file  Relationships  . Social Herbalist on phone: Not on file    Gets together: Not on file     Attends religious service: Not on file    Active member of club or organization: Not on file    Attends meetings of clubs or organizations: Not on file    Relationship status: Not on file  Other Topics Concern  . Not on file  Social History Narrative  . Not on file    Outpatient Encounter Medications as of 03/07/2019  Medication Sig  . ADVAIR DISKUS 250-50 MCG/DOSE AEPB TAKE 1 PUFF BY MOUTH TWICE A DAY  . albuterol (PROAIR HFA) 108 (90 Base) MCG/ACT inhaler Inhale 2 puffs into the lungs every 4 (four) hours as needed for wheezing or shortness of breath.  Marland Kitchen albuterol (PROVENTIL) (2.5 MG/3ML) 0.083% nebulizer solution Take 3 mLs (2.5 mg total) by nebulization every 6 (six) hours as needed for wheezing or shortness of breath.  Marland Kitchen azelastine (ASTELIN) 0.1 % nasal spray Place 2 sprays into both nostrils 2 (two) times daily. Use in each nostril as directed  . betamethasone valerate (VALISONE) 0.1 % cream APPLY TO AFFECTED AREA TWICE A DAY  . fluticasone (FLONASE) 50 MCG/ACT nasal spray Place 1 spray into both nostrils daily.  Marland Kitchen gabapentin (NEURONTIN) 300 MG capsule Take 300 mg by mouth 2 (two) times daily.  Marland Kitchen levocetirizine (XYZAL) 5 MG tablet Take 1 tablet (5 mg total) by mouth every evening.  Marland Kitchen levothyroxine (SYNTHROID) 150 MCG tablet TAKE 1 & 1/2 TABLETS BY MOUTH DAILY BEFORE BREAKFAST  . LORazepam (ATIVAN) 0.5 MG tablet Take 0.5 mg by mouth every 8 (eight) hours as needed.   Marland Kitchen NAPROXEN DR 500 MG EC tablet TAKE 1 TABLET (500 MG TOTAL) BY MOUTH 2 (TWO) TIMES DAILY WITH A MEAL.  . nitroGLYCERIN (NITROSTAT) 0.4 MG SL tablet Place 1 tablet (0.4 mg total) under the tongue every 5 (five) minutes as needed for chest pain.  Marland Kitchen omeprazole (PRILOSEC) 40 MG capsule Take 1 capsule (40 mg total) by mouth daily.  . ondansetron (ZOFRAN-ODT) 4 MG disintegrating tablet Take 1 tablet (4 mg total) by mouth every 8 (eight) hours as needed for nausea or vomiting.  . temazepam (RESTORIL) 30 MG capsule :take 1 cap at  night for sleep (to replace 15mg  size)  . tiZANidine (ZANAFLEX) 2 MG tablet Take 1 tablet (2 mg total) by mouth every 8 (eight) hours as needed for muscle spasms.  Marland Kitchen topiramate (TOPAMAX) 100 MG tablet Take 100 mg by mouth 2 (two) times daily.  . valACYclovir (VALTREX) 1000 MG tablet Take 1 tab daily for 5 days during outbreaks.   No facility-administered encounter medications on file as of 03/07/2019.  Activities of Daily Living No flowsheet data found.  Patient Care Team: Shelda Pal, DO as PCP - General (Family Medicine)    Assessment:   This is a routine wellness examination for Willye. Physical assessment deferred to PCP.  Exercise Activities and Dietary recommendations   Diet (meal preparation, eat out, water intake, caffeinated beverages, dairy products, fruits and vegetables): {Desc; diets:16563} Breakfast: Lunch:  Dinner:      Goals    . Begin using siver sneakers.     Pt reports she joined [employer].        Fall Risk Fall Risk  07/05/2017  Falls in the past year? No    Depression Screen PHQ 2/9 Scores 07/05/2017  PHQ - 2 Score 0     Cognitive Function Ad8 score reviewed for issues:  Issues making decisions:  Less interest in hobbies / activities:  Repeats questions, stories (family complaining):  Trouble using ordinary gadgets (microwave, computer, phone):  Forgets the month or year:   Mismanaging finances:   Remembering appts:  Daily problems with thinking and/or memory: Ad8 score is=   MMSE - Mini Mental State Exam 07/05/2017  Orientation to time 5  Orientation to Place 5  Registration 3  Attention/ Calculation 5  Recall 3  Language- name 2 objects 2  Language- repeat 1  Language- follow 3 step command 3  Language- read & follow direction 1  Write a sentence 1  Copy design 1  Total score 30        Immunization History  Administered Date(s) Administered  . Influenza, High Dose Seasonal PF 03/01/2017  .  Influenza,inj,quad, With Preservative 02/24/2014  . Influenza-Unspecified 02/25/2013, 02/25/2013, 02/24/2014, 01/25/2015, 01/25/2015, 06/30/2016, 06/30/2016  . Pneumococcal Polysaccharide-23 12/22/2014    Screening Tests Health Maintenance  Topic Date Due  . Hepatitis C Screening  1951-03-12  . TETANUS/TDAP  02/06/1969  . DEXA SCAN  02/07/2015  . PNA vac Low Risk Adult (1 of 2 - PCV13) 12/22/2015  . INFLUENZA VACCINE  12/14/2018  . MAMMOGRAM  03/20/2019  . COLONOSCOPY  04/15/2019      Plan:   ***   I have personally reviewed and noted the following in the patient's chart:   . Medical and social history . Use of alcohol, tobacco or illicit drugs  . Current medications and supplements . Functional ability and status . Nutritional status . Physical activity . Advanced directives . List of other physicians . Hospitalizations, surgeries, and ER visits in previous 12 months . Vitals . Screenings to include cognitive, depression, and falls . Referrals and appointments  In addition, I have reviewed and discussed with patient certain preventive protocols, quality metrics, and best practice recommendations. A written personalized care plan for preventive services as well as general preventive health recommendations were provided to patient.     Shela Nevin, [place]  03/06/2019

## 2019-03-07 ENCOUNTER — Other Ambulatory Visit: Payer: Self-pay

## 2019-03-07 ENCOUNTER — Encounter: Payer: Self-pay | Admitting: Family Medicine

## 2019-03-07 ENCOUNTER — Ambulatory Visit (INDEPENDENT_AMBULATORY_CARE_PROVIDER_SITE_OTHER): Payer: PPO | Admitting: Family Medicine

## 2019-03-07 ENCOUNTER — Ambulatory Visit: Payer: Self-pay | Admitting: *Deleted

## 2019-03-07 ENCOUNTER — Other Ambulatory Visit: Payer: Self-pay | Admitting: Family Medicine

## 2019-03-07 ENCOUNTER — Ambulatory Visit: Payer: PPO | Admitting: Medical

## 2019-03-07 DIAGNOSIS — J029 Acute pharyngitis, unspecified: Secondary | ICD-10-CM | POA: Diagnosis not present

## 2019-03-07 DIAGNOSIS — N3000 Acute cystitis without hematuria: Secondary | ICD-10-CM

## 2019-03-07 DIAGNOSIS — B009 Herpesviral infection, unspecified: Secondary | ICD-10-CM | POA: Diagnosis not present

## 2019-03-07 MED ORDER — NITROFURANTOIN MONOHYD MACRO 100 MG PO CAPS: 100.0000 mg | ORAL_CAPSULE | Freq: Two times a day (BID) | ORAL | 0 refills | Status: AC

## 2019-03-07 MED ORDER — VALACYCLOVIR HCL 500 MG PO TABS: ORAL_TABLET | ORAL | 2 refills | Status: DC

## 2019-03-07 NOTE — Progress Notes (Signed)
Chief Complaint  Patient presents with  . Pelvic Pain  . Dysuria    Jenna Campbell is a 68 y.o. female here for possible UTI. Due to COVID-19 pandemic, we are interacting via web portal for an electronic face-to-face visit. I verified patient's ID using 2 identifiers. Patient agreed to proceed with visit via this method. Patient is in a parked car, I am at office. Patient and I are present for visit.   Duration: 4 days. Symptoms: Dysuria, pelvic pressure, urinary frequency and dysuria Denies: hematuria, urinary hesitancy, fever, nausea, vomiting and fevers, vaginal discharge Hx of recurrent UTI? No Denies new sexual partners.  Duration: a few days  Associated symptoms: sore throat and fatigue Denies: sinus congestion, sinus pain, rhinorrhea, itchy watery eyes, fevers,  ear pain, ear drainage, wheezing, shortness of breath, myalgia and vomiting, diarrhea Treatment to date: none Sick contacts: Yes  Hx of herpes, takes Valtrex 1 g qd for 5 d for outbreaks. Would like smaller caps.   ROS:  Constitutional: denies fever GU: As noted in HPI  Past Medical History:  Diagnosis Date  . Adjustment disorder with anxious mood 04/21/2015  . Allergic rhinitis due to pollen 07/23/2014  . Alopecia 10/30/2013  . Arthritis 04/24/2016  . Atrophic vaginitis 10/30/2013  . Basal cell carcinoma 04/21/2015  . Cancer ([place])    skin cancer  . Cervical radiculopathy 09/17/2015  . Change in bowel function 06/14/2016  . Chronic diarrhea 12/16/2015  . Cystocele, midline 10/30/2013  . Dermatitis 09/22/2014  . Diverticulosis of large intestine 10/30/2013  . Dyspareunia 07/23/2014  . Essential hypertension   . Family history of pheochromocytoma 02/14/2016  . GERD (gastroesophageal reflux disease)   . Herpes simplex 08/17/2016  . History of adenomatous polyp of colon 06/14/2016  . History of blood transfusion 1970  . Hypothyroidism 10/30/2013  . Hypotonic bladder 10/30/2013  . Lumbar paraspinal muscle spasm 04/21/2015  .  Major depression, recurrent ([place]) 07/14/2013  . Malaise and fatigue 04/21/2015  . MCI (mild cognitive impairment) 08/18/2015  . Menopause 03/31/2014  . Migraine   . Mild persistent asthma 05/13/2015  . Neuropathy 09/22/2014  . Personality disorder ([place]) 10/19/2012  . Pharyngoesophageal dysphagia 04/21/2015  . Plantar fasciitis 01/20/2016  . Precordial chest pain 08/18/2015  . Prolapse of vaginal vault after hysterectomy 10/30/2013  . Rectocele 10/30/2013  . Sinusitis, chronic 10/30/2013  . Slow transit constipation 07/23/2014  . Swelling of lower limb 05/17/2016  . Tension-type headache, not intractable 09/17/2015  . Urethral stricture 10/30/2013  . Urinary incontinence without sensory awareness 07/17/2016  . Varicose veins of both lower extremities 04/24/2016  . Venous insufficiency (chronic) (peripheral) 06/06/2016    Exam No conversational dyspnea Age appropriate judgment and insight Nml affect and mood  Acute cystitis without hematuria - Plan: nitrofurantoin, macrocrystal-monohydrate, (MACROBID) 100 MG capsule  Sore throat  Herpes simplex - Plan: valACYclovir (VALTREX) 500 MG tablet  Orders as above. Stay hydrated. Seek immediate care if pt starts to develop fevers, new/worsening symptoms, uncontrollable N/V. F/u prn. The patient voiced understanding and agreement to the plan.  [place], DO 03/07/19 2:22 PM

## 2019-03-07 NOTE — Addendum Note (Signed)
Addended by: Sharon Seller B on: 03/07/2019 02:31 PM   Modules accepted: Orders

## 2019-03-07 NOTE — Telephone Encounter (Signed)
Called the patient to offer her a 1:45 Virtual visit Left message to call back.

## 2019-03-07 NOTE — Progress Notes (Signed)
lab

## 2019-03-07 NOTE — Addendum Note (Signed)
Addended by: Sharon Seller B on: 03/07/2019 02:27 PM   Modules accepted: Orders

## 2019-03-12 ENCOUNTER — Other Ambulatory Visit: Payer: Self-pay

## 2019-03-12 DIAGNOSIS — Z20822 Contact with and (suspected) exposure to covid-19: Secondary | ICD-10-CM

## 2019-03-12 DIAGNOSIS — Z20828 Contact with and (suspected) exposure to other viral communicable diseases: Secondary | ICD-10-CM | POA: Diagnosis not present

## 2019-03-13 LAB — NOVEL CORONAVIRUS, NAA: SARS-CoV-2, NAA: NOT DETECTED

## 2019-04-09 ENCOUNTER — Other Ambulatory Visit: Payer: Self-pay

## 2019-04-14 DIAGNOSIS — F332 Major depressive disorder, recurrent severe without psychotic features: Secondary | ICD-10-CM | POA: Diagnosis not present

## 2019-05-14 ENCOUNTER — Other Ambulatory Visit: Payer: Self-pay

## 2019-05-14 ENCOUNTER — Encounter: Payer: Self-pay | Admitting: Family Medicine

## 2019-05-14 ENCOUNTER — Ambulatory Visit (INDEPENDENT_AMBULATORY_CARE_PROVIDER_SITE_OTHER): Payer: PPO | Admitting: Family Medicine

## 2019-05-14 DIAGNOSIS — J029 Acute pharyngitis, unspecified: Secondary | ICD-10-CM | POA: Diagnosis not present

## 2019-05-14 MED ORDER — FLUCONAZOLE 150 MG PO TABS: ORAL_TABLET | ORAL | 0 refills | Status: DC

## 2019-05-14 MED ORDER — PROMETHAZINE-DM 6.25-15 MG/5ML PO SYRP: 5.0000 mL | ORAL_SOLUTION | Freq: Four times a day (QID) | ORAL | 0 refills | Status: DC | PRN

## 2019-05-14 MED ORDER — AZITHROMYCIN 250 MG PO TABS: ORAL_TABLET | ORAL | 0 refills | Status: DC

## 2019-05-14 NOTE — Progress Notes (Signed)
Chief Complaint  Patient presents with  . Sore Throat    Jenna Campbell here for URI complaints. Due to COVID-19 pandemic, we are interacting via telephone. I verified patient's ID using 2 identifiers. Patient agreed to proceed with visit via this method. Patient is at home, I am at office. Patient and I are present for visit.   Duration: 2 days  Associated symptoms: sinus congestion, sinus pain, rhinorrhea, ear pain, sore throat and cough Denies: itchy watery eyes, ear drainage, wheezing, shortness of breath, myalgia and fevers, N/V/D Treatment to date: Tussin, Benadryl Sick contacts: Yes- sick contact w strep throat  ROS:  Const: Denies fevers HEENT: As noted in HPI Lungs: No SOB  Past Medical History:  Diagnosis Date  . Adjustment disorder with anxious mood 04/21/2015  . Allergic rhinitis due to pollen 07/23/2014  . Alopecia 10/30/2013  . Arthritis 04/24/2016  . Atrophic vaginitis 10/30/2013  . Basal cell carcinoma 04/21/2015  . Cancer ([place])    skin cancer  . Cervical radiculopathy 09/17/2015  . Change in bowel function 06/14/2016  . Chronic diarrhea 12/16/2015  . Cystocele, midline 10/30/2013  . Dermatitis 09/22/2014  . Diverticulosis of large intestine 10/30/2013  . Dyspareunia 07/23/2014  . Essential hypertension   . Family history of pheochromocytoma 02/14/2016  . GERD (gastroesophageal reflux disease)   . Herpes simplex 08/17/2016  . History of adenomatous polyp of colon 06/14/2016  . History of blood transfusion 1970  . Hypothyroidism 10/30/2013  . Hypotonic bladder 10/30/2013  . Lumbar paraspinal muscle spasm 04/21/2015  . Major depression, recurrent ([place]) 07/14/2013  . Malaise and fatigue 04/21/2015  . MCI (mild cognitive impairment) 08/18/2015  . Menopause 03/31/2014  . Migraine   . Mild persistent asthma 05/13/2015  . Neuropathy 09/22/2014  . Personality disorder ([place]) 10/19/2012  . Pharyngoesophageal dysphagia 04/21/2015  . Plantar fasciitis 01/20/2016  . Precordial chest pain  08/18/2015  . Prolapse of vaginal vault after hysterectomy 10/30/2013  . Rectocele 10/30/2013  . Sinusitis, chronic 10/30/2013  . Slow transit constipation 07/23/2014  . Swelling of lower limb 05/17/2016  . Tension-type headache, not intractable 09/17/2015  . Urethral stricture 10/30/2013  . Urinary incontinence without sensory awareness 07/17/2016  . Varicose veins of both lower extremities 04/24/2016  . Venous insufficiency (chronic) (peripheral) 06/06/2016   Exam No conversational dyspnea Age appropriate judgment and insight Nml affect and mood  Sore throat - Plan: azithromycin (ZITHROMAX) 250 MG tablet, fluconazole (DIFLUCAN) 150 MG tablet, promethazine-dextromethorphan (PROMETHAZINE-DM) 6.25-15 MG/5ML syrup  Given exposure to strep, will tx. Diflucan, Tylenol, push fluids, throw out toothbrush after 24 hrs on abx. Warnings about AE's w syrup discussed. She has been on before.  Fu prn.  Total time: 11 min Pt voiced understanding and agreement to the plan.  [place], DO 05/14/19 7:46 AM

## 2019-05-16 ENCOUNTER — Encounter: Payer: Self-pay | Admitting: Family Medicine

## 2019-05-17 ENCOUNTER — Encounter: Payer: Self-pay | Admitting: Family Medicine

## 2019-05-19 DIAGNOSIS — F449 Dissociative and conversion disorder, unspecified: Secondary | ICD-10-CM | POA: Diagnosis not present

## 2019-05-19 DIAGNOSIS — F330 Major depressive disorder, recurrent, mild: Secondary | ICD-10-CM | POA: Diagnosis not present

## 2019-05-19 DIAGNOSIS — F4322 Adjustment disorder with anxiety: Secondary | ICD-10-CM | POA: Diagnosis not present

## 2019-06-06 ENCOUNTER — Encounter: Payer: Self-pay | Admitting: Family Medicine

## 2019-06-06 NOTE — Telephone Encounter (Signed)
I called pt back regarding scheduling appt for [facility]. I advised her only virtual was available. I requested transferring her to NT due to wheezing/coughing and not able to get thru a sentence. Pt expressed concern and is going to go to UC near her home and maybe see if they can do COVID testing as well.

## 2019-06-09 ENCOUNTER — Encounter: Payer: Self-pay | Admitting: Family Medicine

## 2019-06-10 ENCOUNTER — Encounter: Payer: Self-pay | Admitting: Medical

## 2019-06-10 ENCOUNTER — Ambulatory Visit (INDEPENDENT_AMBULATORY_CARE_PROVIDER_SITE_OTHER): Payer: PPO | Admitting: Medical

## 2019-06-10 ENCOUNTER — Other Ambulatory Visit: Payer: Self-pay

## 2019-06-10 VITALS — Temp 97.8°F | Ht 66.5 in | Wt 143.0 lb

## 2019-06-10 DIAGNOSIS — R05 Cough: Secondary | ICD-10-CM | POA: Diagnosis not present

## 2019-06-10 DIAGNOSIS — J3489 Other specified disorders of nose and nasal sinuses: Secondary | ICD-10-CM

## 2019-06-10 DIAGNOSIS — H9203 Otalgia, bilateral: Secondary | ICD-10-CM | POA: Diagnosis not present

## 2019-06-10 DIAGNOSIS — R059 Cough, unspecified: Secondary | ICD-10-CM

## 2019-06-10 MED ORDER — PREDNISONE 10 MG PO TABS: ORAL_TABLET | ORAL | 0 refills | Status: DC

## 2019-06-10 MED ORDER — FLUCONAZOLE 150 MG PO TABS: 150.0000 mg | ORAL_TABLET | Freq: Once | ORAL | 0 refills | Status: DC

## 2019-06-10 MED ORDER — FLUCONAZOLE 150 MG PO TABS: 150.0000 mg | ORAL_TABLET | Freq: Once | ORAL | 0 refills | Status: AC

## 2019-06-10 MED ORDER — AZITHROMYCIN 250 MG PO TABS: ORAL_TABLET | ORAL | 0 refills | Status: DC

## 2019-06-10 NOTE — Progress Notes (Signed)
   Subjective:    Patient ID: Chessica Gillean, female    DOB: 05/13/1951, 69 y.o.   MRN: [id number]  HPI  Virtual Visit via Video Note  I connected with [name] on 06/10/19 at 11:20 AM EST by a video enabled telemedicine application and verified that I am speaking with the correct person using two identifiers.  Location: Patient: home Provider: office   I discussed the limitations of evaluation and management by telemedicine and the availability of in person appointments. The patient expressed understanding and agreed to proceed.  History of Present Illness: Pt states wed/thursday got nasal congestion, ear pain, pnd and sinus pressure. Rt side maxillary sinus pressure as well as HA. 2 nights or posterior neck pain   Observations/Objective: General-no acute distress, pleasant, oriented. Lungs- on inspection lungs appear unlabored. Neck- no tracheal deviation or jvd on inspection. Neuro- gross motor function appears intact. heent- rt side maxillary sinus pressure on self palpation.  Assessment and Plan: You do have recent symptoms of sinus infection and ear pain.  With recent symptoms, history of sinus infections and allergies,  I decided to go ahead and prescribe a azithromycin and tapered prednisone.  On discussion you have had prednisone thousand 4 with no adverse side effects went ahead and prescribe.  In addition advised to continue Astelin nasal spray and use Phenergan cough syrup if needed.  Recommend going ahead and getting Covid test done at [facility] location test center.  Advised quarantine pending those test results.  Follow-up date pending Covid test results and clinical response to treatment.  Follow Up Instructions:    I discussed the assessment and treatment plan with the patient. The patient was provided an opportunity to ask questions and all were answered. The patient agreed with the plan and demonstrated an understanding of the instructions.   The patient  was advised to call back or seek an in-person evaluation if the symptoms worsen or if the condition fails to improve as anticipated.  I provided 20 minutes of non-face-to-face time during this encounter.   Mackie Pai, PA-C   Review of Systems  Constitutional: Negative for chills, fatigue and fever.  HENT: Positive for congestion, ear pain, sinus pressure and sinus pain.   Respiratory: Negative for cough, chest tightness, shortness of breath and wheezing.   Cardiovascular: Negative for chest pain and palpitations.  Gastrointestinal: Negative for abdominal pain, blood in stool, diarrhea, rectal pain and vomiting.  Musculoskeletal: Negative for back pain.  Neurological: Positive for headaches. Negative for dizziness and speech difficulty.  Hematological: Negative for adenopathy. Does not bruise/bleed easily.  Psychiatric/Behavioral: Negative for behavioral problems, confusion and dysphoric mood.       Objective:   Physical Exam        Assessment & Plan:

## 2019-06-10 NOTE — Patient Instructions (Addendum)
You do have recent symptoms of sinus infection and ear pain.  With recent symptoms, history of sinus infections and allergies,  I decided to go ahead and prescribe a azithromycin and tapered prednisone.  On discussion you have had prednisone thousand 4 with no adverse side effects went ahead and prescribe.  In addition advised to continue Astelin nasal spray and use Phenergan cough syrup if needed.  Recommend going ahead and getting Covid test done at [facility] location test center.  Advised quarantine pending those test results.  Follow-up date pending Covid test results and clinical response to treatment.

## 2019-06-13 ENCOUNTER — Other Ambulatory Visit: Payer: Self-pay

## 2019-06-13 ENCOUNTER — Ambulatory Visit: Payer: PPO

## 2019-06-13 ENCOUNTER — Ambulatory Visit: Payer: PPO | Attending: Internal Medicine

## 2019-06-13 DIAGNOSIS — Z20822 Contact with and (suspected) exposure to covid-19: Secondary | ICD-10-CM | POA: Diagnosis not present

## 2019-06-14 LAB — NOVEL CORONAVIRUS, NAA: SARS-CoV-2, NAA: NOT DETECTED

## 2019-06-30 DIAGNOSIS — G44209 Tension-type headache, unspecified, not intractable: Secondary | ICD-10-CM | POA: Diagnosis not present

## 2019-06-30 DIAGNOSIS — M542 Cervicalgia: Secondary | ICD-10-CM | POA: Diagnosis not present

## 2019-07-10 DIAGNOSIS — F332 Major depressive disorder, recurrent severe without psychotic features: Secondary | ICD-10-CM | POA: Diagnosis not present

## 2019-07-16 DIAGNOSIS — M542 Cervicalgia: Secondary | ICD-10-CM | POA: Diagnosis not present

## 2019-07-16 DIAGNOSIS — G44209 Tension-type headache, unspecified, not intractable: Secondary | ICD-10-CM | POA: Diagnosis not present

## 2019-07-16 DIAGNOSIS — M545 Low back pain: Secondary | ICD-10-CM | POA: Diagnosis not present

## 2019-07-25 ENCOUNTER — Encounter: Payer: Self-pay | Admitting: Family Medicine

## 2019-07-25 ENCOUNTER — Ambulatory Visit (INDEPENDENT_AMBULATORY_CARE_PROVIDER_SITE_OTHER): Payer: PPO | Admitting: Family Medicine

## 2019-07-25 ENCOUNTER — Telehealth: Payer: Self-pay | Admitting: Family Medicine

## 2019-07-25 ENCOUNTER — Other Ambulatory Visit: Payer: Self-pay

## 2019-07-25 DIAGNOSIS — J4540 Moderate persistent asthma, uncomplicated: Secondary | ICD-10-CM

## 2019-07-25 DIAGNOSIS — T50Z95A Adverse effect of other vaccines and biological substances, initial encounter: Secondary | ICD-10-CM | POA: Diagnosis not present

## 2019-07-25 MED ORDER — OMEPRAZOLE 40 MG PO CPDR: 40.0000 mg | DELAYED_RELEASE_CAPSULE | Freq: Every day | ORAL | 1 refills | Status: DC

## 2019-07-25 MED ORDER — ALBUTEROL SULFATE HFA 108 (90 BASE) MCG/ACT IN AERS: 2.0000 | INHALATION_SPRAY | RESPIRATORY_TRACT | 1 refills | Status: DC | PRN

## 2019-07-25 MED ORDER — FLUTICASONE-SALMETEROL 250-50 MCG/DOSE IN AEPB: 1.0000 | INHALATION_SPRAY | Freq: Two times a day (BID) | RESPIRATORY_TRACT | 8 refills | Status: DC

## 2019-07-25 MED ORDER — ONDANSETRON 4 MG PO TBDP: 4.0000 mg | ORAL_TABLET | Freq: Three times a day (TID) | ORAL | 0 refills | Status: DC | PRN

## 2019-07-25 NOTE — Telephone Encounter (Signed)
She has appt this afternoon at 4:15 Virtual Visit this afternoon

## 2019-07-25 NOTE — Telephone Encounter (Signed)
Patient is experiencing issues with taking Covid 19 shot. Patient is coughing,Shortness of Breath, and Chest Discomfort. Patient transferred to Triage RN, Per patient, RN stated that patient needs an appointment.

## 2019-07-25 NOTE — Progress Notes (Signed)
Chief Complaint  Patient presents with  . Follow-up    Covid Vaccine reaction    Subjective: Patient is a 69 y.o. female here for vaccine reaction. Due to COVID-19 pandemic, we are interacting via web portal for an electronic face-to-face visit. I verified patient's ID using 2 identifiers. Patient agreed to proceed with visit via this method. Patient is at home, I am at office. Patient and I are present for visit.   Received 2nd [place] covid vaccine 4 d ago. Myalgias, rash, fatigue, coughing, , nausea, chills. Slightly better today. No sick contacts. SABA has been helpful, requesting refill.   ROS: Const: No fevers Lungs: Denies SOB   Past Medical History:  Diagnosis Date  . Adjustment disorder with anxious mood 04/21/2015  . Allergic rhinitis due to pollen 07/23/2014  . Alopecia 10/30/2013  . Arthritis 04/24/2016  . Atrophic vaginitis 10/30/2013  . Basal cell carcinoma 04/21/2015  . Cancer ([place])    skin cancer  . Cervical radiculopathy 09/17/2015  . Change in bowel function 06/14/2016  . Chronic diarrhea 12/16/2015  . Cystocele, midline 10/30/2013  . Dermatitis 09/22/2014  . Diverticulosis of large intestine 10/30/2013  . Dyspareunia 07/23/2014  . Essential hypertension   . Family history of pheochromocytoma 02/14/2016  . GERD (gastroesophageal reflux disease)   . Herpes simplex 08/17/2016  . History of adenomatous polyp of colon 06/14/2016  . History of blood transfusion 1970  . Hypothyroidism 10/30/2013  . Hypotonic bladder 10/30/2013  . Lumbar paraspinal muscle spasm 04/21/2015  . Major depression, recurrent ([place]) 07/14/2013  . Malaise and fatigue 04/21/2015  . MCI (mild cognitive impairment) 08/18/2015  . Menopause 03/31/2014  . Migraine   . Mild persistent asthma 05/13/2015  . Neuropathy 09/22/2014  . Personality disorder ([place]) 10/19/2012  . Pharyngoesophageal dysphagia 04/21/2015  . Plantar fasciitis 01/20/2016  . Precordial chest pain 08/18/2015  . Prolapse of vaginal vault after  hysterectomy 10/30/2013  . Rectocele 10/30/2013  . Sinusitis, chronic 10/30/2013  . Slow transit constipation 07/23/2014  . Swelling of lower limb 05/17/2016  . Tension-type headache, not intractable 09/17/2015  . Urethral stricture 10/30/2013  . Urinary incontinence without sensory awareness 07/17/2016  . Varicose veins of both lower extremities 04/24/2016  . Venous insufficiency (chronic) (peripheral) 06/06/2016    Objective: No conversational dyspnea Age appropriate judgment and insight Nml affect and mood  Assessment and Plan: Adverse effect of vaccine, initial encounter - Plan: ondansetron (ZOFRAN-ODT) 4 MG disintegrating tablet  Moderate persistent asthma without complication - Plan: albuterol (PROAIR HFA) 108 (90 Base) MCG/ACT inhaler, Fluticasone-Salmeterol (ADVAIR DISKUS) 250-50 MCG/DOSE AEPB  Needs more time. Symptomatic tx.  F/u in 1 mo for CPE. The patient voiced understanding and agreement to the plan.  [place], DO 07/25/19  3:33 PM

## 2019-07-27 ENCOUNTER — Emergency Department (HOSPITAL_BASED_OUTPATIENT_CLINIC_OR_DEPARTMENT_OTHER): Payer: PPO

## 2019-07-27 ENCOUNTER — Emergency Department (HOSPITAL_BASED_OUTPATIENT_CLINIC_OR_DEPARTMENT_OTHER)
Admission: EM | Admit: 2019-07-27 | Discharge: 2019-07-27 | Disposition: A | Discharge status: Home / Self Care | Payer: PPO | Attending: Emergency Medicine | Admitting: Emergency Medicine

## 2019-07-27 ENCOUNTER — Other Ambulatory Visit: Payer: Self-pay

## 2019-07-27 ENCOUNTER — Encounter (HOSPITAL_BASED_OUTPATIENT_CLINIC_OR_DEPARTMENT_OTHER): Payer: Self-pay | Admitting: Emergency Medicine

## 2019-07-27 DIAGNOSIS — I10 Essential (primary) hypertension: Secondary | ICD-10-CM | POA: Diagnosis not present

## 2019-07-27 DIAGNOSIS — Z20822 Contact with and (suspected) exposure to covid-19: Secondary | ICD-10-CM | POA: Insufficient documentation

## 2019-07-27 DIAGNOSIS — R079 Chest pain, unspecified: Secondary | ICD-10-CM | POA: Diagnosis not present

## 2019-07-27 DIAGNOSIS — R072 Precordial pain: Secondary | ICD-10-CM | POA: Diagnosis not present

## 2019-07-27 DIAGNOSIS — J45909 Unspecified asthma, uncomplicated: Secondary | ICD-10-CM | POA: Insufficient documentation

## 2019-07-27 DIAGNOSIS — E039 Hypothyroidism, unspecified: Secondary | ICD-10-CM | POA: Diagnosis not present

## 2019-07-27 LAB — COMPREHENSIVE METABOLIC PANEL
ALT: 16 U/L (ref 0–44)
AST: 17 U/L (ref 15–41)
Albumin: 4 g/dL (ref 3.5–5.0)
Alkaline Phosphatase: 58 U/L (ref 38–126)
Anion gap: 8 (ref 5–15)
BUN: 10 mg/dL (ref 8–23)
CO2: 22 mmol/L (ref 22–32)
Calcium: 8.8 mg/dL — ABNORMAL LOW (ref 8.9–10.3)
Chloride: 102 mmol/L (ref 98–111)
Creatinine, Ser: 0.63 mg/dL (ref 0.44–1.00)
GFR calc Af Amer: >60 mL/min (ref >60)
GFR calc non Af Amer: >60 mL/min (ref >60)
Glucose, Bld: 102 mg/dL — ABNORMAL HIGH (ref 70–99)
Potassium: 3.5 mmol/L (ref 3.5–5.1)
Sodium: 132 mmol/L — ABNORMAL LOW (ref 135–145)
Total Bilirubin: 0.2 mg/dL — ABNORMAL LOW (ref 0.3–1.2)
Total Protein: 6.8 g/dL (ref 6.5–8.1)

## 2019-07-27 LAB — CBC WITH DIFFERENTIAL/PLATELET
Abs Immature Granulocytes: 0.02 10*3/uL (ref 0.00–0.07)
Basophils Absolute: 0 10*3/uL (ref 0.0–0.1)
Basophils Relative: 1 %
Eosinophils Absolute: 0.1 10*3/uL (ref 0.0–0.5)
Eosinophils Relative: 2 %
HCT: 39.2 % (ref 36.0–46.0)
Hemoglobin: 13.4 g/dL (ref 12.0–15.0)
Immature Granulocytes: 0 %
Lymphocytes Relative: 39 %
Lymphs Abs: 2.2 10*3/uL (ref 0.7–4.0)
MCH: 30.1 pg (ref 26.0–34.0)
MCHC: 34.2 g/dL (ref 30.0–36.0)
MCV: 88.1 fL (ref 80.0–100.0)
Monocytes Absolute: 0.6 10*3/uL (ref 0.1–1.0)
Monocytes Relative: 11 %
Neutro Abs: 2.7 10*3/uL (ref 1.7–7.7)
Neutrophils Relative %: 47 %
Platelets: 262 10*3/uL (ref 150–400)
RBC: 4.45 MIL/uL (ref 3.87–5.11)
RDW: 12.4 % (ref 11.5–15.5)
WBC: 5.7 10*3/uL (ref 4.0–10.5)
nRBC: 0 % (ref 0.0–0.2)

## 2019-07-27 LAB — D-DIMER, QUANTITATIVE: D-Dimer, Quant: 0.43 ug/mL-FEU (ref 0.00–0.50)

## 2019-07-27 LAB — TROPONIN I (HIGH SENSITIVITY)
Troponin I (High Sensitivity): 3 ng/L (ref <18)
Troponin I (High Sensitivity): 5 ng/L (ref <18)

## 2019-07-27 LAB — BRAIN NATRIURETIC PEPTIDE: B Natriuretic Peptide: 45.5 pg/mL (ref 0.0–100.0)

## 2019-07-27 LAB — SARS CORONAVIRUS 2 AG (30 MIN TAT): SARS Coronavirus 2 Ag: NEGATIVE

## 2019-07-27 MED ORDER — MORPHINE SULFATE (PF) 2 MG/ML IV SOLN
2.0000 mg | Freq: Once | INTRAVENOUS | Status: AC
  Administered 2019-07-27: 2 mg via INTRAVENOUS
  Filled 2019-07-27: qty 1

## 2019-07-27 MED ORDER — KETOROLAC TROMETHAMINE 30 MG/ML IJ SOLN
30.0000 mg | Freq: Once | INTRAMUSCULAR | Status: AC
  Administered 2019-07-27: 30 mg via INTRAVENOUS
  Filled 2019-07-27: qty 1

## 2019-07-27 MED ORDER — ONDANSETRON HCL 4 MG/2ML IJ SOLN
4.0000 mg | Freq: Once | INTRAMUSCULAR | Status: AC
  Administered 2019-07-27: 4 mg via INTRAVENOUS
  Filled 2019-07-27: qty 2

## 2019-07-27 NOTE — ED Notes (Signed)
Portable Xray at bedside.

## 2019-07-27 NOTE — ED Provider Notes (Signed)
Emergency Department Provider Note   I have reviewed the triage vital signs and the nursing notes.   HISTORY  Chief Complaint Chest Pain   HPI Jenna Campbell is a 69 y.o. female with past medical history reviewed below presents to the emergency department with constant chest pain over the past 6 days.  She describes constant discomfort without relief at all in the past 6 days.  Initially describes the chest pain as burning especially in the upper back and shoulder but also some tightness component.  No pleuritic quality.  Pain is not worse with movement, exertion, deep breathing.  She tried nitroglycerin without relief.  She has no history of ACS or PE.  She states that symptoms began shortly after she got the COVID-19 vaccine also 6 days ago.  She denies any rash, tongue/throat swelling.  She has developed some nausea without vomiting and nonbloody diarrhea.  She is now experiencing abdominal pain or lower back discomfort.  No UTI symptoms.  No other modifying factors. Arrives POV.    Past Medical History:  Diagnosis Date  . Adjustment disorder with anxious mood 04/21/2015  . Allergic rhinitis due to pollen 07/23/2014  . Alopecia 10/30/2013  . Arthritis 04/24/2016  . Atrophic vaginitis 10/30/2013  . Basal cell carcinoma 04/21/2015  . Cancer ([place])    skin cancer  . Cervical radiculopathy 09/17/2015  . Change in bowel function 06/14/2016  . Chronic diarrhea 12/16/2015  . Cystocele, midline 10/30/2013  . Dermatitis 09/22/2014  . Diverticulosis of large intestine 10/30/2013  . Dyspareunia 07/23/2014  . Essential hypertension   . Family history of pheochromocytoma 02/14/2016  . GERD (gastroesophageal reflux disease)   . Herpes simplex 08/17/2016  . History of adenomatous polyp of colon 06/14/2016  . History of blood transfusion 1970  . Hypothyroidism 10/30/2013  . Hypotonic bladder 10/30/2013  . Lumbar paraspinal muscle spasm 04/21/2015  . Major depression, recurrent ([place]) 07/14/2013  . Malaise  and fatigue 04/21/2015  . MCI (mild cognitive impairment) 08/18/2015  . Menopause 03/31/2014  . Migraine   . Mild persistent asthma 05/13/2015  . Neuropathy 09/22/2014  . Personality disorder ([place]) 10/19/2012  . Pharyngoesophageal dysphagia 04/21/2015  . Plantar fasciitis 01/20/2016  . Precordial chest pain 08/18/2015  . Prolapse of vaginal vault after hysterectomy 10/30/2013  . Rectocele 10/30/2013  . Sinusitis, chronic 10/30/2013  . Slow transit constipation 07/23/2014  . Swelling of lower limb 05/17/2016  . Tension-type headache, not intractable 09/17/2015  . Urethral stricture 10/30/2013  . Urinary incontinence without sensory awareness 07/17/2016  . Varicose veins of both lower extremities 04/24/2016  . Venous insufficiency (chronic) (peripheral) 06/06/2016    Patient Active Problem List   Diagnosis Date Noted  . Tinnitus of both ears 10/11/2017  . Insomnia 10/11/2017  . Palpitation 09/03/2017  . Chest pain in adult 05/28/2017  . Allergy   . Asthma   . Cancer ([place])   . Colon polyps   . Essential hypertension   . GERD (gastroesophageal reflux disease)   . H/O: hysterectomy   . Thyroid disease   . Urinary incontinence   . Nonintractable headache 11/02/2016  . Herpes simplex 08/17/2016  . Shortness of breath 07/17/2016  . Urinary incontinence without sensory awareness 07/17/2016  . Change in bowel function 06/14/2016  . Esophageal dysphagia 06/14/2016  . History of adenomatous polyp of colon 06/14/2016  . Venous insufficiency (chronic) (peripheral) 06/06/2016  . Swelling of lower limb 05/17/2016  . Arthritis 04/24/2016  . Varicose veins of both lower extremities  04/24/2016  . Cough 02/14/2016  . Family history of pheochromocytoma 02/14/2016  . Plantar fasciitis 01/20/2016  . Acute tension-type headache 12/16/2015  . Chronic diarrhea 12/16/2015  . Dysuria 12/16/2015  . Generalized abdominal pain 12/16/2015  . Localized edema 12/16/2015  . Mild intermittent extrinsic asthma  12/16/2015  . Cervical radiculopathy 09/17/2015  . Neck pain 09/17/2015  . Low back pain 09/17/2015  . Memory difficulty 09/17/2015  . Tension-type headache, not intractable 09/17/2015  . MCI (mild cognitive impairment) 08/18/2015  . Precordial chest pain 08/18/2015  . Mild persistent asthma 05/13/2015  . Other allergic rhinitis 05/13/2015  . Gastroesophageal reflux disease without esophagitis 05/13/2015  . Allergic urticaria 05/13/2015  . Acute bronchitis due to other specified organisms 04/21/2015  . Adjustment disorder with anxious mood 04/21/2015  . Basal cell carcinoma 04/21/2015  . Bilateral impacted cerumen 04/21/2015  . Lumbar paraspinal muscle spasm 04/21/2015  . Malaise and fatigue 04/21/2015  . Pharyngoesophageal dysphagia 04/21/2015  . Shin splint 04/21/2015  . Pelvic pain in female 02/17/2015  . Allergic asthma with acute exacerbation 09/22/2014  . Dermatitis 09/22/2014  . Neuropathy 09/22/2014  . Allergic rhinitis due to pollen 07/23/2014  . Dyspareunia 07/23/2014  . Slow transit constipation 07/23/2014  . Menopause 03/31/2014  . Midline low back pain without sciatica 03/31/2014  . Alopecia 10/30/2013  . Cystocele, midline 10/30/2013  . Diverticulosis of large intestine 10/30/2013  . Hypothyroidism 10/30/2013  . Hypotonic bladder 10/30/2013  . Atrophic vaginitis 10/30/2013  . Prolapse of vaginal vault after hysterectomy 10/30/2013  . Rectocele 10/30/2013  . Sinusitis, chronic 10/30/2013  . Urethral stricture 10/30/2013  . Major depression, recurrent ([place]) 07/14/2013  . 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 Encounter for Mikhi Athey-term (current) use of other medications 10/19/2012  . Migraine 10/19/2012  . Personality disorder ([place]) 10/19/2012  . Hx of migraines 05/15/2009  . History of blood transfusion 05/15/1968    Past Surgical History:  Procedure Laterality Date  . BREAST CYST ASPIRATION Left    25 years ago   . MOHS SURGERY  10/05/2015  . RECTOCELE  REPAIR  2011  . SKIN CANCER EXCISION  02/2015   Squamous cell removed from back  . TONSILLECTOMY    . VAGINAL HYSTERECTOMY    . VAGINAL PROLAPSE REPAIR      Allergies Iodinated diagnostic agents, Kenalog [triamcinolone acetonide], Metrizamide, Other, Montelukast sodium, Penicillins, Ciprofloxacin, Diazepam, and Sulfa antibiotics  Family History  Problem Relation Age of Onset  . Asthma Father   . Angina Father   . Emphysema Father   . Alzheimer's disease Mother   . Cancer Sister        died of cancer    Social History Social History   Tobacco Use  . Smoking status: Never Smoker  . Smokeless tobacco: Never Used  Substance Use Topics  . Alcohol use: Yes  . Drug use: No    Review of Systems  Constitutional: No fever/chills Eyes: No visual changes. ENT: No sore throat. Cardiovascular: Positive chest pain. Respiratory: Positive shortness of breath. Gastrointestinal: No abdominal pain. Positive nausea, no vomiting.  Positive diarrhea.  No constipation. Genitourinary: Negative for dysuria. Musculoskeletal: Negative for back pain. Pain in the posterior left shoulder and arm.  Skin: Negative for rash. Neurological: Negative for headaches, focal weakness or numbness.  10-point ROS otherwise negative.  ____________________________________________   PHYSICAL EXAM:  VITAL SIGNS: ED Triage Vitals  Enc Vitals Group     BP 07/27/19 1821 (!) 186/89     Pulse Rate 07/27/19  1821 60     Resp 07/27/19 1821 18     Temp 07/27/19 1821 97.8 F (36.6 C)     Temp Source 07/27/19 1821 Oral     SpO2 07/27/19 1820 100 %     Weight 07/27/19 1823 142 lb (64.4 kg)     Height 07/27/19 1823 5' 6.5" (1.689 m)   Constitutional: Alert and oriented. Well appearing and in no acute distress. Eyes: Conjunctivae are normal.  Head: Atraumatic. Nose: No congestion/rhinnorhea. Mouth/Throat: Mucous membranes are moist. Neck: No stridor. No thyromegaly or tender masses.  Cardiovascular: Normal  rate, regular rhythm. Good peripheral circulation. Grossly normal heart sounds.   Respiratory: Normal respiratory effort.  No retractions. Lungs CTAB. No wheezing.  Gastrointestinal: Soft and nontender. No distention.  Musculoskeletal: No lower extremity tenderness nor edema. No gross deformities of extremities.  Neurologic:  Normal speech and language. No gross focal neurologic deficits are appreciated.  Skin:  Skin is warm, dry and intact. No rash noted.   ____________________________________________   LABS (all labs ordered are listed, but only abnormal results are displayed)  Labs Reviewed  COMPREHENSIVE METABOLIC PANEL - Abnormal; Notable for the following components:      Result Value   Sodium 132 (*)    Glucose, Bld 102 (*)    Calcium 8.8 (*)    Total Bilirubin 0.2 (*)    All other components within normal limits  SARS CORONAVIRUS 2 AG (30 MIN TAT)  BRAIN NATRIURETIC PEPTIDE  CBC WITH DIFFERENTIAL/PLATELET  D-DIMER, QUANTITATIVE (NOT AT [facility])  TROPONIN I (HIGH SENSITIVITY)  TROPONIN I (HIGH SENSITIVITY)   ____________________________________________  EKG   EKG Interpretation  Date/Time:  Sunday July 27 2019 18:24:20 EDT Ventricular Rate:  64 PR Interval:    QRS Duration: 110 QT Interval:  481 QTC Calculation: 497 R Axis:   70 Text Interpretation: Sinus rhythm Similar to 2019 tracing. No STEMI Confirmed by Nanda Quinton [id number]) on 07/27/2019 6:26:25 PM       ____________________________________________  RADIOLOGY  DG Chest Portable 1 View  Result Date: 07/27/2019 CLINICAL DATA:  Chest pain for 5 days EXAM: PORTABLE CHEST 1 VIEW COMPARISON:  07/09/2018 FINDINGS: The heart size and mediastinal contours are within normal limits. Both lungs are clear. The visualized skeletal structures are unremarkable. IMPRESSION: No acute abnormality of the lungs in AP portable projection. Electronically Signed   By: Eddie Candle M.D.   On: 07/27/2019 18:57     ____________________________________________   PROCEDURES  Procedure(s) performed:   Procedures  None  ____________________________________________   INITIAL IMPRESSION / ASSESSMENT AND PLAN / ED COURSE  Pertinent labs & imaging results that were available during my care of the patient were reviewed by me and considered in my medical decision making (see chart for details).   Patient presents to the emergency room evaluation of constant chest discomfort with nausea, diarrhea, fatigue over the past 6 days.  She is having active chest discomfort.  EKG is reassuring with no acute ischemic changes and is similar to her 2019 tracing.  Oxygen saturation is normal.  Pulses normal.  Afebrile.  Plan for rapid Covid testing along with chest x-ray and labs including troponin and D-dimer.   09:40 PM  Repeat troponin is 5.  D-dimer, Covid testing, labs are normal.  Patient pain is improved.  We discussed follow-up plan including calling her cardiologist, Dr. Bettina Gavia, first thing tomorrow to schedule a follow-up appointment.  Her heart score is 5 but with her troponin testing here and overall  symptoms/history I feel comfortable with discharge with close PCP and cardiology follow-up.  We discussed this and patient is comfortable with the plan.  She is understands that she will need to return to the emergency department immediately and/or call 911 if her chest pain suddenly worsens or changes.  ____________________________________________  FINAL CLINICAL IMPRESSION(S) / ED DIAGNOSES  Final diagnoses:  Precordial chest pain    MEDICATIONS GIVEN DURING THIS VISIT:  Medications  morphine 2 MG/ML injection 2 mg (2 mg Intravenous Given 07/27/19 1844)  ondansetron (ZOFRAN) injection 4 mg (4 mg Intravenous Given 07/27/19 1844)  ketorolac (TORADOL) 30 MG/ML injection 30 mg (30 mg Intravenous Given 07/27/19 1929)    Note:  This document was prepared using Dragon voice recognition software and may  include unintentional dictation errors.  Nanda Quinton, MD, [facility] Emergency Medicine    Yukie Bergeron, [facility], MD 07/27/19 2152

## 2019-07-27 NOTE — Discharge Instructions (Signed)
Nursing the emergency department today with chest pain.  Your work-up today included tests for heart attack as well as blood clots in the lungs.  Those were all normal and your chest x-ray was clear.  Please continue all your home medications and call both your cardiologist and primary care doctor first thing tomorrow morning to schedule follow-up appointments.  If your symptoms change or suddenly worsen you should return to the emergency department immediately or call 911.

## 2019-07-27 NOTE — ED Triage Notes (Signed)
Reports central chest pressure that radiates into left chest and left upper back since Tuesday.  Also endorses nausea but no vomiting and diarrhea since last night.  Using ntg sl at home with no relief.

## 2019-07-27 NOTE — ED Notes (Signed)
ED Provider at bedside. 

## 2019-07-27 NOTE — ED Notes (Signed)
Pt ambulatory to restroom, standby assist, pt holding handrail. No s/s of distress

## 2019-07-27 NOTE — ED Notes (Signed)
Pt discharged to home. Discharge instructions have been discussed with patient and/or family members. Pt verbally acknowledges understanding d/c instructions.

## 2019-07-28 ENCOUNTER — Telehealth: Payer: Self-pay | Admitting: Cardiology

## 2019-07-28 ENCOUNTER — Other Ambulatory Visit: Payer: Self-pay

## 2019-07-28 NOTE — Telephone Encounter (Signed)
Spoke with patient who states she is a patient of Dr. Joya Gaskins in [facility] and was advised by the doctor in the ED to get a follow-up appointment with cardiology as soon as possible. I advised patient that Dr. Bettina Gavia is not in [facility] this week but that I will send their office scheduler a message to get patient scheduled with one of Dr. Joya Gaskins partners. I advised her that someone will call her back to get her scheduled and she verbalized understanding and agreement. She thanked me for the call.

## 2019-07-28 NOTE — Telephone Encounter (Signed)
Can we call and get her an appointment with Dr. Geraldo Pitter this week?

## 2019-07-28 NOTE — Telephone Encounter (Signed)
New Message:   Pt was seen in [facility] ER last night for chest pain, She was told to see her Cardiologist asap. See notes in Epic. Dr Bettina Gavia have no availability for this week.

## 2019-07-28 NOTE — Telephone Encounter (Signed)
Patient has appt 07/29/2018 at 8:20 AM

## 2019-07-29 ENCOUNTER — Encounter: Payer: Self-pay | Admitting: Family Medicine

## 2019-07-29 ENCOUNTER — Encounter: Payer: Self-pay | Admitting: Cardiology

## 2019-07-29 ENCOUNTER — Ambulatory Visit (INDEPENDENT_AMBULATORY_CARE_PROVIDER_SITE_OTHER): Payer: PPO | Admitting: Family Medicine

## 2019-07-29 ENCOUNTER — Other Ambulatory Visit: Payer: Self-pay

## 2019-07-29 ENCOUNTER — Ambulatory Visit (INDEPENDENT_AMBULATORY_CARE_PROVIDER_SITE_OTHER): Payer: PPO | Admitting: Cardiology

## 2019-07-29 VITALS — BP 128/72 | HR 74 | Ht 66.5 in | Wt 148.0 lb

## 2019-07-29 VITALS — BP 124/78 | HR 78 | Temp 96.8°F | Ht 66.5 in | Wt 149.5 lb

## 2019-07-29 DIAGNOSIS — Z8489 Family history of other specified conditions: Secondary | ICD-10-CM | POA: Diagnosis not present

## 2019-07-29 DIAGNOSIS — E871 Hypo-osmolality and hyponatremia: Secondary | ICD-10-CM | POA: Diagnosis not present

## 2019-07-29 DIAGNOSIS — R600 Localized edema: Secondary | ICD-10-CM

## 2019-07-29 DIAGNOSIS — M79605 Pain in left leg: Secondary | ICD-10-CM

## 2019-07-29 DIAGNOSIS — R0789 Other chest pain: Secondary | ICD-10-CM | POA: Insufficient documentation

## 2019-07-29 DIAGNOSIS — I209 Angina pectoris, unspecified: Secondary | ICD-10-CM

## 2019-07-29 DIAGNOSIS — I200 Unstable angina: Secondary | ICD-10-CM | POA: Insufficient documentation

## 2019-07-29 DIAGNOSIS — M79604 Pain in right leg: Secondary | ICD-10-CM | POA: Diagnosis not present

## 2019-07-29 HISTORY — DX: Unstable angina: I20.0

## 2019-07-29 HISTORY — DX: Angina pectoris, unspecified: I20.9

## 2019-07-29 HISTORY — DX: Localized edema: R60.0

## 2019-07-29 HISTORY — DX: Other chest pain: R07.89

## 2019-07-29 LAB — COMPREHENSIVE METABOLIC PANEL
ALT: 12 U/L (ref 0–35)
AST: 13 U/L (ref 0–37)
Albumin: 3.7 g/dL (ref 3.5–5.2)
Alkaline Phosphatase: 61 U/L (ref 39–117)
BUN: 12 mg/dL (ref 6–23)
CO2: 28 mEq/L (ref 19–32)
Calcium: 8.8 mg/dL (ref 8.4–10.5)
Chloride: 103 mEq/L (ref 96–112)
Creatinine, Ser: 0.76 mg/dL (ref 0.40–1.20)
GFR: 75.35 mL/min (ref >60.00)
Glucose, Bld: 78 mg/dL (ref 70–99)
Potassium: 3.4 mEq/L — ABNORMAL LOW (ref 3.5–5.1)
Sodium: 136 mEq/L (ref 135–145)
Total Bilirubin: 0.4 mg/dL (ref 0.2–1.2)
Total Protein: 6.4 g/dL (ref 6.0–8.3)

## 2019-07-29 MED ORDER — METOPROLOL TARTRATE 100 MG PO TABS: 100.0000 mg | ORAL_TABLET | Freq: Once | ORAL | 0 refills | Status: DC

## 2019-07-29 MED ORDER — NITROGLYCERIN 0.4 MG SL SUBL: 0.4000 mg | SUBLINGUAL_TABLET | SUBLINGUAL | 3 refills | Status: DC | PRN

## 2019-07-29 MED ORDER — PREDNISONE 50 MG PO TABS: ORAL_TABLET | ORAL | 0 refills | Status: DC

## 2019-07-29 NOTE — Patient Instructions (Signed)
Medication Instructions:  No medication changes *If you need a refill on your cardiac medications before your next appointment, please call your pharmacy*   Lab Work: You will need to have a BMET within 1 week of your CT scan. You need to have labs done when you are fasting.  You can come Monday through Friday 8:30 am to 12:00 pm and 1:15 to 4:30. You do not need to make an appointment as the order has already been placed. If you have labs (blood work) drawn today and your tests are completely normal, you will receive your results only by: Marland Kitchen MyChart Message (if you have MyChart) OR . A paper copy in the mail If you have any lab test that is abnormal or we need to change your treatment, we will call you to review the results.   Testing/Procedures: Your physician has requested that you have cardiac CT. Cardiac computed tomography (CT) is a painless test that uses an x-ray machine to take clear, detailed pictures of your heart. For further information please visit [email or web address].[email or web address]. Please follow instruction sheet as given.  Your cardiac CT will be scheduled at one of the below locations:   [facility] [address] [place], [place] [address] [phone]  [facility] [address] [place],  [address] [phone]  If scheduled at [facility], please arrive at the [facility] main entrance of [facility] 30 minutes prior to test start time. Proceed to the [facility] Radiology Department (first floor) to check-in and test prep.  If scheduled at [facility], please arrive 15 mins early for check-in and test prep.  Please follow these instructions carefully (unless otherwise directed):   On the Night Before the Test: . Be sure to Drink plenty of water. . Do not consume any caffeinated/decaffeinated beverages or chocolate 12 hours prior to your test. . Do not take any  antihistamines 12 hours prior to your test. . If you take Metformin do not take 24 hours prior to test. . If the patient has contrast allergy: ? Patient will need a prescription for Prednisone and very clear instructions (as follows): 1. Prednisone 50 mg - take 13 hours prior to test 2. Take another Prednisone 50 mg 7 hours prior to test 3. Take another Prednisone 50 mg 1 hour prior to test 4. Take Benadryl 50 mg 1 hour prior to test  On the Day of the Test: . Drink plenty of water. Do not drink any water within one hour of the test. . Do not eat any food 4 hours prior to the test. . You may take your regular medications prior to the test.  . Take metoprolol (Lopressor) two hours prior to test. . FEMALES- please wear underwire-free bra if available      After the Test: . Drink plenty of water. . After receiving IV contrast, you may experience a mild flushed feeling. This is normal. . On occasion, you may experience a mild rash up to 24 hours after the test. This is not dangerous. If this occurs, you can take Benadryl 25 mg and increase your fluid intake. . If you experience trouble breathing, this can be serious. If it is severe call 911 IMMEDIATELY. If it is mild, please call our office. . If you take any of these medications: Glipizide/Metformin, Avandament, Glucavance, please do not take 48 hours after completing test unless otherwise instructed.   Once we have confirmed  authorization from your insurance company, we will call you to set up a date and time for your test.   For non-scheduling related questions, please contact the cardiac imaging nurse navigator should you have any questions/concerns: Marchia Bond, RN Navigator Cardiac Imaging [facility] [phone] office  For scheduling needs, including cancellations and rescheduling, please call [phone].    Follow-Up: At [facility], you and your health needs are our priority.  As part of our  continuing mission to provide you with exceptional heart care, we have created designated Provider Care Teams.  These Care Teams include your primary Cardiologist (physician) and Advanced Practice Providers (APPs -  Physician Assistants and Nurse Practitioners) who all work together to provide you with the care you need, when you need it.  We recommend signing up for the patient portal called "MyChart".  Sign up information is provided on this After Visit Summary.  MyChart is used to connect with patients for Virtual Visits (Telemedicine).  Patients are able to view lab/test results, encounter notes, upcoming appointments, etc.  Non-urgent messages can be sent to your provider as well.   To learn more about what you can do with MyChart, go to [email or web address].    Your next appointment:   3 month(s)  The format for your next appointment:   In Person  Provider:   Jyl Heinz, MD   Other Instructions NA

## 2019-07-29 NOTE — Addendum Note (Signed)
Addended by: Kelle Darting A on: 07/29/2019 01:55 PM   Modules accepted: Orders

## 2019-07-29 NOTE — Progress Notes (Signed)
Cardiology Office Note:    Date:  07/29/2019   ID:  Jenna Campbell, DOB 1950/08/30, MRN [id number]  PCP:  Jenna Pal, DO  Cardiologist:  Jenna Lindau, MD   Referring MD: Jenna Campbell*    ASSESSMENT:    1. Chest tightness   2. Angina pectoris ([place])   3. Pedal edema    PLAN:    In order of problems listed above:  1. Chest tightness/angina pectoris: Patient symptoms are very vague and difficult to understand.  Patient is a poor historian.  She has had a stress test in the past.  In view of this I recommended coronary CT angiography with FFR and she is agreeable.  Benefits and potential risks of the test explained to the patient and she vocalized understanding.  Sublingual nitroglycerin prescription was sent, its protocol and 911 protocol explained and the patient vocalized understanding questions were answered to the patient's satisfaction 2. She has bilateral pedal edema for the past several weeks.  I do not see any evaluation done for this in the emergency room.  It was probably not done because the patient has a normal D-dimer however for sake of completion I would like to do a bilateral DVT study.  Patient agrees. 3. She will be seen in follow-up appointment in 3 months or earlier if she has any concerns.  She knows to go to the nearest emergency room for any significant concerns.   Medication Adjustments/Labs and Tests Ordered: Current medicines are reviewed at length with the patient today.  Concerns regarding medicines are outlined above.  No orders of the defined types were placed in this encounter.  No orders of the defined types were placed in this encounter.    Chief Complaint  Patient presents with  . Hospitalization Follow-up     History of Present Illness:    Jenna Campbell is a 69 y.o. female.  Patient mentions to me that she went to the emergency room with chest pain.  She is a poor historian.  She appears frail.  She mentions to me that  she has chest tightness at times this is not related to exertion.  She leads a sedentary lifestyle.  Sometimes it radiates to the neck into the arm.  Nitroglycerin may or may not help the pain.  Again the patient's history is very vague and difficult to understand.  A couple of years ago she has had a stress test which was unremarkable.  She also complains of pain in both legs right greater than left.  There is some swelling to.  She states it has been going on for the past several months.  Past Medical History:  Diagnosis Date  . Adjustment disorder with anxious mood 04/21/2015  . Allergic rhinitis due to pollen 07/23/2014  . Alopecia 10/30/2013  . Arthritis 04/24/2016  . Atrophic vaginitis 10/30/2013  . Basal cell carcinoma 04/21/2015  . Cancer ([place])    skin cancer  . Cervical radiculopathy 09/17/2015  . Change in bowel function 06/14/2016  . Chronic diarrhea 12/16/2015  . Cystocele, midline 10/30/2013  . Dermatitis 09/22/2014  . Diverticulosis of large intestine 10/30/2013  . Dyspareunia 07/23/2014  . Essential hypertension   . Family history of pheochromocytoma 02/14/2016  . GERD (gastroesophageal reflux disease)   . Herpes simplex 08/17/2016  . History of adenomatous polyp of colon 06/14/2016  . History of blood transfusion 1970  . Hypothyroidism 10/30/2013  . Hypotonic bladder 10/30/2013  . Lumbar paraspinal muscle spasm 04/21/2015  .  Major depression, recurrent ([place]) 07/14/2013  . Malaise and fatigue 04/21/2015  . MCI (mild cognitive impairment) 08/18/2015  . Menopause 03/31/2014  . Migraine   . Mild persistent asthma 05/13/2015  . Neuropathy 09/22/2014  . Personality disorder ([place]) 10/19/2012  . Pharyngoesophageal dysphagia 04/21/2015  . Plantar fasciitis 01/20/2016  . Precordial chest pain 08/18/2015  . Prolapse of vaginal vault after hysterectomy 10/30/2013  . Rectocele 10/30/2013  . Sinusitis, chronic 10/30/2013  . Slow transit constipation 07/23/2014  . Swelling of lower limb 05/17/2016  .  Tension-type headache, not intractable 09/17/2015  . Urethral stricture 10/30/2013  . Urinary incontinence without sensory awareness 07/17/2016  . Varicose veins of both lower extremities 04/24/2016  . Venous insufficiency (chronic) (peripheral) 06/06/2016    Past Surgical History:  Procedure Laterality Date  . BREAST CYST ASPIRATION Left    25 years ago   . MOHS SURGERY  10/05/2015  . RECTOCELE REPAIR  2011  . SKIN CANCER EXCISION  02/2015   Squamous cell removed from back  . TONSILLECTOMY    . VAGINAL HYSTERECTOMY    . VAGINAL PROLAPSE REPAIR      Current Medications: Current Meds  Medication Sig  . albuterol (PROAIR HFA) 108 (90 Base) MCG/ACT inhaler Inhale 2 puffs into the lungs every 4 (four) hours as needed for wheezing or shortness of breath.  Marland Kitchen albuterol (PROVENTIL) (2.5 MG/3ML) 0.083% nebulizer solution Take 3 mLs (2.5 mg total) by nebulization every 6 (six) hours as needed for wheezing or shortness of breath.  Marland Kitchen azelastine (ASTELIN) 0.1 % nasal spray Place 2 sprays into both nostrils 2 (two) times daily. Use in each nostril as directed  . betamethasone valerate (VALISONE) 0.1 % cream APPLY TO AFFECTED AREA TWICE A DAY  . Fluticasone-Salmeterol (ADVAIR DISKUS) 250-50 MCG/DOSE AEPB Inhale 1 puff into the lungs in the morning and at bedtime.  . gabapentin (NEURONTIN) 300 MG capsule Take 300 mg by mouth 2 (two) times daily.  Marland Kitchen levocetirizine (XYZAL) 5 MG tablet Take 1 tablet (5 mg total) by mouth every evening.  Marland Kitchen levothyroxine (SYNTHROID) 150 MCG tablet TAKE 1 & 1/2 TABLETS BY MOUTH DAILY BEFORE BREAKFAST  . LORazepam (ATIVAN) 0.5 MG tablet Take 0.5 mg by mouth every 8 (eight) hours as needed.   Marland Kitchen NAPROXEN DR 500 MG EC tablet TAKE 1 TABLET (500 MG TOTAL) BY MOUTH 2 (TWO) TIMES DAILY WITH A MEAL.  . nitroGLYCERIN (NITROSTAT) 0.4 MG SL tablet Place 1 tablet (0.4 mg total) under the tongue every 5 (five) minutes as needed for chest pain.  Marland Kitchen omeprazole (PRILOSEC) 40 MG capsule Take 1  capsule (40 mg total) by mouth daily.  . ondansetron (ZOFRAN-ODT) 4 MG disintegrating tablet Take 1 tablet (4 mg total) by mouth every 8 (eight) hours as needed for nausea or vomiting.  . promethazine-dextromethorphan (PROMETHAZINE-DM) 6.25-15 MG/5ML syrup Take 5 mLs by mouth 4 (four) times daily as needed.  . rizatriptan (MAXALT) 10 MG tablet Take by mouth.  . temazepam (RESTORIL) 15 MG capsule Take 1 to 2 caps at night as needed (dose increased, may take up to 30mg )  . topiramate (TOPAMAX) 100 MG tablet Take 100 mg by mouth 2 (two) times daily.  . valACYclovir (VALTREX) 500 MG tablet Take 2 tabs daily for 5 days when you have an outbreak.     Allergies:   Iodinated diagnostic agents, Kenalog [triamcinolone acetonide], Metrizamide, Other, Montelukast sodium, Penicillins, Ciprofloxacin, Diazepam, and Sulfa antibiotics   Social History   Socioeconomic History  . Marital  status: Divorced    Spouse name: Not on file  . Number of children: Not on file  . Years of education: Not on file  . Highest education level: Not on file  Occupational History  . Not on file  Tobacco Use  . Smoking status: Never Smoker  . Smokeless tobacco: Never Used  Substance and Sexual Activity  . Alcohol use: Yes  . Drug use: No  . Sexual activity: Never    Partners: Male  Other Topics Concern  . Not on file  Social History Narrative  . Not on file   Social Determinants of Health   Financial Resource Strain:   . Difficulty of Paying Living Expenses:   Food Insecurity:   . Worried About Charity fundraiser in the Last Year:   . Arboriculturist in the Last Year:   Transportation Needs:   . Film/video editor (Medical):   Marland Kitchen Lack of Transportation (Non-Medical):   Physical Activity:   . Days of Exercise per Week:   . Minutes of Exercise per Session:   Stress:   . Feeling of Stress :   Social Connections:   . Frequency of Communication with Friends and Family:   . Frequency of Social Gatherings  with Friends and Family:   . Attends Religious Services:   . Active Member of Clubs or Organizations:   . Attends Archivist Meetings:   Marland Kitchen Marital Status:      Family History: The patient's family history includes Alzheimer's disease in her mother; Angina in her father; Asthma in her father; Cancer in her sister; Emphysema in her father.  ROS:   Please see the history of present illness.    All other systems reviewed and are negative.  EKGs/Labs/Other Studies Reviewed:    The following studies were reviewed today: EKG reveals sinus rhythm and nonspecific ST-T changes in the emergency room records were reviewed   Recent Labs: 07/27/2019: ALT 16; B Natriuretic Peptide 45.5; BUN 10; Creatinine, Ser 0.63; Hemoglobin 13.4; Platelets 262; Potassium 3.5; Sodium 132  Recent Lipid Panel No results found for: CHOL, TRIG, HDL, CHOLHDL, VLDL, LDLCALC, LDLDIRECT  Physical Exam:    VS:  BP 128/72   Pulse 74   Ht 5' 6.5" (1.689 m)   Wt 148 lb (67.1 kg)   SpO2 97%   BMI 23.53 kg/m     Wt Readings from Last 3 Encounters:  07/29/19 148 lb (67.1 kg)  07/27/19 142 lb (64.4 kg)  06/10/19 143 lb (64.9 kg)     GEN: Patient is in no acute distress HEENT: Normal NECK: No JVD; No carotid bruits LYMPHATICS: No lymphadenopathy CARDIAC: Hear sounds regular, 2/6 systolic murmur at the apex. RESPIRATORY:  Clear to auscultation without rales, wheezing or rhonchi  ABDOMEN: Soft, non-tender, non-distended MUSCULOSKELETAL:  No edema; No deformity  SKIN: Warm and dry NEUROLOGIC:  Alert and oriented x 3 PSYCHIATRIC:  Normal affect   Signed, Jenna Lindau, MD  07/29/2019 9:00 AM    [facility]

## 2019-07-29 NOTE — Progress Notes (Signed)
Chief Complaint  Patient presents with  . Hospitalization Follow-up    Subjective: Patient is a 69 y.o. female here for ED f/u.  Patient went to the emergency department on 3/14 for chest pain.  Work-up was largely unremarkable and she saw her cardiologist today.  CT coronaries ordered.  She is not having any current chest pain or shortness of breath.  She was found to have hyponatremia in the emergency department.  Reports eating and drinking normally.  No dietary changes.  Patient has been feeling more fatigued lately.  She has a family history of pheochromocytoma.  She reports that she was told in the emergency department that she would need to have a urine test to check up on this.  Blood pressure has not been abnormal, she denies skin changes, and is not having any palpitations.  Past Medical History:  Diagnosis Date  . Adjustment disorder with anxious mood 04/21/2015  . Allergic rhinitis due to pollen 07/23/2014  . Alopecia 10/30/2013  . Arthritis 04/24/2016  . Atrophic vaginitis 10/30/2013  . Basal cell carcinoma 04/21/2015  . Cancer ([place])    skin cancer  . Cervical radiculopathy 09/17/2015  . Change in bowel function 06/14/2016  . Chronic diarrhea 12/16/2015  . Cystocele, midline 10/30/2013  . Dermatitis 09/22/2014  . Diverticulosis of large intestine 10/30/2013  . Dyspareunia 07/23/2014  . Essential hypertension   . Family history of pheochromocytoma 02/14/2016  . GERD (gastroesophageal reflux disease)   . Herpes simplex 08/17/2016  . History of adenomatous polyp of colon 06/14/2016  . History of blood transfusion 1970  . Hypothyroidism 10/30/2013  . Hypotonic bladder 10/30/2013  . Lumbar paraspinal muscle spasm 04/21/2015  . Major depression, recurrent ([place]) 07/14/2013  . Malaise and fatigue 04/21/2015  . MCI (mild cognitive impairment) 08/18/2015  . Menopause 03/31/2014  . Migraine   . Mild persistent asthma 05/13/2015  . Neuropathy 09/22/2014  . Personality disorder ([place]) 10/19/2012  .  Pharyngoesophageal dysphagia 04/21/2015  . Plantar fasciitis 01/20/2016  . Precordial chest pain 08/18/2015  . Prolapse of vaginal vault after hysterectomy 10/30/2013  . Rectocele 10/30/2013  . Sinusitis, chronic 10/30/2013  . Slow transit constipation 07/23/2014  . Swelling of lower limb 05/17/2016  . Tension-type headache, not intractable 09/17/2015  . Urethral stricture 10/30/2013  . Urinary incontinence without sensory awareness 07/17/2016  . Varicose veins of both lower extremities 04/24/2016  . Venous insufficiency (chronic) (peripheral) 06/06/2016    Objective: BP 124/78 (BP Location: Left Arm, Patient Position: Sitting, Cuff Size: Normal)   Pulse 78   Temp (!) 96.8 F (36 C) (Temporal)   Ht 5' 6.5" (1.689 m)   Wt 149 lb 8 oz (67.8 kg)   SpO2 96%   BMI 23.77 kg/m  General: Awake, appears stated age Heart: RRR, no bruits, no lower extremity edema Lungs: CTAB, no rales, wheezes or rhonchi. No accessory muscle use Psych:  normal affect and mood  Assessment and Plan: Hyponatremia - Plan: Comprehensive metabolic panel  Family history of pheochromocytoma - Plan: Metanephrines, Urine, 24 hour  Follow-up on hyponatremia.  If still irregular, will have her come back for serum testing and urine tests as well. We will check urine metanephrines to screen for pheochromocytoma. The patient voiced understanding and agreement to the plan.  [place], DO 07/29/19  12:19 PM

## 2019-07-29 NOTE — Patient Instructions (Addendum)
Give us 2-3 business days to get the results of your labs back.   Keep the diet clean and stay active.  Let us know if you need anything. 

## 2019-07-30 ENCOUNTER — Other Ambulatory Visit: Payer: Self-pay | Admitting: Family Medicine

## 2019-07-30 DIAGNOSIS — E876 Hypokalemia: Secondary | ICD-10-CM

## 2019-07-30 DIAGNOSIS — F332 Major depressive disorder, recurrent severe without psychotic features: Secondary | ICD-10-CM | POA: Diagnosis not present

## 2019-07-30 NOTE — Progress Notes (Signed)
b

## 2019-07-31 ENCOUNTER — Other Ambulatory Visit: Payer: Self-pay

## 2019-07-31 ENCOUNTER — Other Ambulatory Visit (INDEPENDENT_AMBULATORY_CARE_PROVIDER_SITE_OTHER): Payer: PPO

## 2019-07-31 DIAGNOSIS — Z8489 Family history of other specified conditions: Secondary | ICD-10-CM | POA: Diagnosis not present

## 2019-08-04 ENCOUNTER — Other Ambulatory Visit (INDEPENDENT_AMBULATORY_CARE_PROVIDER_SITE_OTHER): Payer: PPO

## 2019-08-04 ENCOUNTER — Other Ambulatory Visit: Payer: Self-pay

## 2019-08-04 ENCOUNTER — Ambulatory Visit (HOSPITAL_BASED_OUTPATIENT_CLINIC_OR_DEPARTMENT_OTHER)
Admission: RE | Admit: 2019-08-04 | Discharge: 2019-08-04 | Disposition: A | Discharge status: Home / Self Care | Payer: PPO | Source: Ambulatory Visit | Attending: Cardiology | Admitting: Cardiology

## 2019-08-04 DIAGNOSIS — M79605 Pain in left leg: Secondary | ICD-10-CM | POA: Diagnosis not present

## 2019-08-04 DIAGNOSIS — E876 Hypokalemia: Secondary | ICD-10-CM

## 2019-08-04 DIAGNOSIS — M79604 Pain in right leg: Secondary | ICD-10-CM | POA: Diagnosis not present

## 2019-08-04 DIAGNOSIS — R600 Localized edema: Secondary | ICD-10-CM | POA: Diagnosis not present

## 2019-08-04 LAB — BASIC METABOLIC PANEL
BUN: 9 mg/dL (ref 6–23)
CO2: 24 mEq/L (ref 19–32)
Calcium: 9.2 mg/dL (ref 8.4–10.5)
Chloride: 104 mEq/L (ref 96–112)
Creatinine, Ser: 0.75 mg/dL (ref 0.40–1.20)
GFR: 76.51 mL/min (ref >60.00)
Glucose, Bld: 78 mg/dL (ref 70–99)
Potassium: 3.7 mEq/L (ref 3.5–5.1)
Sodium: 136 mEq/L (ref 135–145)

## 2019-08-04 LAB — METANEPHRINES, URINE, 24 HOUR
Metaneph Total, Ur: 419 mcg/24 h (ref 224–832)
Metanephrines, Ur: 74 mcg/24 h — ABNORMAL LOW (ref 90–315)
Normetanephrine, 24H Ur: 345 mcg/24 h (ref 122–676)
Volume, Urine-VMAUR: 1400 mL

## 2019-08-04 NOTE — Progress Notes (Signed)
VAS [place] LOWER EXTREMITY VENOUS BILAT (DVT)    08/04/19 Cardell Peach RDCS,RVT

## 2019-08-13 ENCOUNTER — Telehealth: Payer: Self-pay | Admitting: Cardiology

## 2019-08-13 NOTE — Telephone Encounter (Signed)
Spoke with patient is regards to CT. She asked what type of CT is being performed and pricing. I advised her to reach out to her insurance and the hospital for those answers and advised her of what type of CT it is.

## 2019-08-14 ENCOUNTER — Other Ambulatory Visit: Payer: Self-pay

## 2019-08-14 ENCOUNTER — Encounter: Payer: Self-pay | Admitting: Family Medicine

## 2019-08-14 ENCOUNTER — Ambulatory Visit (INDEPENDENT_AMBULATORY_CARE_PROVIDER_SITE_OTHER): Payer: PPO | Admitting: Family Medicine

## 2019-08-14 VITALS — BP 129/78 | HR 67

## 2019-08-14 DIAGNOSIS — J029 Acute pharyngitis, unspecified: Secondary | ICD-10-CM | POA: Diagnosis not present

## 2019-08-14 DIAGNOSIS — T7840XD Allergy, unspecified, subsequent encounter: Secondary | ICD-10-CM | POA: Diagnosis not present

## 2019-08-14 DIAGNOSIS — J069 Acute upper respiratory infection, unspecified: Secondary | ICD-10-CM

## 2019-08-14 MED ORDER — AZITHROMYCIN 250 MG PO TABS: ORAL_TABLET | ORAL | 0 refills | Status: DC

## 2019-08-14 MED ORDER — PROMETHAZINE-DM 6.25-15 MG/5ML PO SYRP: 5.0000 mL | ORAL_SOLUTION | Freq: Four times a day (QID) | ORAL | 0 refills | Status: DC | PRN

## 2019-08-14 MED ORDER — AZELASTINE HCL 0.1 % NA SOLN: 2.0000 | Freq: Two times a day (BID) | NASAL | 12 refills | Status: DC

## 2019-08-14 MED ORDER — LEVOCETIRIZINE DIHYDROCHLORIDE 5 MG PO TABS: 5.0000 mg | ORAL_TABLET | Freq: Every evening | ORAL | 2 refills | Status: DC

## 2019-08-14 NOTE — Progress Notes (Signed)
Virtual Visit via Video Note  I connected with Jenna Campbell on 08/14/19 at 11:20 AM EDT by a video enabled telemedicine application and verified that I am speaking with the correct person using two identifiers.  Location: Patient: home alone  Provider: office    I discussed the limitations of evaluation and management by telemedicine and the availability of in person appointments. The patient expressed understanding and agreed to proceed.  History of Present Illness: Pt is home c/o 2 day hx cough and congestion   No fever   + r ear pain esp when she lays on it  + clear mucus Ears are draining esp L  No otc meds  Observations/Objective: There were no vitals filed for this visit.  Vitals:   08/14/19 1134  BP: 129/78  Pulse: 67    No fever   Assessment and Plan: 1. Viral upper respiratory tract infection Pt to get covid test Refill cough meds and astelin  Refill xyzal  If no better Monday we can get her an app with resp clinic next week  - azithromycin (ZITHROMAX Z-PAK) 250 MG tablet; As directed  Dispense: 6 each; Refill: 0 - levocetirizine (XYZAL) 5 MG tablet; Take 1 tablet (5 mg total) by mouth every evening.  Dispense: 30 tablet; Refill: 2  2. Allergy, subsequent encounter   - azelastine (ASTELIN) 0.1 % nasal spray; Place 2 sprays into both nostrils 2 (two) times daily. Use in each nostril as directed  Dispense: 30 mL; Refill: 12  3. Sore throat   - promethazine-dextromethorphan (PROMETHAZINE-DM) 6.25-15 MG/5ML syrup; Take 5 mLs by mouth 4 (four) times daily as needed.  Dispense: 118 mL; Refill: 0  Follow Up Instructions: t plan with the patient. The patient was provided an opportunity to ask questions and all were answered. The patient agreed with the plan and demonstrated an understanding of the instructions.   The patient was advised to call back or seek an in-person evaluation if the symptoms worsen or if the condition fails to improve as anticipated.  I  provided 30 minutes of non-face-to-face time during this encounter.   Ann Held, DO

## 2019-08-22 DIAGNOSIS — F4322 Adjustment disorder with anxiety: Secondary | ICD-10-CM | POA: Diagnosis not present

## 2019-08-22 DIAGNOSIS — F449 Dissociative and conversion disorder, unspecified: Secondary | ICD-10-CM | POA: Diagnosis not present

## 2019-08-22 DIAGNOSIS — F330 Major depressive disorder, recurrent, mild: Secondary | ICD-10-CM | POA: Diagnosis not present

## 2019-08-22 DIAGNOSIS — Z79899 Other long term (current) drug therapy: Secondary | ICD-10-CM | POA: Diagnosis not present

## 2019-08-28 ENCOUNTER — Ambulatory Visit (HOSPITAL_COMMUNITY): Payer: PPO

## 2019-08-29 ENCOUNTER — Telehealth: Payer: Self-pay

## 2019-08-29 NOTE — Telephone Encounter (Signed)
Patient called in to speak with DR. Wendling nurse please follow up with the patient at [phone] thanks,

## 2019-08-29 NOTE — Telephone Encounter (Signed)
Called left message to call back 

## 2019-08-29 NOTE — Telephone Encounter (Signed)
Called and scheduled with other provider for Monday 09/01/19 for UTI/bladder pressure and itching. She verbalized understanding/that if anything changes/worsens to go to an UC or ED.

## 2019-09-01 ENCOUNTER — Ambulatory Visit (INDEPENDENT_AMBULATORY_CARE_PROVIDER_SITE_OTHER): Payer: PPO | Admitting: Medical

## 2019-09-01 ENCOUNTER — Other Ambulatory Visit: Payer: Self-pay

## 2019-09-01 DIAGNOSIS — M720 Palmar fascial fibromatosis [Dupuytren]: Secondary | ICD-10-CM | POA: Diagnosis not present

## 2019-09-01 DIAGNOSIS — R300 Dysuria: Secondary | ICD-10-CM | POA: Diagnosis not present

## 2019-09-01 LAB — POC URINALSYSI DIPSTICK (AUTOMATED)
Bilirubin, UA: NEGATIVE
Blood, UA: NEGATIVE
Glucose, UA: NEGATIVE
Ketones, UA: NEGATIVE
Nitrite, UA: NEGATIVE
Protein, UA: NEGATIVE
Spec Grav, UA: 1.01 (ref 1.010–1.025)
Urobilinogen, UA: 0.2 E.U./dL
pH, UA: 6 (ref 5.0–8.0)

## 2019-09-01 MED ORDER — OLOPATADINE HCL 0.1 % OP SOLN: 1.0000 [drp] | Freq: Two times a day (BID) | OPHTHALMIC | 1 refills | Status: DC

## 2019-09-01 MED ORDER — NITROFURANTOIN MONOHYD MACRO 100 MG PO CAPS: 100.0000 mg | ORAL_CAPSULE | Freq: Two times a day (BID) | ORAL | 0 refills | Status: DC

## 2019-09-01 MED ORDER — FLUTICASONE PROPIONATE 50 MCG/ACT NA SUSP: 2.0000 | Freq: Every day | NASAL | 1 refills | Status: DC

## 2019-09-01 NOTE — Patient Instructions (Signed)
You appear to have a urinary tract infection. I am prescribing macrobid antibiotic for the probable infection. Hydrate well. I am sending out a urine culture. During the interim if your signs and symptoms worsen rather than improving please notify us. We will notify your when the culture results are back.  For allergies rx patanol, xyzal and flonase.  For hand possible dupuytrens contracture referral to sport [facility].  Follow up in 7 days or as needed.

## 2019-09-01 NOTE — Progress Notes (Addendum)
Subjective:    Patient ID: Jenna Campbell, female    DOB: 1950-06-25, 70 y.o.   MRN: [id number]  HPI  Virtual Visit via Video Note  I connected with Jenna Campbell on 09/12/19 at  8:20 AM EDT by a video enabled telemedicine application and verified that I am speaking with the correct person using two identifiers.  Location: Patient: car Provider: office   I discussed the limitations of evaluation and management by telemedicine and the availability of in person appointments. The patient expressed understanding and agreed to proceed.    Follow Up Instructions:    I discussed the assessment and treatment plan with the patient. The patient was provided an opportunity to ask questions and all were answered. The patient agreed with the plan and demonstrated an understanding of the instructions.   The patient was advised to call back or seek an in-person evaluation if the symptoms worsen or if the condition fails to improve as anticipated.     Mackie Pai, PA-C   Pt has some urinary symptoms. She states pain and burning. Some hesitant flow at times. Pt wonder saw some faint dark spots in urine when she looked at urine. Symptoms for about one week.   Hx of occasional uti about 2 times a year.  Pt states no fever, no chills or sweats.  Pt also notes sneezing, dry cough for several weeks. Itchy eyes and runny nose. Pt has some pnd. Both ears feel blocked.  Pt has xyzal and she notes that this does help when uses.  No wheezing.  Pt has thick tissues on both palms of hands and she states just noticed yesterday. She feels slight restricted hand movement.      Review of Systems  Constitutional: Negative for chills, fatigue and fever.  HENT: Positive for congestion and postnasal drip. Negative for sinus pressure and sinus pain.   Eyes: Positive for itching.  Respiratory: Negative for cough, chest tightness, shortness of breath and wheezing.   Gastrointestinal: Negative for  abdominal pain.  Genitourinary: Positive for dysuria, frequency and urgency.  Musculoskeletal: Negative for back pain.  Neurological: Negative for dizziness, numbness and headaches.  Hematological: Negative for adenopathy. Does not bruise/bleed easily.  Psychiatric/Behavioral: Negative for behavioral problems and confusion.    Past Medical History:  Diagnosis Date  . Adjustment disorder with anxious mood 04/21/2015  . Allergic rhinitis due to pollen 07/23/2014  . Alopecia 10/30/2013  . Arthritis 04/24/2016  . Atrophic vaginitis 10/30/2013  . Basal cell carcinoma 04/21/2015  . Cancer ([place])    skin cancer  . Cervical radiculopathy 09/17/2015  . Change in bowel function 06/14/2016  . Chronic diarrhea 12/16/2015  . Cystocele, midline 10/30/2013  . Dermatitis 09/22/2014  . Diverticulosis of large intestine 10/30/2013  . Dyspareunia 07/23/2014  . Essential hypertension   . Family history of pheochromocytoma 02/14/2016  . GERD (gastroesophageal reflux disease)   . Herpes simplex 08/17/2016  . History of adenomatous polyp of colon 06/14/2016  . History of blood transfusion 1970  . Hypothyroidism 10/30/2013  . Hypotonic bladder 10/30/2013  . Lumbar paraspinal muscle spasm 04/21/2015  . Major depression, recurrent ([place]) 07/14/2013  . Malaise and fatigue 04/21/2015  . MCI (mild cognitive impairment) 08/18/2015  . Menopause 03/31/2014  . Migraine   . Mild persistent asthma 05/13/2015  . Neuropathy 09/22/2014  . Personality disorder ([place]) 10/19/2012  . Pharyngoesophageal dysphagia 04/21/2015  . Plantar fasciitis 01/20/2016  . Precordial chest pain 08/18/2015  . Prolapse of vaginal vault after hysterectomy  10/30/2013  . Rectocele 10/30/2013  . Sinusitis, chronic 10/30/2013  . Slow transit constipation 07/23/2014  . Swelling of lower limb 05/17/2016  . Tension-type headache, not intractable 09/17/2015  . Urethral stricture 10/30/2013  . Urinary incontinence without sensory awareness 07/17/2016  . Varicose veins of both  lower extremities 04/24/2016  . Venous insufficiency (chronic) (peripheral) 06/06/2016     Social History   Socioeconomic History  . Marital status: Divorced    Spouse name: Not on file  . Number of children: Not on file  . Years of education: Not on file  . Highest education level: Not on file  Occupational History  . Not on file  Tobacco Use  . Smoking status: Never Smoker  . Smokeless tobacco: Never Used  Substance and Sexual Activity  . Alcohol use: Yes  . Drug use: No  . Sexual activity: Never    Partners: Male  Other Topics Concern  . Not on file  Social History Narrative  . Not on file   Social Determinants of Health   Financial Resource Strain:   . Difficulty of Paying Living Expenses:   Food Insecurity:   . Worried About Charity fundraiser in the Last Year:   . Arboriculturist in the Last Year:   Transportation Needs:   . Film/video editor (Medical):   Marland Kitchen Lack of Transportation (Non-Medical):   Physical Activity:   . Days of Exercise per Week:   . Minutes of Exercise per Session:   Stress:   . Feeling of Stress :   Social Connections:   . Frequency of Communication with Friends and Family:   . Frequency of Social Gatherings with Friends and Family:   . Attends Religious Services:   . Active Member of Clubs or Organizations:   . Attends Archivist Meetings:   Marland Kitchen Marital Status:   Intimate Partner Violence:   . Fear of Current or Ex-Partner:   . Emotionally Abused:   Marland Kitchen Physically Abused:   . Sexually Abused:     Past Surgical History:  Procedure Laterality Date  . BREAST CYST ASPIRATION Left    25 years ago   . MOHS SURGERY  10/05/2015  . RECTOCELE REPAIR  2011  . SKIN CANCER EXCISION  02/2015   Squamous cell removed from back  . TONSILLECTOMY    . VAGINAL HYSTERECTOMY    . VAGINAL PROLAPSE REPAIR      Family History  Problem Relation Age of Onset  . Asthma Father   . Angina Father   . Emphysema Father   . Alzheimer's  disease Mother   . Cancer Sister        died of cancer    Allergies  Allergen Reactions  . Iodinated Diagnostic Agents Itching, Other (See Comments), Rash and Shortness Of Breath    Throat Swelling, Erythema  . Kenalog [Triamcinolone Acetonide] Swelling  . Metrizamide Itching, Other (See Comments), Rash and Shortness Of Breath    Throat Swelling, Erythema  . Other Itching and Swelling    DUST  . Montelukast Sodium Other (See Comments)    Chest pain, dizziness and lightheadedness  . Penicillins   . Ciprofloxacin Itching  . Diazepam Rash  . Sulfa Antibiotics Rash    Current Outpatient Medications on File Prior to Visit  Medication Sig Dispense Refill  . albuterol (PROAIR HFA) 108 (90 Base) MCG/ACT inhaler Inhale 2 puffs into the lungs every 4 (four) hours as needed for wheezing or shortness of  breath. 18 g 1  . albuterol (PROVENTIL) (2.5 MG/3ML) 0.083% nebulizer solution Take 3 mLs (2.5 mg total) by nebulization every 6 (six) hours as needed for wheezing or shortness of breath. 75 mL 0  . azelastine (ASTELIN) 0.1 % nasal spray Place 2 sprays into both nostrils 2 (two) times daily. Use in each nostril as directed 30 mL 12  . azithromycin (ZITHROMAX Z-PAK) 250 MG tablet As directed 6 each 0  . betamethasone valerate (VALISONE) 0.1 % cream APPLY TO AFFECTED AREA TWICE A DAY 30 g 0  . Fluticasone-Salmeterol (ADVAIR DISKUS) 250-50 MCG/DOSE AEPB Inhale 1 puff into the lungs in the morning and at bedtime. 60 each 8  . gabapentin (NEURONTIN) 300 MG capsule Take 300 mg by mouth 2 (two) times daily.    Marland Kitchen levocetirizine (XYZAL) 5 MG tablet Take 1 tablet (5 mg total) by mouth every evening. 30 tablet 2  . levothyroxine (SYNTHROID) 150 MCG tablet TAKE 1 & 1/2 TABLETS BY MOUTH DAILY BEFORE BREAKFAST 135 tablet 0  . LORazepam (ATIVAN) 0.5 MG tablet Take 0.5 mg by mouth every 8 (eight) hours as needed.     . metoprolol tartrate (LOPRESSOR) 100 MG tablet Take 1 tablet (100 mg total) by mouth once for  1 dose. Take 2 hours prior to your test if your heartbeat is greater than 55. 1 tablet 0  . NAPROXEN DR 500 MG EC tablet TAKE 1 TABLET (500 MG TOTAL) BY MOUTH 2 (TWO) TIMES DAILY WITH A MEAL. 30 tablet 0  . nitroGLYCERIN (NITROSTAT) 0.4 MG SL tablet Place 1 tablet (0.4 mg total) under the tongue every 5 (five) minutes as needed for chest pain. 25 tablet 3  . omeprazole (PRILOSEC) 40 MG capsule Take 1 capsule (40 mg total) by mouth daily. 90 capsule 1  . ondansetron (ZOFRAN-ODT) 4 MG disintegrating tablet Take 1 tablet (4 mg total) by mouth every 8 (eight) hours as needed for nausea or vomiting. 20 tablet 0  . predniSONE (DELTASONE) 50 MG tablet Prednisone 50 mg - take 13 hours prior to test Take another Prednisone 50 mg 7 hours prior to test Take another Prednisone 50 mg 1 hour prior to test Take Benadryl 50 mg 1 hour prior to test 3 tablet 0  . promethazine-dextromethorphan (PROMETHAZINE-DM) 6.25-15 MG/5ML syrup Take 5 mLs by mouth 4 (four) times daily as needed. 118 mL 0  . rizatriptan (MAXALT) 10 MG tablet Take by mouth.    . temazepam (RESTORIL) 15 MG capsule Take 1 to 2 caps at night as needed (dose increased, may take up to 30mg )    . topiramate (TOPAMAX) 100 MG tablet Take 100 mg by mouth 2 (two) times daily.    . valACYclovir (VALTREX) 500 MG tablet Take 2 tabs daily for 5 days when you have an outbreak. 60 tablet 2   No current facility-administered medications on file prior to visit.    There were no vitals taken for this visit.      Objective:   Physical Exam  General-no acute distress, pleasant, oriented. Lungs- on inspection lungs appear unlabored. Neck- no tracheal deviation or jvd on inspection. Neuro- gross motor function appears intact. Hands- on inspection appears might had dupuntures contracture. Thick tissue on inspection.      Assessment & Plan:  You appear to have a urinary tract infection. I am prescribing macrobid antibiotic for the probable infection.  Hydrate well. I am sending out a urine culture. During the interim if your signs and symptoms worsen rather  than improving please notify us. We will notify your when the culture results are back.  For allergies rx patanol, xyzal and flonase.  For hand possible dupuytrens contracture referral to sport med.  Follow up in 7 days or as needed.  Time spent with patient today was  35 minutes which consisted of chart review, discussing diagnosis, work up treatment and documentation.  Pt was in her car during the virtual visit. She was going to come in just to give urine sample since she had allergy type symptoms and we are  doing covid visit precautions.  Mackie Pai, PA-C

## 2019-09-01 NOTE — Addendum Note (Signed)
Addended by: Jeronimo Greaves on: 09/01/2019 09:51 AM   Modules accepted: Orders

## 2019-09-02 ENCOUNTER — Encounter: Payer: Self-pay | Admitting: Medical

## 2019-09-02 LAB — URINE CULTURE
MICRO NUMBER:: 10378813
SPECIMEN QUALITY:: ADEQUATE

## 2019-09-03 ENCOUNTER — Telehealth: Payer: Self-pay | Admitting: Medical

## 2019-09-03 DIAGNOSIS — R300 Dysuria: Secondary | ICD-10-CM

## 2019-09-03 NOTE — Telephone Encounter (Signed)
Referral to urologist placed. 

## 2019-09-04 NOTE — Telephone Encounter (Signed)
THANKS,

## 2019-09-04 NOTE — Telephone Encounter (Signed)
Referral was sent to [facility] via [facility] as Urgent

## 2019-09-11 ENCOUNTER — Ambulatory Visit: Payer: PPO | Admitting: Family Medicine

## 2019-09-17 ENCOUNTER — Other Ambulatory Visit: Payer: Self-pay

## 2019-09-17 ENCOUNTER — Encounter: Payer: Self-pay | Admitting: Family Medicine

## 2019-09-17 ENCOUNTER — Ambulatory Visit: Payer: PPO | Admitting: Family Medicine

## 2019-09-17 VITALS — BP 136/78 | HR 65 | Ht 67.0 in | Wt 145.0 lb

## 2019-09-17 DIAGNOSIS — M720 Palmar fascial fibromatosis [Dupuytren]: Secondary | ICD-10-CM

## 2019-09-17 HISTORY — DX: Palmar fascial fibromatosis (dupuytren): M72.0

## 2019-09-17 NOTE — Patient Instructions (Signed)
Nice to meet you Please try the splints at night Please try the voltaren  Please try to pad the handles or make the handles bigger  Please send me a message in [place] with any questions or updates.  Please see me back in 4 weeks   Dupuytren's Contracture Dupuytren's contracture is a condition in which tissue under the skin of the palm becomes thick. This causes one or more of the fingers to curl inward (contract) toward the palm. After a while, the fingers may not be able to straighten out. This condition affects some or all of the fingers and the palm of the hand. This condition may affect one or both hands. Dupuytren's contracture is a long-term (chronic) condition that develops (progresses) slowly over time. There is no cure, but symptoms can be managed and progression can be slowed with treatment. This condition is usually not dangerous or painful, but it can interfere with everyday tasks. What are the causes?  This condition is caused by tissue (fascia) in the palm that gets thicker and tighter. When the fascia thickens, it pulls on the cords of tissue (tendons) that control finger movement. This causes the fingers to contract. The cause of fascia thickening is not known. However, the condition is often passed along from parent to child (inherited). What increases the risk? The following factors may make you more likely to develop this condition:  Being 27 years of age or older.  Being female.  Having a family history of this condition.  Using tobacco products, including cigarettes, chewing tobacco, and e-cigarettes.  Drinking alcohol excessively.  Having diabetes.  Having a seizure disorder. What are the signs or symptoms? Early symptoms of this condition may include:  Thick, puckered skin on the hand.  One or more lumps (nodules) on the palm. Nodules may be tender when they first appear, but they are generally painless. Later symptoms of this condition may include:  Thick  cords of tissue in the palm.  Fingers curled up toward the palm.  Inability to straighten the fingers into their normal position. Though this condition is usually painless, you may have discomfort when holding or grabbing objects. How is this diagnosed? This condition is diagnosed with a physical exam, which may include:  Looking at your hands and feeling your palms. This is to check for thickened fascia and nodules.  Measuring finger motion.  Doing the [place] tabletop test. You may be asked to try to put your hand on a surface, with your palm down and your fingers straight out. How is this treated? There is no cure for this condition, but treatment can relieve discomfort and make symptoms more manageable. Treatment options may include:  Physical therapy. This can strengthen your hand and increase flexibility.  Occupational therapy. This can help you with everyday tasks that may be more difficult because of your condition.  Shots (injections). Substances may be injected into your hand, such as: ? Medicines that help to decrease swelling (corticosteroids). ? Proteins (collagenase) to weaken thick tissue. After a collagenase injection, your health care provider may stretch your fingers.  Needle aponeurotomy. A needle is pushed through the skin and into the fascia. Moving the needle against the fascia can weaken or break up the thick tissue.  Surgery. This may be needed if your condition causes discomfort or interferes with everyday activities. Physical therapy is usually needed after surgery. No treatment is guaranteed to cure this condition. Recurrence of symptoms is common. Follow these instructions at home: Hand care  Take these actions to help protect your hand from possible injury: ? Use tools that have padded grips. ? Wear protective gloves while you work with your hands. ? Avoid repetitive hand movements. General instructions  Take over-the-counter and prescription  medicines only as told by your health care provider.  Manage any other conditions that you have, such as diabetes.  If physical therapy was prescribed, do exercises as told by your health care provider.  Do not use any products that contain nicotine or tobacco, such as cigarettes, e-cigarettes, and chewing tobacco. If you need help quitting, ask your health care provider.  If you drink alcohol: ? Limit how much you use to:  0-1 drink a day for women.  0-2 drinks a day for men. ? Be aware of how much alcohol is in your drink. In the [place] one drink equals one 12 oz bottle of beer (355 mL), one 5 oz glass of wine (148 mL), or one 1 oz glass of hard liquor (44 mL).  Keep all follow-up visits as told by your health care provider. This is important. Contact a health care provider if:  You develop new symptoms, or your symptoms get worse.  You have pain that gets worse or does not get better with medicine.  You have difficulty or discomfort with everyday tasks.  You develop numbness or tingling. Get help right away if:  You have severe pain.  Your fingers change color or become unusually cold. Summary  Dupuytren's contracture is a condition in which tissue under the skin of the palm becomes thick.  This condition is caused by tissue (fascia) that thickens. When it thickens, it pulls on the cords of tissue (tendons) that control finger movement and makes the fingers to contract.  You are more likely to develop this condition if you are a man, are over 42 years of age, have a family history of the condition, and drink a lot of alcohol.  This condition can be treated with physical and occupational therapy, injections, and surgery.  Follow instructions about how to care for your hand. Get help right away if you have severe pain or your fingers change color or become cold. This information is not intended to replace advice given to you by your health care provider. Make sure you  discuss any questions you have with your health care provider. Document Revised: 11/20/2017 Document Reviewed: 11/20/2017 Elsevier Patient Education  2020 [employer]. .   --Dr. Raeford Razor

## 2019-09-17 NOTE — Assessment & Plan Note (Signed)
Occurring in both hands.  Works with her hands through the course the day that likely is exacerbating her symptoms. -Counseled on supportive care. -Provided splint that she can wear at night. -Voltaren. -Counseled on softening grips or making them larger on things that she uses around the house. -If no improvement can consider injection.

## 2019-09-17 NOTE — Progress Notes (Signed)
Jenna Campbell - 69 y.o. female MRN [id number]  Date of birth: Oct 16, 1950  SUBJECTIVE:  Including CC & ROS.  Chief Complaint  Patient presents with  . Hand Pain    bilateral x 2 weeks    Jenna Campbell is a 69 y.o. female that is presenting with left and right hand pain.  The pain is ongoing for the past few weeks.  She notices the pain worse when she uses her hands.  Is occurring over the flexor surface of the fourth palmar aspect.  No inciting event or trauma.   Review of Systems See HPI   HISTORY: Past Medical, Surgical, Social, and Family History Reviewed & Updated per EMR.   Pertinent Historical Findings include:  Past Medical History:  Diagnosis Date  . Adjustment disorder with anxious mood 04/21/2015  . Allergic rhinitis due to pollen 07/23/2014  . Alopecia 10/30/2013  . Arthritis 04/24/2016  . Atrophic vaginitis 10/30/2013  . Basal cell carcinoma 04/21/2015  . Cancer ([place])    skin cancer  . Cervical radiculopathy 09/17/2015  . Change in bowel function 06/14/2016  . Chronic diarrhea 12/16/2015  . Cystocele, midline 10/30/2013  . Dermatitis 09/22/2014  . Diverticulosis of large intestine 10/30/2013  . Dyspareunia 07/23/2014  . Essential hypertension   . Family history of pheochromocytoma 02/14/2016  . GERD (gastroesophageal reflux disease)   . Herpes simplex 08/17/2016  . History of adenomatous polyp of colon 06/14/2016  . History of blood transfusion 1970  . Hypothyroidism 10/30/2013  . Hypotonic bladder 10/30/2013  . Lumbar paraspinal muscle spasm 04/21/2015  . Major depression, recurrent ([place]) 07/14/2013  . Malaise and fatigue 04/21/2015  . MCI (mild cognitive impairment) 08/18/2015  . Menopause 03/31/2014  . Migraine   . Mild persistent asthma 05/13/2015  . Neuropathy 09/22/2014  . Personality disorder ([place]) 10/19/2012  . Pharyngoesophageal dysphagia 04/21/2015  . Plantar fasciitis 01/20/2016  . Precordial chest pain 08/18/2015  . Prolapse of vaginal vault after hysterectomy 10/30/2013    . Rectocele 10/30/2013  . Sinusitis, chronic 10/30/2013  . Slow transit constipation 07/23/2014  . Swelling of lower limb 05/17/2016  . Tension-type headache, not intractable 09/17/2015  . Urethral stricture 10/30/2013  . Urinary incontinence without sensory awareness 07/17/2016  . Varicose veins of both lower extremities 04/24/2016  . Venous insufficiency (chronic) (peripheral) 06/06/2016    Past Surgical History:  Procedure Laterality Date  . BREAST CYST ASPIRATION Left    25 years ago   . MOHS SURGERY  10/05/2015  . RECTOCELE REPAIR  2011  . SKIN CANCER EXCISION  02/2015   Squamous cell removed from back  . TONSILLECTOMY    . VAGINAL HYSTERECTOMY    . VAGINAL PROLAPSE REPAIR      Family History  Problem Relation Age of Onset  . Asthma Father   . Angina Father   . Emphysema Father   . Alzheimer's disease Mother   . Cancer Sister        died of cancer    Social History   Socioeconomic History  . Marital status: Divorced    Spouse name: Not on file  . Number of children: Not on file  . Years of education: Not on file  . Highest education level: Not on file  Occupational History  . Not on file  Tobacco Use  . Smoking status: Never Smoker  . Smokeless tobacco: Never Used  Substance and Sexual Activity  . Alcohol use: Yes  . Drug use: No  . Sexual activity: Never  Partners: Male  Other Topics Concern  . Not on file  Social History Narrative  . Not on file   Social Determinants of Health   Financial Resource Strain:   . Difficulty of Paying Living Expenses:   Food Insecurity:   . Worried About Charity fundraiser in the Last Year:   . Arboriculturist in the Last Year:   Transportation Needs:   . Film/video editor (Medical):   Marland Kitchen Lack of Transportation (Non-Medical):   Physical Activity:   . Days of Exercise per Week:   . Minutes of Exercise per Session:   Stress:   . Feeling of Stress :   Social Connections:   . Frequency of Communication with Friends  and Family:   . Frequency of Social Gatherings with Friends and Family:   . Attends Religious Services:   . Active Member of Clubs or Organizations:   . Attends Archivist Meetings:   Marland Kitchen Marital Status:   Intimate Partner Violence:   . Fear of Current or Ex-Partner:   . Emotionally Abused:   Marland Kitchen Physically Abused:   . Sexually Abused:      PHYSICAL EXAM:  VS: BP 136/78   Pulse 65   Ht 5\' 7"  (1.702 m)   Wt 145 lb (65.8 kg)   BMI 22.71 kg/m  Physical Exam Gen: NAD, alert, cooperative with exam, well-appearing MSK:  Left and right hand: Nodules appreciated in the palmar aspect leading up to the ring finger on the left and right hand. No streaking. No triggering of the fingers. No malrotation or misalignment. Neurovascularly intact     ASSESSMENT & PLAN:   Dupuytren's contracture of both hands Occurring in both hands.  Works with her hands through the course the day that likely is exacerbating her symptoms. -Counseled on supportive care. -Provided splint that she can wear at night. -Voltaren. -Counseled on softening grips or making them larger on things that she uses around the house. -If no improvement can consider injection.

## 2019-10-02 ENCOUNTER — Telehealth: Payer: Self-pay | Admitting: Cardiology

## 2019-10-02 NOTE — Telephone Encounter (Signed)
    Pt is calling she said she has CT on Monday and she is allergic to dye, she told Dr. Geraldo Pitter about it and said he will send a prescription for her to take prior CT. She doesn't know what medication and her pharmacy said they haven't receive any prescription. Her pharmacy is [facility] [address] - [place], [place] - [address] or [phone] [place]

## 2019-10-03 ENCOUNTER — Other Ambulatory Visit: Payer: Self-pay

## 2019-10-03 ENCOUNTER — Other Ambulatory Visit (HOSPITAL_COMMUNITY): Payer: Self-pay | Admitting: *Deleted

## 2019-10-03 ENCOUNTER — Telehealth (HOSPITAL_COMMUNITY): Payer: Self-pay | Admitting: *Deleted

## 2019-10-03 DIAGNOSIS — R0789 Other chest pain: Secondary | ICD-10-CM | POA: Diagnosis not present

## 2019-10-03 DIAGNOSIS — I209 Angina pectoris, unspecified: Secondary | ICD-10-CM | POA: Diagnosis not present

## 2019-10-03 MED ORDER — METOPROLOL TARTRATE 100 MG PO TABS: 100.0000 mg | ORAL_TABLET | Freq: Once | ORAL | 0 refills | Status: DC

## 2019-10-03 MED ORDER — PREDNISONE 50 MG PO TABS: ORAL_TABLET | ORAL | 0 refills | Status: DC

## 2019-10-03 NOTE — Telephone Encounter (Signed)
Pt returning call regarding upcoming cardiac imaging study; pt verbalizes understanding of appt date/time, parking situation and where to check in, pre-test NPO status and medications ordered, and verified current allergies; name and call back number provided for further questions should they arise  Jenna Campbell Tai RN Navigator Cardiac Imaging  Heart and Vascular [phone] office [phone] cell  

## 2019-10-03 NOTE — Telephone Encounter (Signed)
Attempted to call patient regarding upcoming cardiac CT appointment. Left message on voicemail with name and callback number  Yudit Modesitt Tai RN Navigator Cardiac Imaging [facility] [phone] Office [phone] Cell  

## 2019-10-03 NOTE — Telephone Encounter (Signed)
Message left for pt to call back for questions or concerns. Message left that the Rx had been sent to [facility] once again.

## 2019-10-04 LAB — BASIC METABOLIC PANEL
BUN/Creatinine Ratio: 20 (ref 12–28)
BUN: 14 mg/dL (ref 8–27)
CO2: 20 mmol/L (ref 20–29)
Calcium: 8.8 mg/dL (ref 8.7–10.3)
Chloride: 105 mmol/L (ref 96–106)
Creatinine, Ser: 0.71 mg/dL (ref 0.57–1.00)
GFR calc Af Amer: 100 mL/min/{1.73_m2} (ref >59)
GFR calc non Af Amer: 87 mL/min/{1.73_m2} (ref >59)
Glucose: 79 mg/dL (ref 65–99)
Potassium: 4.1 mmol/L (ref 3.5–5.2)
Sodium: 139 mmol/L (ref 134–144)

## 2019-10-06 ENCOUNTER — Ambulatory Visit (HOSPITAL_COMMUNITY)
Admission: RE | Admit: 2019-10-06 | Discharge: 2019-10-06 | Disposition: A | Discharge status: Home / Self Care | Payer: PPO | Source: Ambulatory Visit | Attending: Cardiology | Admitting: Cardiology

## 2019-10-06 ENCOUNTER — Other Ambulatory Visit: Payer: Self-pay

## 2019-10-06 ENCOUNTER — Telehealth: Payer: Self-pay

## 2019-10-06 DIAGNOSIS — I209 Angina pectoris, unspecified: Secondary | ICD-10-CM | POA: Insufficient documentation

## 2019-10-06 DIAGNOSIS — I2510 Atherosclerotic heart disease of native coronary artery without angina pectoris: Secondary | ICD-10-CM | POA: Diagnosis not present

## 2019-10-06 IMAGING — CT CT HEART MORP W/ CTA COR W/ SCORE W/ CA W/CM &/OR W/O CM
4 of 7 series · 8 of 20 positions shown, 9 images · IV contrast (APPLIED)
Comparison: None.
COMPARISON: None.

Addendum:
EXAM:
OVER-READ INTERPRETATION  CT CHEST

The following report is an over-read performed by radiologist Dr.
Poseidontourism Robakidze [[email or web address]] on 10/06/2019. This over-read
does not include interpretation of cardiac or coronary anatomy or
pathology. The coronary CTA interpretation by the cardiologist is
attached.
CLINICAL DATA: 68 yo female with chest pain
Cardiac/Coronary  CT
TECHNIQUE: The patient was scanned on a [employer] Force scanner.

[Series 7: best diast 74 % · axial · 0.31mm/px · z∈[-217,-161]mm · 2 of 422 slices shown, 3 images]
[im 141/422  vessel]
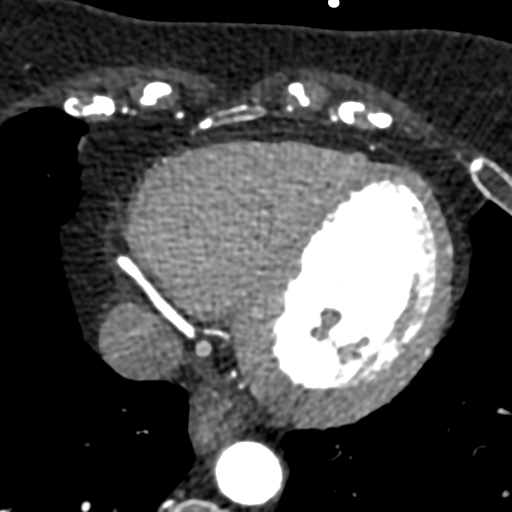
[im 141/422  lung]
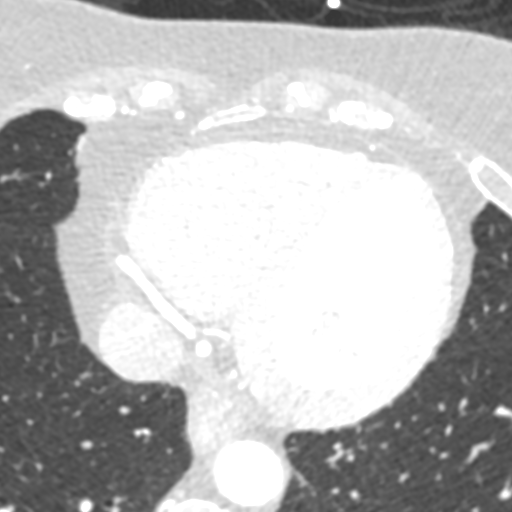
[im 281/422  vessel]
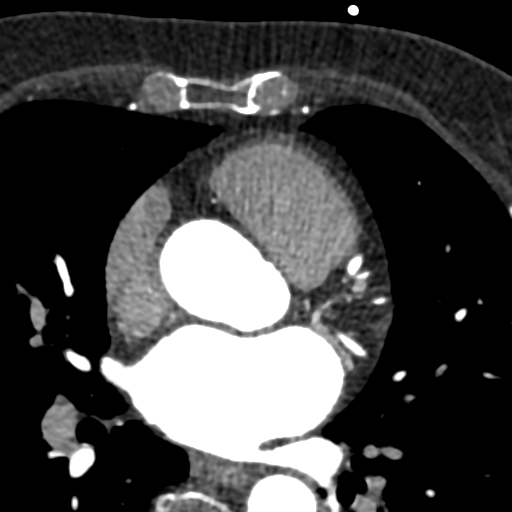

[Series 8: best syst 32 % · axial · 0.31mm/px · z∈[-217,-161]mm · 2 of 422 slices shown]
[im 141/422  vessel]
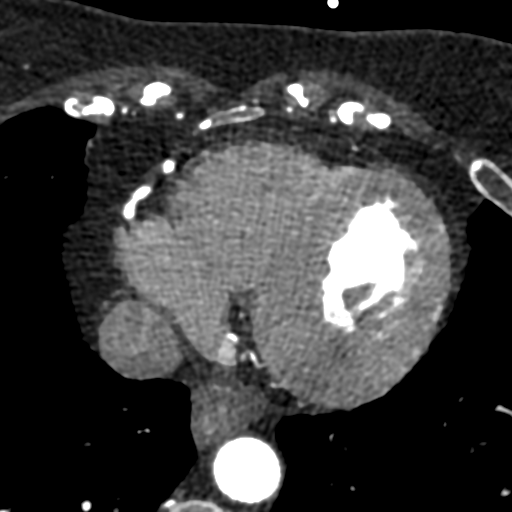
[im 281/422  vessel]
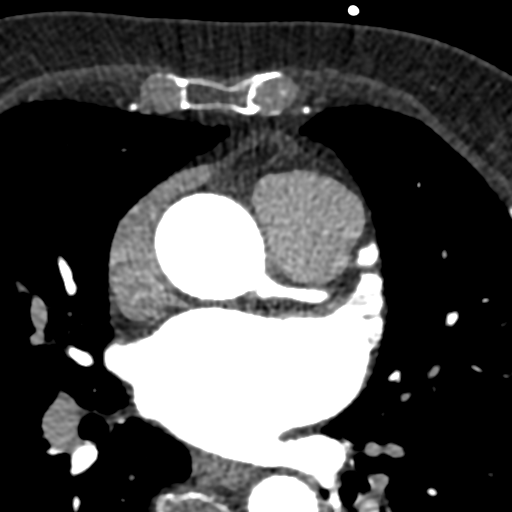

[Series 9: ts diast sharp 74 % · axial · 0.31mm/px · z∈[-217,-161]mm · 2 of 422 slices shown]
[im 141/422  lung]
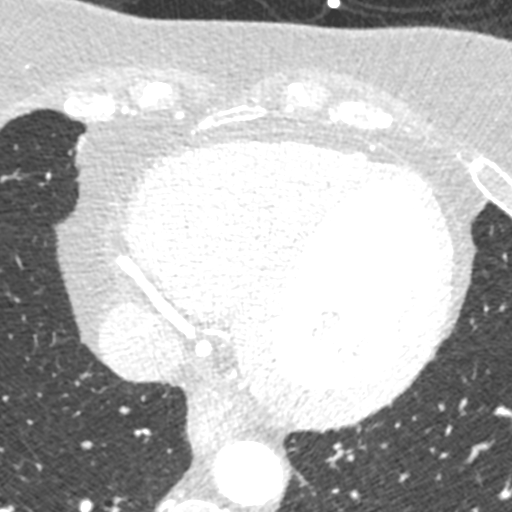
[im 281/422  lung]
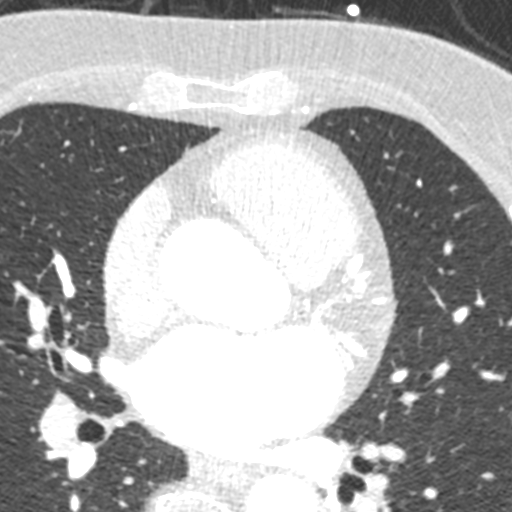

[Series 10: ts syst sharp 32 % · axial · 0.31mm/px · z∈[-217,-161]mm · 2 of 422 slices shown]
[im 141/422  lung]
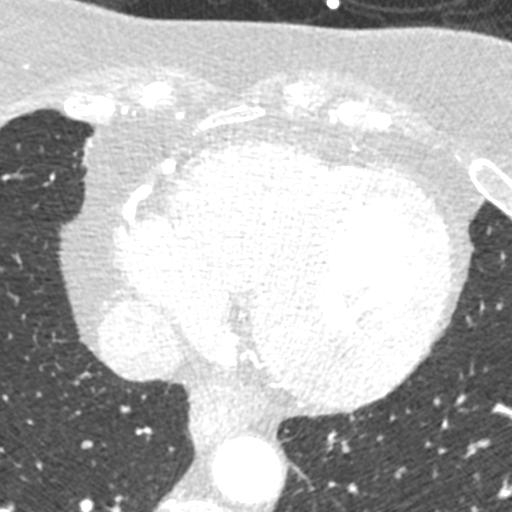
[im 281/422  lung]
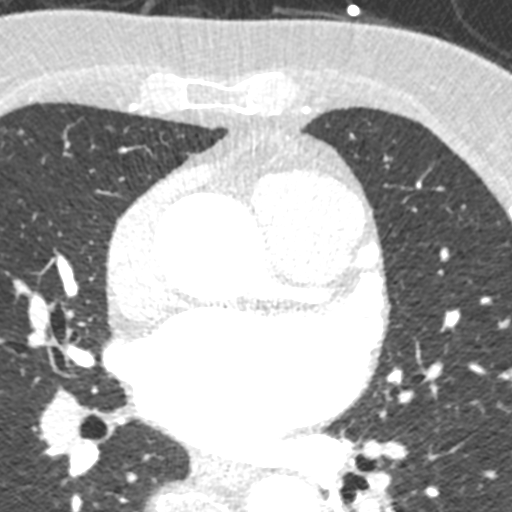

[8 of 20 positions shown; findings below may reference images not displayed]

FINDINGS: Vascular: Heart is normal size.  Aorta normal caliber.

Mediastinum/Nodes: No adenopathy

Lungs/Pleura: No confluent opacities or effusions.

Upper Abdomen: Imaging into the upper abdomen shows no acute
findings.

Musculoskeletal: Chest wall soft tissues are unremarkable. No acute
bony abnormality.
IMPRESSION: No acute or significant extracardiac abnormality.
FINDINGS: A 120 kV prospective scan was triggered in the descending thoracic
aorta at 111 HU's. Axial non-contrast 3 mm slices were carried out
through the heart. The data set was analyzed on a dedicated work
station and scored using the Agatson method. Gantry rotation speed
was 250 msecs and collimation was .6 mm. No beta blockade and 0.8 mg
of sl NTG was given. The 3D data set was reconstructed in 5%
intervals of the 67-82 % of the R-R cycle. Diastolic phases were
analyzed on a dedicated work station using MPR, MIP and VRT modes.
The patient received 80 cc of contrast.

Aorta:  Normal size.  Mild aortic plaque.  No dissection.

Aortic Valve:  Trileaflet.  No calcifications.

Coronary Arteries:  Normal coronary origin.  Right dominance.

RCA is a large dominant artery that gives rise to PDA. There is no
plaque.

Left main is a large artery that gives rise to LAD and LCX arteries.

LAD is a large vessel that gives rise to medium D1 and D2 and small
D3; there is severe (70-99%) stenosis (soft plaque) in the proximal
vessel.

LCX is a non-dominant artery that gives rise to large OM1 branch and
large OM2 branch. There is no plaque.

Other findings:

Normal pulmonary vein drainage into the left atrium.

Normal let atrial appendage without a thrombus.

Normal size of the pulmonary artery.
IMPRESSION: 1. Coronary calcium score of 0. This was 0 percentile for age and
sex matched control.

2. Normal coronary origin with right dominance.

3. Severe (70-99%) stenosis (soft plaque) in the proximal LAD;
CAD-RADS 4. Study will be sent for FFR.

4. Results called to Dr Tiger.

Es Tiger

*** End of Addendum ***
EXAM:
OVER-READ INTERPRETATION  CT CHEST

The following report is an over-read performed by radiologist Dr.
Poseidontourism Robakidze [REDACTED] on 10/06/2019. This over-read
does not include interpretation of cardiac or coronary anatomy or
pathology. The coronary CTA interpretation by the cardiologist is
attached.
FINDINGS: Vascular: Heart is normal size.  Aorta normal caliber.

Mediastinum/Nodes: No adenopathy

Lungs/Pleura: No confluent opacities or effusions.

Upper Abdomen: Imaging into the upper abdomen shows no acute
findings.

Musculoskeletal: Chest wall soft tissues are unremarkable. No acute
bony abnormality.
IMPRESSION: No acute or significant extracardiac abnormality.

## 2019-10-06 MED ORDER — NITROGLYCERIN 0.4 MG SL SUBL: 0.8000 mg | SUBLINGUAL_TABLET | Freq: Once | SUBLINGUAL | Status: AC

## 2019-10-06 MED ORDER — IOHEXOL 350 MG/ML SOLN
80.0000 mL | Freq: Once | INTRAVENOUS | Status: AC | PRN
  Administered 2019-10-06: 80 mL via INTRAVENOUS

## 2019-10-06 MED ORDER — NITROGLYCERIN 0.4 MG SL SUBL
SUBLINGUAL_TABLET | SUBLINGUAL | Status: AC
  Administered 2019-10-06: 0.8 mg via SUBLINGUAL
  Filled 2019-10-06: qty 2

## 2019-10-06 NOTE — Telephone Encounter (Signed)
Called to notify pt that her CT FFR was abnormal per Dr. Geraldo Pitter. Appointment scheduled for 10/08/19.

## 2019-10-07 DIAGNOSIS — I2510 Atherosclerotic heart disease of native coronary artery without angina pectoris: Secondary | ICD-10-CM | POA: Diagnosis not present

## 2019-10-08 ENCOUNTER — Encounter: Payer: Self-pay | Admitting: Cardiology

## 2019-10-08 ENCOUNTER — Inpatient Hospital Stay (HOSPITAL_COMMUNITY): Admission: RE | Admit: 2019-10-08 | Payer: PPO | Source: Ambulatory Visit

## 2019-10-08 ENCOUNTER — Ambulatory Visit: Payer: PPO | Admitting: Cardiology

## 2019-10-08 ENCOUNTER — Other Ambulatory Visit: Payer: Self-pay

## 2019-10-08 ENCOUNTER — Telehealth: Payer: Self-pay | Admitting: Cardiology

## 2019-10-08 VITALS — BP 126/84 | HR 72 | Ht 67.0 in | Wt 150.4 lb

## 2019-10-08 DIAGNOSIS — I10 Essential (primary) hypertension: Secondary | ICD-10-CM

## 2019-10-08 DIAGNOSIS — I209 Angina pectoris, unspecified: Secondary | ICD-10-CM

## 2019-10-08 DIAGNOSIS — Z79899 Other long term (current) drug therapy: Secondary | ICD-10-CM

## 2019-10-08 MED ORDER — PREDNISONE 50 MG PO TABS
ORAL_TABLET | ORAL | 0 refills | Status: DC
Start: 2019-10-08 — End: 2019-11-12

## 2019-10-08 NOTE — Progress Notes (Signed)
Cardiology Office Note:    Date:  10/08/2019   ID:  Jenna Campbell, DOB [birth year], MRN [id number]  PCP:  Shelda Pal, DO  Cardiologist:  Jenean Lindau, MD   Referring MD: Shelda Pal*    ASSESSMENT:    1. Angina pectoris ([place])   2. Essential hypertension    PLAN:    In order of problems listed above:  1. Coronary artery disease: Angina pectoris: Abnormal CT FFR with significant proximal LAD stenosis noted: I discussed my findings with the patient at extensive length she and her daughter had multiple questions which were answered to her satisfaction.  Her symptoms are concerning and in view of this following recommendations were done.I discussed coronary angiography and left heart catheterization with the patient at extensive length. Procedure, benefits and potential risks were explained. Patient had multiple questions which were answered to the patient's satisfaction. Patient agreed and consented for the procedure. Further recommendations will be made based on the findings of the coronary angiography. In the interim. The patient has any significant symptoms he knows to go to the nearest emergency room. 2. Essential hypertension: Blood pressure stable.  Patient was advised to take coated baby aspirin on a daily basis 3. I will do fasting blood work in the morning and initiate her on atorvastatin 20 mg daily in view of soft plaque in the proximal LAD. 4. Patient and family had multiple questions which were answered to her satisfaction.   Medication Adjustments/Labs and Tests Ordered: Current medicines are reviewed at length with the patient today.  Concerns regarding medicines are outlined above.  No orders of the defined types were placed in this encounter.  No orders of the defined types were placed in this encounter.    No chief complaint on file.    History of Present Illness:    Jenna Campbell is a 69 y.o. female.  Patient was evaluated by me for  angina pectoris.  She is a poor historian.  She has history of essential hypertension and CT coronary angiography revealed proximal significant left anterior descending stenosis the report of which is mentioned below in detail.  Patient is here for follow-up and her daughter is very supportive and accompanies her.  She denies any orthopnea or PND.  At the time of my evaluation, the patient is alert awake oriented and in no distress.  Past Medical History:  Diagnosis Date  . Acute bronchitis due to other specified organisms 04/21/2015  . Acute tension-type headache 12/16/2015  . Adjustment disorder with anxious mood 04/21/2015  . Allergic asthma with acute exacerbation 09/22/2014  . Allergic rhinitis due to pollen 07/23/2014  . Allergic urticaria 05/13/2015  . Allergy   . Alopecia 10/30/2013  . Angina pectoris ([place]) 07/29/2019  . Arthritis 04/24/2016  . Asthma   . Atrophic vaginitis 10/30/2013  . Basal cell carcinoma 04/21/2015  . Bilateral impacted cerumen 04/21/2015  . Cancer ([place])    skin cancer  . Cervical radiculopathy 09/17/2015  . Change in bowel function 06/14/2016  . Chest pain in adult 05/28/2017  . Chronic diarrhea 12/16/2015  . Colon polyps   . Cough 02/14/2016  . Cystocele, midline 10/30/2013  . Dermatitis 09/22/2014  . 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 Diverticulosis of large intestine 10/30/2013  . Dupuytren's contracture of both hands 09/17/2019  . Dyspareunia 07/23/2014  . Dysuria 12/16/2015  . Encounter for long-term (current) use of other medications 10/19/2012  . Esophageal dysphagia 06/14/2016  . Essential hypertension   . Family history  of pheochromocytoma 02/14/2016  . Gastroesophageal reflux disease without esophagitis 05/13/2015  . Generalized abdominal pain 12/16/2015  . GERD (gastroesophageal reflux disease)   . H/O: hysterectomy   . Herpes simplex 08/17/2016  . History of adenomatous polyp of colon 06/14/2016  . History of blood transfusion 1970  . Hx of migraines 05/15/2009  .  Hypothyroidism 10/30/2013  . Hypotonic bladder 10/30/2013  . Localized edema 12/16/2015  . Low back pain 09/17/2015  . Lumbar paraspinal muscle spasm 04/21/2015  . Major depression, recurrent ([place]) 07/14/2013  . Malaise and fatigue 04/21/2015  . MCI (mild cognitive impairment) 08/18/2015  . Memory difficulty 09/17/2015  . Menopause 03/31/2014  . Midline low back pain without sciatica 03/31/2014  . Migraine   . Mild intermittent extrinsic asthma 12/16/2015  . Mild persistent asthma 05/13/2015  . Neck pain 09/17/2015  . Neuropathy 09/22/2014  . Nonintractable headache 11/02/2016  . Other allergic rhinitis 05/13/2015  . Pelvic pain in female 02/17/2015  . Personality disorder ([place]) 10/19/2012  . Pharyngoesophageal dysphagia 04/21/2015  . Plantar fasciitis 01/20/2016  . Precordial chest pain 08/18/2015  . Prolapse of vaginal vault after hysterectomy 10/30/2013  . Rectocele 10/30/2013  . Shin splint 04/21/2015  . Shortness of breath 07/17/2016  . Sinusitis, chronic 10/30/2013  . Slow transit constipation 07/23/2014  . Swelling of lower limb 05/17/2016  . Tension-type headache, not intractable 09/17/2015  . Thyroid disease   . Urethral stricture 10/30/2013  . Urinary incontinence   . Urinary incontinence without sensory awareness 07/17/2016  . Varicose veins of both lower extremities 04/24/2016  . Venous insufficiency (chronic) (peripheral) 06/06/2016    Past Surgical History:  Procedure Laterality Date  . BREAST CYST ASPIRATION Left    25 years ago   . MOHS SURGERY  10/05/2015  . RECTOCELE REPAIR  2011  . SKIN CANCER EXCISION  02/2015   Squamous cell removed from back  . TONSILLECTOMY    . VAGINAL HYSTERECTOMY    . VAGINAL PROLAPSE REPAIR      Current Medications: Current Meds  Medication Sig  . albuterol (PROAIR HFA) 108 (90 Base) MCG/ACT inhaler Inhale 2 puffs into the lungs every 4 (four) hours as needed for wheezing or shortness of breath.  Marland Kitchen albuterol (PROVENTIL) (2.5 MG/3ML) 0.083% nebulizer solution  Take 3 mLs (2.5 mg total) by nebulization every 6 (six) hours as needed for wheezing or shortness of breath.  Marland Kitchen azelastine (ASTELIN) 0.1 % nasal spray Place 2 sprays into both nostrils 2 (two) times daily. Use in each nostril as directed  . betamethasone valerate (VALISONE) 0.1 % cream APPLY TO AFFECTED AREA TWICE A DAY  . fluticasone (FLONASE) 50 MCG/ACT nasal spray Place 2 sprays into both nostrils daily.  . Fluticasone-Salmeterol (ADVAIR DISKUS) 250-50 MCG/DOSE AEPB Inhale 1 puff into the lungs in the morning and at bedtime.  . gabapentin (NEURONTIN) 300 MG capsule Take 300 mg by mouth 2 (two) times daily.  Marland Kitchen levocetirizine (XYZAL) 5 MG tablet Take 1 tablet (5 mg total) by mouth every evening.  Marland Kitchen levothyroxine (SYNTHROID) 150 MCG tablet TAKE 1 & 1/2 TABLETS BY MOUTH DAILY BEFORE BREAKFAST  . LORazepam (ATIVAN) 0.5 MG tablet Take 0.5 mg by mouth every 8 (eight) hours as needed.   . nitroGLYCERIN (NITROSTAT) 0.4 MG SL tablet Place 1 tablet (0.4 mg total) under the tongue every 5 (five) minutes as needed for chest pain.  Marland Kitchen olopatadine (PATANOL) 0.1 % ophthalmic solution Place 1 drop into both eyes 2 (two) times daily.  Marland Kitchen  omeprazole (PRILOSEC) 40 MG capsule Take 1 capsule (40 mg total) by mouth daily.  . ondansetron (ZOFRAN-ODT) 4 MG disintegrating tablet Take 1 tablet (4 mg total) by mouth every 8 (eight) hours as needed for nausea or vomiting.  . promethazine-dextromethorphan (PROMETHAZINE-DM) 6.25-15 MG/5ML syrup Take 5 mLs by mouth 4 (four) times daily as needed.  . temazepam (RESTORIL) 15 MG capsule Take 1 to 2 caps at night as needed (dose increased, may take up to 30mg )  . topiramate (TOPAMAX) 100 MG tablet Take 100 mg by mouth 2 (two) times daily.  . valACYclovir (VALTREX) 500 MG tablet Take 2 tabs daily for 5 days when you have an outbreak.     Allergies:   Iodinated diagnostic agents, Kenalog [triamcinolone acetonide], Metrizamide, Other, Montelukast sodium, Penicillins, Ciprofloxacin,  Diazepam, and Sulfa antibiotics   Social History   Socioeconomic History  . Marital status: Divorced    Spouse name: Not on file  . Number of children: Not on file  . Years of education: Not on file  . Highest education level: Not on file  Occupational History  . Not on file  Tobacco Use  . Smoking status: Never Smoker  . Smokeless tobacco: Never Used  Substance and Sexual Activity  . Alcohol use: Yes  . Drug use: No  . Sexual activity: Never    Partners: Male  Other Topics Concern  . Not on file  Social History Narrative  . Not on file   Social Determinants of Health   Financial Resource Strain:   . Difficulty of Paying Living Expenses:   Food Insecurity:   . Worried About Charity fundraiser in the Last Year:   . Arboriculturist in the Last Year:   Transportation Needs:   . Film/video editor (Medical):   Marland Kitchen Lack of Transportation (Non-Medical):   Physical Activity:   . Days of Exercise per Week:   . Minutes of Exercise per Session:   Stress:   . Feeling of Stress :   Social Connections:   . Frequency of Communication with Friends and Family:   . Frequency of Social Gatherings with Friends and Family:   . Attends Religious Services:   . Active Member of Clubs or Organizations:   . Attends Archivist Meetings:   Marland Kitchen Marital Status:      Family History: The patient's family history includes Alzheimer's disease in her mother; Angina in her father; Asthma in her father; Cancer in her sister; Emphysema in her father.  ROS:   Please see the history of present illness.    All other systems reviewed and are negative.  EKGs/Labs/Other Studies Reviewed:    The following studies were reviewed today: Summary:  RIGHT:  - There is no evidence of superficial venous thrombosis.  - There is no evidence of deep vein thrombosis in the lower extremity.    - No cystic structure found in the popliteal fossa.    LEFT:  - There is no evidence of deep vein  thrombosis in the lower extremity.  - There is no evidence of superficial venous thrombosis.    - No cystic structure found in the popliteal fossa.    *See table(s) above for measurements and observations.   Electronically signed by Jenne Campus MD on 08/05/2019 at 12:55:21 PM.    IMPRESSION: 1. Coronary calcium score of 0. This was 0 percentile for age and sex matched control.  2. Normal coronary origin with right dominance.  3. Severe (  70-99%) stenosis (soft plaque) in the proximal LAD; CAD-RADS 4. Study will be sent for FFR.  4. Results called to Dr Geraldo Pitter.  Kirk Ruths  Recent Labs: 07/27/2019: B Natriuretic Peptide 45.5; Hemoglobin 13.4; Platelets 262 07/29/2019: ALT 12 10/03/2019: BUN 14; Creatinine, Ser 0.71; Potassium 4.1; Sodium 139  Recent Lipid Panel No results found for: CHOL, TRIG, HDL, CHOLHDL, VLDL, LDLCALC, LDLDIRECT  Physical Exam:    VS:  BP 126/84   Pulse 72   Ht 5\' 7"  (1.702 m)   Wt 150 lb 6.4 oz (68.2 kg)   SpO2 97%   BMI 23.56 kg/m     Wt Readings from Last 3 Encounters:  10/08/19 150 lb 6.4 oz (68.2 kg)  09/17/19 145 lb (65.8 kg)  07/29/19 149 lb 8 oz (67.8 kg)     GEN: Patient is in no acute distress HEENT: Normal NECK: No JVD; No carotid bruits LYMPHATICS: No lymphadenopathy CARDIAC: Hear sounds regular, 2/6 systolic murmur at the apex. RESPIRATORY:  Clear to auscultation without rales, wheezing or rhonchi  ABDOMEN: Soft, non-tender, non-distended MUSCULOSKELETAL:  No edema; No deformity  SKIN: Warm and dry NEUROLOGIC:  Alert and oriented x 3 PSYCHIATRIC:  Normal affect   Signed, Jenean Lindau, MD  10/08/2019 1:32 PM    [facility]

## 2019-10-08 NOTE — Telephone Encounter (Signed)
Left patient a detailed message letting her know that they will allow one person in the waiting room to wait on her during the procedure. I also left her our call back number to call back if she has any other questions or concerns.    Encouraged patient to call back with any questions or concerns.

## 2019-10-08 NOTE — H&P (View-Only) (Signed)
Cardiology Office Note:    Date:  10/08/2019   ID:  Jenna Campbell, DOB [birth year], MRN [id number]  PCP:  Shelda Pal, DO  Cardiologist:  Jenean Lindau, MD   Referring MD: Shelda Pal*    ASSESSMENT:    1. Angina pectoris ([place])   2. Essential hypertension    PLAN:    In order of problems listed above:  1. Coronary artery disease: Angina pectoris: Abnormal CT FFR with significant proximal LAD stenosis noted: I discussed my findings with the patient at extensive length she and her daughter had multiple questions which were answered to her satisfaction.  Her symptoms are concerning and in view of this following recommendations were done.I discussed coronary angiography and left heart catheterization with the patient at extensive length. Procedure, benefits and potential risks were explained. Patient had multiple questions which were answered to the patient's satisfaction. Patient agreed and consented for the procedure. Further recommendations will be made based on the findings of the coronary angiography. In the interim. The patient has any significant symptoms he knows to go to the nearest emergency room. 2. Essential hypertension: Blood pressure stable.  Patient was advised to take coated baby aspirin on a daily basis 3. I will do fasting blood work in the morning and initiate her on atorvastatin 20 mg daily in view of soft plaque in the proximal LAD. 4. Patient and family had multiple questions which were answered to her satisfaction.   Medication Adjustments/Labs and Tests Ordered: Current medicines are reviewed at length with the patient today.  Concerns regarding medicines are outlined above.  No orders of the defined types were placed in this encounter.  No orders of the defined types were placed in this encounter.    No chief complaint on file.    History of Present Illness:    Jenna Campbell is a 69 y.o. female.  Patient was evaluated by me for  angina pectoris.  She is a poor historian.  She has history of essential hypertension and CT coronary angiography revealed proximal significant left anterior descending stenosis the report of which is mentioned below in detail.  Patient is here for follow-up and her daughter is very supportive and accompanies her.  She denies any orthopnea or PND.  At the time of my evaluation, the patient is alert awake oriented and in no distress.  Past Medical History:  Diagnosis Date  . Acute bronchitis due to other specified organisms 04/21/2015  . Acute tension-type headache 12/16/2015  . Adjustment disorder with anxious mood 04/21/2015  . Allergic asthma with acute exacerbation 09/22/2014  . Allergic rhinitis due to pollen 07/23/2014  . Allergic urticaria 05/13/2015  . Allergy   . Alopecia 10/30/2013  . Angina pectoris ([place]) 07/29/2019  . Arthritis 04/24/2016  . Asthma   . Atrophic vaginitis 10/30/2013  . Basal cell carcinoma 04/21/2015  . Bilateral impacted cerumen 04/21/2015  . Cancer ([place])    skin cancer  . Cervical radiculopathy 09/17/2015  . Change in bowel function 06/14/2016  . Chest pain in adult 05/28/2017  . Chronic diarrhea 12/16/2015  . Colon polyps   . Cough 02/14/2016  . Cystocele, midline 10/30/2013  . Dermatitis 09/22/2014  . 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 Diverticulosis of large intestine 10/30/2013  . Dupuytren's contracture of both hands 09/17/2019  . Dyspareunia 07/23/2014  . Dysuria 12/16/2015  . Encounter for long-term (current) use of other medications 10/19/2012  . Esophageal dysphagia 06/14/2016  . Essential hypertension   . Family history  of pheochromocytoma 02/14/2016  . Gastroesophageal reflux disease without esophagitis 05/13/2015  . Generalized abdominal pain 12/16/2015  . GERD (gastroesophageal reflux disease)   . H/O: hysterectomy   . Herpes simplex 08/17/2016  . History of adenomatous polyp of colon 06/14/2016  . History of blood transfusion 1970  . Hx of migraines 05/15/2009  .  Hypothyroidism 10/30/2013  . Hypotonic bladder 10/30/2013  . Localized edema 12/16/2015  . Low back pain 09/17/2015  . Lumbar paraspinal muscle spasm 04/21/2015  . Major depression, recurrent ([place]) 07/14/2013  . Malaise and fatigue 04/21/2015  . MCI (mild cognitive impairment) 08/18/2015  . Memory difficulty 09/17/2015  . Menopause 03/31/2014  . Midline low back pain without sciatica 03/31/2014  . Migraine   . Mild intermittent extrinsic asthma 12/16/2015  . Mild persistent asthma 05/13/2015  . Neck pain 09/17/2015  . Neuropathy 09/22/2014  . Nonintractable headache 11/02/2016  . Other allergic rhinitis 05/13/2015  . Pelvic pain in female 02/17/2015  . Personality disorder ([place]) 10/19/2012  . Pharyngoesophageal dysphagia 04/21/2015  . Plantar fasciitis 01/20/2016  . Precordial chest pain 08/18/2015  . Prolapse of vaginal vault after hysterectomy 10/30/2013  . Rectocele 10/30/2013  . Shin splint 04/21/2015  . Shortness of breath 07/17/2016  . Sinusitis, chronic 10/30/2013  . Slow transit constipation 07/23/2014  . Swelling of lower limb 05/17/2016  . Tension-type headache, not intractable 09/17/2015  . Thyroid disease   . Urethral stricture 10/30/2013  . Urinary incontinence   . Urinary incontinence without sensory awareness 07/17/2016  . Varicose veins of both lower extremities 04/24/2016  . Venous insufficiency (chronic) (peripheral) 06/06/2016    Past Surgical History:  Procedure Laterality Date  . BREAST CYST ASPIRATION Left    25 years ago   . MOHS SURGERY  10/05/2015  . RECTOCELE REPAIR  2011  . SKIN CANCER EXCISION  02/2015   Squamous cell removed from back  . TONSILLECTOMY    . VAGINAL HYSTERECTOMY    . VAGINAL PROLAPSE REPAIR      Current Medications: Current Meds  Medication Sig  . albuterol (PROAIR HFA) 108 (90 Base) MCG/ACT inhaler Inhale 2 puffs into the lungs every 4 (four) hours as needed for wheezing or shortness of breath.  Marland Kitchen albuterol (PROVENTIL) (2.5 MG/3ML) 0.083% nebulizer solution  Take 3 mLs (2.5 mg total) by nebulization every 6 (six) hours as needed for wheezing or shortness of breath.  Marland Kitchen azelastine (ASTELIN) 0.1 % nasal spray Place 2 sprays into both nostrils 2 (two) times daily. Use in each nostril as directed  . betamethasone valerate (VALISONE) 0.1 % cream APPLY TO AFFECTED AREA TWICE A DAY  . fluticasone (FLONASE) 50 MCG/ACT nasal spray Place 2 sprays into both nostrils daily.  . Fluticasone-Salmeterol (ADVAIR DISKUS) 250-50 MCG/DOSE AEPB Inhale 1 puff into the lungs in the morning and at bedtime.  . gabapentin (NEURONTIN) 300 MG capsule Take 300 mg by mouth 2 (two) times daily.  Marland Kitchen levocetirizine (XYZAL) 5 MG tablet Take 1 tablet (5 mg total) by mouth every evening.  Marland Kitchen levothyroxine (SYNTHROID) 150 MCG tablet TAKE 1 & 1/2 TABLETS BY MOUTH DAILY BEFORE BREAKFAST  . LORazepam (ATIVAN) 0.5 MG tablet Take 0.5 mg by mouth every 8 (eight) hours as needed.   . nitroGLYCERIN (NITROSTAT) 0.4 MG SL tablet Place 1 tablet (0.4 mg total) under the tongue every 5 (five) minutes as needed for chest pain.  Marland Kitchen olopatadine (PATANOL) 0.1 % ophthalmic solution Place 1 drop into both eyes 2 (two) times daily.  Marland Kitchen  omeprazole (PRILOSEC) 40 MG capsule Take 1 capsule (40 mg total) by mouth daily.  . ondansetron (ZOFRAN-ODT) 4 MG disintegrating tablet Take 1 tablet (4 mg total) by mouth every 8 (eight) hours as needed for nausea or vomiting.  . promethazine-dextromethorphan (PROMETHAZINE-DM) 6.25-15 MG/5ML syrup Take 5 mLs by mouth 4 (four) times daily as needed.  . temazepam (RESTORIL) 15 MG capsule Take 1 to 2 caps at night as needed (dose increased, may take up to 30mg )  . topiramate (TOPAMAX) 100 MG tablet Take 100 mg by mouth 2 (two) times daily.  . valACYclovir (VALTREX) 500 MG tablet Take 2 tabs daily for 5 days when you have an outbreak.     Allergies:   Iodinated diagnostic agents, Kenalog [triamcinolone acetonide], Metrizamide, Other, Montelukast sodium, Penicillins, Ciprofloxacin,  Diazepam, and Sulfa antibiotics   Social History   Socioeconomic History  . Marital status: Divorced    Spouse name: Not on file  . Number of children: Not on file  . Years of education: Not on file  . Highest education level: Not on file  Occupational History  . Not on file  Tobacco Use  . Smoking status: Never Smoker  . Smokeless tobacco: Never Used  Substance and Sexual Activity  . Alcohol use: Yes  . Drug use: No  . Sexual activity: Never    Partners: Male  Other Topics Concern  . Not on file  Social History Narrative  . Not on file   Social Determinants of Health   Financial Resource Strain:   . Difficulty of Paying Living Expenses:   Food Insecurity:   . Worried About Charity fundraiser in the Last Year:   . Arboriculturist in the Last Year:   Transportation Needs:   . Film/video editor (Medical):   Marland Kitchen Lack of Transportation (Non-Medical):   Physical Activity:   . Days of Exercise per Week:   . Minutes of Exercise per Session:   Stress:   . Feeling of Stress :   Social Connections:   . Frequency of Communication with Friends and Family:   . Frequency of Social Gatherings with Friends and Family:   . Attends Religious Services:   . Active Member of Clubs or Organizations:   . Attends Archivist Meetings:   Marland Kitchen Marital Status:      Family History: The patient's family history includes Alzheimer's disease in her mother; Angina in her father; Asthma in her father; Cancer in her sister; Emphysema in her father.  ROS:   Please see the history of present illness.    All other systems reviewed and are negative.  EKGs/Labs/Other Studies Reviewed:    The following studies were reviewed today: Summary:  RIGHT:  - There is no evidence of superficial venous thrombosis.  - There is no evidence of deep vein thrombosis in the lower extremity.    - No cystic structure found in the popliteal fossa.    LEFT:  - There is no evidence of deep vein  thrombosis in the lower extremity.  - There is no evidence of superficial venous thrombosis.    - No cystic structure found in the popliteal fossa.    *See table(s) above for measurements and observations.   Electronically signed by Jenne Campus MD on 08/05/2019 at 12:55:21 PM.    IMPRESSION: 1. Coronary calcium score of 0. This was 0 percentile for age and sex matched control.  2. Normal coronary origin with right dominance.  3. Severe (  70-99%) stenosis (soft plaque) in the proximal LAD; CAD-RADS 4. Study will be sent for FFR.  4. Results called to Dr Geraldo Pitter.  Kirk Ruths  Recent Labs: 07/27/2019: B Natriuretic Peptide 45.5; Hemoglobin 13.4; Platelets 262 07/29/2019: ALT 12 10/03/2019: BUN 14; Creatinine, Ser 0.71; Potassium 4.1; Sodium 139  Recent Lipid Panel No results found for: CHOL, TRIG, HDL, CHOLHDL, VLDL, LDLCALC, LDLDIRECT  Physical Exam:    VS:  BP 126/84   Pulse 72   Ht 5\' 7"  (1.702 m)   Wt 150 lb 6.4 oz (68.2 kg)   SpO2 97%   BMI 23.56 kg/m     Wt Readings from Last 3 Encounters:  10/08/19 150 lb 6.4 oz (68.2 kg)  09/17/19 145 lb (65.8 kg)  07/29/19 149 lb 8 oz (67.8 kg)     GEN: Patient is in no acute distress HEENT: Normal NECK: No JVD; No carotid bruits LYMPHATICS: No lymphadenopathy CARDIAC: Hear sounds regular, 2/6 systolic murmur at the apex. RESPIRATORY:  Clear to auscultation without rales, wheezing or rhonchi  ABDOMEN: Soft, non-tender, non-distended MUSCULOSKELETAL:  No edema; No deformity  SKIN: Warm and dry NEUROLOGIC:  Alert and oriented x 3 PSYCHIATRIC:  Normal affect   Signed, Jenean Lindau, MD  10/08/2019 1:32 PM    [facility]

## 2019-10-08 NOTE — Telephone Encounter (Signed)
Patient calling in regards to her heart cath. She states she would like to know if her daughter can come with her and wait inside.

## 2019-10-08 NOTE — Patient Instructions (Signed)
Medication Instructions: Your physician has recommended you make the following change in your medication:    Start atorvastatin 20 mg daily.  *If you need a refill on your cardiac medications before your next appointment, please call your pharmacy*   Lab Work: Your physician recommends that you return for lab work in: tomorrow. You need to have labs done when you are fasting.  You can come Monday through Friday 8:30 am to 12:00 pm and 1:15 to 4:30. You do not need to make an appointment as the order has already been placed. The labs you are going to have done are BMET, CBC, TSH, LFT and Lipids.   If you have labs (blood work) drawn today and your tests are completely normal, you will receive your results only by: Marland Kitchen MyChart Message (if you have MyChart) OR . A paper copy in the mail If you have any lab test that is abnormal or we need to change your treatment, we will call you to review the results.   Testing/Procedures:    [place] [address] Dept: [phone] Loc: [phone]  Jenna Campbell  10/08/2019  You are scheduled for a Cardiac Catheterization on Friday, June 4 with Dr. Harrell Gave End.  1. Please arrive at the [facility] (Main Entrance A) at [facility]: [address], [place] [address] at 8:30 AM (This time is two hours before your procedure to ensure your preparation). Free valet parking service is available.   Special note: Every effort is made to have your procedure done on time. Please understand that emergencies sometimes delay scheduled procedures.  2. Diet: Do not eat solid foods after midnight.  The patient may have clear liquids until 5am upon the day of the procedure.  3. Labs: You had your labs done today in the office.  4. Medication instructions in preparation for your procedure:   Contrast Allergy: Yes, Please take Prednisone  50mg  by mouth at: Thirteen hours prior to cath 9:30 pm on Thursday Seven hours prior to cath 3:30 am in Friday And prior to leaving home please take last dose of Prednisone 50mg  and Benadryl 50mg  by mouth.   Current Outpatient Medications (Endocrine & Metabolic):  .  levothyroxine (SYNTHROID) 150 MCG tablet, TAKE 1 & 1/2 TABLETS BY MOUTH DAILY BEFORE BREAKFAST  Current Outpatient Medications (Cardiovascular):  .  nitroGLYCERIN (NITROSTAT) 0.4 MG SL tablet, Place 1 tablet (0.4 mg total) under the tongue every 5 (five) minutes as needed for chest pain.  Current Outpatient Medications (Respiratory):  .  albuterol (PROAIR HFA) 108 (90 Base) MCG/ACT inhaler, Inhale 2 puffs into the lungs every 4 (four) hours as needed for wheezing or shortness of breath. Marland Kitchen  albuterol (PROVENTIL) (2.5 MG/3ML) 0.083% nebulizer solution, Take 3 mLs (2.5 mg total) by nebulization every 6 (six) hours as needed for wheezing or shortness of breath. Marland Kitchen  azelastine (ASTELIN) 0.1 % nasal spray, Place 2 sprays into both nostrils 2 (two) times daily. Use in each nostril as directed .  fluticasone (FLONASE) 50 MCG/ACT nasal spray, Place 2 sprays into both nostrils daily. .  Fluticasone-Salmeterol (ADVAIR DISKUS) 250-50 MCG/DOSE AEPB, Inhale 1 puff into the lungs in the morning and at bedtime. Marland Kitchen  levocetirizine (XYZAL) 5 MG tablet, Take 1 tablet (5 mg total) by mouth every evening. .  promethazine-dextromethorphan (PROMETHAZINE-DM) 6.25-15 MG/5ML syrup, Take 5 mLs by mouth 4 (four) times daily as needed.  Current Outpatient Medications (Other):  .  betamethasone valerate (VALISONE) 0.1 % cream, APPLY TO AFFECTED AREA TWICE A DAY .  gabapentin (NEURONTIN) 300 MG capsule, Take 300 mg by mouth 2 (two) times daily. Marland Kitchen  LORazepam (ATIVAN) 0.5 MG tablet, Take 0.5 mg by mouth every 8 (eight) hours as needed.  Marland Kitchen  olopatadine (PATANOL) 0.1 % ophthalmic solution, Place 1 drop into both eyes 2 (two) times daily. Marland Kitchen  omeprazole  (PRILOSEC) 40 MG capsule, Take 1 capsule (40 mg total) by mouth daily. .  ondansetron (ZOFRAN-ODT) 4 MG disintegrating tablet, Take 1 tablet (4 mg total) by mouth every 8 (eight) hours as needed for nausea or vomiting. .  temazepam (RESTORIL) 15 MG capsule, Take 1 to 2 caps at night as needed (dose increased, may take up to 30mg ) .  topiramate (TOPAMAX) 100 MG tablet, Take 100 mg by mouth 2 (two) times daily. .  valACYclovir (VALTREX) 500 MG tablet, Take 2 tabs daily for 5 days when you have an outbreak. *For reference purposes while preparing patient instructions.   Delete this med list prior to printing instructions for patient.*  Stop taking, Aleve or Naprosyn (Naproxen) Thursday, June 3,   On the morning of your procedure, take your Aspirin and any morning medicines NOT listed above.  You may use sips of water.  5. Plan for one night stay--bring personal belongings. 6. Bring a current list of your medications and current insurance cards. 7. You MUST have a responsible person to drive you home. 8. Someone MUST be with you the first 24 hours after you arrive home or your discharge will be delayed. 9. Please wear clothes that are easy to get on and off and wear slip-on shoes.  Thank you for allowing [place] to care for you!   -- [facility]    Follow-Up: At [facility], you and your health needs are our priority.  As part of our continuing mission to provide you with exceptional heart care, we have created designated Provider Care Teams.  These Care Teams include your primary Cardiologist (physician) and Advanced Practice Providers (APPs -  Physician Assistants and Nurse Practitioners) who all work together to provide you with the care you need, when you need it.  We recommend signing up for the patient portal called "MyChart".  Sign up information is provided on this After Visit Summary.  MyChart is used to connect with patients for Virtual Visits  (Telemedicine).  Patients are able to view lab/test results, encounter notes, upcoming appointments, etc.  Non-urgent messages can be sent to your provider as well.   To learn more about what you can do with MyChart, go to [email or web address].    Your next appointment:   1 month(s)  The format for your next appointment:   In Person  Provider:   Jyl Heinz, MD   Other Instructions NA

## 2019-10-09 DIAGNOSIS — I10 Essential (primary) hypertension: Secondary | ICD-10-CM | POA: Diagnosis not present

## 2019-10-09 DIAGNOSIS — I209 Angina pectoris, unspecified: Secondary | ICD-10-CM | POA: Diagnosis not present

## 2019-10-10 ENCOUNTER — Encounter: Payer: Self-pay | Admitting: Family Medicine

## 2019-10-10 ENCOUNTER — Other Ambulatory Visit (HOSPITAL_COMMUNITY)
Admission: RE | Admit: 2019-10-10 | Discharge: 2019-10-10 | Disposition: A | Discharge status: Home / Self Care | Payer: PPO | Source: Ambulatory Visit | Attending: Family Medicine | Admitting: Family Medicine

## 2019-10-10 ENCOUNTER — Other Ambulatory Visit: Payer: Self-pay

## 2019-10-10 ENCOUNTER — Ambulatory Visit (INDEPENDENT_AMBULATORY_CARE_PROVIDER_SITE_OTHER): Payer: PPO | Admitting: Family Medicine

## 2019-10-10 VITALS — BP 128/80 | HR 74 | Temp 95.2°F | Ht 67.0 in | Wt 148.5 lb

## 2019-10-10 DIAGNOSIS — L989 Disorder of the skin and subcutaneous tissue, unspecified: Secondary | ICD-10-CM

## 2019-10-10 DIAGNOSIS — R300 Dysuria: Secondary | ICD-10-CM | POA: Insufficient documentation

## 2019-10-10 LAB — POC URINALSYSI DIPSTICK (AUTOMATED)
Bilirubin, UA: NEGATIVE
Blood, UA: NEGATIVE
Glucose, UA: NEGATIVE
Ketones, UA: NEGATIVE
Leukocytes, UA: NEGATIVE
Nitrite, UA: NEGATIVE
Protein, UA: NEGATIVE
Spec Grav, UA: 1.01 (ref 1.010–1.025)
Urobilinogen, UA: 0.2 E.U./dL
pH, UA: 7 (ref 5.0–8.0)

## 2019-10-10 LAB — CBC WITH DIFFERENTIAL/PLATELET
Basophils Absolute: 0.1 10*3/uL (ref 0.0–0.2)
Basos: 1 %
EOS (ABSOLUTE): 0.2 10*3/uL (ref 0.0–0.4)
Eos: 4 %
Hematocrit: 40.3 % (ref 34.0–46.6)
Hemoglobin: 13.5 g/dL (ref 11.1–15.9)
Immature Grans (Abs): 0 10*3/uL (ref 0.0–0.1)
Immature Granulocytes: 0 %
Lymphocytes Absolute: 2.2 10*3/uL (ref 0.7–3.1)
Lymphs: 34 %
MCH: 29.7 pg (ref 26.6–33.0)
MCHC: 33.5 g/dL (ref 31.5–35.7)
MCV: 89 fL (ref 79–97)
Monocytes Absolute: 0.7 10*3/uL (ref 0.1–0.9)
Monocytes: 11 %
Neutrophils Absolute: 3.2 10*3/uL (ref 1.4–7.0)
Neutrophils: 50 %
Platelets: 298 10*3/uL (ref 150–450)
RBC: 4.55 x10E6/uL (ref 3.77–5.28)
RDW: 13.2 % (ref 11.7–15.4)
WBC: 6.5 10*3/uL (ref 3.4–10.8)

## 2019-10-10 LAB — HEPATIC FUNCTION PANEL
ALT: 20 IU/L (ref 0–32)
AST: 18 IU/L (ref 0–40)
Albumin: 4 g/dL (ref 3.8–4.8)
Alkaline Phosphatase: 70 IU/L (ref 48–121)
Bilirubin Total: <0.2 mg/dL (ref 0.0–1.2)
Bilirubin, Direct: 0.06 mg/dL (ref 0.00–0.40)
Total Protein: 6.3 g/dL (ref 6.0–8.5)

## 2019-10-10 LAB — BASIC METABOLIC PANEL
BUN/Creatinine Ratio: 15 (ref 12–28)
BUN: 11 mg/dL (ref 8–27)
CO2: 22 mmol/L (ref 20–29)
Calcium: 8.6 mg/dL — ABNORMAL LOW (ref 8.7–10.3)
Chloride: 104 mmol/L (ref 96–106)
Creatinine, Ser: 0.71 mg/dL (ref 0.57–1.00)
GFR calc Af Amer: 100 mL/min/{1.73_m2} (ref >59)
GFR calc non Af Amer: 87 mL/min/{1.73_m2} (ref >59)
Glucose: 90 mg/dL (ref 65–99)
Potassium: 4 mmol/L (ref 3.5–5.2)
Sodium: 137 mmol/L (ref 134–144)

## 2019-10-10 LAB — LIPID PANEL
Chol/HDL Ratio: 5.7 ratio — ABNORMAL HIGH (ref 0.0–4.4)
Cholesterol, Total: 211 mg/dL — ABNORMAL HIGH (ref 100–199)
HDL: 37 mg/dL — ABNORMAL LOW (ref >39)
LDL Chol Calc (NIH): 128 mg/dL — ABNORMAL HIGH (ref 0–99)
Triglycerides: 256 mg/dL — ABNORMAL HIGH (ref 0–149)
VLDL Cholesterol Cal: 46 mg/dL — ABNORMAL HIGH (ref 5–40)

## 2019-10-10 LAB — TSH: TSH: 7.48 u[IU]/mL — ABNORMAL HIGH (ref 0.450–4.500)

## 2019-10-10 NOTE — Progress Notes (Signed)
poct

## 2019-10-10 NOTE — Patient Instructions (Signed)
Stay hydrated.   Warning signs/symptoms: Uncontrollable nausea/vomiting, fevers, worsening symptoms despite treatment, confusion.  Give [place] around 2 business days to get culture back to you.  Try to get your bowels more normal as I think this could be why we are having this issue.  Do not worry about your skin. I did not see anything sinister.   Let us know if you need anything.

## 2019-10-10 NOTE — Progress Notes (Signed)
Chief Complaint  Patient presents with  . Dysuria    urine odor  . Constipation    Jenna Campbell is a 69 y.o. female here for possible UTI.  Duration: 2 weeks. Symptoms: Dysuria, urinary frequency, weak stream Denies: hematuria, urinary hesitancy, urinary retention, fever, nausea, vomiting, flank pain, vaginal discharge Hx of recurrent UTI? No Denies new sexual partners.  She has been constipated. Tried cranberry juice.  Lesion in R groin region that she is concerned about. Has been there for months, unsure exactly how long. No pain or itching. No new topicals or sick contacts. Not sure if it has changed.   Past Medical History:  Diagnosis Date  . Acute bronchitis due to other specified organisms 04/21/2015  . Acute tension-type headache 12/16/2015  . Adjustment disorder with anxious mood 04/21/2015  . Allergic asthma with acute exacerbation 09/22/2014  . Allergic rhinitis due to pollen 07/23/2014  . Allergic urticaria 05/13/2015  . Allergy   . Alopecia 10/30/2013  . Angina pectoris ([place]) 07/29/2019  . Arthritis 04/24/2016  . Asthma   . Atrophic vaginitis 10/30/2013  . Basal cell carcinoma 04/21/2015  . Bilateral impacted cerumen 04/21/2015  . Cancer ([place])    skin cancer  . Cervical radiculopathy 09/17/2015  . Change in bowel function 06/14/2016  . Chest pain in adult 05/28/2017  . Chronic diarrhea 12/16/2015  . Colon polyps   . Cough 02/14/2016  . Cystocele, midline 10/30/2013  . Dermatitis 09/22/2014  . 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 Diverticulosis of large intestine 10/30/2013  . Dupuytren's contracture of both hands 09/17/2019  . Dyspareunia 07/23/2014  . Dysuria 12/16/2015  . Encounter for long-term (current) use of other medications 10/19/2012  . Esophageal dysphagia 06/14/2016  . Essential hypertension   . Family history of pheochromocytoma 02/14/2016  . Gastroesophageal reflux disease without esophagitis 05/13/2015  . Generalized abdominal pain 12/16/2015  . GERD (gastroesophageal  reflux disease)   . H/O: hysterectomy   . Herpes simplex 08/17/2016  . History of adenomatous polyp of colon 06/14/2016  . History of blood transfusion 1970  . Hx of migraines 05/15/2009  . Hypothyroidism 10/30/2013  . Hypotonic bladder 10/30/2013  . Localized edema 12/16/2015  . Low back pain 09/17/2015  . Lumbar paraspinal muscle spasm 04/21/2015  . Major depression, recurrent ([place]) 07/14/2013  . Malaise and fatigue 04/21/2015  . MCI (mild cognitive impairment) 08/18/2015  . Memory difficulty 09/17/2015  . Menopause 03/31/2014  . Midline low back pain without sciatica 03/31/2014  . Migraine   . Mild intermittent extrinsic asthma 12/16/2015  . Mild persistent asthma 05/13/2015  . Neck pain 09/17/2015  . Neuropathy 09/22/2014  . Nonintractable headache 11/02/2016  . Other allergic rhinitis 05/13/2015  . Pelvic pain in female 02/17/2015  . Personality disorder ([place]) 10/19/2012  . Pharyngoesophageal dysphagia 04/21/2015  . Plantar fasciitis 01/20/2016  . Precordial chest pain 08/18/2015  . Prolapse of vaginal vault after hysterectomy 10/30/2013  . Rectocele 10/30/2013  . Shin splint 04/21/2015  . Shortness of breath 07/17/2016  . Sinusitis, chronic 10/30/2013  . Slow transit constipation 07/23/2014  . Swelling of lower limb 05/17/2016  . Tension-type headache, not intractable 09/17/2015  . Thyroid disease   . Urethral stricture 10/30/2013  . Urinary incontinence   . Urinary incontinence without sensory awareness 07/17/2016  . Varicose veins of both lower extremities 04/24/2016  . Venous insufficiency (chronic) (peripheral) 06/06/2016     BP 128/80 (BP Location: Left Arm, Patient Position: Sitting, Cuff Size: Normal)   Pulse 74  Temp (!) 95.2 F (35.1 C) (Temporal)   Ht 5\' 7"  (1.702 m)   Wt 148 lb 8 oz (67.4 kg)   SpO2 97%   BMI 23.26 kg/m  General: Awake, alert, appears stated age Heart: RRR Lungs: CTAB, normal respiratory effort, no accessory muscle usage Abd: BS+, soft, +ttp over suprapubic region, left  moreso than R, ND, no masses or organomegaly Skin: Examined in presence of female chaperone; +hyperpigmented patches on b/l prox thighs. No ext lesions noted otherwise in genital region.   MSK: No CVA tenderness, neg Lloyd's sign Psych: Age appropriate judgment and insight  Dysuria - Plan: POCT Urinalysis Dipstick (Automated), Urine Culture  Skin lesion  I think this may be related to constipation. Will cx and ck for trich/yeast/BV. Stay hydrated. Seek immediate care if pt starts to develop fevers, new/worsening symptoms, uncontrollable N/V. Reassurance given for skin lesions.  F/u prn. The patient voiced understanding and agreement to the plan.  [place], DO 10/10/19 10:49 AM

## 2019-10-11 LAB — URINE CULTURE
MICRO NUMBER:: 10532148
Result:: NO GROWTH
SPECIMEN QUALITY:: ADEQUATE

## 2019-10-14 DIAGNOSIS — F332 Major depressive disorder, recurrent severe without psychotic features: Secondary | ICD-10-CM | POA: Diagnosis not present

## 2019-10-15 ENCOUNTER — Other Ambulatory Visit (HOSPITAL_COMMUNITY)
Admission: RE | Admit: 2019-10-15 | Discharge: 2019-10-15 | Disposition: A | Discharge status: Home / Self Care | Payer: PPO | Source: Ambulatory Visit | Attending: Surgery | Admitting: Surgery

## 2019-10-15 ENCOUNTER — Telehealth: Payer: Self-pay

## 2019-10-15 ENCOUNTER — Encounter: Payer: Self-pay | Admitting: Family Medicine

## 2019-10-15 ENCOUNTER — Ambulatory Visit (INDEPENDENT_AMBULATORY_CARE_PROVIDER_SITE_OTHER): Payer: PPO | Admitting: Family Medicine

## 2019-10-15 ENCOUNTER — Other Ambulatory Visit: Payer: Self-pay

## 2019-10-15 ENCOUNTER — Ambulatory Visit: Payer: PPO | Admitting: Family Medicine

## 2019-10-15 VITALS — BP 110/70 | HR 71 | Temp 95.7°F | Ht 67.0 in | Wt 150.0 lb

## 2019-10-15 DIAGNOSIS — Z20822 Contact with and (suspected) exposure to covid-19: Secondary | ICD-10-CM | POA: Diagnosis not present

## 2019-10-15 DIAGNOSIS — Z01812 Encounter for preprocedural laboratory examination: Secondary | ICD-10-CM | POA: Insufficient documentation

## 2019-10-15 DIAGNOSIS — E039 Hypothyroidism, unspecified: Secondary | ICD-10-CM

## 2019-10-15 DIAGNOSIS — J302 Other seasonal allergic rhinitis: Secondary | ICD-10-CM

## 2019-10-15 DIAGNOSIS — M720 Palmar fascial fibromatosis [Dupuytren]: Secondary | ICD-10-CM | POA: Diagnosis not present

## 2019-10-15 DIAGNOSIS — K219 Gastro-esophageal reflux disease without esophagitis: Secondary | ICD-10-CM | POA: Diagnosis not present

## 2019-10-15 DIAGNOSIS — R079 Chest pain, unspecified: Secondary | ICD-10-CM | POA: Diagnosis not present

## 2019-10-15 HISTORY — DX: Other seasonal allergic rhinitis: J30.2

## 2019-10-15 LAB — SARS CORONAVIRUS 2 (TAT 6-24 HRS): SARS Coronavirus 2: NEGATIVE

## 2019-10-15 MED ORDER — PANTOPRAZOLE SODIUM 40 MG PO TBEC: 40.0000 mg | DELAYED_RELEASE_TABLET | Freq: Every day | ORAL | 3 refills | Status: DC

## 2019-10-15 MED ORDER — ROSUVASTATIN CALCIUM 10 MG PO TABS
10.0000 mg | ORAL_TABLET | Freq: Every day | ORAL | 3 refills | Status: DC
Start: 2019-10-15 — End: 2020-01-14

## 2019-10-15 NOTE — Progress Notes (Signed)
Chief Complaint  Jenna Campbell presents with  . Follow-up    labs from Cardiologist    Subjective: Jenna Campbell is a 69 y.o. female here for f/u.  Hypothyroidism Jenna Campbell presents for follow-up of hypothyroidism.  Reports compliance with medication 225 mcg/d.  No AE's.  +Fatigue.  No other s/s's. Elevated TSH at draw from cardiology team.  GERD Hx of reflux, takes Prilosec 40 mg/d. Does not think it works. Has chronic cough and sore throat. +chronic abd pain.   Past Medical History:  Diagnosis Date  . Acute bronchitis due to other specified organisms 04/21/2015  . Acute tension-type headache 12/16/2015  . Adjustment disorder with anxious mood 04/21/2015  . Allergic asthma with acute exacerbation 09/22/2014  . Allergic rhinitis due to pollen 07/23/2014  . Allergic urticaria 05/13/2015  . Allergy   . Alopecia 10/30/2013  . Angina pectoris ([place]) 07/29/2019  . Arthritis 04/24/2016  . Asthma   . Atrophic vaginitis 10/30/2013  . Basal cell carcinoma 04/21/2015  . Bilateral impacted cerumen 04/21/2015  . Cancer ([place])    skin cancer  . Cervical radiculopathy 09/17/2015  . Change in bowel function 06/14/2016  . Chest pain in adult 05/28/2017  . Chronic diarrhea 12/16/2015  . Colon polyps   . Cough 02/14/2016  . Cystocele, midline 10/30/2013  . Dermatitis 09/22/2014  . 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 Diverticulosis of large intestine 10/30/2013  . Dupuytren's contracture of both hands 09/17/2019  . Dyspareunia 07/23/2014  . Dysuria 12/16/2015  . Encounter for long-term (current) use of other medications 10/19/2012  . Esophageal dysphagia 06/14/2016  . Essential hypertension   . Family history of pheochromocytoma 02/14/2016  . Gastroesophageal reflux disease without esophagitis 05/13/2015  . Generalized abdominal pain 12/16/2015  . GERD (gastroesophageal reflux disease)   . H/O: hysterectomy   . Herpes simplex 08/17/2016  . History of adenomatous polyp of colon 06/14/2016  . History of blood transfusion 1970   . Hx of migraines 05/15/2009  . Hypothyroidism 10/30/2013  . Hypotonic bladder 10/30/2013  . Localized edema 12/16/2015  . Low back pain 09/17/2015  . Lumbar paraspinal muscle spasm 04/21/2015  . Major depression, recurrent ([place]) 07/14/2013  . Malaise and fatigue 04/21/2015  . MCI (mild cognitive impairment) 08/18/2015  . Memory difficulty 09/17/2015  . Menopause 03/31/2014  . Midline low back pain without sciatica 03/31/2014  . Migraine   . Mild intermittent extrinsic asthma 12/16/2015  . Mild persistent asthma 05/13/2015  . Neck pain 09/17/2015  . Neuropathy 09/22/2014  . Nonintractable headache 11/02/2016  . Other allergic rhinitis 05/13/2015  . Pelvic pain in female 02/17/2015  . Personality disorder ([place]) 10/19/2012  . Pharyngoesophageal dysphagia 04/21/2015  . Plantar fasciitis 01/20/2016  . Precordial chest pain 08/18/2015  . Prolapse of vaginal vault after hysterectomy 10/30/2013  . Rectocele 10/30/2013  . Shin splint 04/21/2015  . Shortness of breath 07/17/2016  . Sinusitis, chronic 10/30/2013  . Slow transit constipation 07/23/2014  . Swelling of lower limb 05/17/2016  . Tension-type headache, not intractable 09/17/2015  . Thyroid disease   . Urethral stricture 10/30/2013  . Urinary incontinence   . Urinary incontinence without sensory awareness 07/17/2016  . Varicose veins of both lower extremities 04/24/2016  . Venous insufficiency (chronic) (peripheral) 06/06/2016    Objective: BP 110/70 (BP Location: Left Arm, Jenna Campbell Position: Sitting, Cuff Size: Normal)   Pulse 71   Temp (!) 95.7 F (35.4 C) (Temporal)   Ht 5\' 7"  (1.702 m)   Wt 150 lb (68 kg)   SpO2  99%   BMI 23.49 kg/m  General: Awake, appears stated age Heart: RRR Lungs: CTAB, no rales, wheezes or rhonchi. No accessory muscle use Abd: BS+, S, diffusely ttp that is worse in lower quadrants Psych: normal affect and mood  Assessment and Plan: Hypothyroidism, unspecified type - Plan: TSH, T4, free  Gastroesophageal reflux disease,  unspecified whether esophagitis present - Plan: pantoprazole (PROTONIX) 40 MG tablet  1- Ck above. 2- Change omeprazole to pantoprazole. F/u pending above.  The Jenna Campbell voiced understanding and agreement to the plan.  [place], DO 10/15/19  3:32 PM

## 2019-10-15 NOTE — Telephone Encounter (Signed)
Pt contacted pre-catheterization scheduled at [facility] for: 10/17/19 Verified arrival time and place: [facility]) at: 8:30 am.   No solid food after midnight prior to cath, clear liquids until 5 AM day of procedure. Contrast allergy: yes   Pt verbalized understanding of her Contrast Dye allergy protocol:   (prednisone  50 mg 13, 7 and 1 hour prior to procedure dipenhydramine 50 mg 1 hr prior to procedure)   AM meds can be  taken pre-cath with sip of water including: ASA 81 mg   Confirmed patient has responsible adult to drive home post procedure and observe 24 hours after arriving home:   You are allowed ONE visitor in the waiting room during your procedure. Both you and your visitor must wear masks.      COVID-19 Pre-Screening Questions:  . In the past 7 to 10 days have you had a cough,  shortness of breath, headache, congestion, fever (100 or greater) body aches, chills, sore throat, or sudden loss of taste or sense of smell? NO . Have you been around anyone with known Covid 19 in the past 7 to 10 days? NO . Have you been around anyone who is awaiting Covid 19 test results in the past 7 to 10 days? NO . Have you been around anyone who has mentioned symptoms of Covid 19 within the past 7 to 10 days? NO

## 2019-10-15 NOTE — Patient Instructions (Signed)
Good to see you Please continue the splint  Please continue the soft grips  Good luck with your procedure on Friday  Please be seen in the emergency department if your chest pain gets worse  Please send me a message in MyChart with any questions or updates.  Please see me back in 4 weeks.   --Dr. Raeford Razor

## 2019-10-15 NOTE — Progress Notes (Signed)
Jenna Campbell - 69 y.o. female MRN [id number]  Date of birth: [birth year]  SUBJECTIVE:  Including CC & ROS.  Chief Complaint  Patient presents with  . Follow-up    bilateral hand    Jenna Campbell is a 69 y.o. female that is following up for her Dupuytren's.  Her swelling in her hands has improved.  She has been using the splints as well as making her grip finger.  She is also softening the handles.  She is having some chest pain today.  She is scheduled for heart catheterization on Friday.  She takes nitroglycerin and that improves her symptoms.  Denies any diaphoresis or nausea.  Review of Systems See HPI   HISTORY: Past Medical, Surgical, Social, and Family History Reviewed & Updated per EMR.   Pertinent Historical Findings include:  Past Medical History:  Diagnosis Date  . Acute bronchitis due to other specified organisms 04/21/2015  . Acute tension-type headache 12/16/2015  . Adjustment disorder with anxious mood 04/21/2015  . Allergic asthma with acute exacerbation 09/22/2014  . Allergic rhinitis due to pollen 07/23/2014  . Allergic urticaria 05/13/2015  . Allergy   . Alopecia 10/30/2013  . Angina pectoris ([place]) 07/29/2019  . Arthritis 04/24/2016  . Asthma   . Atrophic vaginitis 10/30/2013  . Basal cell carcinoma 04/21/2015  . Bilateral impacted cerumen 04/21/2015  . Cancer ([place])    skin cancer  . Cervical radiculopathy 09/17/2015  . Change in bowel function 06/14/2016  . Chest pain in adult 05/28/2017  . Chronic diarrhea 12/16/2015  . Colon polyps   . Cough 02/14/2016  . Cystocele, midline 10/30/2013  . Dermatitis 09/22/2014  . 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 Diverticulosis of large intestine 10/30/2013  . Dupuytren's contracture of both hands 09/17/2019  . Dyspareunia 07/23/2014  . Dysuria 12/16/2015  . Encounter for long-term (current) use of other medications 10/19/2012  . Esophageal dysphagia 06/14/2016  . Essential hypertension   . Family history of pheochromocytoma 02/14/2016  .  Gastroesophageal reflux disease without esophagitis 05/13/2015  . Generalized abdominal pain 12/16/2015  . GERD (gastroesophageal reflux disease)   . H/O: hysterectomy   . Herpes simplex 08/17/2016  . History of adenomatous polyp of colon 06/14/2016  . History of blood transfusion 1970  . Hx of migraines 05/15/2009  . Hypothyroidism 10/30/2013  . Hypotonic bladder 10/30/2013  . Localized edema 12/16/2015  . Low back pain 09/17/2015  . Lumbar paraspinal muscle spasm 04/21/2015  . Major depression, recurrent ([place]) 07/14/2013  . Malaise and fatigue 04/21/2015  . MCI (mild cognitive impairment) 08/18/2015  . Memory difficulty 09/17/2015  . Menopause 03/31/2014  . Midline low back pain without sciatica 03/31/2014  . Migraine   . Mild intermittent extrinsic asthma 12/16/2015  . Mild persistent asthma 05/13/2015  . Neck pain 09/17/2015  . Neuropathy 09/22/2014  . Nonintractable headache 11/02/2016  . Other allergic rhinitis 05/13/2015  . Pelvic pain in female 02/17/2015  . Personality disorder ([place]) 10/19/2012  . Pharyngoesophageal dysphagia 04/21/2015  . Plantar fasciitis 01/20/2016  . Precordial chest pain 08/18/2015  . Prolapse of vaginal vault after hysterectomy 10/30/2013  . Rectocele 10/30/2013  . Shin splint 04/21/2015  . Shortness of breath 07/17/2016  . Sinusitis, chronic 10/30/2013  . Slow transit constipation 07/23/2014  . Swelling of lower limb 05/17/2016  . Tension-type headache, not intractable 09/17/2015  . Thyroid disease   . Urethral stricture 10/30/2013  . Urinary incontinence   . Urinary incontinence without sensory awareness 07/17/2016  . Varicose veins of  both lower extremities 04/24/2016  . Venous insufficiency (chronic) (peripheral) 06/06/2016    Past Surgical History:  Procedure Laterality Date  . BREAST CYST ASPIRATION Left    25 years ago   . MOHS SURGERY  10/05/2015  . RECTOCELE REPAIR  2011  . SKIN CANCER EXCISION  02/2015   Squamous cell removed from back  . TONSILLECTOMY    . VAGINAL  HYSTERECTOMY    . VAGINAL PROLAPSE REPAIR      Family History  Problem Relation Age of Onset  . Asthma Father   . Angina Father   . Emphysema Father   . Alzheimer's disease Mother   . Cancer Sister        died of cancer    Social History   Socioeconomic History  . Marital status: Divorced    Spouse name: Not on file  . Number of children: Not on file  . Years of education: Not on file  . Highest education level: Not on file  Occupational History  . Not on file  Tobacco Use  . Smoking status: Never Smoker  . Smokeless tobacco: Never Used  Substance and Sexual Activity  . Alcohol use: Yes  . Drug use: No  . Sexual activity: Never    Partners: Male  Other Topics Concern  . Not on file  Social History Narrative  . Not on file   Social Determinants of Health   Financial Resource Strain:   . Difficulty of Paying Living Expenses:   Food Insecurity:   . Worried About Charity fundraiser in the Last Year:   . Arboriculturist in the Last Year:   Transportation Needs:   . Film/video editor (Medical):   Marland Kitchen Lack of Transportation (Non-Medical):   Physical Activity:   . Days of Exercise per Week:   . Minutes of Exercise per Session:   Stress:   . Feeling of Stress :   Social Connections:   . Frequency of Communication with Friends and Family:   . Frequency of Social Gatherings with Friends and Family:   . Attends Religious Services:   . Active Member of Clubs or Organizations:   . Attends Archivist Meetings:   Marland Kitchen Marital Status:   Intimate Partner Violence:   . Fear of Current or Ex-Partner:   . Emotionally Abused:   Marland Kitchen Physically Abused:   . Sexually Abused:      PHYSICAL EXAM:  VS: BP 120/82   Pulse 65   Ht 5\' 7"  (1.702 m)   Wt 147 lb (66.7 kg)   BMI 23.02 kg/m  Physical Exam Gen: NAD, alert, cooperative with exam, well-appearing MSK:  Right and left hand: Improvement of swelling of the palm. Nodule still appreciated on the third and  fourth palmar aspect of the right. Nodule appreciated on the fourth palmar aspect on the left. Normal flexion and extension of the fingers. Neurovascular intact     ASSESSMENT & PLAN:   Dupuytren's contracture of both hands She has had improvement of pain and swelling with the splinting and grip changes.  She has a heart cath on Friday so hold off on any injections at this point. -Counseled on supportive care. -Continue splinting and softening and enlarging the groups. -Can consider injections going forward.  Chest pain in adult Has had recurrent chest pain.  She takes nitroglycerin and her symptoms improved.  She does have a heart catheterization set for Friday.  She looks well on exam.  No  diaphoresis.  Reports no nausea.  Symptoms improved with nitro today in clinic. -Counseled to be seen emergently if her symptoms worsen.

## 2019-10-15 NOTE — Patient Instructions (Signed)
Give [place] 2-3 business days to get the results of your labs back.   Keep the diet clean and stay active.  Continue your allergy medication.   We are changing your reflux medication as this could be causing your sore throat.  Let us know if you need anything.

## 2019-10-15 NOTE — Addendum Note (Signed)
Addended by: Truddie Hidden on: 10/15/2019 10:28 AM   Modules accepted: Orders

## 2019-10-15 NOTE — Assessment & Plan Note (Signed)
She has had improvement of pain and swelling with the splinting and grip changes.  She has a heart cath on Friday so hold off on any injections at this point. -Counseled on supportive care. -Continue splinting and softening and enlarging the groups. -Can consider injections going forward.

## 2019-10-15 NOTE — Assessment & Plan Note (Signed)
Has had recurrent chest pain.  She takes nitroglycerin and her symptoms improved.  She does have a heart catheterization set for Friday.  She looks well on exam.  No diaphoresis.  Reports no nausea.  Symptoms improved with nitro today in clinic. -Counseled to be seen emergently if her symptoms worsen.

## 2019-10-16 ENCOUNTER — Encounter: Payer: Self-pay | Admitting: Family Medicine

## 2019-10-16 LAB — URINE CYTOLOGY ANCILLARY ONLY
Bacterial Vaginitis-Urine: NEGATIVE
Candida Urine: NEGATIVE
Comment: NEGATIVE
Trichomonas: NEGATIVE

## 2019-10-16 LAB — TSH: TSH: 1.76 u[IU]/mL (ref 0.35–4.50)

## 2019-10-16 LAB — T4, FREE: Free T4: 1.02 ng/dL (ref 0.60–1.60)

## 2019-10-17 ENCOUNTER — Encounter (HOSPITAL_COMMUNITY)
Admission: RE | Disposition: A | Discharge status: Home / Self Care | Payer: PPO | Source: Home / Self Care | Attending: Internal Medicine

## 2019-10-17 ENCOUNTER — Ambulatory Visit (HOSPITAL_COMMUNITY)
Admission: RE | Admit: 2019-10-17 | Discharge: 2019-10-17 | Disposition: A | Discharge status: Home / Self Care | Payer: PPO | Attending: Internal Medicine | Admitting: Internal Medicine

## 2019-10-17 DIAGNOSIS — E039 Hypothyroidism, unspecified: Secondary | ICD-10-CM | POA: Diagnosis not present

## 2019-10-17 DIAGNOSIS — Z79899 Other long term (current) drug therapy: Secondary | ICD-10-CM | POA: Insufficient documentation

## 2019-10-17 DIAGNOSIS — Z882 Allergy status to sulfonamides status: Secondary | ICD-10-CM | POA: Insufficient documentation

## 2019-10-17 DIAGNOSIS — Z880 Allergy status to penicillin: Secondary | ICD-10-CM | POA: Diagnosis not present

## 2019-10-17 DIAGNOSIS — K219 Gastro-esophageal reflux disease without esophagitis: Secondary | ICD-10-CM | POA: Diagnosis not present

## 2019-10-17 DIAGNOSIS — I209 Angina pectoris, unspecified: Secondary | ICD-10-CM

## 2019-10-17 DIAGNOSIS — Z79890 Hormone replacement therapy (postmenopausal): Secondary | ICD-10-CM | POA: Insufficient documentation

## 2019-10-17 DIAGNOSIS — I872 Venous insufficiency (chronic) (peripheral): Secondary | ICD-10-CM | POA: Diagnosis not present

## 2019-10-17 DIAGNOSIS — I10 Essential (primary) hypertension: Secondary | ICD-10-CM | POA: Insufficient documentation

## 2019-10-17 DIAGNOSIS — Z7951 Long term (current) use of inhaled steroids: Secondary | ICD-10-CM | POA: Diagnosis not present

## 2019-10-17 DIAGNOSIS — I200 Unstable angina: Secondary | ICD-10-CM | POA: Diagnosis present

## 2019-10-17 DIAGNOSIS — T7840XD Allergy, unspecified, subsequent encounter: Secondary | ICD-10-CM

## 2019-10-17 DIAGNOSIS — F329 Major depressive disorder, single episode, unspecified: Secondary | ICD-10-CM | POA: Insufficient documentation

## 2019-10-17 DIAGNOSIS — I25110 Atherosclerotic heart disease of native coronary artery with unstable angina pectoris: Secondary | ICD-10-CM | POA: Diagnosis not present

## 2019-10-17 DIAGNOSIS — Z881 Allergy status to other antibiotic agents status: Secondary | ICD-10-CM | POA: Diagnosis not present

## 2019-10-17 DIAGNOSIS — J4530 Mild persistent asthma, uncomplicated: Secondary | ICD-10-CM | POA: Diagnosis not present

## 2019-10-17 HISTORY — PX: CORONARY PRESSURE/FFR STUDY: CATH118243

## 2019-10-17 HISTORY — PX: LEFT HEART CATH AND CORONARY ANGIOGRAPHY: CATH118249

## 2019-10-17 LAB — POCT ACTIVATED CLOTTING TIME: Activated Clotting Time: 241 seconds

## 2019-10-17 SURGERY — LEFT HEART CATH AND CORONARY ANGIOGRAPHY
Anesthesia: LOCAL

## 2019-10-17 MED ORDER — VERAPAMIL HCL 2.5 MG/ML IV SOLN
INTRAVENOUS | Status: DC | PRN
  Administered 2019-10-17: 10 mL via INTRA_ARTERIAL

## 2019-10-17 MED ORDER — LIDOCAINE HCL (PF) 1 % IJ SOLN
INTRAMUSCULAR | Status: AC
  Filled 2019-10-17: qty 30

## 2019-10-17 MED ORDER — HEPARIN (PORCINE) IN NACL 1000-0.9 UT/500ML-% IV SOLN
INTRAVENOUS | Status: AC
  Filled 2019-10-17: qty 1000

## 2019-10-17 MED ORDER — MIDAZOLAM HCL 2 MG/2ML IJ SOLN
INTRAMUSCULAR | Status: DC | PRN
  Administered 2019-10-17: 1 mg via INTRAVENOUS

## 2019-10-17 MED ORDER — ASPIRIN 81 MG PO CHEW: 81.0000 mg | CHEWABLE_TABLET | Freq: Every day | ORAL | Status: DC

## 2019-10-17 MED ORDER — ADENOSINE 12 MG/4ML IV SOLN
INTRAVENOUS | Status: AC
  Filled 2019-10-17: qty 16

## 2019-10-17 MED ORDER — SODIUM CHLORIDE 0.9% FLUSH: 3.0000 mL | Freq: Two times a day (BID) | INTRAVENOUS | Status: DC

## 2019-10-17 MED ORDER — SODIUM CHLORIDE 0.9% FLUSH: 3.0000 mL | INTRAVENOUS | Status: DC | PRN

## 2019-10-17 MED ORDER — ISOSORBIDE MONONITRATE ER 30 MG PO TB24
15.0000 mg | ORAL_TABLET | Freq: Every day | ORAL | 5 refills | Status: DC
Start: 2019-10-17 — End: 2021-05-23

## 2019-10-17 MED ORDER — VERAPAMIL HCL 2.5 MG/ML IV SOLN
INTRAVENOUS | Status: AC
  Filled 2019-10-17: qty 2

## 2019-10-17 MED ORDER — NITROGLYCERIN 1 MG/10 ML FOR IR/CATH LAB
INTRA_ARTERIAL | Status: AC
  Filled 2019-10-17: qty 10

## 2019-10-17 MED ORDER — HYDRALAZINE HCL 20 MG/ML IJ SOLN: 10.0000 mg | INTRAMUSCULAR | Status: DC | PRN

## 2019-10-17 MED ORDER — ADENOSINE (DIAGNOSTIC) 140MCG/KG/MIN
INTRAVENOUS | Status: DC | PRN
  Administered 2019-10-17: 140 ug/kg/min via INTRAVENOUS

## 2019-10-17 MED ORDER — SODIUM CHLORIDE 0.9 % IV SOLN: 250.0000 mL | INTRAVENOUS | Status: DC | PRN

## 2019-10-17 MED ORDER — LIDOCAINE HCL (PF) 1 % IJ SOLN
INTRAMUSCULAR | Status: DC | PRN
  Administered 2019-10-17: 2 mL

## 2019-10-17 MED ORDER — SODIUM CHLORIDE 0.9 % WEIGHT BASED INFUSION
3.0000 mL/kg/h | INTRAVENOUS | Status: AC
  Administered 2019-10-17: 3 mL/kg/h via INTRAVENOUS

## 2019-10-17 MED ORDER — ONDANSETRON HCL 4 MG/2ML IJ SOLN: 4.0000 mg | Freq: Four times a day (QID) | INTRAMUSCULAR | Status: DC | PRN

## 2019-10-17 MED ORDER — MIDAZOLAM HCL 2 MG/2ML IJ SOLN
INTRAMUSCULAR | Status: AC
  Filled 2019-10-17: qty 2

## 2019-10-17 MED ORDER — LABETALOL HCL 5 MG/ML IV SOLN: 10.0000 mg | INTRAVENOUS | Status: DC | PRN

## 2019-10-17 MED ORDER — HEPARIN (PORCINE) IN NACL 1000-0.9 UT/500ML-% IV SOLN
INTRAVENOUS | Status: DC | PRN
  Administered 2019-10-17 (×2): 500 mL

## 2019-10-17 MED ORDER — ACETAMINOPHEN 325 MG PO TABS: 650.0000 mg | ORAL_TABLET | ORAL | Status: DC | PRN

## 2019-10-17 MED ORDER — FENTANYL CITRATE (PF) 100 MCG/2ML IJ SOLN
INTRAMUSCULAR | Status: DC | PRN
  Administered 2019-10-17: 25 ug via INTRAVENOUS

## 2019-10-17 MED ORDER — HEPARIN SODIUM (PORCINE) 1000 UNIT/ML IJ SOLN
INTRAMUSCULAR | Status: DC | PRN
  Administered 2019-10-17 (×2): 3500 [IU] via INTRAVENOUS

## 2019-10-17 MED ORDER — IOHEXOL 350 MG/ML SOLN
INTRAVENOUS | Status: DC | PRN
  Administered 2019-10-17: 75 mL

## 2019-10-17 MED ORDER — SODIUM CHLORIDE 0.9 % WEIGHT BASED INFUSION: 1.0000 mL/kg/h | INTRAVENOUS | Status: DC

## 2019-10-17 MED ORDER — SODIUM CHLORIDE 0.9 % IV SOLN: INTRAVENOUS | Status: AC

## 2019-10-17 MED ORDER — HEPARIN SODIUM (PORCINE) 1000 UNIT/ML IJ SOLN
INTRAMUSCULAR | Status: AC
  Filled 2019-10-17: qty 1

## 2019-10-17 MED ORDER — ASPIRIN 81 MG PO CHEW: 81.0000 mg | CHEWABLE_TABLET | ORAL | Status: DC

## 2019-10-17 MED ORDER — NITROGLYCERIN 1 MG/10 ML FOR IR/CATH LAB
INTRA_ARTERIAL | Status: DC | PRN
  Administered 2019-10-17: 200 ug via INTRACORONARY

## 2019-10-17 MED ORDER — FENTANYL CITRATE (PF) 100 MCG/2ML IJ SOLN
INTRAMUSCULAR | Status: AC
  Filled 2019-10-17: qty 2

## 2019-10-17 SURGICAL SUPPLY — 14 items
CATH 5FR JL3.5 JR4 ANG PIG MP (CATHETERS) ×2 IMPLANT
CATH LAUNCHER 5F EBU3.5 (CATHETERS) ×2 IMPLANT
DEVICE RAD COMP TR BAND LRG (VASCULAR PRODUCTS) ×2 IMPLANT
GLIDESHEATH SLEND SS 6F .021 (SHEATH) ×2 IMPLANT
GUIDEWIRE INQWIRE 1.5J.035X260 (WIRE) ×1 IMPLANT
GUIDEWIRE PRESSURE COMET II (WIRE) ×2 IMPLANT
INQWIRE 1.5J .035X260CM (WIRE) ×2
KIT HEART LEFT (KITS) ×2 IMPLANT
KIT HEMO VALVE WATCHDOG (MISCELLANEOUS) ×2 IMPLANT
PACK CARDIAC CATHETERIZATION (CUSTOM PROCEDURE TRAY) ×2 IMPLANT
SHEATH PROBE COVER 6X72 (BAG) ×2 IMPLANT
TRANSDUCER W/STOPCOCK (MISCELLANEOUS) ×2 IMPLANT
TUBING CIL FLEX 10 FLL-RA (TUBING) ×2 IMPLANT
WIRE HI TORQ VERSACORE-J 145CM (WIRE) ×2 IMPLANT

## 2019-10-17 NOTE — Progress Notes (Signed)
Benadryl 50mg  at 0745 this morning

## 2019-10-17 NOTE — Discharge Instructions (Signed)
Radial Site Care  This sheet gives you information about how to care for yourself after your procedure. Your health care provider may also give you more specific instructions. If you have problems or questions, contact your health care provider. What can I expect after the procedure? After the procedure, it is common to have:  Bruising and tenderness at the catheter insertion area. Follow these instructions at home: Medicines  Take over-the-counter and prescription medicines only as told by your health care provider. Insertion site care  Follow instructions from your health care provider about how to take care of your insertion site. Make sure you: ? Wash your hands with soap and water before you change your bandage (dressing). If soap and water are not available, use hand sanitizer. ? Change your dressing as told by your health care provider. ? Leave stitches (sutures), skin glue, or adhesive strips in place. These skin closures may need to stay in place for 2 weeks or longer. If adhesive strip edges start to loosen and curl up, you may trim the loose edges. Do not remove adhesive strips completely unless your health care provider tells you to do that.  Check your insertion site every day for signs of infection. Check for: ? Redness, swelling, or pain. ? Fluid or blood. ? Pus or a bad smell. ? Warmth.  Do not take baths, swim, or use a hot tub until your health care provider approves.  You may shower 24-48 hours after the procedure, or as directed by your health care provider. ? Remove the dressing and gently wash the site with plain soap and water. ? Pat the area dry with a clean towel. ? Do not rub the site. That could cause bleeding.  Do not apply powder or lotion to the site. Activity   For 24 hours after the procedure, or as directed by your health care provider: ? Do not flex or bend the affected arm. ? Do not push or pull heavy objects with the affected arm. ? Do not  drive yourself home from the hospital or clinic. You may drive 24 hours after the procedure unless your health care provider tells you not to. ? Do not operate machinery or power tools.  Do not lift anything that is heavier than 10 lb (4.5 kg), or the limit that you are told, until your health care provider says that it is safe.  Ask your health care provider when it is okay to: ? Return to work or school. ? Resume usual physical activities or sports. ? Resume sexual activity. General instructions  If the catheter site starts to bleed, raise your arm and put firm pressure on the site. If the bleeding does not stop, get help right away. This is a medical emergency.  If you went home on the same day as your procedure, a responsible adult should be with you for the first 24 hours after you arrive home.  Keep all follow-up visits as told by your health care provider. This is important. Contact a health care provider if:  You have a fever.  You have redness, swelling, or yellow drainage around your insertion site. Get help right away if:  You have unusual pain at the radial site.  The catheter insertion area swells very fast.  The insertion area is bleeding, and the bleeding does not stop when you hold steady pressure on the area.  Your arm or hand becomes pale, cool, tingly, or numb. These symptoms may represent a serious problem   that is an emergency. Do not wait to see if the symptoms will go away. Get medical help right away. Call your local emergency services (911 in the [place] Do not drive yourself to the hospital. Summary  After the procedure, it is common to have bruising and tenderness at the site.  Follow instructions from your health care provider about how to take care of your radial site wound. Check the wound every day for signs of infection.  Do not lift anything that is heavier than 10 lb (4.5 kg), or the limit that you are told, until your health care provider says  that it is safe. This information is not intended to replace advice given to you by your health care provider. Make sure you discuss any questions you have with your health care provider. Document Revised: 06/06/2017 Document Reviewed: 06/06/2017 Elsevier Patient Education  2020 Elsevier Inc.  

## 2019-10-17 NOTE — Interval H&P Note (Signed)
History and Physical Interval Note:  10/17/2019 12:23 PM  [name]  has presented today for surgery, with the diagnosis of unstable angina.  The various methods of treatment have been discussed with the patient and family. After consideration of risks, benefits and other options for treatment, the patient has consented to  Procedure(s): LEFT HEART CATH AND CORONARY ANGIOGRAPHY (N/A) as a surgical intervention.  The patient's history has been reviewed, patient examined, no change in status, stable for surgery.  I have reviewed the patient's chart and labs.  Questions were answered to the patient's satisfaction.    Cath Lab Visit (complete for each Cath Lab visit)  Clinical Evaluation Leading to the Procedure:   ACS: No.  Non-ACS:    Anginal Classification: CCS IV  Anti-ischemic medical therapy: No Therapy  Non-Invasive Test Results: High-risk stress test findings: cardiac mortality >3%/year  Prior CABG: No previous CABG  Jenna Campbell

## 2019-10-17 NOTE — Brief Op Note (Signed)
BRIEF CARDIAC CATHETERIZATION NOTE  10/17/2019  1:41 PM  PATIENT:  Jenna Campbell  69 y.o. female  PRE-OPERATIVE DIAGNOSIS:  Unstable angina  POST-OPERATIVE DIAGNOSIS:  Non-obstructive coronary artery disease  PROCEDURE:  Procedure(s): LEFT HEART CATH AND CORONARY ANGIOGRAPHY (N/A) INTRAVASCULAR PRESSURE WIRE/FFR STUDY (N/A)  SURGEON:  Surgeon(s) and Role:    Nelva Bush, MD - Primary  FINDINGS: 1. Single vessel coronary artery disease with 50-60% ostial/proximal LAD stenosis that is not hemodynamically significant (DFR = 0.94, FFR = 0.88). 2. No significant CAD involving the LCx or RCA. 3. Normal LVEF and LVEDP.  RECOMMENDATIONS: 1. Medical therapy, including addition of isosorbide mononitrate and escalation of statin therapy as tolerated.  Nelva Bush, MD [facility]

## 2019-10-19 ENCOUNTER — Other Ambulatory Visit: Payer: Self-pay | Admitting: Family Medicine

## 2019-10-19 DIAGNOSIS — J069 Acute upper respiratory infection, unspecified: Secondary | ICD-10-CM

## 2019-10-20 ENCOUNTER — Telehealth: Payer: Self-pay | Admitting: Cardiology

## 2019-10-20 NOTE — Telephone Encounter (Signed)
Patient states she is calling to follow up in regards to whether or not she needs to be seen on 11/12/19. She states she assumes she may need to be seen sooner, however she will wait until she hears back from a nurse to reschedule.

## 2019-10-20 NOTE — Telephone Encounter (Signed)
Message has been sent to RRR regarding being sooner than 6/30.

## 2019-10-21 NOTE — Telephone Encounter (Signed)
Pt states that she has noticed that she feels more energy. Pt was advised to speak with the pharmacist regarding the muscle pain medication.   Do you feel that the pt needs to be seen sooner than 11/12/19?

## 2019-10-21 NOTE — Telephone Encounter (Signed)
Patient is calling to follow up in regards to being seen sooner than 11/12/19. Patient states she would also like to confirm that she is eligible to take medication prescribed for muscle pain. Name of medication not available. Please advise.

## 2019-10-22 NOTE — Telephone Encounter (Signed)
Please clarify what medicine it is for the muscle pain that you are referring to !

## 2019-10-22 NOTE — Telephone Encounter (Signed)
lvmtcb to verify the medicine for muscle pain.

## 2019-10-23 NOTE — Telephone Encounter (Signed)
It is best that she calls and speaks to her pharmacist where she picked up the medicine to see if there are any interactions.

## 2019-10-23 NOTE — Telephone Encounter (Signed)
The medication is Tizanidine.  Do you think that this pt needs to be sooner than 11/12/19?

## 2019-10-23 NOTE — Telephone Encounter (Signed)
Pt is aware that the 11/12/19 appt is ok and does not need to change to an earlier appt. Pt will talk with pharmacist regarding her muscle pain medication. Pt verbalized understanding and had no questions.

## 2019-10-28 ENCOUNTER — Telehealth: Payer: Self-pay | Admitting: Cardiology

## 2019-10-28 ENCOUNTER — Encounter: Payer: Self-pay | Admitting: Cardiology

## 2019-10-28 ENCOUNTER — Ambulatory Visit: Payer: PPO | Admitting: Cardiology

## 2019-10-28 ENCOUNTER — Other Ambulatory Visit: Payer: Self-pay

## 2019-10-28 VITALS — BP 140/80 | HR 64 | Ht 67.0 in | Wt 149.0 lb

## 2019-10-28 DIAGNOSIS — I2510 Atherosclerotic heart disease of native coronary artery without angina pectoris: Secondary | ICD-10-CM | POA: Insufficient documentation

## 2019-10-28 DIAGNOSIS — I2089 Other forms of angina pectoris: Secondary | ICD-10-CM | POA: Insufficient documentation

## 2019-10-28 DIAGNOSIS — I208 Other forms of angina pectoris: Secondary | ICD-10-CM

## 2019-10-28 DIAGNOSIS — I10 Essential (primary) hypertension: Secondary | ICD-10-CM

## 2019-10-28 DIAGNOSIS — M25532 Pain in left wrist: Secondary | ICD-10-CM

## 2019-10-28 HISTORY — DX: Other forms of angina pectoris: I20.89

## 2019-10-28 HISTORY — DX: Atherosclerotic heart disease of native coronary artery without angina pectoris: I25.10

## 2019-10-28 HISTORY — DX: Other forms of angina pectoris: I20.8

## 2019-10-28 MED ORDER — CLINDAMYCIN HCL 300 MG PO CAPS: 300.0000 mg | ORAL_CAPSULE | Freq: Three times a day (TID) | ORAL | 1 refills | Status: DC

## 2019-10-28 NOTE — Telephone Encounter (Signed)
How do you advise?

## 2019-10-28 NOTE — Telephone Encounter (Signed)
New Message:     Pt says she is having so much pain and soreness in right wrist and arm where she had her Cath, It is not getting better.

## 2019-10-28 NOTE — Telephone Encounter (Signed)
Pl bring her in for me to see

## 2019-10-28 NOTE — Telephone Encounter (Signed)
Pt will come to the [place] office to be worked in.

## 2019-10-28 NOTE — Patient Instructions (Signed)
Medication Instructions:  Start clindamycin 300 mg three times a day. *If you need a refill on your cardiac medications before your next appointment, please call your pharmacy*   Lab Work: Your physician recommends that you have a BMET and CBC today.  If you have labs (blood work) drawn today and your tests are completely normal, you will receive your results only by: Marland Kitchen MyChart Message (if you have MyChart) OR . A paper copy in the mail If you have any lab test that is abnormal or we need to change your treatment, we will call you to review the results.   Testing/Procedures: Your physician has requested that you have a lower or upper extremity arterial duplex. This test is an ultrasound of the arteries in the legs or arms. It looks at arterial blood flow in the legs and arms. Allow one hour for Lower and Upper Arterial scans. There are no restrictions or special instructions   Follow-Up: At [facility], you and your health needs are our priority.  As part of our continuing mission to provide you with exceptional heart care, we have created designated Provider Care Teams.  These Care Teams include your primary Cardiologist (physician) and Advanced Practice Providers (APPs -  Physician Assistants and Nurse Practitioners) who all work together to provide you with the care you need, when you need it.  We recommend signing up for the patient portal called "MyChart".  Sign up information is provided on this After Visit Summary.  MyChart is used to connect with patients for Virtual Visits (Telemedicine).  Patients are able to view lab/test results, encounter notes, upcoming appointments, etc.  Non-urgent messages can be sent to your provider as well.   To learn more about what you can do with MyChart, go to [email or web address].    Your next appointment:   Keep appointment that is already scheduled.   Other Instructions Clindamycin capsules What is this medicine? CLINDAMYCIN ([place] sin) is a lincosamide antibiotic. It is used to treat certain kinds of bacterial infections. It will not work for colds, flu, or other viral infections. This medicine may be used for other purposes; ask your health care provider or pharmacist if you have questions. COMMON BRAND NAME(S): Cleocin What should I tell my health care provider before I take this medicine? They need to know if you have any of these conditions:  kidney disease  liver disease  stomach problems like colitis  an unusual or allergic reaction to clindamycin, lincomycin, or other medicines, foods, dyes like tartrazine or preservatives  pregnant or trying to get pregnant  breast-feeding How should I use this medicine? Take this medicine by mouth with a full glass of water. Follow the directions on the prescription label. You can take this medicine with food or on an empty stomach. If the medicine upsets your stomach, take it with food. Take your medicine at regular intervals. Do not take your medicine more often than directed. Take all of your medicine as directed even if you think your are better. Do not skip doses or stop your medicine early. Talk to your pediatrician regarding the use of this medicine in children. Special care may be needed. Overdosage: If you think you have taken too much of this medicine contact a poison control center or emergency room at once. NOTE: This medicine is only for you. Do not share this medicine with others. What if I miss a dose? If you miss a dose, take it as soon as you  can. If it is almost time for your next dose, take only that dose. Do not take double or extra doses. What may interact with this medicine?  birth control pills  erythromycin  medicines that relax muscles for surgery  rifampin This list may not describe all possible interactions. Give your health care provider a list of all the medicines, herbs, non-prescription drugs, or dietary supplements you use. Also  tell them if you smoke, drink alcohol, or use illegal drugs. Some items may interact with your medicine. What should I watch for while using this medicine? Tell your doctor or health care provider if your symptoms do not start to get better or if they get worse. This medicine may cause serious skin reactions. They can happen weeks to months after starting the medicine. Contact your health care provider right away if you notice fevers or flu-like symptoms with a rash. The rash may be red or purple and then turn into blisters or peeling of the skin. Or, you might notice a red rash with swelling of the face, lips or lymph nodes in your neck or under your arms. Do not treat diarrhea with over the counter products. Contact your doctor if you have diarrhea that lasts more than 2 days or if it is severe and watery. What side effects may I notice from receiving this medicine? Side effects that you should report to your doctor or health care professional as soon as possible:  allergic reactions like skin rash, itching or hives, swelling of the face, lips, or tongue  dark urine  pain on swallowing  rash, fever, and swollen lymph nodes  redness, blistering, peeling or loosening of the skin, including inside the mouth  unusual bleeding or bruising  unusually weak or tired  yellowing of eyes or skin Side effects that usually do not require medical attention (report to your doctor or health care professional if they continue or are bothersome):  diarrhea  itching in the rectal or genital area  joint pain  nausea, vomiting  stomach pain This list may not describe all possible side effects. Call your doctor for medical advice about side effects. You may report side effects to FDA at 1-800-FDA-1088. Where should I keep my medicine? Keep out of the reach of children. Store at room temperature between 20 and 25 degrees C (68 and 77 degrees F). Throw away any unused medicine after the expiration  date. NOTE: This sheet is a summary. It may not cover all possible information. If you have questions about this medicine, talk to your doctor, pharmacist, or health care provider.  2020 Elsevier/Gold Standard (2018-08-01 12:02:12)

## 2019-10-28 NOTE — Progress Notes (Signed)
Cardiology Office Note:    Date:  10/28/2019   ID:  Jenna Campbell, DOB 1951-02-14, MRN [id number]  PCP:  Shelda Pal, DO  Cardiologist:  Jenean Lindau, MD   Referring MD: Shelda Pal*    ASSESSMENT:    1. Essential hypertension   2. Coronary artery disease involving native coronary artery, angina presence unspecified, unspecified whether native or transplanted heart   3. Stable angina (HCC)    PLAN:    In order of problems listed above:  1. Coronary artery disease: Coronary angiography report was discussed with the patient at length.  Secondary prevention stressed.  Importance of compliance with diet medication stressed and she vocalized understanding. 2. Essential hypertension: Blood pressure is stable and diet was emphasized 3. Mixed dyslipidemia: I reviewed lipids and she is taking statin therapy and dieting appropriately. 4. Her right wrist at access site has abnormal pulsation so we will do an ultrasound to assess for any aneurysm or any blood clot.  She also has some mild tenderness in the surrounding area so we will do a CBC with a differential.  I will also empirically initiated on Augmentin 875 mg twice daily.  I discussed this with her at length and benefits and risks explained and she vocalized understanding she has an appointment with me in the next several weeks at which time I will follow her up with this issue.  If this does not get better she will give [place] a call.  Patient had multiple questions which were answered to her satisfaction.   Medication Adjustments/Labs and Tests Ordered: Current medicines are reviewed at length with the patient today.  Concerns regarding medicines are outlined above.  No orders of the defined types were placed in this encounter.  No orders of the defined types were placed in this encounter.    No chief complaint on file.    History of Present Illness:    Jenna Campbell is a 69 y.o. female.  Patient was  evaluated by me.  She was sent for coronary angiography and this revealed proximal LAD stenosis.  Medical therapy was recommended.  Subsequently she is supine.  She was seen in my office today as she complained of some wrist pain for the past couple of days and I wanted her to come in and get checked.  She mentions to me that there is pain in the area of the wrist where she had access for coronary angiography.  She also complains of some discomfort a little higher.  No chest pain orthopnea or PND.  At the time of my evaluation, the patient is alert awake oriented and in no distress.  Past Medical History:  Diagnosis Date  . Acute bronchitis due to other specified organisms 04/21/2015  . Acute tension-type headache 12/16/2015  . Adjustment disorder with anxious mood 04/21/2015  . Allergic asthma with acute exacerbation 09/22/2014  . Allergic rhinitis due to pollen 07/23/2014  . Allergic urticaria 05/13/2015  . Allergy   . Alopecia 10/30/2013  . Angina pectoris ([place]) 07/29/2019  . Arthritis 04/24/2016  . Asthma   . Atrophic vaginitis 10/30/2013  . Basal cell carcinoma 04/21/2015  . Bilateral impacted cerumen 04/21/2015  . Cancer ([place])    skin cancer  . Cervical radiculopathy 09/17/2015  . Change in bowel function 06/14/2016  . Chest pain in adult 05/28/2017  . Chronic diarrhea 12/16/2015  . Colon polyps   . Cough 02/14/2016  . Cystocele, midline 10/30/2013  . Dermatitis 09/22/2014  . Dissociative  disorder 06/02/2013  . Diverticulosis of large intestine 10/30/2013  . Dupuytren's contracture of both hands 09/17/2019  . Dyspareunia 07/23/2014  . Dysuria 12/16/2015  . Encounter for long-term (current) use of other medications 10/19/2012  . Esophageal dysphagia 06/14/2016  . Essential hypertension   . Family history of pheochromocytoma 02/14/2016  . Gastroesophageal reflux disease without esophagitis 05/13/2015  . Generalized abdominal pain 12/16/2015  . GERD (gastroesophageal reflux disease)   . H/O: hysterectomy     . Herpes simplex 08/17/2016  . History of adenomatous polyp of colon 06/14/2016  . History of blood transfusion 1970  . Hx of migraines 05/15/2009  . Hypothyroidism 10/30/2013  . Hypotonic bladder 10/30/2013  . Localized edema 12/16/2015  . Low back pain 09/17/2015  . Lumbar paraspinal muscle spasm 04/21/2015  . Major depression, recurrent ([place]) 07/14/2013  . Malaise and fatigue 04/21/2015  . MCI (mild cognitive impairment) 08/18/2015  . Memory difficulty 09/17/2015  . Menopause 03/31/2014  . Midline low back pain without sciatica 03/31/2014  . Migraine   . Mild intermittent extrinsic asthma 12/16/2015  . Mild persistent asthma 05/13/2015  . Neck pain 09/17/2015  . Neuropathy 09/22/2014  . Nonintractable headache 11/02/2016  . Other allergic rhinitis 05/13/2015  . Pelvic pain in female 02/17/2015  . Personality disorder ([place]) 10/19/2012  . Pharyngoesophageal dysphagia 04/21/2015  . Plantar fasciitis 01/20/2016  . Precordial chest pain 08/18/2015  . Prolapse of vaginal vault after hysterectomy 10/30/2013  . Rectocele 10/30/2013  . Shin splint 04/21/2015  . Shortness of breath 07/17/2016  . Sinusitis, chronic 10/30/2013  . Slow transit constipation 07/23/2014  . Swelling of lower limb 05/17/2016  . Tension-type headache, not intractable 09/17/2015  . Thyroid disease   . Urethral stricture 10/30/2013  . Urinary incontinence   . Urinary incontinence without sensory awareness 07/17/2016  . Varicose veins of both lower extremities 04/24/2016  . Venous insufficiency (chronic) (peripheral) 06/06/2016    Past Surgical History:  Procedure Laterality Date  . BREAST CYST ASPIRATION Left    25 years ago   . INTRAVASCULAR PRESSURE WIRE/FFR STUDY N/A 10/17/2019   Procedure: INTRAVASCULAR PRESSURE WIRE/FFR STUDY;  Surgeon: Nelva Bush, MD;  Location: [place] [facility];  Service: Cardiovascular;  Laterality: N/A;  . LEFT HEART CATH AND CORONARY ANGIOGRAPHY N/A 10/17/2019   Procedure: LEFT HEART CATH AND CORONARY ANGIOGRAPHY;   Surgeon: Nelva Bush, MD;  Location: [place] [facility];  Service: Cardiovascular;  Laterality: N/A;  . MOHS SURGERY  10/05/2015  . RECTOCELE REPAIR  2011  . SKIN CANCER EXCISION  02/2015   Squamous cell removed from back  . TONSILLECTOMY    . VAGINAL HYSTERECTOMY    . VAGINAL PROLAPSE REPAIR      Current Medications: Current Meds  Medication Sig  . albuterol (PROVENTIL) (2.5 MG/3ML) 0.083% nebulizer solution Take 3 mLs (2.5 mg total) by nebulization every 6 (six) hours as needed for wheezing or shortness of breath.  . ASPIRIN 81 PO Take by mouth.  Marland Kitchen azelastine (ASTELIN) 0.1 % nasal spray Place 2 sprays into both nostrils 2 (two) times daily. Use in each nostril as directed  . betamethasone valerate (VALISONE) 0.1 % cream APPLY TO AFFECTED AREA TWICE A DAY (Patient taking differently: Apply 1 application topically 2 (two) times daily. )  . fluticasone (FLONASE) 50 MCG/ACT nasal spray Place 2 sprays into both nostrils daily.  . Fluticasone-Salmeterol (ADVAIR DISKUS) 250-50 MCG/DOSE AEPB Inhale 1 puff into the lungs in the morning and at bedtime.  . gabapentin (NEURONTIN)  300 MG capsule Take 300-600 mg by mouth See admin instructions. 300 mg in the morning, 600 mg at bedtime  . isosorbide mononitrate (IMDUR) 30 MG 24 hr tablet Take 0.5 tablets (15 mg total) by mouth daily.  Marland Kitchen levocetirizine (XYZAL) 5 MG tablet TAKE 1 TABLET BY MOUTH EVERY DAY IN THE EVENING  . levothyroxine (SYNTHROID) 150 MCG tablet TAKE 1 & 1/2 TABLETS BY MOUTH DAILY BEFORE BREAKFAST (Patient taking differently: Take 225 mcg by mouth daily before breakfast. )  . LORazepam (ATIVAN) 0.5 MG tablet Take 0.5 mg by mouth every 8 (eight) hours as needed for anxiety.   Marland Kitchen olopatadine (PATANOL) 0.1 % ophthalmic solution Place 1 drop into both eyes 2 (two) times daily.  . ondansetron (ZOFRAN-ODT) 4 MG disintegrating tablet Take 1 tablet (4 mg total) by mouth every 8 (eight) hours as needed for nausea or vomiting.  . pantoprazole  (PROTONIX) 40 MG tablet Take 1 tablet (40 mg total) by mouth daily.  . predniSONE (DELTASONE) 50 MG tablet Take 50 mg Prednisone 13 hours before your cath. Take 50 mg Prednisone 7 hours before your cath. Take 50 mg Benadryl and 50 mg Prednisone prior to leaving home for your cath.  . promethazine-dextromethorphan (PROMETHAZINE-DM) 6.25-15 MG/5ML syrup Take 5 mLs by mouth 4 (four) times daily as needed. (Patient taking differently: Take 5 mLs by mouth 4 (four) times daily as needed for cough. )  . rosuvastatin (CRESTOR) 10 MG tablet Take 1 tablet (10 mg total) by mouth daily.  . temazepam (RESTORIL) 15 MG capsule Take 15 mg by mouth at bedtime.   . topiramate (TOPAMAX) 100 MG tablet Take 100 mg by mouth at bedtime.   . valACYclovir (VALTREX) 500 MG tablet Take 2 tabs daily for 5 days when you have an outbreak. (Patient taking differently: Take 1,000 mg by mouth See admin instructions. Take 2 tabs daily for 5 days when you have an outbreak.)     Allergies:   Iodinated diagnostic agents, Kenalog [triamcinolone acetonide], Metrizamide, Other, Penicillins, Montelukast sodium, Ciprofloxacin, Diazepam, and Sulfa antibiotics   Social History   Socioeconomic History  . Marital status: Divorced    Spouse name: Not on file  . Number of children: Not on file  . Years of education: Not on file  . Highest education level: Not on file  Occupational History  . Not on file  Tobacco Use  . Smoking status: Never Smoker  . Smokeless tobacco: Never Used  Vaping Use  . Vaping Use: Never used  Substance and Sexual Activity  . Alcohol use: Yes  . Drug use: No  . Sexual activity: Never    Partners: Male  Other Topics Concern  . Not on file  Social History Narrative  . Not on file   Social Determinants of Health   Financial Resource Strain:   . Difficulty of Paying Living Expenses:   Food Insecurity:   . Worried About Charity fundraiser in the Last Year:   . Arboriculturist in the Last Year:     Transportation Needs:   . Film/video editor (Medical):   Marland Kitchen Lack of Transportation (Non-Medical):   Physical Activity:   . Days of Exercise per Week:   . Minutes of Exercise per Session:   Stress:   . Feeling of Stress :   Social Connections:   . Frequency of Communication with Friends and Family:   . Frequency of Social Gatherings with Friends and Family:   . Attends Religious  Services:   . Active Member of Clubs or Organizations:   . Attends Archivist Meetings:   Marland Kitchen Marital Status:      Family History: The patient's family history includes Alzheimer's disease in her mother; Angina in her father; Asthma in her father; Cancer in her sister; Emphysema in her father.  ROS:   Please see the history of present illness.    All other systems reviewed and are negative.  EKGs/Labs/Other Studies Reviewed:    The following studies were reviewed today: I discussed my findings with the patient at length.   Recent Labs: 07/27/2019: B Natriuretic Peptide 45.5 10/09/2019: ALT 20; BUN 11; Creatinine, Ser 0.71; Hemoglobin 13.5; Platelets 298; Potassium 4.0; Sodium 137 10/15/2019: TSH 1.76  Recent Lipid Panel    Component Value Date/Time   CHOL 211 (H) 10/09/2019 0928   TRIG 256 (H) 10/09/2019 0928   HDL 37 (L) 10/09/2019 0928   CHOLHDL 5.7 (H) 10/09/2019 0928   LDLCALC 128 (H) 10/09/2019 2952    Physical Exam:    VS:  BP 140/80   Pulse 64   Ht 5\' 7"  (1.702 m)   Wt 149 lb (67.6 kg)   SpO2 96%   BMI 23.34 kg/m     Wt Readings from Last 3 Encounters:  10/28/19 149 lb (67.6 kg)  10/17/19 147 lb (66.7 kg)  10/15/19 150 lb (68 kg)     GEN: Patient is in no acute distress HEENT: Normal NECK: No JVD; No carotid bruits LYMPHATICS: No lymphadenopathy CARDIAC: Hear sounds regular, 2/6 systolic murmur at the apex. RESPIRATORY:  Clear to auscultation without rales, wheezing or rhonchi  ABDOMEN: Soft, non-tender, non-distended MUSCULOSKELETAL:  No edema; No deformity   SKIN: Warm and dry NEUROLOGIC:  Alert and oriented x 3 PSYCHIATRIC:  Normal affect   Signed, Jenean Lindau, MD  10/28/2019 2:41 PM    [facility]

## 2019-10-29 ENCOUNTER — Telehealth: Payer: Self-pay

## 2019-10-29 ENCOUNTER — Telehealth: Payer: Self-pay | Admitting: Cardiology

## 2019-10-29 LAB — CBC WITH DIFFERENTIAL/PLATELET
Basophils Absolute: 0 10*3/uL (ref 0.0–0.2)
Basos: 1 %
EOS (ABSOLUTE): 0.1 10*3/uL (ref 0.0–0.4)
Eos: 2 %
Hematocrit: 38.9 % (ref 34.0–46.6)
Hemoglobin: 12.6 g/dL (ref 11.1–15.9)
Immature Grans (Abs): 0 10*3/uL (ref 0.0–0.1)
Immature Granulocytes: 0 %
Lymphocytes Absolute: 2 10*3/uL (ref 0.7–3.1)
Lymphs: 34 %
MCH: 29.1 pg (ref 26.6–33.0)
MCHC: 32.4 g/dL (ref 31.5–35.7)
MCV: 90 fL (ref 79–97)
Monocytes Absolute: 0.7 10*3/uL (ref 0.1–0.9)
Monocytes: 12 %
Neutrophils Absolute: 3 10*3/uL (ref 1.4–7.0)
Neutrophils: 51 %
Platelets: 270 10*3/uL (ref 150–450)
RBC: 4.33 x10E6/uL (ref 3.77–5.28)
RDW: 13 % (ref 11.7–15.4)
WBC: 6 10*3/uL (ref 3.4–10.8)

## 2019-10-29 LAB — BASIC METABOLIC PANEL
BUN/Creatinine Ratio: 16 (ref 12–28)
BUN: 12 mg/dL (ref 8–27)
CO2: 19 mmol/L — ABNORMAL LOW (ref 20–29)
Calcium: 8.5 mg/dL — ABNORMAL LOW (ref 8.7–10.3)
Chloride: 104 mmol/L (ref 96–106)
Creatinine, Ser: 0.73 mg/dL (ref 0.57–1.00)
GFR calc Af Amer: 97 mL/min/{1.73_m2} (ref >59)
GFR calc non Af Amer: 84 mL/min/{1.73_m2} (ref >59)
Glucose: 103 mg/dL — ABNORMAL HIGH (ref 65–99)
Potassium: 4.3 mmol/L (ref 3.5–5.2)
Sodium: 134 mmol/L (ref 134–144)

## 2019-10-29 NOTE — Telephone Encounter (Signed)
Jenna Campbell is calling requesting the number to the location of the office Dr. Geraldo Pitter is wanting her Vas [place] Upper Extremity Arterial Duplex done. Please advise.

## 2019-10-29 NOTE — Telephone Encounter (Signed)
Phone number for vascular in [place] given to pt to schedule.

## 2019-10-29 NOTE — Telephone Encounter (Signed)
Spoke with patient regarding results and recommendation.  Patient verbalizes understanding and is agreeable to plan of care. Advised patient to call back with any issues or concerns.  

## 2019-10-29 NOTE — Telephone Encounter (Signed)
-----   Message from Jenean Lindau, MD sent at 10/29/2019  8:22 AM EDT ----- The results of the study is unremarkable. Please inform patient. I will discuss in detail at next appointment. Cc  primary care/referring physician Jenean Lindau, MD 10/29/2019 8:21 AM

## 2019-11-04 ENCOUNTER — Ambulatory Visit (HOSPITAL_COMMUNITY)
Admission: RE | Admit: 2019-11-04 | Discharge: 2019-11-04 | Disposition: A | Discharge status: Home / Self Care | Payer: PPO | Source: Ambulatory Visit | Attending: Cardiology | Admitting: Cardiology

## 2019-11-04 ENCOUNTER — Other Ambulatory Visit: Payer: Self-pay

## 2019-11-04 ENCOUNTER — Telehealth: Payer: PPO | Admitting: Cardiology

## 2019-11-04 DIAGNOSIS — M25532 Pain in left wrist: Secondary | ICD-10-CM | POA: Diagnosis not present

## 2019-11-04 NOTE — Progress Notes (Signed)
Right upper extremity arterial duplex has been completed. Preliminary results can be found in CV Proc through chart review.   11/04/19 2:20 PM Jenna Campbell RVT

## 2019-11-05 ENCOUNTER — Telehealth: Payer: PPO | Admitting: Cardiology

## 2019-11-11 DIAGNOSIS — F4322 Adjustment disorder with anxiety: Secondary | ICD-10-CM | POA: Diagnosis not present

## 2019-11-11 DIAGNOSIS — Z79899 Other long term (current) drug therapy: Secondary | ICD-10-CM | POA: Diagnosis not present

## 2019-11-11 DIAGNOSIS — F330 Major depressive disorder, recurrent, mild: Secondary | ICD-10-CM | POA: Diagnosis not present

## 2019-11-11 DIAGNOSIS — F449 Dissociative and conversion disorder, unspecified: Secondary | ICD-10-CM | POA: Diagnosis not present

## 2019-11-12 ENCOUNTER — Encounter: Payer: Self-pay | Admitting: Cardiology

## 2019-11-12 ENCOUNTER — Other Ambulatory Visit: Payer: Self-pay

## 2019-11-12 ENCOUNTER — Ambulatory Visit: Payer: PPO | Admitting: Cardiology

## 2019-11-12 VITALS — BP 134/84 | HR 64 | Ht 67.0 in | Wt 151.0 lb

## 2019-11-12 DIAGNOSIS — I2510 Atherosclerotic heart disease of native coronary artery without angina pectoris: Secondary | ICD-10-CM

## 2019-11-12 DIAGNOSIS — I10 Essential (primary) hypertension: Secondary | ICD-10-CM

## 2019-11-12 DIAGNOSIS — E782 Mixed hyperlipidemia: Secondary | ICD-10-CM

## 2019-11-12 HISTORY — DX: Mixed hyperlipidemia: E78.2

## 2019-11-12 MED ORDER — NITROGLYCERIN 0.4 MG SL SUBL: 0.4000 mg | SUBLINGUAL_TABLET | SUBLINGUAL | 3 refills | Status: DC | PRN

## 2019-11-12 NOTE — Progress Notes (Signed)
Cardiology Office Note:    Date:  11/12/2019   ID:  Jenna Campbell, DOB [birth year], MRN [id number]  PCP:  Shelda Pal, DO  Cardiologist:  Jenean Lindau, MD   Referring MD: Shelda Pal*    ASSESSMENT:    1. Coronary artery disease involving native coronary artery of native heart without angina pectoris   2. Essential hypertension   3. Mixed dyslipidemia    PLAN:    In order of problems listed above:  1. Coronary artery disease: Secondary prevention stressed with the patient.  Importance of compliance with diet medication stressed and she vocalized understanding.  She is following a good exercise program half an hour of walking 5 days a week. 2. Essential hypertension: Blood pressure is stable and she is following diet well 3. Mixed dyslipidemia: Patient is doing well in taking statins and will be back in 3 months for liver lipid check in 6 months for follow-up appointment.  She requested nitroglycerin prescription and it was sent.  Its use was discussed again.   Medication Adjustments/Labs and Tests Ordered: Current medicines are reviewed at length with the patient today.  Concerns regarding medicines are outlined above.  Orders Placed This Encounter  Procedures  . Basic metabolic panel  . Hepatic function panel  . Lipid panel   Meds ordered this encounter  Medications  . nitroGLYCERIN (NITROSTAT) 0.4 MG SL tablet    Sig: Place 1 tablet (0.4 mg total) under the tongue every 5 (five) minutes as needed for chest pain.    Dispense:  25 tablet    Refill:  3     No chief complaint on file.    History of Present Illness:    Jenna Campbell is a 69 y.o. female.  Patient has past medical history of coronary artery disease and mixed dyslipidemia.  She denies any problems at this time and takes care of activities of daily living.  No chest pain orthopnea or PND.  At the time of my evaluation, the patient is alert awake oriented and in no distress.  She  walks on a regular basis now.  Past Medical History:  Diagnosis Date  . Acute bronchitis due to other specified organisms 04/21/2015  . Acute tension-type headache 12/16/2015  . Adjustment disorder with anxious mood 04/21/2015  . Allergic asthma with acute exacerbation 09/22/2014  . Allergic rhinitis due to pollen 07/23/2014  . Allergic urticaria 05/13/2015  . Allergy   . Alopecia 10/30/2013  . Angina pectoris ([place]) 07/29/2019  . Arthritis 04/24/2016  . Asthma   . Atrophic vaginitis 10/30/2013  . Basal cell carcinoma 04/21/2015  . Bilateral impacted cerumen 04/21/2015  . Cancer ([place])    skin cancer  . Cervical radiculopathy 09/17/2015  . Change in bowel function 06/14/2016  . Chest pain in adult 05/28/2017  . Chronic diarrhea 12/16/2015  . Colon polyps   . Cough 02/14/2016  . Cystocele, midline 10/30/2013  . Dermatitis 09/22/2014  . 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 Diverticulosis of large intestine 10/30/2013  . Dupuytren's contracture of both hands 09/17/2019  . Dyspareunia 07/23/2014  . Dysuria 12/16/2015  . Encounter for long-term (current) use of other medications 10/19/2012  . Esophageal dysphagia 06/14/2016  . Essential hypertension   . Family history of pheochromocytoma 02/14/2016  . Gastroesophageal reflux disease without esophagitis 05/13/2015  . Generalized abdominal pain 12/16/2015  . GERD (gastroesophageal reflux disease)   . H/O: hysterectomy   . Herpes simplex 08/17/2016  . History of adenomatous polyp of colon  06/14/2016  . History of blood transfusion 1970  . Hx of migraines 05/15/2009  . Hypothyroidism 10/30/2013  . Hypotonic bladder 10/30/2013  . Localized edema 12/16/2015  . Low back pain 09/17/2015  . Lumbar paraspinal muscle spasm 04/21/2015  . Major depression, recurrent ([place]) 07/14/2013  . Malaise and fatigue 04/21/2015  . MCI (mild cognitive impairment) 08/18/2015  . Memory difficulty 09/17/2015  . Menopause 03/31/2014  . Midline low back pain without sciatica 03/31/2014  . Migraine    . Mild intermittent extrinsic asthma 12/16/2015  . Mild persistent asthma 05/13/2015  . Neck pain 09/17/2015  . Neuropathy 09/22/2014  . Nonintractable headache 11/02/2016  . Other allergic rhinitis 05/13/2015  . Pelvic pain in female 02/17/2015  . Personality disorder ([place]) 10/19/2012  . Pharyngoesophageal dysphagia 04/21/2015  . Plantar fasciitis 01/20/2016  . Precordial chest pain 08/18/2015  . Prolapse of vaginal vault after hysterectomy 10/30/2013  . Rectocele 10/30/2013  . Shin splint 04/21/2015  . Shortness of breath 07/17/2016  . Sinusitis, chronic 10/30/2013  . Slow transit constipation 07/23/2014  . Swelling of lower limb 05/17/2016  . Tension-type headache, not intractable 09/17/2015  . Thyroid disease   . Urethral stricture 10/30/2013  . Urinary incontinence   . Urinary incontinence without sensory awareness 07/17/2016  . Varicose veins of both lower extremities 04/24/2016  . Venous insufficiency (chronic) (peripheral) 06/06/2016    Past Surgical History:  Procedure Laterality Date  . BREAST CYST ASPIRATION Left    25 years ago   . INTRAVASCULAR PRESSURE WIRE/FFR STUDY N/A 10/17/2019   Procedure: INTRAVASCULAR PRESSURE WIRE/FFR STUDY;  Surgeon: Nelva Bush, MD;  Location: [facility];  Service: Cardiovascular;  Laterality: N/A;  . LEFT HEART CATH AND CORONARY ANGIOGRAPHY N/A 10/17/2019   Procedure: LEFT HEART CATH AND CORONARY ANGIOGRAPHY;  Surgeon: Nelva Bush, MD;  Location: [facility];  Service: Cardiovascular;  Laterality: N/A;  . MOHS SURGERY  10/05/2015  . RECTOCELE REPAIR  2011  . SKIN CANCER EXCISION  02/2015   Squamous cell removed from back  . TONSILLECTOMY    . VAGINAL HYSTERECTOMY    . VAGINAL PROLAPSE REPAIR      Current Medications: Current Meds  Medication Sig  . albuterol (PROVENTIL) (2.5 MG/3ML) 0.083% nebulizer solution Take 3 mLs (2.5 mg total) by nebulization every 6 (six) hours as needed for wheezing or shortness of breath.  . ASPIRIN 81 PO  Take by mouth.  Marland Kitchen azelastine (ASTELIN) 0.1 % nasal spray Place 2 sprays into both nostrils 2 (two) times daily. Use in each nostril as directed  . betamethasone valerate (VALISONE) 0.1 % cream APPLY TO AFFECTED AREA TWICE A DAY (Patient taking differently: Apply 1 application topically 2 (two) times daily. )  . clindamycin (CLEOCIN) 300 MG capsule Take 1 capsule (300 mg total) by mouth 3 (three) times daily.  . fluticasone (FLONASE) 50 MCG/ACT nasal spray Place 2 sprays into both nostrils daily.  . Fluticasone-Salmeterol (ADVAIR DISKUS) 250-50 MCG/DOSE AEPB Inhale 1 puff into the lungs in the morning and at bedtime.  . gabapentin (NEURONTIN) 300 MG capsule Take 300-600 mg by mouth See admin instructions. 300 mg in the morning, 600 mg at bedtime  . isosorbide mononitrate (IMDUR) 30 MG 24 hr tablet Take 0.5 tablets (15 mg total) by mouth daily.  Marland Kitchen levocetirizine (XYZAL) 5 MG tablet TAKE 1 TABLET BY MOUTH EVERY DAY IN THE EVENING  . levothyroxine (SYNTHROID) 150 MCG tablet TAKE 1 & 1/2 TABLETS BY MOUTH DAILY BEFORE BREAKFAST (Patient  taking differently: Take 225 mcg by mouth daily before breakfast. )  . LORazepam (ATIVAN) 0.5 MG tablet Take 0.5 mg by mouth every 8 (eight) hours as needed for anxiety.   . nitroGLYCERIN (NITROSTAT) 0.4 MG SL tablet Place 1 tablet (0.4 mg total) under the tongue every 5 (five) minutes as needed for chest pain.  Marland Kitchen olopatadine (PATANOL) 0.1 % ophthalmic solution Place 1 drop into both eyes 2 (two) times daily.  . ondansetron (ZOFRAN-ODT) 4 MG disintegrating tablet Take 1 tablet (4 mg total) by mouth every 8 (eight) hours as needed for nausea or vomiting.  . pantoprazole (PROTONIX) 40 MG tablet Take 1 tablet (40 mg total) by mouth daily.  . promethazine-dextromethorphan (PROMETHAZINE-DM) 6.25-15 MG/5ML syrup Take 5 mLs by mouth 4 (four) times daily as needed. (Patient taking differently: Take 5 mLs by mouth 4 (four) times daily as needed for cough. )  . rosuvastatin (CRESTOR)  10 MG tablet Take 1 tablet (10 mg total) by mouth daily.  . temazepam (RESTORIL) 15 MG capsule Take 15 mg by mouth at bedtime.   . topiramate (TOPAMAX) 100 MG tablet Take 100 mg by mouth at bedtime.   . valACYclovir (VALTREX) 500 MG tablet Take 2 tabs daily for 5 days when you have an outbreak. (Patient taking differently: Take 1,000 mg by mouth See admin instructions. Take 2 tabs daily for 5 days when you have an outbreak.)  . [DISCONTINUED] nitroGLYCERIN (NITROSTAT) 0.4 MG SL tablet Place 1 tablet (0.4 mg total) under the tongue every 5 (five) minutes as needed for chest pain.     Allergies:   Iodinated diagnostic agents, Kenalog [triamcinolone acetonide], Metrizamide, Other, Penicillins, Montelukast sodium, Ciprofloxacin, Diazepam, and Sulfa antibiotics   Social History   Socioeconomic History  . Marital status: Divorced    Spouse name: Not on file  . Number of children: Not on file  . Years of education: Not on file  . Highest education level: Not on file  Occupational History  . Not on file  Tobacco Use  . Smoking status: Never Smoker  . Smokeless tobacco: Never Used  Vaping Use  . Vaping Use: Never used  Substance and Sexual Activity  . Alcohol use: Yes  . Drug use: No  . Sexual activity: Never    Partners: Male  Other Topics Concern  . Not on file  Social History Narrative  . Not on file   Social Determinants of Health   Financial Resource Strain:   . Difficulty of Paying Living Expenses:   Food Insecurity:   . Worried About Charity fundraiser in the Last Year:   . Arboriculturist in the Last Year:   Transportation Needs:   . Film/video editor (Medical):   Marland Kitchen Lack of Transportation (Non-Medical):   Physical Activity:   . Days of Exercise per Week:   . Minutes of Exercise per Session:   Stress:   . Feeling of Stress :   Social Connections:   . Frequency of Communication with Friends and Family:   . Frequency of Social Gatherings with Friends and Family:    . Attends Religious Services:   . Active Member of Clubs or Organizations:   . Attends Archivist Meetings:   Marland Kitchen Marital Status:      Family History: The patient's family history includes Alzheimer's disease in her mother; Angina in her father; Asthma in her father; Cancer in her sister; Emphysema in her father.  ROS:   Please see  the history of present illness.    All other systems reviewed and are negative.  EKGs/Labs/Other Studies Reviewed:    The following studies were reviewed today: I discussed my findings with patient at extensive length   Recent Labs: 07/27/2019: B Natriuretic Peptide 45.5 10/09/2019: ALT 20 10/15/2019: TSH 1.76 10/28/2019: BUN 12; Creatinine, Ser 0.73; Hemoglobin 12.6; Platelets 270; Potassium 4.3; Sodium 134  Recent Lipid Panel    Component Value Date/Time   CHOL 211 (H) 10/09/2019 0928   TRIG 256 (H) 10/09/2019 0928   HDL 37 (L) 10/09/2019 0928   CHOLHDL 5.7 (H) 10/09/2019 0928   LDLCALC 128 (H) 10/09/2019 5072    Physical Exam:    VS:  BP 134/84   Pulse 64   Ht 5\' 7"  (1.702 m)   Wt 151 lb (68.5 kg)   SpO2 98%   BMI 23.65 kg/m     Wt Readings from Last 3 Encounters:  11/12/19 151 lb (68.5 kg)  10/28/19 149 lb (67.6 kg)  10/17/19 147 lb (66.7 kg)     GEN: Patient is in no acute distress HEENT: Normal NECK: No JVD; No carotid bruits LYMPHATICS: No lymphadenopathy CARDIAC: Hear sounds regular, 2/6 systolic murmur at the apex. RESPIRATORY:  Clear to auscultation without rales, wheezing or rhonchi  ABDOMEN: Soft, non-tender, non-distended MUSCULOSKELETAL:  No edema; No deformity  SKIN: Warm and dry NEUROLOGIC:  Alert and oriented x 3 PSYCHIATRIC:  Normal affect   Signed, Jenean Lindau, MD  11/12/2019 3:01 PM    [facility]

## 2019-11-12 NOTE — Patient Instructions (Addendum)
Medication Instructions:  No medication changes. *If you need a refill on your cardiac medications before your next appointment, please call your pharmacy*   Lab Work: Your physician recommends that you return for lab work in: 3 months (02/12/20)  You need to have labs done when you are fasting.  You can come Monday through Friday 8:30 am to 12:00 pm and 1:15 to 4:30. You do not need to make an appointment as the order has already been placed. The labs you are going to have done are BMET, LFT and Lipids.   If you have labs (blood work) drawn today and your tests are completely normal, you will receive your results only by:  [place] (if you have MyChart) OR  A paper copy in the mail If you have any lab test that is abnormal or we need to change your treatment, we will call you to review the results.   Testing/Procedures: None ordered   Follow-Up: At [facility], you and your health needs are our priority.  As part of our continuing mission to provide you with exceptional heart care, we have created designated Provider Care Teams.  These Care Teams include your primary Cardiologist (physician) and Advanced Practice Providers (APPs -  Physician Assistants and Nurse Practitioners) who all work together to provide you with the care you need, when you need it.  We recommend signing up for the patient portal called "MyChart".  Sign up information is provided on this After Visit Summary.  MyChart is used to connect with patients for Virtual Visits (Telemedicine).  Patients are able to view lab/test results, encounter notes, upcoming appointments, etc.  Non-urgent messages can be sent to your provider as well.   To learn more about what you can do with MyChart, go to [email or web address].    Your next appointment:   6 month(s)  The format for your next appointment:   In Person  Provider:   Jyl Heinz, MD   Other Instructions NA

## 2019-11-24 ENCOUNTER — Encounter: Payer: Self-pay | Admitting: Family Medicine

## 2019-11-24 ENCOUNTER — Ambulatory Visit (INDEPENDENT_AMBULATORY_CARE_PROVIDER_SITE_OTHER): Payer: PPO | Admitting: Family Medicine

## 2019-11-24 ENCOUNTER — Other Ambulatory Visit: Payer: Self-pay

## 2019-11-24 VITALS — BP 122/68 | HR 63 | Temp 98.0°F | Ht 67.0 in | Wt 150.1 lb

## 2019-11-24 DIAGNOSIS — K0889 Other specified disorders of teeth and supporting structures: Secondary | ICD-10-CM | POA: Diagnosis not present

## 2019-11-24 DIAGNOSIS — L304 Erythema intertrigo: Secondary | ICD-10-CM | POA: Diagnosis not present

## 2019-11-24 MED ORDER — MOMETASONE FUROATE 50 MCG/ACT NA SUSP
2.0000 | Freq: Every day | NASAL | 12 refills | Status: DC
Start: 2019-11-24 — End: 2020-04-20

## 2019-11-24 MED ORDER — NAPROXEN 500 MG PO TBEC: 500.0000 mg | DELAYED_RELEASE_TABLET | Freq: Two times a day (BID) | ORAL | 0 refills | Status: AC

## 2019-11-24 MED ORDER — NYSTATIN 100000 UNIT/GM EX CREA: 1.0000 "application " | TOPICAL_CREAM | Freq: Two times a day (BID) | CUTANEOUS | 0 refills | Status: AC

## 2019-11-24 NOTE — Progress Notes (Addendum)
Chief Complaint  Patient presents with  . Rash    Jenna Campbell is a 69 y.o. female here for a skin complaint.  Duration: 1 week Location: R armpit region Pruritic? Yes Painful? Yes Drainage? No New soaps/lotions/topicals/detergents? No Sick contacts? No Other associated symptoms: none Therapies tried thus far: none  +R dental pain on lower jaw since dentures refitted. Worse lately. No drainage or swelling. Taking Tylenol w little relief.   Past Medical History:  Diagnosis Date  . Acute bronchitis due to other specified organisms 04/21/2015  . Acute tension-type headache 12/16/2015  . Adjustment disorder with anxious mood 04/21/2015  . Allergic asthma with acute exacerbation 09/22/2014  . Allergic rhinitis due to pollen 07/23/2014  . Allergic urticaria 05/13/2015  . Allergy   . Alopecia 10/30/2013  . Angina pectoris ([place]) 07/29/2019  . Arthritis 04/24/2016  . Asthma   . Atrophic vaginitis 10/30/2013  . Basal cell carcinoma 04/21/2015  . Bilateral impacted cerumen 04/21/2015  . Cancer ([place])    skin cancer  . Cervical radiculopathy 09/17/2015  . Change in bowel function 06/14/2016  . Chest pain in adult 05/28/2017  . Chronic diarrhea 12/16/2015  . Colon polyps   . Cough 02/14/2016  . Cystocele, midline 10/30/2013  . Dermatitis 09/22/2014  . 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 Diverticulosis of large intestine 10/30/2013  . Dupuytren's contracture of both hands 09/17/2019  . Dyspareunia 07/23/2014  . Dysuria 12/16/2015  . Encounter for long-term (current) use of other medications 10/19/2012  . Esophageal dysphagia 06/14/2016  . Essential hypertension   . Family history of pheochromocytoma 02/14/2016  . Gastroesophageal reflux disease without esophagitis 05/13/2015  . Generalized abdominal pain 12/16/2015  . GERD (gastroesophageal reflux disease)   . H/O: hysterectomy   . Herpes simplex 08/17/2016  . History of adenomatous polyp of colon 06/14/2016  . History of blood transfusion 1970  . Hx of  migraines 05/15/2009  . Hypothyroidism 10/30/2013  . Hypotonic bladder 10/30/2013  . Localized edema 12/16/2015  . Low back pain 09/17/2015  . Lumbar paraspinal muscle spasm 04/21/2015  . Major depression, recurrent ([place]) 07/14/2013  . Malaise and fatigue 04/21/2015  . MCI (mild cognitive impairment) 08/18/2015  . Memory difficulty 09/17/2015  . Menopause 03/31/2014  . Midline low back pain without sciatica 03/31/2014  . Migraine   . Mild intermittent extrinsic asthma 12/16/2015  . Mild persistent asthma 05/13/2015  . Neck pain 09/17/2015  . Neuropathy 09/22/2014  . Nonintractable headache 11/02/2016  . Other allergic rhinitis 05/13/2015  . Pelvic pain in female 02/17/2015  . Personality disorder ([place]) 10/19/2012  . Pharyngoesophageal dysphagia 04/21/2015  . Plantar fasciitis 01/20/2016  . Precordial chest pain 08/18/2015  . Prolapse of vaginal vault after hysterectomy 10/30/2013  . Rectocele 10/30/2013  . Shin splint 04/21/2015  . Shortness of breath 07/17/2016  . Sinusitis, chronic 10/30/2013  . Slow transit constipation 07/23/2014  . Swelling of lower limb 05/17/2016  . Tension-type headache, not intractable 09/17/2015  . Thyroid disease   . Urethral stricture 10/30/2013  . Urinary incontinence   . Urinary incontinence without sensory awareness 07/17/2016  . Varicose veins of both lower extremities 04/24/2016  . Venous insufficiency (chronic) (peripheral) 06/06/2016    BP 122/68 (BP Location: Left Arm, Patient Position: Sitting, Cuff Size: Normal)   Pulse 63   Temp 98 F (36.7 C) (Oral)   Ht 5\' 7"  (1.702 m)   Wt 150 lb 2 oz (68.1 kg)   SpO2 98%   BMI 23.51 kg/m  Gen:  awake, alert, appearing stated age Lungs: No accessory muscle use Skin: linear area patch of erythema in the R axillary region. No drainage, scaling, TTP, fluctuance, excoriation Mouth: MMM, multiple missing teeth, lone R sided molar mandibular with decay, no signs of edema, erythema, drainage Psych: Nml mood  Intertrigo - Plan: nystatin  cream (MYCOSTATIN)  Pain, dental - Plan: naproxen (EC NAPROSYN) 500 MG EC tablet  Orders as above, tx for yeast. Keep dry.  F/u prn. The patient voiced understanding and agreement to the plan.  [place], DO 11/24/19 11:33 AM

## 2019-11-24 NOTE — Patient Instructions (Addendum)
Try to keep things clean and dry.   Let us know if you need anything.   Intertrigo Intertrigo is skin irritation or inflammation (dermatitis) that occurs when folds of skin rub together. The irritation can cause a rash and make skin raw and itchy. This condition most commonly occurs in the skin folds of these areas:  Toes.  Armpits.  Groin.  Under the belly.  Under the breasts.  Buttocks. Intertrigo is not passed from person to person (is not contagious). What are the causes? This condition is caused by heat, moisture, rubbing (friction), and not enough air circulation. The condition can be made worse by:  Sweat.  Bacteria.  A fungus, such as yeast. What increases the risk? This condition is more likely to occur if you have moisture in your skin folds. You are more likely to develop this condition if you:  Have diabetes.  Are overweight.  Are not able to move around or are not active.  Live in a warm and moist climate.  Wear splints, braces, or other medical devices.  Are not able to control your bowels or bladder (have incontinence). What are the signs or symptoms? Symptoms of this condition include:  A pink or red skin rash in the skin fold or near the skin fold.  Raw or scaly skin.  Itchiness.  A burning feeling.  Bleeding.  Leaking fluid.  A bad smell. How is this diagnosed? This condition is diagnosed with a medical history and physical exam. You may also have a skin swab to test for bacteria or a fungus. How is this treated? This condition may be treated by:  Cleaning and drying your skin.  Taking an antibiotic medicine or using an antibiotic skin cream for a bacterial infection.  Using an antifungal cream on your skin or taking pills for an infection that was caused by a fungus, such as yeast.  Using a steroid ointment to relieve itchiness and irritation.  Separating the skin fold with a clean cotton cloth to absorb moisture and allow air  to flow into the area. Follow these instructions at home:  Keep the affected area clean and dry.  Do not scratch your skin.  Stay in a cool environment as much as possible. Use an air conditioner or fan, if available.  Apply over-the-counter and prescription medicines only as told by your health care provider.  If you were prescribed an antibiotic medicine, use it as told by your health care provider. Do not stop using the antibiotic even if your condition improves.  Keep all follow-up visits as told by your health care provider. This is important. How is this prevented?   Maintain a healthy weight.  Take care of your feet, especially if you have diabetes. Foot care includes: ? Wearing shoes that fit well. ? Keeping your feet dry. ? Wearing clean, breathable socks.  Protect the skin around your groin and buttocks, especially if you have incontinence. Skin protection includes: ? Following a regular cleaning routine. ? Using skin protectant creams, powders, or ointments. ? Changing protection pads frequently.  Do not wear tight clothes. Wear clothes that are loose, absorbent, and made of cotton.  Wear a bra that gives good support, if needed.  Shower and dry yourself well after activity or exercise. Use a hair dryer on a cool setting to dry between skin folds, especially after you bathe.  If you have diabetes, keep your blood sugar under control. Contact a health care provider if:  Your symptoms do  not improve with treatment.  Your symptoms get worse or they spread.  You notice increased redness and warmth.  You have a fever. Summary  Intertrigo is skin irritation or inflammation (dermatitis) that occurs when folds of skin rub together.  This condition is caused by heat, moisture, rubbing (friction), and not enough air circulation.  This condition may be treated by cleaning and drying your skin and with medicines.  Apply over-the-counter and prescription medicines  only as told by your health care provider.  Keep all follow-up visits as told by your health care provider. This is important. This information is not intended to replace advice given to you by your health care provider. Make sure you discuss any questions you have with your health care provider. Document Revised: 10/01/2017 Document Reviewed: 10/01/2017 Elsevier Patient Education  [place].

## 2019-11-24 NOTE — Addendum Note (Signed)
Addended by: Ames Coupe on: 11/24/2019 11:49 AM   Modules accepted: Orders, Level of Service

## 2019-11-25 ENCOUNTER — Ambulatory Visit: Payer: PPO | Admitting: Family Medicine

## 2019-12-03 DIAGNOSIS — R413 Other amnesia: Secondary | ICD-10-CM | POA: Diagnosis not present

## 2019-12-03 DIAGNOSIS — M545 Low back pain: Secondary | ICD-10-CM | POA: Diagnosis not present

## 2019-12-03 DIAGNOSIS — G44209 Tension-type headache, unspecified, not intractable: Secondary | ICD-10-CM | POA: Diagnosis not present

## 2019-12-03 DIAGNOSIS — M542 Cervicalgia: Secondary | ICD-10-CM | POA: Diagnosis not present

## 2019-12-04 DIAGNOSIS — F332 Major depressive disorder, recurrent severe without psychotic features: Secondary | ICD-10-CM | POA: Diagnosis not present

## 2019-12-09 ENCOUNTER — Other Ambulatory Visit: Payer: Self-pay

## 2020-01-02 DIAGNOSIS — E782 Mixed hyperlipidemia: Secondary | ICD-10-CM

## 2020-01-02 DIAGNOSIS — I2510 Atherosclerotic heart disease of native coronary artery without angina pectoris: Secondary | ICD-10-CM

## 2020-01-08 NOTE — Addendum Note (Signed)
Addended by: Truddie Hidden on: 01/08/2020 05:29 PM   Modules accepted: Orders

## 2020-01-08 NOTE — Telephone Encounter (Signed)
How do you advise?

## 2020-01-09 DIAGNOSIS — E782 Mixed hyperlipidemia: Secondary | ICD-10-CM | POA: Diagnosis not present

## 2020-01-09 DIAGNOSIS — I2510 Atherosclerotic heart disease of native coronary artery without angina pectoris: Secondary | ICD-10-CM | POA: Diagnosis not present

## 2020-01-10 LAB — HEPATIC FUNCTION PANEL
ALT: 13 IU/L (ref 0–32)
AST: 17 IU/L (ref 0–40)
Albumin: 3.9 g/dL (ref 3.8–4.8)
Alkaline Phosphatase: 65 IU/L (ref 48–121)
Bilirubin Total: 0.3 mg/dL (ref 0.0–1.2)
Bilirubin, Direct: 0.11 mg/dL (ref 0.00–0.40)
Total Protein: 6.4 g/dL (ref 6.0–8.5)

## 2020-01-10 LAB — CK: Total CK: 48 U/L (ref 32–182)

## 2020-01-12 DIAGNOSIS — F332 Major depressive disorder, recurrent severe without psychotic features: Secondary | ICD-10-CM | POA: Diagnosis not present

## 2020-01-14 MED ORDER — ATORVASTATIN CALCIUM 10 MG PO TABS
10.0000 mg | ORAL_TABLET | Freq: Every day | ORAL | 3 refills | Status: DC
Start: 2020-01-14 — End: 2020-05-21

## 2020-01-14 MED ORDER — CO Q-10 200 MG PO CAPS: 200.0000 mg | ORAL_CAPSULE | Freq: Every day | ORAL | 3 refills | Status: DC

## 2020-01-14 NOTE — Addendum Note (Signed)
Addended by: Truddie Hidden on: 01/14/2020 07:51 AM   Modules accepted: Orders

## 2020-01-14 NOTE — Addendum Note (Signed)
Addended by: Truddie Hidden on: 01/14/2020 07:49 AM   Modules accepted: Orders

## 2020-01-22 DIAGNOSIS — F332 Major depressive disorder, recurrent severe without psychotic features: Secondary | ICD-10-CM | POA: Diagnosis not present

## 2020-02-02 DIAGNOSIS — F449 Dissociative and conversion disorder, unspecified: Secondary | ICD-10-CM | POA: Diagnosis not present

## 2020-02-02 DIAGNOSIS — Z79899 Other long term (current) drug therapy: Secondary | ICD-10-CM | POA: Diagnosis not present

## 2020-02-02 DIAGNOSIS — F330 Major depressive disorder, recurrent, mild: Secondary | ICD-10-CM | POA: Diagnosis not present

## 2020-02-02 DIAGNOSIS — F4322 Adjustment disorder with anxiety: Secondary | ICD-10-CM | POA: Diagnosis not present

## 2020-02-13 ENCOUNTER — Ambulatory Visit: Payer: PPO | Admitting: Cardiology

## 2020-02-18 DIAGNOSIS — F332 Major depressive disorder, recurrent severe without psychotic features: Secondary | ICD-10-CM | POA: Diagnosis not present

## 2020-03-14 ENCOUNTER — Other Ambulatory Visit: Payer: Self-pay | Admitting: Cardiology

## 2020-03-23 ENCOUNTER — Telehealth (INDEPENDENT_AMBULATORY_CARE_PROVIDER_SITE_OTHER): Payer: PPO | Admitting: Family Medicine

## 2020-03-23 ENCOUNTER — Telehealth: Payer: Self-pay

## 2020-03-23 ENCOUNTER — Other Ambulatory Visit: Payer: Self-pay

## 2020-03-23 ENCOUNTER — Encounter: Payer: Self-pay | Admitting: Family Medicine

## 2020-03-23 DIAGNOSIS — J4540 Moderate persistent asthma, uncomplicated: Secondary | ICD-10-CM

## 2020-03-23 DIAGNOSIS — J302 Other seasonal allergic rhinitis: Secondary | ICD-10-CM | POA: Diagnosis not present

## 2020-03-23 DIAGNOSIS — R300 Dysuria: Secondary | ICD-10-CM

## 2020-03-23 MED ORDER — NITROFURANTOIN MONOHYD MACRO 100 MG PO CAPS: 100.0000 mg | ORAL_CAPSULE | Freq: Two times a day (BID) | ORAL | 0 refills | Status: AC

## 2020-03-23 NOTE — Telephone Encounter (Signed)
See my chart message

## 2020-03-23 NOTE — Progress Notes (Signed)
Chief Complaint  Patient presents with  . Breathing Problem    Subjective: Patient is a 69 y.o. female here for breathing issues. Due to COVID-19 pandemic, we are interacting via telephone. I verified patient's ID using 2 identifiers. Patient agreed to proceed with visit via this method. Patient is at home, I am at office. Patient and I are present for visit.   Pt w hx of seasonal allergies on Xyzal and Nasonex. She is not fully compliant with the Xyzal and it does help with her seasonal allergies and nasal congestion. No fevers or sick contacts. She has a hx of asthma, where her allergies serve as a trigger. She is compliant w Advair 250-50 mcg 2 puff bid. She is coughing and having slight sob.   She has had dysuria for around 1 week. She has constipation, but thinks it may be more infection as she has been treating the former. No bleeding, urgency, freq, flank pain, fevers, d/c.  Past Medical History:  Diagnosis Date  . Acute bronchitis due to other specified organisms 04/21/2015  . Acute tension-type headache 12/16/2015  . Adjustment disorder with anxious mood 04/21/2015  . Allergic asthma with acute exacerbation 09/22/2014  . Allergic rhinitis due to pollen 07/23/2014  . Allergic urticaria 05/13/2015  . Allergy   . Alopecia 10/30/2013  . Angina pectoris ([place]) 07/29/2019  . Arthritis 04/24/2016  . Asthma   . Atrophic vaginitis 10/30/2013  . Basal cell carcinoma 04/21/2015  . Bilateral impacted cerumen 04/21/2015  . Cancer ([place])    skin cancer  . Cervical radiculopathy 09/17/2015  . Change in bowel function 06/14/2016  . Chest pain in adult 05/28/2017  . Chronic diarrhea 12/16/2015  . Colon polyps   . Cough 02/14/2016  . Cystocele, midline 10/30/2013  . Dermatitis 09/22/2014  . 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 Diverticulosis of large intestine 10/30/2013  . Dupuytren's contracture of both hands 09/17/2019  . Dyspareunia 07/23/2014  . Dysuria 12/16/2015  . Encounter for long-term (current) use of  other medications 10/19/2012  . Esophageal dysphagia 06/14/2016  . Essential hypertension   . Family history of pheochromocytoma 02/14/2016  . Gastroesophageal reflux disease without esophagitis 05/13/2015  . Generalized abdominal pain 12/16/2015  . GERD (gastroesophageal reflux disease)   . H/O: hysterectomy   . Herpes simplex 08/17/2016  . History of adenomatous polyp of colon 06/14/2016  . History of blood transfusion 1970  . Hx of migraines 05/15/2009  . Hypothyroidism 10/30/2013  . Hypotonic bladder 10/30/2013  . Localized edema 12/16/2015  . Low back pain 09/17/2015  . Lumbar paraspinal muscle spasm 04/21/2015  . Major depression, recurrent ([place]) 07/14/2013  . Malaise and fatigue 04/21/2015  . MCI (mild cognitive impairment) 08/18/2015  . Memory difficulty 09/17/2015  . Menopause 03/31/2014  . Midline low back pain without sciatica 03/31/2014  . Migraine   . Mild intermittent extrinsic asthma 12/16/2015  . Mild persistent asthma 05/13/2015  . Neck pain 09/17/2015  . Neuropathy 09/22/2014  . Nonintractable headache 11/02/2016  . Other allergic rhinitis 05/13/2015  . Pelvic pain in female 02/17/2015  . Personality disorder ([place]) 10/19/2012  . Pharyngoesophageal dysphagia 04/21/2015  . Plantar fasciitis 01/20/2016  . Precordial chest pain 08/18/2015  . Prolapse of vaginal vault after hysterectomy 10/30/2013  . Rectocele 10/30/2013  . Shin splint 04/21/2015  . Shortness of breath 07/17/2016  . Sinusitis, chronic 10/30/2013  . Slow transit constipation 07/23/2014  . Swelling of lower limb 05/17/2016  . Tension-type headache, not intractable 09/17/2015  . Thyroid  disease   . Urethral stricture 10/30/2013  . Urinary incontinence   . Urinary incontinence without sensory awareness 07/17/2016  . Varicose veins of both lower extremities 04/24/2016  . Venous insufficiency (chronic) (peripheral) 06/06/2016    Objective: No conversational dyspnea Age appropriate judgment and insight Nml affect and mood  Assessment and  Plan: Seasonal allergies  Moderate persistent asthma without complication  Dysuria - Plan: nitrofurantoin, macrocrystal-monohydrate, (MACROBID) 100 MG capsule  1. Start taking Xyzal in the AM daily. I think this will help #2. F/u in 2 weeks.  2. Cont Advair 250-50 mcg 2 puff bid. Hold on Singulair as she has not done well in past. Could do 5 mg/d in future.  3. Macrobid 100 mg bid for 5 d. Make sure bowels are regular.  Total time: 21 min The patient voiced understanding and agreement to the plan.  [place], DO 03/23/20  2:41 PM

## 2020-03-23 NOTE — Addendum Note (Signed)
Addended by: Ames Coupe on: 03/23/2020 02:44 PM   Modules accepted: Level of Service

## 2020-04-05 ENCOUNTER — Telehealth: Payer: Self-pay | Admitting: Family Medicine

## 2020-04-05 NOTE — Telephone Encounter (Signed)
PCP completed Called the patient informed completed//will pickup at the front desk.

## 2020-04-05 NOTE — Telephone Encounter (Signed)
Pt came in and dropped of form to be filled out   Patient would like to be called for it to be picked up ([phone])  Put papers in [place] bin up front

## 2020-04-19 DIAGNOSIS — K219 Gastro-esophageal reflux disease without esophagitis: Secondary | ICD-10-CM | POA: Insufficient documentation

## 2020-04-20 ENCOUNTER — Telehealth: Payer: Self-pay

## 2020-04-20 ENCOUNTER — Telehealth (INDEPENDENT_AMBULATORY_CARE_PROVIDER_SITE_OTHER): Payer: PPO | Admitting: Cardiology

## 2020-04-20 ENCOUNTER — Encounter: Payer: Self-pay | Admitting: Cardiology

## 2020-04-20 VITALS — BP 111/63 | HR 62 | Ht 66.0 in | Wt 143.0 lb

## 2020-04-20 DIAGNOSIS — E079 Disorder of thyroid, unspecified: Secondary | ICD-10-CM

## 2020-04-20 DIAGNOSIS — E782 Mixed hyperlipidemia: Secondary | ICD-10-CM | POA: Diagnosis not present

## 2020-04-20 DIAGNOSIS — I2510 Atherosclerotic heart disease of native coronary artery without angina pectoris: Secondary | ICD-10-CM

## 2020-04-20 DIAGNOSIS — I10 Essential (primary) hypertension: Secondary | ICD-10-CM | POA: Diagnosis not present

## 2020-04-20 DIAGNOSIS — E039 Hypothyroidism, unspecified: Secondary | ICD-10-CM | POA: Diagnosis not present

## 2020-04-20 NOTE — Patient Instructions (Signed)
Medication Instructions:  No medication changes. *If you need a refill on your cardiac medications before your next appointment, please call your pharmacy*   Lab Work: Your physician recommends that you return for lab work in: nest few days. You need to have labs done when you are fasting.  You can come Monday through Friday 8:30 am to 12:00 pm and 1:15 to 4:30. You do not need to make an appointment as the order has already been placed. The labs you are going to have done are BMET, CBC, TSH, LFT and Lipids.  If you have labs (blood work) drawn today and your tests are completely normal, you will receive your results only by: Marland Kitchen MyChart Message (if you have MyChart) OR . A paper copy in the mail If you have any lab test that is abnormal or we need to change your treatment, we will call you to review the results.   Testing/Procedures: None ordered   Follow-Up: At [facility], you and your health needs are our priority.  As part of our continuing mission to provide you with exceptional heart care, we have created designated Provider Care Teams.  These Care Teams include your primary Cardiologist (physician) and Advanced Practice Providers (APPs -  Physician Assistants and Nurse Practitioners) who all work together to provide you with the care you need, when you need it.  We recommend signing up for the patient portal called "MyChart".  Sign up information is provided on this After Visit Summary.  MyChart is used to connect with patients for Virtual Visits (Telemedicine).  Patients are able to view lab/test results, encounter notes, upcoming appointments, etc.  Non-urgent messages can be sent to your provider as well.   To learn more about what you can do with MyChart, go to [email or web address].    Your next appointment:   6 month(s)  The format for your next appointment:   In Person  Provider:   Jyl Heinz, MD   Other Instructions NA

## 2020-04-20 NOTE — Addendum Note (Signed)
Addended by: Truddie Hidden on: 04/20/2020 02:06 PM   Modules accepted: Orders

## 2020-04-20 NOTE — Progress Notes (Signed)
Virtual Visit via Video Note   This visit type was conducted due to national recommendations for restrictions regarding the COVID-19 Pandemic (e.g. social distancing) in an effort to limit this patient's exposure and mitigate transmission in our community.  Due to her co-morbid illnesses, this patient is at least at moderate risk for complications without adequate follow up.  This format is felt to be most appropriate for this patient at this time.  All issues noted in this document were discussed and addressed.  A limited physical exam was performed with this format.  Please refer to the patient's chart for her consent to telehealth for [facility].       Date:  04/20/2020   ID:  Jenna Campbell, DOB December 12, 1950, MRN [id number] The patient was identified using 2 identifiers.  Patient Location: Home Provider Location: Office/Clinic  PCP:  Shelda Pal, DO  Cardiologist:  No primary care provider on file.  Electrophysiologist:  None   Evaluation Performed:  Follow-Up Visit  Chief Complaint: Coronary artery disease follow-up  History of Present Illness:    Jenna Campbell is a 69 y.o. female with.  Patient has past medical history of coronary artery disease, essential hypertension and dyslipidemia.  She denies any problems at this time and takes care of activities of daily living.  No chest pain orthopnea or PND.  At the time of my evaluation, the patient is alert awake oriented and in no distress.  The patient does not have symptoms concerning for COVID-19 infection (fever, chills, cough, or new shortness of breath).    Past Medical History:  Diagnosis Date  . Acute bronchitis due to other specified organisms 04/21/2015  . Acute tension-type headache 12/16/2015  . Adjustment disorder with anxious mood 04/21/2015  . Allergic asthma with acute exacerbation 09/22/2014  . Allergic rhinitis due to pollen 07/23/2014  . Allergic urticaria 05/13/2015  . Allergy   . Alopecia  10/30/2013  . Angina pectoris ([place]) 07/29/2019  . Arthritis 04/24/2016  . Asthma   . Atrophic vaginitis 10/30/2013  . Basal cell carcinoma 04/21/2015  . Bilateral impacted cerumen 04/21/2015  . CAD (coronary artery disease) 10/28/2019  . Cancer ([place])    skin cancer  . Cervical radiculopathy 09/17/2015  . Change in bowel function 06/14/2016  . Chest pain in adult 05/28/2017  . Chest tightness 07/29/2019  . Chronic diarrhea 12/16/2015  . Colon polyps   . Cough 02/14/2016  . Cystocele, midline 10/30/2013  . Dermatitis 09/22/2014  . 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 Diverticulosis of large intestine 10/30/2013  . Dupuytren's contracture of both hands 09/17/2019  . Dyspareunia 07/23/2014  . Dysuria 12/16/2015  . Encounter for long-term (current) use of other medications 10/19/2012  . Esophageal dysphagia 06/14/2016  . Essential hypertension   . Family history of pheochromocytoma 02/14/2016  . Foot pain, bilateral 01/05/2018  . Gastroesophageal reflux disease   . Gastroesophageal reflux disease without esophagitis 05/13/2015  . Generalized abdominal pain 12/16/2015  . GERD (gastroesophageal reflux disease)   . H/O: hysterectomy   . Herpes simplex 08/17/2016  . History of adenomatous polyp of colon 06/14/2016  . History of blood transfusion 1970  . Hx of migraines 05/15/2009  . Hypothyroidism 10/30/2013  . Hypotonic bladder 10/30/2013  . Insomnia 10/11/2017  . Localized edema 12/16/2015  . Low back pain 09/17/2015  . Lumbar paraspinal muscle spasm 04/21/2015  . Major depression, recurrent ([place]) 07/14/2013  . Major depressive disorder, recurrent, mild ([place]) 03/14/2017  . Malaise and fatigue 04/21/2015  .  MCI (mild cognitive impairment) 08/18/2015  . Memory difficulty 09/17/2015  . Menopause 03/31/2014  . Midline low back pain without sciatica 03/31/2014  . Migraine   . Mild intermittent extrinsic asthma 12/16/2015  . Mild persistent asthma 05/13/2015  . Mixed dyslipidemia 11/12/2019  . Neck pain 09/17/2015  . Neuropathy  09/22/2014  . Nonintractable headache 11/02/2016  . Other allergic rhinitis 05/13/2015  . Palpitation 09/03/2017  . Pedal edema 07/29/2019  . Pelvic pain in female 02/17/2015  . Personality disorder ([place]) 10/19/2012  . Pharyngoesophageal dysphagia 04/21/2015  . Plantar fasciitis 01/20/2016  . Precordial chest pain 08/18/2015  . Prolapse of vaginal vault after hysterectomy 10/30/2013  . Rectocele 10/30/2013  . Seasonal allergies 10/15/2019  . Shin splint 04/21/2015  . Shortness of breath 07/17/2016  . Sinusitis, chronic 10/30/2013  . Slow transit constipation 07/23/2014  . Stable angina ([place]) 10/28/2019  . Swelling of lower limb 05/17/2016  . Tension-type headache, not intractable 09/17/2015  . Thyroid disease   . Tinnitus of both ears 10/11/2017  . Unstable angina ([place]) 07/29/2019  . Urethral stricture 10/30/2013  . Urinary incontinence   . Urinary incontinence without sensory awareness 07/17/2016  . Varicose veins of both lower extremities 04/24/2016  . Venous insufficiency (chronic) (peripheral) 06/06/2016   Past Surgical History:  Procedure Laterality Date  . BREAST CYST ASPIRATION Left    25 years ago   . INTRAVASCULAR PRESSURE WIRE/FFR STUDY N/A 10/17/2019   Procedure: INTRAVASCULAR PRESSURE WIRE/FFR STUDY;  Surgeon: Nelva Bush, MD;  Location: [place] [facility];  Service: Cardiovascular;  Laterality: N/A;  . LEFT HEART CATH AND CORONARY ANGIOGRAPHY N/A 10/17/2019   Procedure: LEFT HEART CATH AND CORONARY ANGIOGRAPHY;  Surgeon: Nelva Bush, MD;  Location: [place] [facility];  Service: Cardiovascular;  Laterality: N/A;  . MOHS SURGERY  10/05/2015  . RECTOCELE REPAIR  2011  . SKIN CANCER EXCISION  02/2015   Squamous cell removed from back  . TONSILLECTOMY    . VAGINAL HYSTERECTOMY    . VAGINAL PROLAPSE REPAIR       Current Meds  Medication Sig  . albuterol (PROVENTIL) (2.5 MG/3ML) 0.083% nebulizer solution Take 3 mLs (2.5 mg total) by nebulization every 6 (six) hours as needed for wheezing  or shortness of breath.  . ASPIRIN 81 PO Take 81 mg by mouth daily.   Marland Kitchen azelastine (ASTELIN) 0.1 % nasal spray Place 2 sprays into both nostrils 2 (two) times daily. Use in each nostril as directed  . betamethasone valerate (VALISONE) 0.1 % cream APPLY TO AFFECTED AREA TWICE A DAY  . Coenzyme Q10 (CO Q-10) 200 MG CAPS Take 200 mg by mouth daily.  Marland Kitchen gabapentin (NEURONTIN) 300 MG capsule Take 300-600 mg by mouth See admin instructions. 300 mg in the morning, 300-600 mg at bedtime  . isosorbide mononitrate (IMDUR) 30 MG 24 hr tablet Take 0.5 tablets (15 mg total) by mouth daily.  Marland Kitchen levothyroxine (SYNTHROID) 150 MCG tablet TAKE 1 & 1/2 TABLETS BY MOUTH DAILY BEFORE BREAKFAST  . LORazepam (ATIVAN) 0.5 MG tablet Take 0.5 mg by mouth every 8 (eight) hours as needed for anxiety.   . nitroGLYCERIN (NITROSTAT) 0.4 MG SL tablet PLACE 1 TABLET (0.4 MG TOTAL) UNDER THE TONGUE EVERY 5 (FIVE) MINUTES AS NEEDED FOR CHEST PAIN.  Marland Kitchen olopatadine (PATANOL) 0.1 % ophthalmic solution Place 1 drop into both eyes 2 (two) times daily.  . ondansetron (ZOFRAN-ODT) 4 MG disintegrating tablet Take 1 tablet (4 mg total) by mouth every 8 (eight) hours  as needed for nausea or vomiting.  . pantoprazole (PROTONIX) 40 MG tablet Take 1 tablet (40 mg total) by mouth daily.  . temazepam (RESTORIL) 15 MG capsule Take 15 mg by mouth at bedtime.   . topiramate (TOPAMAX) 100 MG tablet Take 100 mg by mouth at bedtime.   . valACYclovir (VALTREX) 500 MG tablet Take 2 tabs daily for 5 days when you have an outbreak.     Allergies:   Iodinated diagnostic agents, Kenalog [triamcinolone acetonide], Metrizamide, Other, Penicillins, Montelukast sodium, Xyzal [levocetirizine], Ciprofloxacin, Diazepam, Latex, and Sulfa antibiotics   Social History   Tobacco Use  . Smoking status: Never Smoker  . Smokeless tobacco: Never Used  Vaping Use  . Vaping Use: Never used  Substance Use Topics  . Alcohol use: Yes  . Drug use: No     Family  Hx: The patient's family history includes Alzheimer's disease in her mother; Angina in her father; Asthma in her father; Cancer in her sister; Emphysema in her father.  ROS:   Please see the history of present illness.    I discussed my findings with the patient at length All other systems reviewed and are negative.   Prior CV studies:   The following studies were reviewed today:  Conclusion  Conclusions: 1. Single vessel coronary artery disease with 50-60% ostial/proximal LAD stenosis extending back into the distal LMCA.  The lesion is not hemodynamically significant (DFR = 0.94; FFR = 0.88). 2. No significant disease involving LCx or RCA. 3. Normal left ventricular contraction and filling pressure.  Recommendations: 1. Medical therapy, including addition of isosorbide mononitrate and escalation of statin therapy as tolerated.  Nelva Bush, MD [facility]   Labs/Other Tests and Data Reviewed:    EKG:  EKG from previous visit was reviewed  Recent Labs: 07/27/2019: B Natriuretic Peptide 45.5 10/15/2019: TSH 1.76 10/28/2019: BUN 12; Creatinine, Ser 0.73; Hemoglobin 12.6; Platelets 270; Potassium 4.3; Sodium 134 01/09/2020: ALT 13   Recent Lipid Panel Lab Results  Component Value Date/Time   CHOL 211 (H) 10/09/2019 09:28 AM   TRIG 256 (H) 10/09/2019 09:28 AM   HDL 37 (L) 10/09/2019 09:28 AM   CHOLHDL 5.7 (H) 10/09/2019 09:28 AM   LDLCALC 128 (H) 10/09/2019 09:28 AM    Wt Readings from Last 3 Encounters:  04/20/20 143 lb (64.9 kg)  11/24/19 150 lb 2 oz (68.1 kg)  11/12/19 151 lb (68.5 kg)     Risk Assessment/Calculations:      Objective:    Vital Signs:  BP 111/63   Pulse 62   Ht 5\' 6"  (1.676 m)   Wt 143 lb (64.9 kg)   BMI 23.08 kg/m    VITAL SIGNS:  reviewed  ASSESSMENT & PLAN:    1. Coronary artery disease: Secondary prevention stressed with the patient.  Importance of compliance with diet medication stressed and she vocalized understanding.  She  is walking on a regular basis and happy about it.  She was advised to walk at least half an hour a day 5 days a week and she promises to do so. 2. Essential hypertension: Blood pressure stable and diet was emphasized.  Lifestyle modification also visited 3. Mixed dyslipidemia: On lipid-lowering therapy.  Diet emphasized.  She will be back in next few days for complete blood work.   Shared Decision Making/Informed Consent        COVID-19 Education: The signs and symptoms of COVID-19 were discussed with the patient and how to seek care for testing (  follow up with PCP or arrange E-visit).  The importance of social distancing was discussed today.  Time:   Today, I have spent 15 minutes with the patient with telehealth technology discussing the above problems.     Medication Adjustments/Labs and Tests Ordered: Current medicines are reviewed at length with the patient today.  Concerns regarding medicines are outlined above.   Tests Ordered: No orders of the defined types were placed in this encounter.   Medication Changes: No orders of the defined types were placed in this encounter.   Follow Up:  In Person in 6 month(s)  Signed, Jenean Lindau, MD  04/20/2020 1:44 PM    [facility]

## 2020-04-20 NOTE — Telephone Encounter (Signed)
  Patient Consent for Virtual Visit         Jenna Campbell has provided verbal consent on 04/20/2020 for a virtual visit (video or telephone).   CONSENT FOR VIRTUAL VISIT FOR:  Jenna Campbell  By participating in this virtual visit I agree to the following:  I hereby voluntarily request, consent and authorize [facility] and its employed or contracted physicians, physician assistants, nurse practitioners or other licensed health care professionals (the Practitioner), to provide me with telemedicine health care services (the "Services") as deemed necessary by the treating Practitioner. I acknowledge and consent to receive the Services by the Practitioner via telemedicine. I understand that the telemedicine visit will involve communicating with the Practitioner through live audiovisual communication technology and the disclosure of certain medical information by electronic transmission. I acknowledge that I have been given the opportunity to request an in-person assessment or other available alternative prior to the telemedicine visit and am voluntarily participating in the telemedicine visit.  I understand that I have the right to withhold or withdraw my consent to the use of telemedicine in the course of my care at any time, without affecting my right to future care or treatment, and that the Practitioner or I may terminate the telemedicine visit at any time. I understand that I have the right to inspect all information obtained and/or recorded in the course of the telemedicine visit and may receive copies of available information for a reasonable fee.  I understand that some of the potential risks of receiving the Services via telemedicine include:  Marland Kitchen Delay or interruption in medical evaluation due to technological equipment failure or disruption; . Information transmitted may not be sufficient (e.g. poor resolution of images) to allow for appropriate medical decision making by the Practitioner;  and/or  . In rare instances, security protocols could fail, causing a breach of personal health information.  Furthermore, I acknowledge that it is my responsibility to provide information about my medical history, conditions and care that is complete and accurate to the best of my ability. I acknowledge that Practitioner's advice, recommendations, and/or decision may be based on factors not within their control, such as incomplete or inaccurate data provided by me or distortions of diagnostic images or specimens that may result from electronic transmissions. I understand that the practice of medicine is not an exact science and that Practitioner makes no warranties or guarantees regarding treatment outcomes. I acknowledge that a copy of this consent can be made available to me via my patient portal ([place]), or I can request a printed copy by calling the office of [facility].    I understand that my insurance will be billed for this visit.   I have read or had this consent read to me. . I understand the contents of this consent, which adequately explains the benefits and risks of the Services being provided via telemedicine.  . I have been provided ample opportunity to ask questions regarding this consent and the Services and have had my questions answered to my satisfaction. . I give my informed consent for the services to be provided through the use of telemedicine in my medical care

## 2020-04-23 DIAGNOSIS — Z20822 Contact with and (suspected) exposure to covid-19: Secondary | ICD-10-CM | POA: Diagnosis not present

## 2020-04-26 ENCOUNTER — Encounter: Payer: Self-pay | Admitting: Family Medicine

## 2020-04-26 ENCOUNTER — Telehealth (INDEPENDENT_AMBULATORY_CARE_PROVIDER_SITE_OTHER): Payer: PPO | Admitting: Family Medicine

## 2020-04-26 ENCOUNTER — Telehealth: Payer: Self-pay

## 2020-04-26 ENCOUNTER — Other Ambulatory Visit: Payer: Self-pay

## 2020-04-26 VITALS — BP 161/77 | HR 60 | Temp 98.7°F | Ht 66.0 in | Wt 141.0 lb

## 2020-04-26 DIAGNOSIS — J309 Allergic rhinitis, unspecified: Secondary | ICD-10-CM

## 2020-04-26 MED ORDER — PREDNISONE 20 MG PO TABS: 40.0000 mg | ORAL_TABLET | Freq: Every day | ORAL | 0 refills | Status: AC

## 2020-04-26 MED ORDER — PROMETHAZINE-DM 6.25-15 MG/5ML PO SYRP: 5.0000 mL | ORAL_SOLUTION | Freq: Four times a day (QID) | ORAL | 0 refills | Status: DC | PRN

## 2020-04-26 NOTE — Telephone Encounter (Signed)
Scheduled Video visit today at 2:30

## 2020-04-26 NOTE — Telephone Encounter (Signed)
Caller states she did not have any missed calls from the nurse and she needs the medication called in before the pharmacy closes.  Disp. Time Eilene Ghazi Time) Disposition Final User 04/25/2020 5:11:38 PM Attempt made - message left [place], RN, Fabio Bering 04/25/2020 5:27:21 PM Attempt made - message left Luvenia Heller, RN, Fabio Bering 04/25/2020 5:27:41 PM Attempt made - message left Luvenia Heller, RN, Fabio Bering 04/25/2020 5:38:25 PM FINAL ATTEMPT MADE - no message left Yes Luvenia Heller, RN, Fabio Bering

## 2020-04-26 NOTE — Progress Notes (Signed)
Chief Complaint  Patient presents with  . Sore Throat    Jenna Campbell here for URI complaints. Due to COVID-19 pandemic, we are interacting via web portal for an electronic face-to-face visit. I verified patient's ID using 2 identifiers. Patient agreed to proceed with visit via this method. Patient is at home, I am at office. Patient and I are present for visit.   Duration: 9 days  Associated symptoms: sinus congestion, rhinorrhea, itchy watery eyes, ear fullness, ear pain, sore throat and cough Denies: sinus pain, ear drainage, wheezing, shortness of breath, myalgia and fevers, N/V/D Treatment to date: Tylenol, mucous relief medication Sick contacts: No  Covid test pending from 3 d ago.  Home test x 2 for covid neg.    Past Medical History:  Diagnosis Date  . Acute bronchitis due to other specified organisms 04/21/2015  . Acute tension-type headache 12/16/2015  . Adjustment disorder with anxious mood 04/21/2015  . Allergic asthma with acute exacerbation 09/22/2014  . Allergic rhinitis due to pollen 07/23/2014  . Allergic urticaria 05/13/2015  . Allergy   . Alopecia 10/30/2013  . Angina pectoris ([place]) 07/29/2019  . Arthritis 04/24/2016  . Asthma   . Atrophic vaginitis 10/30/2013  . Basal cell carcinoma 04/21/2015  . Bilateral impacted cerumen 04/21/2015  . CAD (coronary artery disease) 10/28/2019  . Cancer ([place])    skin cancer  . Cervical radiculopathy 09/17/2015  . Change in bowel function 06/14/2016  . Chest pain in adult 05/28/2017  . Chest tightness 07/29/2019  . Chronic diarrhea 12/16/2015  . Colon polyps   . Cough 02/14/2016  . Cystocele, midline 10/30/2013  . Dermatitis 09/22/2014  . 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 Diverticulosis of large intestine 10/30/2013  . Dupuytren's contracture of both hands 09/17/2019  . Dyspareunia 07/23/2014  . Dysuria 12/16/2015  . Encounter for long-term (current) use of other medications 10/19/2012  . Esophageal dysphagia 06/14/2016  . Essential  hypertension   . Family history of pheochromocytoma 02/14/2016  . Foot pain, bilateral 01/05/2018  . Gastroesophageal reflux disease   . Gastroesophageal reflux disease without esophagitis 05/13/2015  . Generalized abdominal pain 12/16/2015  . GERD (gastroesophageal reflux disease)   . H/O: hysterectomy   . Herpes simplex 08/17/2016  . History of adenomatous polyp of colon 06/14/2016  . History of blood transfusion 1970  . Hx of migraines 05/15/2009  . Hypothyroidism 10/30/2013  . Hypotonic bladder 10/30/2013  . Insomnia 10/11/2017  . Localized edema 12/16/2015  . Low back pain 09/17/2015  . Lumbar paraspinal muscle spasm 04/21/2015  . Major depression, recurrent ([place]) 07/14/2013  . Major depressive disorder, recurrent, mild ([place]) 03/14/2017  . Malaise and fatigue 04/21/2015  . MCI (mild cognitive impairment) 08/18/2015  . Memory difficulty 09/17/2015  . Menopause 03/31/2014  . Midline low back pain without sciatica 03/31/2014  . Migraine   . Mild intermittent extrinsic asthma 12/16/2015  . Mild persistent asthma 05/13/2015  . Mixed dyslipidemia 11/12/2019  . Neck pain 09/17/2015  . Neuropathy 09/22/2014  . Nonintractable headache 11/02/2016  . Other allergic rhinitis 05/13/2015  . Palpitation 09/03/2017  . Pedal edema 07/29/2019  . Pelvic pain in female 02/17/2015  . Personality disorder ([place]) 10/19/2012  . Pharyngoesophageal dysphagia 04/21/2015  . Plantar fasciitis 01/20/2016  . Precordial chest pain 08/18/2015  . Prolapse of vaginal vault after hysterectomy 10/30/2013  . Rectocele 10/30/2013  . Seasonal allergies 10/15/2019  . Shin splint 04/21/2015  . Shortness of breath 07/17/2016  . Sinusitis, chronic 10/30/2013  . Slow transit  constipation 07/23/2014  . Stable angina ([place]) 10/28/2019  . Swelling of lower limb 05/17/2016  . Tension-type headache, not intractable 09/17/2015  . Thyroid disease   . Tinnitus of both ears 10/11/2017  . Unstable angina ([address]) 07/29/2019  . Urethral stricture 10/30/2013  . Urinary  incontinence   . Urinary incontinence without sensory awareness 07/17/2016  . Varicose veins of both lower extremities 04/24/2016  . Venous insufficiency (chronic) (peripheral) 06/06/2016   Exam No conversational dyspnea Age appropriate judgment and insight Nml affect and mood  Allergic rhinitis, unspecified seasonality, unspecified trigger - Plan: predniSONE (DELTASONE) 20 MG tablet  5 d pred burst, 40 mg/d.  Continue to push fluids, practice good hand hygiene, cover mouth when coughing. F/u prn. If starting to experience fevers, shaking, or shortness of breath, seek immediate care. Pt voiced understanding and agreement to the plan.  [place], DO 04/26/20 2:53 PM

## 2020-04-26 NOTE — Telephone Encounter (Signed)
Caller took 2 home tests fro covid and they were neg. Took tests with office, but it is taking too long to get results. Caller wants med for sinus. She has congestion, sore throat, cough, ears congested and Hurting.  Disp. Time Eilene Ghazi Time) Disposition Final User 04/25/2020 3:56:24 PM Attempt made - message left Hassan Buckler 04/25/2020 4:15:16 PM FINAL ATTEMPT MADE - message left Yes Laveda Abbe, RN, Marjory Lies

## 2020-04-30 ENCOUNTER — Other Ambulatory Visit: Payer: Self-pay

## 2020-04-30 ENCOUNTER — Telehealth (INDEPENDENT_AMBULATORY_CARE_PROVIDER_SITE_OTHER): Payer: PPO | Admitting: Medical

## 2020-04-30 ENCOUNTER — Ambulatory Visit (HOSPITAL_BASED_OUTPATIENT_CLINIC_OR_DEPARTMENT_OTHER)
Admission: RE | Admit: 2020-04-30 | Discharge: 2020-04-30 | Disposition: A | Discharge status: Home / Self Care | Payer: PPO | Source: Ambulatory Visit | Attending: Medical | Admitting: Medical

## 2020-04-30 ENCOUNTER — Other Ambulatory Visit: Payer: Self-pay | Admitting: Family Medicine

## 2020-04-30 VITALS — BP 141/67 | HR 74 | Temp 97.7°F | Resp 18 | Ht 66.0 in | Wt 144.8 lb

## 2020-04-30 DIAGNOSIS — J208 Acute bronchitis due to other specified organisms: Secondary | ICD-10-CM | POA: Diagnosis not present

## 2020-04-30 DIAGNOSIS — R059 Cough, unspecified: Secondary | ICD-10-CM | POA: Diagnosis not present

## 2020-04-30 DIAGNOSIS — R0789 Other chest pain: Secondary | ICD-10-CM | POA: Diagnosis not present

## 2020-04-30 DIAGNOSIS — R0602 Shortness of breath: Secondary | ICD-10-CM | POA: Diagnosis not present

## 2020-04-30 MED ORDER — AZITHROMYCIN 250 MG PO TABS
ORAL_TABLET | ORAL | 0 refills | Status: DC
Start: 2020-04-30 — End: 2020-06-15

## 2020-04-30 MED ORDER — FLUTICASONE PROPIONATE 50 MCG/ACT NA SUSP: 2.0000 | Freq: Every day | NASAL | 2 refills | Status: DC

## 2020-04-30 NOTE — Patient Instructions (Addendum)
You appear to have bronchitis. Rest hydrate and tylenol for fever. Use  cough medicine phenergan-dextromethoraphan cough syrup, azithromycin antibiotic and use flonase.  Get cxr today.  In light of christmas and holidays/potential family gathering recommend repeat covid tests as discussed   Follow up in 7-10 days or as needed

## 2020-04-30 NOTE — Progress Notes (Signed)
   Subjective:    Patient ID: Jenna Campbell, female    DOB: [birth year], 69 y.o.   MRN: [id number]  HPI  Virtual Visit via Video Note  I connected with [name] on 04/30/20 at  3:40 PM EST by a video enabled telemedicine application and verified that I am speaking with the correct person using two identifiers.  Location: Patient: office in her car. Provider:home   I discussed the limitations of evaluation and management by telemedicine and the availability of in person appointments. The patient expressed understanding and agreed to proceed.  History of Present Illness:   She has some ear pressure, has mild st, chest congested, nasal congestion and fatigue for 2 weeks.  Pt has been on prednisone 40 mg prednisone course for 5 days(just finished). Pt pcp just called her in flonase.   Pt took 2 at home covid test last week and was negative. Pt states just recently had pcr test that was negative on Sunday.    Observations/Objective:  General-no acute distress, pleasant, oriented. Lungs- on inspection lungs appear unlabored. Neck- no tracheal deviation or jvd on inspection. Neuro- gross motor function appears intact.   Assessment and Plan: You appear to have bronchitis. Rest hydrate and tylenol for fever. Use  cough medicine phenergan-dextromethoraphan cough syrup, azithromycin antibiotic and use flonase.  Get cxr today.  In light of christmas and holidays/potential family gathering recommend repeat covid tests as discussed   Follow up in 7-10 days or as needed  Mackie Pai, PA-C  Follow Up Instructions:    I discussed the assessment and treatment plan with the patient. The patient was provided an opportunity to ask questions and all were answered. The patient agreed with the plan and demonstrated an understanding of the instructions.   The patient was advised to call back or seek an in-person evaluation if the symptoms worsen or if the condition fails to improve as  anticipated.  Time spent with patient today was 20  minutes which consisted of chart revdiew, discussing diagnosis, work up treatment and documentation.   Mackie Pai, PA-C   Review of Systems  Constitutional: Positive for fatigue. Negative for chills and fever.  HENT: Positive for congestion.   Respiratory: Positive for cough. Negative for shortness of breath and wheezing.   Cardiovascular: Negative for chest pain and palpitations.  Gastrointestinal: Negative for abdominal pain.  Musculoskeletal: Negative for back pain.  Skin: Negative for rash.  Neurological: Negative for dizziness and headaches.  Hematological: Negative for adenopathy. Does not bruise/bleed easily.  Psychiatric/Behavioral: Negative for behavioral problems and confusion.       Objective:   Physical Exam        Assessment & Plan:

## 2020-05-07 ENCOUNTER — Encounter: Payer: Self-pay | Admitting: Medical

## 2020-05-10 ENCOUNTER — Telehealth: Payer: Self-pay | Admitting: Medical

## 2020-05-10 ENCOUNTER — Telehealth: Payer: Self-pay

## 2020-05-10 DIAGNOSIS — R059 Cough, unspecified: Secondary | ICD-10-CM

## 2020-05-10 MED ORDER — DOXYCYCLINE HYCLATE 100 MG PO TABS
100.0000 mg | ORAL_TABLET | Freq: Two times a day (BID) | ORAL | 0 refills | Status: DC
Start: 2020-05-10 — End: 2020-05-11

## 2020-05-10 NOTE — Telephone Encounter (Signed)
Can give caps or generic.

## 2020-05-10 NOTE — Telephone Encounter (Signed)
Caller states her Dr. Harvie Heck did a telehealth with her last week, she was given antibiotics and finished. Caller states her symptoms are better, caller states she still has sore throat and ear pain.  Additional Comment Caller insist on speaking to on call says she has asthma and lowered immunity, and on nitro for her heart. Caller states her covid test were negative but this is settling in her chest. Caller requesting stronger or longer antibiotics.  Disp. Time Disposition Final User 05/07/2020 4:54:26 PM Send to [place], [place] 05/07/2020 5:12:29 PM Called On-Call Provider Swanner, Stefanie 05/07/2020 5:14:16 PM Page Completed Yes Swanner, Stefanie Comments User: Corey Harold Date/Time Eilene Ghazi Time): 05/07/2020 5:02:03 PM Dr. Regis Bill refused to accept the page. Said a nurse should handle this. User: Corey Harold Date/Time (Eastern Time): 05/07/2020 5:14:43 PM Could not reach patient, connected Dr. Regis Bill with the patient's voicemail. Paging DoctorName Phone DateTime Result/Outcome Message Type Notes Shanon Ace - MD [phone] 05/07/2020 5:12:29 PM Called On Call Provider - Reached Doctor Paged Shanon Ace - MD 05/07/2020 5:12:34 PM Spoke with On Call - General Message Result Call Closed By: Corey Harold Transaction Date/Time: 05/07/2020 4:48:26 PM (ET

## 2020-05-10 NOTE — Telephone Encounter (Signed)
Chest xray order placed. 

## 2020-05-11 ENCOUNTER — Other Ambulatory Visit: Payer: Self-pay

## 2020-05-11 ENCOUNTER — Encounter: Payer: Self-pay | Admitting: Medical

## 2020-05-11 ENCOUNTER — Telehealth: Payer: Self-pay | Admitting: Medical

## 2020-05-11 ENCOUNTER — Telehealth: Payer: Self-pay | Admitting: Family Medicine

## 2020-05-11 ENCOUNTER — Ambulatory Visit (HOSPITAL_BASED_OUTPATIENT_CLINIC_OR_DEPARTMENT_OTHER)
Admission: RE | Admit: 2020-05-11 | Discharge: 2020-05-11 | Disposition: A | Discharge status: Home / Self Care | Payer: PPO | Source: Ambulatory Visit | Attending: Medical | Admitting: Medical

## 2020-05-11 DIAGNOSIS — Z79899 Other long term (current) drug therapy: Secondary | ICD-10-CM | POA: Diagnosis not present

## 2020-05-11 DIAGNOSIS — F330 Major depressive disorder, recurrent, mild: Secondary | ICD-10-CM | POA: Diagnosis not present

## 2020-05-11 DIAGNOSIS — F4322 Adjustment disorder with anxiety: Secondary | ICD-10-CM | POA: Diagnosis not present

## 2020-05-11 DIAGNOSIS — J029 Acute pharyngitis, unspecified: Secondary | ICD-10-CM | POA: Diagnosis not present

## 2020-05-11 DIAGNOSIS — R059 Cough, unspecified: Secondary | ICD-10-CM | POA: Diagnosis not present

## 2020-05-11 DIAGNOSIS — R0789 Other chest pain: Secondary | ICD-10-CM | POA: Diagnosis not present

## 2020-05-11 DIAGNOSIS — F449 Dissociative and conversion disorder, unspecified: Secondary | ICD-10-CM | POA: Diagnosis not present

## 2020-05-11 DIAGNOSIS — F5101 Primary insomnia: Secondary | ICD-10-CM | POA: Diagnosis not present

## 2020-05-11 MED ORDER — DOXYCYCLINE HYCLATE 100 MG PO TABS: 100.0000 mg | ORAL_TABLET | Freq: Two times a day (BID) | ORAL | 0 refills | Status: DC

## 2020-05-11 NOTE — Telephone Encounter (Signed)
Rx sent 

## 2020-05-11 NOTE — Telephone Encounter (Signed)
Opened in Error.

## 2020-05-11 NOTE — Telephone Encounter (Signed)
Patient states she would like to pick medication doxycycline from [facility] [address] .    [id number] [place], [place] - [address] Phone:  [phone]  Fax:  [phone]

## 2020-05-12 ENCOUNTER — Other Ambulatory Visit: Payer: Self-pay | Admitting: Family Medicine

## 2020-05-12 ENCOUNTER — Encounter: Payer: Self-pay | Admitting: Medical

## 2020-05-12 DIAGNOSIS — B009 Herpesviral infection, unspecified: Secondary | ICD-10-CM

## 2020-05-14 DIAGNOSIS — F332 Major depressive disorder, recurrent severe without psychotic features: Secondary | ICD-10-CM | POA: Diagnosis not present

## 2020-05-20 DIAGNOSIS — I2510 Atherosclerotic heart disease of native coronary artery without angina pectoris: Secondary | ICD-10-CM | POA: Diagnosis not present

## 2020-05-20 DIAGNOSIS — I10 Essential (primary) hypertension: Secondary | ICD-10-CM | POA: Diagnosis not present

## 2020-05-20 DIAGNOSIS — E039 Hypothyroidism, unspecified: Secondary | ICD-10-CM | POA: Diagnosis not present

## 2020-05-20 DIAGNOSIS — E079 Disorder of thyroid, unspecified: Secondary | ICD-10-CM | POA: Diagnosis not present

## 2020-05-20 DIAGNOSIS — E782 Mixed hyperlipidemia: Secondary | ICD-10-CM | POA: Diagnosis not present

## 2020-05-21 ENCOUNTER — Telehealth: Payer: Self-pay

## 2020-05-21 DIAGNOSIS — E782 Mixed hyperlipidemia: Secondary | ICD-10-CM

## 2020-05-21 DIAGNOSIS — I10 Essential (primary) hypertension: Secondary | ICD-10-CM

## 2020-05-21 LAB — CBC WITH DIFFERENTIAL/PLATELET
Basophils Absolute: 0 10*3/uL (ref 0.0–0.2)
Basos: 0 %
EOS (ABSOLUTE): 0.1 10*3/uL (ref 0.0–0.4)
Eos: 3 %
Hematocrit: 40.9 % (ref 34.0–46.6)
Hemoglobin: 13.5 g/dL (ref 11.1–15.9)
Immature Grans (Abs): 0 10*3/uL (ref 0.0–0.1)
Immature Granulocytes: 0 %
Lymphocytes Absolute: 2 10*3/uL (ref 0.7–3.1)
Lymphs: 40 %
MCH: 29.7 pg (ref 26.6–33.0)
MCHC: 33 g/dL (ref 31.5–35.7)
MCV: 90 fL (ref 79–97)
Monocytes Absolute: 0.5 10*3/uL (ref 0.1–0.9)
Monocytes: 11 %
Neutrophils Absolute: 2.3 10*3/uL (ref 1.4–7.0)
Neutrophils: 46 %
Platelets: 256 10*3/uL (ref 150–450)
RBC: 4.54 x10E6/uL (ref 3.77–5.28)
RDW: 13.2 % (ref 11.7–15.4)
WBC: 5 10*3/uL (ref 3.4–10.8)

## 2020-05-21 LAB — HEPATIC FUNCTION PANEL
ALT: 13 IU/L (ref 0–32)
AST: 18 IU/L (ref 0–40)
Albumin: 4 g/dL (ref 3.8–4.8)
Alkaline Phosphatase: 80 IU/L (ref 44–121)
Bilirubin Total: 0.4 mg/dL (ref 0.0–1.2)
Bilirubin, Direct: 0.11 mg/dL (ref 0.00–0.40)
Total Protein: 6.3 g/dL (ref 6.0–8.5)

## 2020-05-21 LAB — BASIC METABOLIC PANEL
BUN/Creatinine Ratio: 12 (ref 12–28)
BUN: 9 mg/dL (ref 8–27)
CO2: 22 mmol/L (ref 20–29)
Calcium: 9.5 mg/dL (ref 8.7–10.3)
Chloride: 102 mmol/L (ref 96–106)
Creatinine, Ser: 0.74 mg/dL (ref 0.57–1.00)
GFR calc Af Amer: 95 mL/min/{1.73_m2} (ref >59)
GFR calc non Af Amer: 82 mL/min/{1.73_m2} (ref >59)
Glucose: 87 mg/dL (ref 65–99)
Potassium: 4.2 mmol/L (ref 3.5–5.2)
Sodium: 137 mmol/L (ref 134–144)

## 2020-05-21 LAB — LIPID PANEL
Chol/HDL Ratio: 4.1 ratio (ref 0.0–4.4)
Cholesterol, Total: 178 mg/dL (ref 100–199)
HDL: 43 mg/dL (ref >39)
LDL Chol Calc (NIH): 111 mg/dL — ABNORMAL HIGH (ref 0–99)
Triglycerides: 134 mg/dL (ref 0–149)
VLDL Cholesterol Cal: 24 mg/dL (ref 5–40)

## 2020-05-21 LAB — TSH: TSH: 1.47 u[IU]/mL (ref 0.450–4.500)

## 2020-05-21 MED ORDER — ATORVASTATIN CALCIUM 20 MG PO TABS
20.0000 mg | ORAL_TABLET | Freq: Every day | ORAL | 6 refills | Status: DC
Start: 2020-05-21 — End: 2020-10-13

## 2020-05-21 NOTE — Telephone Encounter (Signed)
-----   Message from Jenean Lindau, MD sent at 05/21/2020  1:32 PM EST ----- Diet.  Double statin.  Liver lipid check in 6 weeks.  Copy primary care lipids are elevated. Jenean Lindau, MD 05/21/2020 1:32 PM

## 2020-05-21 NOTE — Telephone Encounter (Signed)
Pt is aware and agreeable to lab results and recommendations Pt is agreeable to increasing Atorvastatin to 20 mg daily Pt is agreeable to repeat LFT and Lipid in 6 weeks

## 2020-06-03 DIAGNOSIS — F332 Major depressive disorder, recurrent severe without psychotic features: Secondary | ICD-10-CM | POA: Diagnosis not present

## 2020-06-07 DIAGNOSIS — R413 Other amnesia: Secondary | ICD-10-CM | POA: Diagnosis not present

## 2020-06-07 DIAGNOSIS — R519 Headache, unspecified: Secondary | ICD-10-CM | POA: Diagnosis not present

## 2020-06-07 DIAGNOSIS — M545 Low back pain, unspecified: Secondary | ICD-10-CM | POA: Diagnosis not present

## 2020-06-07 DIAGNOSIS — M25512 Pain in left shoulder: Secondary | ICD-10-CM | POA: Diagnosis not present

## 2020-06-14 ENCOUNTER — Telehealth: Payer: Self-pay | Admitting: Cardiology

## 2020-06-14 NOTE — Telephone Encounter (Signed)
Patient states since Saturday night she started having terrible cramping in her upper stomach and lower pelvic area. She states she has also had light bleeding spotting and the bleeding got worse throughout the night. She states she still has some and also has had a lot of diarrhea last night. She states she has not much of it since, but she has not eaten anything solid. She states when she did got to the bathroom there was blood around it. She states she is not sure if she needs to take her 24 hr nitroglycerin. She also states she has been off lipitor for a week and has an appointment with her PCP tomorrow and is not sure if she needs to see Dr. Geraldo Pitter.

## 2020-06-14 NOTE — Telephone Encounter (Signed)
Pt advised as if she is not having chest pain to take NTG. Pt is seeing her PCP tomorrow. Pt had no additional questions.

## 2020-06-15 ENCOUNTER — Encounter: Payer: Self-pay | Admitting: Family Medicine

## 2020-06-15 ENCOUNTER — Other Ambulatory Visit: Payer: Self-pay

## 2020-06-15 ENCOUNTER — Ambulatory Visit (INDEPENDENT_AMBULATORY_CARE_PROVIDER_SITE_OTHER): Payer: PPO | Admitting: Family Medicine

## 2020-06-15 ENCOUNTER — Encounter: Payer: Self-pay | Admitting: Gastroenterology

## 2020-06-15 VITALS — BP 140/84 | HR 77 | Temp 97.8°F | Ht 66.5 in | Wt 141.2 lb

## 2020-06-15 DIAGNOSIS — K625 Hemorrhage of anus and rectum: Secondary | ICD-10-CM | POA: Diagnosis not present

## 2020-06-15 DIAGNOSIS — R319 Hematuria, unspecified: Secondary | ICD-10-CM

## 2020-06-15 DIAGNOSIS — D692 Other nonthrombocytopenic purpura: Secondary | ICD-10-CM

## 2020-06-15 DIAGNOSIS — R1084 Generalized abdominal pain: Secondary | ICD-10-CM

## 2020-06-15 HISTORY — DX: Other nonthrombocytopenic purpura: D69.2

## 2020-06-15 LAB — URINALYSIS, MICROSCOPIC ONLY: RBC / HPF: NONE SEEN (ref 0–?)

## 2020-06-15 LAB — CBC
HCT: 41 % (ref 36.0–46.0)
Hemoglobin: 13.9 g/dL (ref 12.0–15.0)
MCHC: 33.9 g/dL (ref 30.0–36.0)
MCV: 89.3 fl (ref 78.0–100.0)
Platelets: 254 10*3/uL (ref 150.0–400.0)
RBC: 4.59 Mil/uL (ref 3.87–5.11)
RDW: 13.3 % (ref 11.5–15.5)
WBC: 6.2 10*3/uL (ref 4.0–10.5)

## 2020-06-15 MED ORDER — NITROFURANTOIN MONOHYD MACRO 100 MG PO CAPS: 100.0000 mg | ORAL_CAPSULE | Freq: Two times a day (BID) | ORAL | 0 refills | Status: AC

## 2020-06-15 MED ORDER — ONDANSETRON 4 MG PO TBDP: 4.0000 mg | ORAL_TABLET | Freq: Three times a day (TID) | ORAL | 0 refills | Status: DC | PRN

## 2020-06-15 MED ORDER — DICYCLOMINE HCL 10 MG PO CAPS: ORAL_CAPSULE | ORAL | 0 refills | Status: DC

## 2020-06-15 NOTE — Progress Notes (Signed)
Chief Complaint  Patient presents with  . Abdominal Pain    Jenna Campbell is here for a sharp abdominal pain.  Duration: 4 days  Generalized Nighttime awakenings? No Bleeding? Yes; dripping in toilet Weight loss? No Palliation: Zofran helped the nausea Provocation: Eating Associated symptoms: nausea, decreased appetite/intake, blood in urine, dysuria Denies: vomiting and diarrhea; no other urinary complaints Treatment to date: Zofran, Tylenol, heat She ha CCS in 2017 and was told to f/u in 3 yrs, but she had gastritis afterwards and has not done so.   Past Medical History:  Diagnosis Date  . Acute bronchitis due to other specified organisms 04/21/2015  . Acute tension-type headache 12/16/2015  . Adjustment disorder with anxious mood 04/21/2015  . Allergic asthma with acute exacerbation 09/22/2014  . Allergic rhinitis due to pollen 07/23/2014  . Allergic urticaria 05/13/2015  . Allergy   . Alopecia 10/30/2013  . Angina pectoris ([place]) 07/29/2019  . Arthritis 04/24/2016  . Asthma   . Atrophic vaginitis 10/30/2013  . Basal cell carcinoma 04/21/2015  . Bilateral impacted cerumen 04/21/2015  . CAD (coronary artery disease) 10/28/2019  . Cancer ([place])    skin cancer  . Cervical radiculopathy 09/17/2015  . Change in bowel function 06/14/2016  . Chest pain in adult 05/28/2017  . Chest tightness 07/29/2019  . Chronic diarrhea 12/16/2015  . Colon polyps   . Cough 02/14/2016  . Cystocele, midline 10/30/2013  . Dermatitis 09/22/2014  . 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 Diverticulosis of large intestine 10/30/2013  . Dupuytren's contracture of both hands 09/17/2019  . Dyspareunia 07/23/2014  . Dysuria 12/16/2015  . Encounter for long-term (current) use of other medications 10/19/2012  . Esophageal dysphagia 06/14/2016  . Essential hypertension   . Family history of pheochromocytoma 02/14/2016  . Foot pain, bilateral 01/05/2018  . Gastroesophageal reflux disease   . Gastroesophageal reflux disease without  esophagitis 05/13/2015  . Generalized abdominal pain 12/16/2015  . GERD (gastroesophageal reflux disease)   . H/O: hysterectomy   . Herpes simplex 08/17/2016  . History of adenomatous polyp of colon 06/14/2016  . History of blood transfusion 1970  . Hx of migraines 05/15/2009  . Hypothyroidism 10/30/2013  . Hypotonic bladder 10/30/2013  . Insomnia 10/11/2017  . Localized edema 12/16/2015  . Low back pain 09/17/2015  . Lumbar paraspinal muscle spasm 04/21/2015  . Major depression, recurrent ([place]) 07/14/2013  . Major depressive disorder, recurrent, mild ([place]) 03/14/2017  . Malaise and fatigue 04/21/2015  . MCI (mild cognitive impairment) 08/18/2015  . Memory difficulty 09/17/2015  . Menopause 03/31/2014  . Midline low back pain without sciatica 03/31/2014  . Migraine   . Mild intermittent extrinsic asthma 12/16/2015  . Mild persistent asthma 05/13/2015  . Mixed dyslipidemia 11/12/2019  . Neck pain 09/17/2015  . Neuropathy 09/22/2014  . Nonintractable headache 11/02/2016  . Other allergic rhinitis 05/13/2015  . Palpitation 09/03/2017  . Pedal edema 07/29/2019  . Pelvic pain in female 02/17/2015  . Personality disorder ([place]) 10/19/2012  . Pharyngoesophageal dysphagia 04/21/2015  . Plantar fasciitis 01/20/2016  . Precordial chest pain 08/18/2015  . Prolapse of vaginal vault after hysterectomy 10/30/2013  . Rectocele 10/30/2013  . Seasonal allergies 10/15/2019  . Shin splint 04/21/2015  . Shortness of breath 07/17/2016  . Sinusitis, chronic 10/30/2013  . Slow transit constipation 07/23/2014  . Stable angina ([place]) 10/28/2019  . Swelling of lower limb 05/17/2016  . Tension-type headache, not intractable 09/17/2015  . Thyroid disease   . Tinnitus of both ears 10/11/2017  .  Unstable angina ([place]) 07/29/2019  . Urethral stricture 10/30/2013  . Urinary incontinence   . Urinary incontinence without sensory awareness 07/17/2016  . Varicose veins of both lower extremities 04/24/2016  . Venous insufficiency (chronic) (peripheral)  06/06/2016    BP 140/84 (BP Location: Left Arm, Patient Position: Sitting, Cuff Size: Normal)   Pulse 77   Temp 97.8 F (36.6 C) (Oral)   Ht 5' 6.5" (1.689 m)   Wt 141 lb 4 oz (64.1 kg)   SpO2 97%   BMI 22.46 kg/m  Gen.: Awake, alert, appears stated age 70: Mucous membranes moist without mucosal lesions Heart: Regular rate and rhythm without murmurs Lungs: Clear auscultation bilaterally, no rales or wheezing, normal effort without accessory muscle use. Abdomen: Bowel sounds are present. Abdomen is soft, diffusely ttp, nondistended, no masses or organomegaly. Negative Murphy's, Rovsing's, McBurney's, and Carnett's sign. Psych: Age appropriate judgment and insight. Normal mood and affect.  BRBPR (bright red blood per rectum) - Plan: Ambulatory referral to Gastroenterology, CBC  Generalized abdominal pain - Plan: Ambulatory referral to Gastroenterology, ondansetron (ZOFRAN-ODT) 4 MG disintegrating tablet, dicyclomine (BENTYL) 10 MG capsule  Hematuria, unspecified type - Plan: nitrofurantoin, macrocrystal-monohydrate, (MACROBID) 100 MG capsule, Urine Culture, Urine Microscopic Only  Senile purpura ([place])  1. Ck labs. Will need to see GI anyway. 2. Tx symptomatically.  3. Having UTI s/s's. Will empirically tx. Ck microscopy and cx.  4. Chronic issue, not indicative of bleeding disorder.  F/u pending above Pt voiced understanding and agreement to the plan.  [place], DO 06/15/20 11:00 AM

## 2020-06-15 NOTE — Patient Instructions (Addendum)
Stay hydrated.  Give [place] 2-3 business days to get the results of your labs back.   If you do not hear anything about your referral in the next week, call our office and ask for an update.  Keep the diet clean and stay active.

## 2020-06-16 LAB — URINE CULTURE
MICRO NUMBER:: 11481258
Result:: NO GROWTH
SPECIMEN QUALITY:: ADEQUATE

## 2020-07-01 ENCOUNTER — Encounter: Payer: Self-pay | Admitting: Gastroenterology

## 2020-07-01 ENCOUNTER — Telehealth: Payer: Self-pay

## 2020-07-01 ENCOUNTER — Ambulatory Visit: Payer: PPO | Admitting: Gastroenterology

## 2020-07-01 VITALS — BP 126/84 | HR 56 | Ht 66.5 in | Wt 142.5 lb

## 2020-07-01 DIAGNOSIS — K921 Melena: Secondary | ICD-10-CM

## 2020-07-01 DIAGNOSIS — R1084 Generalized abdominal pain: Secondary | ICD-10-CM

## 2020-07-01 DIAGNOSIS — K635 Polyp of colon: Secondary | ICD-10-CM | POA: Diagnosis not present

## 2020-07-01 DIAGNOSIS — K219 Gastro-esophageal reflux disease without esophagitis: Secondary | ICD-10-CM | POA: Diagnosis not present

## 2020-07-01 DIAGNOSIS — R1310 Dysphagia, unspecified: Secondary | ICD-10-CM

## 2020-07-01 DIAGNOSIS — R110 Nausea: Secondary | ICD-10-CM | POA: Diagnosis not present

## 2020-07-01 MED ORDER — NA SULFATE-K SULFATE-MG SULF 17.5-3.13-1.6 GM/177ML PO SOLN: 1.0000 | Freq: Once | ORAL | 0 refills | Status: AC

## 2020-07-01 MED ORDER — PANTOPRAZOLE SODIUM 40 MG PO TBEC: 40.0000 mg | DELAYED_RELEASE_TABLET | Freq: Two times a day (BID) | ORAL | 3 refills | Status: DC

## 2020-07-01 NOTE — Telephone Encounter (Signed)
[facility] Pre-operative Risk Assessment     Request for surgical clearance:     Endoscopy Procedure  What type of surgery is being performed?     EGD/Colon  When is this surgery scheduled?     08/20/2020  What type of clearance is required ?   Pharmacy  Are there any medications that need to be held prior to surgery and how long? Cardiac Clearance   Practice name and name of physician performing surgery?      [facility]  What is your office phone and fax number?      Phone- [phone]  [phone]  Anesthesia type (None, local, MAC, general) ?       MAC

## 2020-07-01 NOTE — Progress Notes (Signed)
Chief Complaint: Hematochezia, abdominal pain   Referring Provider:   Shelda Pal, DO   HPI:     Jenna Campbell is a 70 y.o. female with a history of multiple medical problems as below, referred to the Gastroenterology Clinic for evaluation of hematochezia and generalized abdominal pain.  Was seen by [facility] for this issue on 06/15/2020, and started on Bentyl and Zofran, along with Macrobid for hematuria.  CBC was normal.  She states BRBPR started late January with BRB and clots in commode. Had associated upper/lower abdominal pain then nausea without emesis. Changed to BRAT diet and rest. Nausea now resolved, pain improving but still present. Started eating Activia, increasing dietary fiber.   Had Abx x2 courses for URI sxs in December 2021. Covid testing negative.   Does have intermittent dysphagia. Cuts food into small pieces, mostly softer foods, and chews thoroughly. Reflux well controlled with pantoprazole 40 mg/day, but does have increased post nasal drainage and increased throat clearing lately.    Previously followed at [facility], last seen in 05/2016 for ongoing evaluation of alternating bowel habits, dysphagia, and GERD.  -EGD (2011): Esophageal stricture s/p dilation, biopsies negative for EOE -Colonoscopy (04/2013): Hyperplastic polyps, diverticulosis -CT abdomen/pelvis (02/2016): Diverticulosis, otherwise normal -EGD (04/2016): Benign intrinsic stricture at GEJ (s/p dilation, bx neg for BE/EoE), Gastritis (bx neg for H. pylori), multiple gastric polyps present (sampling revealed benign fundic gland polyps), normal duodenum (bx neg for Celiac) -Colonoscopy (04/2016): Normal TI (4cm), moderate left-sided diverticulosis, 5 TA polyps (14mm sessile AC, 66mm sessile AC, 78mm sessile TC, 40mm DC, 100mm sessile DC) were removed, and internal hemorrhoids. Repeat colonoscopy in 12/20     Past Medical History:  Diagnosis Date   Acute bronchitis due to  other specified organisms 04/21/2015   Acute tension-type headache 12/16/2015   Adjustment disorder with anxious mood 04/21/2015   Allergic asthma with acute exacerbation 09/22/2014   Allergic rhinitis due to pollen 07/23/2014   Allergic urticaria 05/13/2015   Allergy    Alopecia 10/30/2013   Angina pectoris ([place]) 07/29/2019   Arthritis 04/24/2016   Asthma    Atrophic vaginitis 10/30/2013   Basal cell carcinoma 04/21/2015   Bilateral impacted cerumen 04/21/2015   CAD (coronary artery disease) 10/28/2019   Cancer ([place])    skin cancer   Cervical radiculopathy 09/17/2015   Change in bowel function 06/14/2016   Chest pain in adult 05/28/2017   Chest tightness 07/29/2019   Chronic diarrhea 12/16/2015   Colon polyps    Cough 02/14/2016   Cystocele, midline 10/30/2013   Dermatitis 09/22/2014   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   Diverticulosis of large intestine 10/30/2013   Dupuytren's contracture of both hands 09/17/2019   Dyspareunia 07/23/2014   Dysuria 12/16/2015   Encounter for long-term (current) use of other medications 10/19/2012   Esophageal dysphagia 06/14/2016   Essential hypertension    Family history of pheochromocytoma 02/14/2016   Foot pain, bilateral 01/05/2018   Gastroesophageal reflux disease    Gastroesophageal reflux disease without esophagitis 05/13/2015   Generalized abdominal pain 12/16/2015   GERD (gastroesophageal reflux disease)    H/O: hysterectomy    Herpes simplex 08/17/2016   History of adenomatous polyp of colon 06/14/2016   History of blood transfusion 1970   Hx of migraines 05/15/2009   Hypothyroidism 10/30/2013   Hypotonic bladder 10/30/2013   Insomnia 10/11/2017   Localized edema 12/16/2015   Low back pain 09/17/2015  Lumbar paraspinal muscle spasm 04/21/2015   Major depression, recurrent ([place]) 07/14/2013   Major depressive disorder, recurrent, mild ([place]) 03/14/2017   Malaise and fatigue 04/21/2015   MCI (mild cognitive impairment)  08/18/2015   Memory difficulty 09/17/2015   Menopause 03/31/2014   Midline low back pain without sciatica 03/31/2014   Migraine    Mild intermittent extrinsic asthma 12/16/2015   Mild persistent asthma 05/13/2015   Mixed dyslipidemia 11/12/2019   Neck pain 09/17/2015   Neuropathy 09/22/2014   Nonintractable headache 11/02/2016   Other allergic rhinitis 05/13/2015   Palpitation 09/03/2017   Pedal edema 07/29/2019   Pelvic pain in female 02/17/2015   Personality disorder ([place]) 10/19/2012   Pharyngoesophageal dysphagia 04/21/2015   Plantar fasciitis 01/20/2016   Precordial chest pain 08/18/2015   Prolapse of vaginal vault after hysterectomy 10/30/2013   Rectocele 10/30/2013   Seasonal allergies 10/15/2019   Shin splint 04/21/2015   Shortness of breath 07/17/2016   Sinusitis, chronic 10/30/2013   Slow transit constipation 07/23/2014   Stable angina (HCC) 10/28/2019   Swelling of lower limb 05/17/2016   Tension-type headache, not intractable 09/17/2015   Thyroid disease    Tinnitus of both ears 10/11/2017   Unstable angina ([place]) 07/29/2019   Urethral stricture 10/30/2013   Urinary incontinence    Urinary incontinence without sensory awareness 07/17/2016   Varicose veins of both lower extremities 04/24/2016   Venous insufficiency (chronic) (peripheral) 06/06/2016     Past Surgical History:  Procedure Laterality Date   BREAST CYST ASPIRATION Left    25 years ago    INTRAVASCULAR PRESSURE WIRE/FFR STUDY N/A 10/17/2019   Procedure: INTRAVASCULAR PRESSURE WIRE/FFR STUDY;  Surgeon: Nelva Bush, MD;  Location: [facility];  Service: Cardiovascular;  Laterality: N/A;   LEFT HEART CATH AND CORONARY ANGIOGRAPHY N/A 10/17/2019   Procedure: LEFT HEART CATH AND CORONARY ANGIOGRAPHY;  Surgeon: Nelva Bush, MD;  Location: [facility];  Service: Cardiovascular;  Laterality: N/A;   MOHS SURGERY  10/05/2015   RECTOCELE REPAIR  2011   SKIN CANCER EXCISION  02/2015    Squamous cell removed from back   TONSILLECTOMY     VAGINAL HYSTERECTOMY     VAGINAL PROLAPSE REPAIR     Family History  Problem Relation Age of Onset   Asthma Father    Angina Father    Emphysema Father    Alzheimer's disease Mother    Cancer Sister        died of cancer   Social History   Tobacco Use   Smoking status: Never Smoker   Smokeless tobacco: Never Used  Vaping Use   Vaping Use: Never used  Substance Use Topics   Alcohol use: Yes   Drug use: No   Current Outpatient Medications  Medication Sig Dispense Refill   albuterol (PROVENTIL) (2.5 MG/3ML) 0.083% nebulizer solution Take 3 mLs (2.5 mg total) by nebulization every 6 (six) hours as needed for wheezing or shortness of breath. 75 mL 0   ASPIRIN 81 PO Take 81 mg by mouth daily.      atorvastatin (LIPITOR) 20 MG tablet Take 1 tablet (20 mg total) by mouth daily. 30 tablet 6   azelastine (ASTELIN) 0.1 % nasal spray Place 2 sprays into both nostrils 2 (two) times daily. Use in each nostril as directed 30 mL 12   betamethasone valerate (VALISONE) 0.1 % cream APPLY TO AFFECTED AREA TWICE A DAY 30 g 0   Coenzyme Q10 (CO Q-10) 200 MG CAPS Take  200 mg by mouth daily. 90 capsule 3   dicyclomine (BENTYL) 10 MG capsule Take 1 tab every 6 hours as needed for abdominal cramping. 60 capsule 0   fluticasone (FLONASE) 50 MCG/ACT nasal spray Place 2 sprays into both nostrils daily. 16 g 2   gabapentin (NEURONTIN) 300 MG capsule Take 300-600 mg by mouth See admin instructions. 300 mg in the morning, 300-600 mg at bedtime     isosorbide mononitrate (IMDUR) 30 MG 24 hr tablet Take 0.5 tablets (15 mg total) by mouth daily. 30 tablet 5   levothyroxine (SYNTHROID) 150 MCG tablet TAKE 1 & 1/2 TABLETS BY MOUTH DAILY BEFORE BREAKFAST 135 tablet 0   LORazepam (ATIVAN) 0.5 MG tablet Take 0.5 mg by mouth every 8 (eight) hours as needed for anxiety.      nitroGLYCERIN (NITROSTAT) 0.4 MG SL tablet PLACE 1 TABLET (0.4 MG  TOTAL) UNDER THE TONGUE EVERY 5 (FIVE) MINUTES AS NEEDED FOR CHEST PAIN. 25 tablet 3   olopatadine (PATANOL) 0.1 % ophthalmic solution Place 1 drop into both eyes 2 (two) times daily. 5 mL 1   ondansetron (ZOFRAN-ODT) 4 MG disintegrating tablet Take 1 tablet (4 mg total) by mouth every 8 (eight) hours as needed for nausea or vomiting. 20 tablet 0   pantoprazole (PROTONIX) 40 MG tablet Take 1 tablet (40 mg total) by mouth daily. 30 tablet 3   temazepam (RESTORIL) 15 MG capsule Take 15 mg by mouth at bedtime.      topiramate (TOPAMAX) 100 MG tablet Take 100 mg by mouth at bedtime.      valACYclovir (VALTREX) 500 MG tablet TAKE TWO TABLETS BY MOUTH ONE TIME DAILY FOR 5 DAYS WHEN OUTBREAK OCCURS. 60 tablet 2   No current facility-administered medications for this visit.   Allergies  Allergen Reactions   Iodinated Diagnostic Agents Itching, Other (See Comments), Rash and Shortness Of Breath    Throat Swelling, Erythema   Kenalog [Triamcinolone Acetonide] Swelling   Metrizamide Itching, Other (See Comments), Rash and Shortness Of Breath    Throat Swelling, Erythema   Other Itching and Swelling    DUST   Penicillins Anaphylaxis and Itching   Montelukast Sodium Other (See Comments)    Chest pain, dizziness and lightheadedness   Xyzal [Levocetirizine]     Chest pressure   Ciprofloxacin Itching   Diazepam Rash   Latex Itching, Rash and Swelling   Sulfa Antibiotics Rash     Review of Systems: All systems reviewed and negative except where noted in HPI.     Physical Exam:    Wt Readings from Last 3 Encounters:  06/15/20 141 lb 4 oz (64.1 kg)  04/30/20 144 lb 12.8 oz (65.7 kg)  04/26/20 141 lb (64 kg)    There were no vitals taken for this visit. Constitutional:  Pleasant, in no acute distress. Psychiatric: Normal mood and affect. Behavior is normal. EENT: Pupils normal.  Conjunctivae are normal. No scleral icterus. Neck supple. No cervical LAD. Cardiovascular:  Normal rate, regular rhythm. No edema Pulmonary/chest: Effort normal and breath sounds normal. No wheezing, rales or rhonchi. Abdominal: Mild TTP upper greater than lower abdomen.  No rebound or guarding.  No peritoneal signs.  Soft, nondistended. Bowel sounds active throughout. There are no masses palpable. No hepatomegaly. Neurological: Alert and oriented to person place and time. Skin: Skin is warm and dry. No rashes noted.   ASSESSMENT AND PLAN;   1) Hematochezia 2) Generalized abdominal pain 3) Change in bowel habits 4) Nausea without  emesis  -CT abdomen/pelvis -Recent labs otherwise normal -EGD and colonoscopy to evaluate for mucosal/luminal pathology to include random/directed biopsies -Increase PPI to BID  5) Dysphagia 6) GERD -Longstanding history of dysphagia, but seems worse lately. -EGD with esophageal dilation and/for esophageal biopsies as appropriate -Increasing PPI as above -Continue cutting food into small pieces, chewing thoroughly, and plenty of fluids with meals -Continue Teruflex lifestyle/dietary modifications  7) History of colon polyps: -Overdue for repeat colonoscopy for ongoing polyp surveillance -Conduct polyp surveillance at time of colonoscopy as above  The indications, risks, and benefits of EGD and colonoscopy were explained to the patient in detail. Risks include but are not limited to bleeding, perforation, adverse reaction to medications, and cardiopulmonary compromise. Sequelae include but are not limited to the possibility of surgery, hospitalization, and mortality. The patient verbalized understanding and wished to proceed. All questions answered, referred to scheduler and bowel prep ordered. Further recommendations pending results of the exam.    Lavena Bullion, DO, FACG  07/01/2020, 10:24 AM   Nani Ravens, [place]*

## 2020-07-01 NOTE — Patient Instructions (Addendum)
If you are age 71 or older, your body mass index should be between 23-30. Your Body mass index is 22.66 kg/m. If this is out of the aforementioned range listed, please consider follow up with your Primary Care Provider.  If you are age 33 or younger, your body mass index should be between 19-25. Your Body mass index is 22.66 kg/m. If this is out of the aformentioned range listed, please consider follow up with your Primary Care Provider.   We have sent the following medications to your pharmacy for you to pick up at your convenience:  Protonix 76m increase to twice a day.  [id number]   You are scheduled on 07/05/2020 at 1:30pm. You should arrive 15 minutes prior to your appointment time for registration. Please follow the written instructions below on the day of your exam:  WARNING: IF YOU ARE ALLERGIC TO IODINE/X-RAY DYE, PLEASE NOTIFY RADIOLOGY IMMEDIATELY AT [phone] YOU WILL BE GIVEN A 13 HOUR PREMEDICATION PREP.  1) Do not eat or drink anything after 9:30am (4 hours prior to your test) 2) You have been given 2 bottles of oral contrast to drink. The solution may taste better if refrigerated, but do NOT add ice or any other liquid to this solution. Shake well before drinking.    Drink 1 bottle of contrast @ 11:30am (2 hours prior to your exam)  Drink 1 bottle of contrast @ 12:30pm (1 hour prior to your exam)  You may take any medications as prescribed with a small amount of water, if necessary. If you take any of the following medications: METFORMIN, GLUCOPHAGE, GLUCOVANCE, AVANDAMET, RIOMET, FORTAMET, AMesaMET, JANUMET, GLUMETZA or METAGLIP, you MAY be asked to HOLD this medication 48 hours AFTER the exam.  The purpose of you drinking the oral contrast is to aid in the visualization of your intestinal tract. The contrast solution may  cause some diarrhea. Depending on your individual set of symptoms, you may also receive an intravenous injection of x-ray contrast/dye. [facility], depending on the type of exam you are having performed.  This test typically takes 30-45 minutes to complete.  If you have any questions regarding your exam or if you need to reschedule, you may call the CT department at [phone] the hours of 8:00 am and 5:00 pm, Monday-Friday.  ________________________________________________________________________  Due to recent changes in healthcare laws, you may see the results of your imaging and laboratory studies on MyChart before your provider has had a chance to review them.  We understand that in some cases there may be results that are confusing or concerning to you. Not all laboratory results come back in the same time frame and the provider may be waiting for multiple results in order to interpret others.  Please give [id number] hours in order for your provider to thoroughly review all the results before contacting the office for clarification of your results.   Thank you for choosing [facility] and [facility].  Vito Cirigliano, D.O.

## 2020-07-01 NOTE — Telephone Encounter (Signed)
The request is not exactly clear as to what the question is and what medication specifically is referring to.

## 2020-07-01 NOTE — Addendum Note (Signed)
Addended by: Villa Herb on: 07/01/2020 04:38 PM   Modules accepted: Orders

## 2020-07-02 ENCOUNTER — Telehealth: Payer: Self-pay | Admitting: General Surgery

## 2020-07-02 NOTE — Telephone Encounter (Signed)
Contacted the patient and advised her that we have received Cardiac Clearance. Pt verbalized understanding.

## 2020-07-02 NOTE — Telephone Encounter (Signed)
I apologize Dr Geraldo Pitter. This is to for cardiac clearance only. The patient is on no anticoagulation.  [facility] Pre-operative Risk Assessment     Request for surgical clearance:     Endoscopy Procedure  What type of surgery is being performed?     *EGD/Colon  When is this surgery scheduled?     08/20/2020  What type of clearance is required ?   Cardiac  Are there any medications that need to be held prior to surgery and how long? No  Practice name and name of physician performing surgery?      [facility]  What is your office phone and fax number?      Phone- [phone]  [phone]  Anesthesia type (None, local, MAC, general) ?       MAC

## 2020-07-02 NOTE — Telephone Encounter (Signed)
   Primary Cardiologist: Jenean Lindau, MD  Chart reviewed as part of pre-operative protocol coverage. Given past medical history and time since last visit, based on ACC/AHA guidelines, Jenna Campbell would be at acceptable risk for the planned procedure without further cardiovascular testing. She was last seen 04/2020 and doing well at that time.   The patient was advised that if she develops new symptoms prior to surgery to contact our office to arrange for a follow-up visit, and she verbalized understanding.  I will route this recommendation to the requesting party via Epic fax function and remove from pre-op pool.  Please call with questions.  Loel Dubonnet, NP 07/02/2020, 8:40 AM

## 2020-07-05 ENCOUNTER — Ambulatory Visit (HOSPITAL_BASED_OUTPATIENT_CLINIC_OR_DEPARTMENT_OTHER)
Admission: RE | Admit: 2020-07-05 | Discharge: 2020-07-05 | Disposition: A | Discharge status: Home / Self Care | Payer: PPO | Source: Ambulatory Visit | Attending: Gastroenterology | Admitting: Gastroenterology

## 2020-07-05 ENCOUNTER — Other Ambulatory Visit: Payer: Self-pay

## 2020-07-05 DIAGNOSIS — K5730 Diverticulosis of large intestine without perforation or abscess without bleeding: Secondary | ICD-10-CM | POA: Diagnosis not present

## 2020-07-05 DIAGNOSIS — R634 Abnormal weight loss: Secondary | ICD-10-CM | POA: Diagnosis not present

## 2020-07-05 DIAGNOSIS — R1084 Generalized abdominal pain: Secondary | ICD-10-CM | POA: Insufficient documentation

## 2020-07-05 DIAGNOSIS — Z90710 Acquired absence of both cervix and uterus: Secondary | ICD-10-CM | POA: Diagnosis not present

## 2020-07-05 DIAGNOSIS — K7689 Other specified diseases of liver: Secondary | ICD-10-CM | POA: Diagnosis not present

## 2020-07-12 DIAGNOSIS — F332 Major depressive disorder, recurrent severe without psychotic features: Secondary | ICD-10-CM | POA: Diagnosis not present

## 2020-07-17 ENCOUNTER — Other Ambulatory Visit: Payer: Self-pay | Admitting: Cardiology

## 2020-07-19 NOTE — Telephone Encounter (Signed)
Rx refill sent to pharmacy. 

## 2020-08-04 DIAGNOSIS — F449 Dissociative and conversion disorder, unspecified: Secondary | ICD-10-CM | POA: Diagnosis not present

## 2020-08-04 DIAGNOSIS — F609 Personality disorder, unspecified: Secondary | ICD-10-CM | POA: Diagnosis not present

## 2020-08-04 DIAGNOSIS — F5101 Primary insomnia: Secondary | ICD-10-CM | POA: Diagnosis not present

## 2020-08-04 DIAGNOSIS — F4322 Adjustment disorder with anxiety: Secondary | ICD-10-CM | POA: Diagnosis not present

## 2020-08-04 DIAGNOSIS — F332 Major depressive disorder, recurrent severe without psychotic features: Secondary | ICD-10-CM | POA: Diagnosis not present

## 2020-08-04 DIAGNOSIS — Z79899 Other long term (current) drug therapy: Secondary | ICD-10-CM | POA: Diagnosis not present

## 2020-08-04 DIAGNOSIS — F330 Major depressive disorder, recurrent, mild: Secondary | ICD-10-CM | POA: Diagnosis not present

## 2020-08-17 ENCOUNTER — Telehealth: Payer: Self-pay | Admitting: Gastroenterology

## 2020-08-17 NOTE — Telephone Encounter (Signed)
Hey Dr Bryan Lemma, this pt canceled her EGD/Colon scheduled for 4/8 Friday due to not having transportation, she will call back to reschedule

## 2020-08-17 NOTE — Telephone Encounter (Signed)
Thank you for the update!

## 2020-08-20 ENCOUNTER — Encounter: Payer: PPO | Admitting: Gastroenterology

## 2020-09-08 ENCOUNTER — Telehealth: Payer: Self-pay | Admitting: Family Medicine

## 2020-09-08 DIAGNOSIS — J4540 Moderate persistent asthma, uncomplicated: Secondary | ICD-10-CM

## 2020-09-08 MED ORDER — ALBUTEROL SULFATE HFA 108 (90 BASE) MCG/ACT IN AERS: 2.0000 | INHALATION_SPRAY | RESPIRATORY_TRACT | 1 refills | Status: DC | PRN

## 2020-09-08 NOTE — Telephone Encounter (Signed)
Patient states she would like a refill on the pro air inhaler for PRN use.  Medication: albuterol (PROAIR HFA) 108 (90 Base) MCG/ACT inhaler   Has the patient contacted their pharmacy? No. (If no, request that the patient contact the pharmacy for the refill.) (If yes, when and what did the pharmacy advise?)  Preferred Pharmacy (with phone number or street name):  [facility], [place] - [address] Phone:  [phone]  Fax:  [phone]      Agent: Please be advised that RX refills may take up to 3 business days. We ask that you follow-up with your pharmacy.

## 2020-09-09 DIAGNOSIS — F332 Major depressive disorder, recurrent severe without psychotic features: Secondary | ICD-10-CM | POA: Diagnosis not present

## 2020-09-17 ENCOUNTER — Other Ambulatory Visit: Payer: Self-pay | Admitting: Family Medicine

## 2020-09-20 ENCOUNTER — Other Ambulatory Visit: Payer: Self-pay | Admitting: Family Medicine

## 2020-09-20 DIAGNOSIS — E2839 Other primary ovarian failure: Secondary | ICD-10-CM

## 2020-09-20 DIAGNOSIS — Z1231 Encounter for screening mammogram for malignant neoplasm of breast: Secondary | ICD-10-CM

## 2020-09-21 ENCOUNTER — Encounter: Payer: Self-pay | Admitting: Family Medicine

## 2020-09-21 ENCOUNTER — Ambulatory Visit (HOSPITAL_BASED_OUTPATIENT_CLINIC_OR_DEPARTMENT_OTHER)
Admission: RE | Admit: 2020-09-21 | Discharge: 2020-09-21 | Disposition: A | Discharge status: Home / Self Care | Payer: PPO | Source: Ambulatory Visit | Attending: Family Medicine | Admitting: Family Medicine

## 2020-09-21 ENCOUNTER — Telehealth: Payer: PPO | Admitting: Family Medicine

## 2020-09-21 ENCOUNTER — Encounter (HOSPITAL_BASED_OUTPATIENT_CLINIC_OR_DEPARTMENT_OTHER): Payer: Self-pay

## 2020-09-21 ENCOUNTER — Telehealth (INDEPENDENT_AMBULATORY_CARE_PROVIDER_SITE_OTHER): Payer: PPO | Admitting: Family Medicine

## 2020-09-21 ENCOUNTER — Other Ambulatory Visit: Payer: Self-pay

## 2020-09-21 DIAGNOSIS — J4531 Mild persistent asthma with (acute) exacerbation: Secondary | ICD-10-CM | POA: Diagnosis not present

## 2020-09-21 DIAGNOSIS — J301 Allergic rhinitis due to pollen: Secondary | ICD-10-CM

## 2020-09-21 DIAGNOSIS — Z1231 Encounter for screening mammogram for malignant neoplasm of breast: Secondary | ICD-10-CM | POA: Diagnosis not present

## 2020-09-21 DIAGNOSIS — Z5329 Procedure and treatment not carried out because of patient's decision for other reasons: Secondary | ICD-10-CM

## 2020-09-21 DIAGNOSIS — M8589 Other specified disorders of bone density and structure, multiple sites: Secondary | ICD-10-CM | POA: Diagnosis not present

## 2020-09-21 DIAGNOSIS — E2839 Other primary ovarian failure: Secondary | ICD-10-CM | POA: Insufficient documentation

## 2020-09-21 MED ORDER — PREDNISONE 20 MG PO TABS: 40.0000 mg | ORAL_TABLET | Freq: Every day | ORAL | 0 refills | Status: AC

## 2020-09-21 NOTE — Progress Notes (Signed)
Chief Complaint  Patient presents with  . Ear Pain  . Sinus Problem    Jenna Campbell here for URI complaints. Due to COVID-19 pandemic, we are interacting via web portal for an electronic face-to-face visit. I verified patient's ID using 2 identifiers. Patient agreed to proceed with visit via this method. Patient is at home, I am at home. Patient and I are present for visit.   Duration: 3 days  Associated symptoms: sinus congestion, rhinorrhea, ear fullness, ear pain, wheezing, shortness of breath, chest tightness, sinus pressure, and coughing Denies: sinus pain, itchy watery eyes, ear drainage, sore throat, myalgia and fevers Treatment to date: SABA has helped, Benadryl, sinus rinses; compliant w sinus meds Astelin, Xyzal, Flonase Sick contacts: No  Weather changes flared things up  Past Medical History:  Diagnosis Date  . Acute bronchitis due to other specified organisms 04/21/2015  . Acute tension-type headache 12/16/2015  . Adjustment disorder with anxious mood 04/21/2015  . Allergic asthma with acute exacerbation 09/22/2014  . Allergic rhinitis due to pollen 07/23/2014  . Allergic urticaria 05/13/2015  . Allergy   . Alopecia 10/30/2013  . Angina pectoris ([place]) 07/29/2019  . Arthritis 04/24/2016  . Asthma   . Atrophic vaginitis 10/30/2013  . Basal cell carcinoma 04/21/2015  . Bilateral impacted cerumen 04/21/2015  . CAD (coronary artery disease) 10/28/2019  . Cancer ([place])    skin cancer  . Cervical radiculopathy 09/17/2015  . Change in bowel function 06/14/2016  . Chest pain in adult 05/28/2017  . Chest tightness 07/29/2019  . Chronic diarrhea 12/16/2015  . Colon polyps   . Cough 02/14/2016  . Cystocele, midline 10/30/2013  . Dermatitis 09/22/2014  . 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 Diverticulosis of large intestine 10/30/2013  . Dupuytren's contracture of both hands 09/17/2019  . Dyspareunia 07/23/2014  . Dysuria 12/16/2015  . Encounter for long-term (current) use of other medications  10/19/2012  . Esophageal dysphagia 06/14/2016  . Essential hypertension   . Family history of pheochromocytoma 02/14/2016  . Foot pain, bilateral 01/05/2018  . Gastroesophageal reflux disease   . Gastroesophageal reflux disease without esophagitis 05/13/2015  . Generalized abdominal pain 12/16/2015  . GERD (gastroesophageal reflux disease)   . H/O: hysterectomy   . Herpes simplex 08/17/2016  . History of adenomatous polyp of colon 06/14/2016  . History of blood transfusion 1970  . Hx of migraines 05/15/2009  . Hypothyroidism 10/30/2013  . Hypotonic bladder 10/30/2013  . Insomnia 10/11/2017  . Localized edema 12/16/2015  . Low back pain 09/17/2015  . Lumbar paraspinal muscle spasm 04/21/2015  . Major depression, recurrent ([place]) 07/14/2013  . Major depressive disorder, recurrent, mild ([place]) 03/14/2017  . Malaise and fatigue 04/21/2015  . MCI (mild cognitive impairment) 08/18/2015  . Memory difficulty 09/17/2015  . Menopause 03/31/2014  . Midline low back pain without sciatica 03/31/2014  . Migraine   . Mild intermittent extrinsic asthma 12/16/2015  . Mild persistent asthma 05/13/2015  . Mixed dyslipidemia 11/12/2019  . Neck pain 09/17/2015  . Neuropathy 09/22/2014  . Nonintractable headache 11/02/2016  . Other allergic rhinitis 05/13/2015  . Palpitation 09/03/2017  . Pedal edema 07/29/2019  . Pelvic pain in female 02/17/2015  . Personality disorder ([place]) 10/19/2012  . Pharyngoesophageal dysphagia 04/21/2015  . Plantar fasciitis 01/20/2016  . Precordial chest pain 08/18/2015  . Prolapse of vaginal vault after hysterectomy 10/30/2013  . Rectocele 10/30/2013  . Seasonal allergies 10/15/2019  . Shin splint 04/21/2015  . Shortness of breath 07/17/2016  . Sinusitis, chronic 10/30/2013  .  Slow transit constipation 07/23/2014  . Stable angina ([place]) 10/28/2019  . Swelling of lower limb 05/17/2016  . Tension-type headache, not intractable 09/17/2015  . Thyroid disease   . Tinnitus of both ears 10/11/2017  . Unstable angina ([place])  07/29/2019  . Urethral stricture 10/30/2013  . Urinary incontinence   . Urinary incontinence without sensory awareness 07/17/2016  . Varicose veins of both lower extremities 04/24/2016  . Venous insufficiency (chronic) (peripheral) 06/06/2016    Objective No conversational dyspnea Age appropriate judgment and insight Nml affect and mood  Mild persistent asthma with exacerbation - Plan: predniSONE (DELTASONE) 20 MG tablet  Seasonal allergic rhinitis due to pollen - Plan: predniSONE (DELTASONE) 20 MG tablet  Exacerbation of chronic issue. Cont SABA prn. Cont Xyzal, Astelin, Flonase. 5 d pred burst, 40 mg/d. Send message if no improvement.  Continue to push fluids, practice good hand hygiene, cover mouth when coughing. F/u prn. If starting to experience fevers, shaking, or shortness of breath, seek immediate care. Pt voiced understanding and agreement to the plan.  [place], DO 09/21/20 3:52 PM

## 2020-09-21 NOTE — Progress Notes (Signed)
After several attempts to reach patient, we were unsuccessful.

## 2020-10-12 ENCOUNTER — Other Ambulatory Visit: Payer: Self-pay

## 2020-10-13 ENCOUNTER — Ambulatory Visit: Payer: PPO | Admitting: Cardiology

## 2020-10-13 ENCOUNTER — Other Ambulatory Visit: Payer: Self-pay

## 2020-10-13 ENCOUNTER — Encounter: Payer: Self-pay | Admitting: Cardiology

## 2020-10-13 VITALS — BP 124/84 | HR 74 | Ht 66.5 in | Wt 141.0 lb

## 2020-10-13 DIAGNOSIS — E782 Mixed hyperlipidemia: Secondary | ICD-10-CM

## 2020-10-13 DIAGNOSIS — I10 Essential (primary) hypertension: Secondary | ICD-10-CM

## 2020-10-13 DIAGNOSIS — E039 Hypothyroidism, unspecified: Secondary | ICD-10-CM

## 2020-10-13 DIAGNOSIS — I2510 Atherosclerotic heart disease of native coronary artery without angina pectoris: Secondary | ICD-10-CM

## 2020-10-13 DIAGNOSIS — F332 Major depressive disorder, recurrent severe without psychotic features: Secondary | ICD-10-CM | POA: Diagnosis not present

## 2020-10-13 MED ORDER — CO Q-10 200 MG PO CAPS
200.0000 mg | ORAL_CAPSULE | Freq: Every day | ORAL | 3 refills | Status: AC
Start: 2020-10-13 — End: ?

## 2020-10-13 MED ORDER — LIVALO 2 MG PO TABS
1.0000 mg | ORAL_TABLET | Freq: Every day | ORAL | 3 refills | Status: DC
Start: 2020-10-13 — End: 2021-07-01

## 2020-10-13 NOTE — Progress Notes (Signed)
Cardiology Office Note:    Date:  10/13/2020   ID:  Jenna Campbell, DOB [birth year], MRN [id number]  PCP:  Jenna Pal, DO  Cardiologist:  Jenna Lindau, MD   Referring MD: Jenna Campbell*    ASSESSMENT:    1. Coronary artery disease involving native coronary artery of native heart without angina pectoris   2. Essential hypertension   3. Mixed dyslipidemia   4. Hypothyroidism, unspecified type    PLAN:    In order of problems listed above:  1. Coronary artery disease: Stable angina pectoris: Secondary prevention stressed with the patient.  Importance of compliance with diet medication stressed and she vocalized understanding.  She walks on a regular basis and I told her to walk at least half an hour a day 5 days a week and she promises to do so. 2. Essential hypertension: Blood pressure stable and diet was emphasized with 3. Mixed dyslipidemia: Lipids were reviewed.  Patient is not on statin therapy as she could not tolerate atorvastatin.  She is willing to try the statins.  I will start her on pitavastatin 2 mg half tablet daily.  Coenzyme Q 10 intake was also encouraged.  She will have a Chem-7 and liver panel monthly today.  She will be back in 6 weeks for liver lipid check and will be seen in follow-up appointment in 2 months or earlier if she has any concerns.   Medication Adjustments/Labs and Tests Ordered: Current medicines are reviewed at length with the patient today.  Concerns regarding medicines are outlined above.  Orders Placed This Encounter  Procedures  . Basic metabolic panel  . Hepatic function panel  . Basic metabolic panel  . CBC with Differential/Platelet  . Hepatic function panel  . Lipid panel  . TSH   Meds ordered this encounter  Medications  . Pitavastatin Calcium (LIVALO) 2 MG TABS    Sig: Take 0.5 tablets (1 mg total) by mouth daily.    Dispense:  45 tablet    Refill:  3  . Coenzyme Q10 (CO Q-10) 200 MG CAPS    Sig: Take 200  mg by mouth daily.    Dispense:  90 capsule    Refill:  3     Chief Complaint  Patient presents with  . Follow-up     History of Present Illness:    Jenna Campbell is a 70 y.o. female.  Patient has past medical history of coronary artery disease, essential hypertension and dyslipidemia.  She denies any problems at this time and takes care of activities of daily living.  No chest pain orthopnea or PND.  She takes care of activities of daily living and walks on a regular basis.  She has occasional chest pain every several months relieved with nitroglycerin.  Appears to be stable angina.  At the time of my evaluation, the patient is alert awake oriented and in no distress.    Past Medical History:  Diagnosis Date  . Acute bronchitis due to other specified organisms 04/21/2015  . Acute tension-type headache 12/16/2015  . Adjustment disorder with anxious mood 04/21/2015  . Allergic asthma with acute exacerbation 09/22/2014  . Allergic rhinitis due to pollen 07/23/2014  . Allergic urticaria 05/13/2015  . Allergy   . Alopecia 10/30/2013  . Angina pectoris ([place]) 07/29/2019  . Arthritis 04/24/2016  . Asthma   . Atrophic vaginitis 10/30/2013  . Basal cell carcinoma 04/21/2015  . Bilateral impacted cerumen 04/21/2015  . CAD (coronary artery disease) 10/28/2019  .  Cancer ([place])    skin cancer  . Cervical radiculopathy 09/17/2015  . Change in bowel function 06/14/2016  . Chest pain in adult 05/28/2017  . Chest tightness 07/29/2019  . Chronic diarrhea 12/16/2015  . Colon polyps   . Cough 02/14/2016  . Cystocele, midline 10/30/2013  . Dermatitis 09/22/2014  . 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 Diverticulosis of large intestine 10/30/2013  . Dupuytren's contracture of both hands 09/17/2019  . Dyspareunia 07/23/2014  . Dysuria 12/16/2015  . Encounter for long-term (current) use of other medications 10/19/2012  . Esophageal dysphagia 06/14/2016  . Essential hypertension   . Family history of pheochromocytoma  02/14/2016  . Foot pain, bilateral 01/05/2018  . Gastroesophageal reflux disease   . Gastroesophageal reflux disease without esophagitis 05/13/2015  . Generalized abdominal pain 12/16/2015  . GERD (gastroesophageal reflux disease)   . H/O: hysterectomy   . Herpes simplex 08/17/2016  . History of adenomatous polyp of colon 06/14/2016  . History of blood transfusion 1970  . Hx of migraines 05/15/2009  . Hypothyroidism 10/30/2013  . Hypotonic bladder 10/30/2013  . Insomnia 10/11/2017  . Localized edema 12/16/2015  . Low back pain 09/17/2015  . Lumbar paraspinal muscle spasm 04/21/2015  . Major depression, recurrent ([place]) 07/14/2013  . Major depressive disorder, recurrent, mild ([place]) 03/14/2017  . Malaise and fatigue 04/21/2015  . MCI (mild cognitive impairment) 08/18/2015  . Memory difficulty 09/17/2015  . Menopause 03/31/2014  . Midline low back pain without sciatica 03/31/2014  . Migraine   . Mild intermittent extrinsic asthma 12/16/2015  . Mild persistent asthma 05/13/2015  . Mixed dyslipidemia 11/12/2019  . Neck pain 09/17/2015  . Neuropathy 09/22/2014  . Nonintractable headache 11/02/2016  . Other allergic rhinitis 05/13/2015  . Palpitation 09/03/2017  . Pedal edema 07/29/2019  . Pelvic pain in female 02/17/2015  . Personality disorder ([place]) 10/19/2012  . Pharyngoesophageal dysphagia 04/21/2015  . Plantar fasciitis 01/20/2016  . Precordial chest pain 08/18/2015  . Primary insomnia 10/11/2017  . Prolapse of vaginal vault after hysterectomy 10/30/2013  . Rectocele 10/30/2013  . Seasonal allergies 10/15/2019  . Senile purpura ([place]) 06/15/2020  . Shin splint 04/21/2015  . Shortness of breath 07/17/2016  . Sinusitis, chronic 10/30/2013  . Slow transit constipation 07/23/2014  . Stable angina ([place]) 10/28/2019  . Swelling of lower limb 05/17/2016  . Tension-type headache, not intractable 09/17/2015  . Thyroid disease   . Tinnitus of both ears 10/11/2017  . Unstable angina ([place]) 07/29/2019  . Urethral stricture 10/30/2013  .  Urinary incontinence   . Urinary incontinence without sensory awareness 07/17/2016  . Varicose veins of both lower extremities 04/24/2016  . Venous insufficiency (chronic) (peripheral) 06/06/2016    Past Surgical History:  Procedure Laterality Date  . BREAST CYST ASPIRATION Left    25 years ago   . COLONOSCOPY  04/2016   [facility] Diverticulitis and Multiple colon polyps  . ESOPHAGOGASTRODUODENOSCOPY  04/2016   [facility]  . INTRAVASCULAR PRESSURE WIRE/FFR STUDY N/A 10/17/2019   Procedure: INTRAVASCULAR PRESSURE WIRE/FFR STUDY;  Surgeon: Nelva Bush, MD;  Location: [facility];  Service: Cardiovascular;  Laterality: N/A;  . LEFT HEART CATH AND CORONARY ANGIOGRAPHY N/A 10/17/2019   Procedure: LEFT HEART CATH AND CORONARY ANGIOGRAPHY;  Surgeon: Nelva Bush, MD;  Location: [facility];  Service: Cardiovascular;  Laterality: N/A;  . MOHS SURGERY  10/05/2015  . RECTOCELE REPAIR  2011  . SKIN CANCER EXCISION  02/2015   Squamous cell removed from back  .  TONSILLECTOMY    . VAGINAL HYSTERECTOMY    . VAGINAL PROLAPSE REPAIR      Current Medications: Current Meds  Medication Sig  . albuterol (PROAIR HFA) 108 (90 Base) MCG/ACT inhaler Inhale 2 puffs into the lungs every 4 (four) hours as needed for wheezing or shortness of breath.  Marland Kitchen albuterol (PROVENTIL) (2.5 MG/3ML) 0.083% nebulizer solution Take 3 mLs (2.5 mg total) by nebulization every 6 (six) hours as needed for wheezing or shortness of breath.  . ASPIRIN 81 PO Take 81 mg by mouth daily.   Marland Kitchen azelastine (ASTELIN) 0.1 % nasal spray Place 2 sprays into both nostrils 2 (two) times daily. Use in each nostril as directed  . betamethasone valerate (VALISONE) 0.1 % cream Apply 1 application topically 2 (two) times daily.  . Coenzyme Q10 (CO Q-10) 200 MG CAPS Take 200 mg by mouth daily.  . fluticasone (FLONASE) 50 MCG/ACT nasal spray Place 2 sprays into both nostrils daily.  Marland Kitchen gabapentin (NEURONTIN) 300 MG capsule Take  300 mg by mouth in the morning. And take 300-600 mg at bedtime  . isosorbide mononitrate (IMDUR) 30 MG 24 hr tablet Take 0.5 tablets (15 mg total) by mouth daily.  Marland Kitchen levocetirizine (XYZAL) 5 MG tablet Take 5 mg by mouth daily.  Marland Kitchen levothyroxine (SYNTHROID) 150 MCG tablet Take 1.5 tablets by mouth daily before breakfast.  . LORazepam (ATIVAN) 0.5 MG tablet Take 0.5 mg by mouth every 8 (eight) hours as needed for anxiety.   . nitroGLYCERIN (NITROSTAT) 0.4 MG SL tablet Place 0.4 mg under the tongue every 5 (five) minutes as needed for chest pain.  Marland Kitchen olopatadine (PATANOL) 0.1 % ophthalmic solution Place 1 drop into both eyes 2 (two) times daily.  . ondansetron (ZOFRAN-ODT) 4 MG disintegrating tablet Take 1 tablet (4 mg total) by mouth every 8 (eight) hours as needed for nausea or vomiting.  . pantoprazole (PROTONIX) 40 MG tablet Take 1 tablet (40 mg total) by mouth 2 (two) times daily.  . Pitavastatin Calcium (LIVALO) 2 MG TABS Take 0.5 tablets (1 mg total) by mouth daily.  . temazepam (RESTORIL) 15 MG capsule Take 15 mg by mouth at bedtime.   . topiramate (TOPAMAX) 100 MG tablet Take 100 mg by mouth at bedtime.   . [DISCONTINUED] Coenzyme Q10 (CO Q-10) 200 MG CAPS Take 200 mg by mouth daily.     Allergies:   Iodinated diagnostic agents, Kenalog [triamcinolone acetonide], Metrizamide, Other, Penicillins, Montelukast sodium, Xyzal [levocetirizine], Ciprofloxacin, Diazepam, Latex, and Sulfa antibiotics   Social History   Socioeconomic History  . Marital status: Divorced    Spouse name: Not on file  . Number of children: Not on file  . Years of education: Not on file  . Highest education level: Not on file  Occupational History  . Not on file  Tobacco Use  . Smoking status: Never Smoker  . Smokeless tobacco: Never Used  Vaping Use  . Vaping Use: Never used  Substance and Sexual Activity  . Alcohol use: Not Currently  . Drug use: No  . Sexual activity: Never    Partners: Male  Other  Topics Concern  . Not on file  Social History Narrative  . Not on file   Social Determinants of Health   Financial Resource Strain: Not on file  Food Insecurity: Not on file  Transportation Needs: Not on file  Physical Activity: Not on file  Stress: Not on file  Social Connections: Not on file     Family  History: The patient's family history includes Alzheimer's disease in her mother; Angina in her father; Asthma in her father; Cancer in her sister; Emphysema in her father. There is no history of Colon cancer or Esophageal cancer.  ROS:   Please see the history of present illness.    All other systems reviewed and are negative.  EKGs/Labs/Other Studies Reviewed:    The following studies were reviewed today: I discussed my findings with the patient at length.   Recent Labs: 05/20/2020: ALT 13; BUN 9; Creatinine, Ser 0.74; Potassium 4.2; Sodium 137; TSH 1.470 06/15/2020: Hemoglobin 13.9; Platelets 254.0  Recent Lipid Panel    Component Value Date/Time   CHOL 178 05/20/2020 0945   TRIG 134 05/20/2020 0945   HDL 43 05/20/2020 0945   CHOLHDL 4.1 05/20/2020 0945   LDLCALC 111 (H) 05/20/2020 0945    Physical Exam:    VS:  BP 124/84 (BP Location: Right Arm, Patient Position: Sitting, Cuff Size: Normal)   Pulse 74   Ht 5' 6.5" (1.689 m)   Wt 141 lb (64 kg)   SpO2 96%   BMI 22.42 kg/m     Wt Readings from Last 3 Encounters:  10/13/20 141 lb (64 kg)  07/01/20 142 lb 8 oz (64.6 kg)  06/15/20 141 lb 4 oz (64.1 kg)     GEN: Patient is in no acute distress HEENT: Normal NECK: No JVD; No carotid bruits LYMPHATICS: No lymphadenopathy CARDIAC: Hear sounds regular, 2/6 systolic murmur at the apex. RESPIRATORY:  Clear to auscultation without rales, wheezing or rhonchi  ABDOMEN: Soft, non-tender, non-distended MUSCULOSKELETAL:  No edema; No deformity  SKIN: Warm and dry NEUROLOGIC:  Alert and oriented x 3 PSYCHIATRIC:  Normal affect   Signed, Jenna Lindau, MD   10/13/2020 10:20 AM    [place]

## 2020-10-13 NOTE — Patient Instructions (Signed)
Medication Instructions:  Your physician has recommended you make the following change in your medication:   Start Livalo 2 mg take 1/2 tablet daily. Start CoQ 10 200 mg daily.  *If you need a refill on your cardiac medications before your next appointment, please call your pharmacy*   Lab Work: Your physician recommends that you have labs done in the office today. Your test included  basic metabolic panel and liver function.  Your physician recommends that you return for lab work in: 6 weeks (11/24/20). You need to have labs done when you are fasting.  You can come Monday through Friday 8:30 am to 12:00 pm and 1:15 to 4:30. You do not need to make an appointment as the order has already been placed. The labs you are going to have done are BMET, CBC, TSH, LFT and Lipids.  If you have labs (blood work) drawn today and your tests are completely normal, you will receive your results only by: Jenna Campbell MyChart Message (if you have MyChart) OR . A paper copy in the mail If you have any lab test that is abnormal or we need to change your treatment, we will call you to review the results.   Testing/Procedures: None ordered   Follow-Up: At [facility], you and your health needs are our priority.  As part of our continuing mission to provide you with exceptional heart care, we have created designated Provider Care Teams.  These Care Teams include your primary Cardiologist (physician) and Advanced Practice Providers (APPs -  Physician Assistants and Nurse Practitioners) who all work together to provide you with the care you need, when you need it.  We recommend signing up for the patient portal called "MyChart".  Sign up information is provided on this After Visit Summary.  MyChart is used to connect with patients for Virtual Visits (Telemedicine).  Patients are able to view lab/test results, encounter notes, upcoming appointments, etc.  Non-urgent messages can be sent to your provider as well.   To  learn more about what you can do with MyChart, go to [email or web address].    Your next appointment:   2 month(s)  The format for your next appointment:   In Person  Provider:   Jyl Heinz, MD   Other Instructions Pitavastatin oral tablets What is this medicine? PITAVASTATIN (pit A va STAT in) is known as a HMG-CoA reductase inhibitor or 'statin'. It lowers the level of cholesterol and triglycerides in the blood. Diet and lifestyle changes are often used with this drug. This medicine may be used for other purposes; ask your health care provider or pharmacist if you have questions. COMMON BRAND NAME(S): Livalo, Zypitamag What should I tell my health care provider before I take this medicine? They need to know if you have any of these conditions:  diabetes  if you often drink alcohol  history of stroke  kidney disease  liver disease  muscle aches or weakness  thyroid disease  an unusual or allergic reaction to pitavastatin, other medicines, foods, dyes, or preservatives  pregnant or trying to get pregnant  breast-feeding How should I use this medicine? Take this medicine by mouth with a glass of water. Follow the directions on the prescription label. You can take it with or without food. If it upsets your stomach, take it with food. Take your medicine at regular intervals. Do not take it more often than directed. Do not stop taking except on your doctor's advice. Talk to your pediatrician regarding the use of  this medicine in children. While this drug may be prescribed for children as young as 8 years for selected conditions, precautions do apply. Overdosage: If you think you have taken too much of this medicine contact a poison control center or emergency room at once. NOTE: This medicine is only for you. Do not share this medicine with others. What if I miss a dose? If you miss a dose, take it as soon as you can. If it is almost time for your next dose, take only  that dose. Do not take double or extra doses. What may interact with this medicine? Do not take this medicine with any of the following medications:  cyclosporine  gemfibrozil  herbal medicines like red yeast rice This medicine may also interact with the following medications:  alcohol  antiviral medicines for HIV or AIDS  erythromycin  other medicines for cholesterol  rifampin  warfarin This list may not describe all possible interactions. Give your health care provider a list of all the medicines, herbs, non-prescription drugs, or dietary supplements you use. Also tell them if you smoke, drink alcohol, or use illegal drugs. Some items may interact with your medicine. What should I watch for while using this medicine? Visit your doctor or health care professional for regular check-ups. You may need regular tests to make sure your liver is working properly. Your health care professional may tell you to stop taking this medicine if you develop muscle problems. If your muscle problems do not go away after stopping this medicine, contact your health care professional. Do not become pregnant while taking this medicine. Women should inform their health care professional if they wish to become pregnant or think they might be pregnant. There is a potential for serious side effects to an unborn child. Talk to your health care professional or pharmacist for more information. Do not breast-feed an infant while taking this medicine. This medicine may increase blood sugar. Ask your healthcare provider if changes in diet or medicines are needed if you have diabetes. If you are going to need surgery or other procedure, tell your doctor that you are using this medicine. This drug is only part of a total heart-health program. Your doctor or a dietician can suggest a low-cholesterol and low-fat diet to help. Avoid alcohol and smoking, and keep a proper exercise schedule. This medicine may cause a  decrease in Co-Enzyme Q-10. You should make sure that you get enough Co-Enzyme Q-10 while you are taking this medicine. Discuss the foods you eat and the vitamins you take with your health care professional. What side effects may I notice from receiving this medicine? Side effects that you should report to your doctor or health care professional as soon as possible:  allergic reactions like skin rash, itching or hives, swelling of the face, lips, or tongue  confusion  joint pain  loss of memory  redness, blistering, peeling or loosening of the skin, including inside the mouth  signs and symptoms of high blood sugar such as being more thirsty or hungry or having to urinate more than normal. You may also feel very tired or have blurry vision.  signs and symptoms of muscle injury like dark urine; trouble passing urine or change in the amount of urine; unusually weak or tired; muscle pain or side or back pain  yellowing of the eyes or skin Side effects that usually do not require medical attention (report to your doctor or health care professional if they continue or are bothersome):  constipation  diarrhea  dizziness  gas  headache  nausea  stomach pain  trouble sleeping  upset stomach This list may not describe all possible side effects. Call your doctor for medical advice about side effects. You may report side effects to FDA at 1-800-FDA-1088. Where should I keep my medicine? Keep out of the reach of children. Store at room temperature between 15 and 30 degrees C (59 and 86 degrees F). Protect from light. Throw away any unused medicine after the expiration date. NOTE: This sheet is a summary. It may not cover all possible information. If you have questions about this medicine, talk to your doctor, pharmacist, or health care provider.  2021 Elsevier/Gold Standard (2018-02-21 08:15:37)  Co-Enzyme Q10 oral dosage forms What is this medicine? CO-ENZYME Q10 (koh EN zahym  Q10) is an herbal or dietary supplement. It is promoted to help many disorders including congestive heart failure, certain mitochondrial diseases, migraine, and high blood pressure. The FDA has not approved this supplement for any medical use. This supplement may be used for other purposes; ask your health care provider or pharmacist if you have questions. This medicine may be used for other purposes; ask your health care provider or pharmacist if you have questions. COMMON BRAND NAME(S): Co-Enzyme Q10, Q-Sorb Co Q-10, QuinZyme What should I tell my health care provider before I take this medicine? They need to know if you have any of these conditions:  an unusual or allergic reaction to co-enzyme Q10, other herbs, plants, medicines, foods, dyes, or preservatives  pregnant or trying to get pregnant  breast-feeding How should I use this medicine? Take this medicine by mouth with a glass of water. Follow the directions on the package labeling, or take as directed by your health care professional. You should take this medicine with a high-fat meal. Do not take this medicine more often than directed. Contact your pediatrician regarding the use of this medicine in children. Special care may be needed. Overdosage: If you think you have taken too much of this medicine contact a poison control center or emergency room at once. NOTE: This medicine is only for you. Do not share this medicine with others. What if I miss a dose? If you miss a dose, take it as soon as you can. If it is almost time for your next dose, take only that dose. Do not take double or extra doses. What may interact with this medicine?  medicines for blood pressure  warfarin This list may not describe all possible interactions. Give your health care provider a list of all the medicines, herbs, non-prescription drugs, or dietary supplements you use. Also tell them if you smoke, drink alcohol, or use illegal drugs. Some items may  interact with your medicine. What should I watch for while using this medicine? See your doctor if your symptoms do not get better or if they get worse. Herbal or dietary supplements are not regulated like medicines. Rigid quality control standards are not required for dietary supplements. The purity and strength of these products can vary. The safety and effect of this dietary supplement for a certain disease or illness is not well known. This product is not intended to diagnose, treat, cure or prevent any disease. The Food and Drug Administration suggests the following to help consumers protect themselves:  Always read product labels and follow directions.  Natural does not mean a product is safe for humans to take.  Look for products that include USP after the ingredient name.  This means that the manufacturer followed the standards of the [place] Pharmacopoeia.  Supplements made or sold by a nationally known food or drug company are more likely to be made under tight controls. You can write to the company for more information about how the product was made. What side effects may I notice from receiving this medicine? Side effects that you should report to your doctor or health care professional as soon as possible:  allergic reactions like skin rash, itching or hives, swelling of the face, lips, or tongue Side effects that usually do not require medical attention (report to your doctor or health care professional if they continue or are bothersome):  diarrhea  loss of appetite  nausea  trouble sleeping  upset stomach This list may not describe all possible side effects. Call your doctor for medical advice about side effects. You may report side effects to FDA at 1-800-FDA-1088. Where should I keep my medicine? Keep out of the reach of children. Store at room temperature or as directed on the package label. Throw away any unused medicine after the expiration date. NOTE: This sheet is a  summary. It may not cover all possible information. If you have questions about this medicine, talk to your doctor, pharmacist, or health care provider.  2021 Elsevier/Gold Standard (2015-06-02 16:28:05)

## 2020-10-14 LAB — HEPATIC FUNCTION PANEL
ALT: 17 IU/L (ref 0–32)
AST: 17 IU/L (ref 0–40)
Albumin: 4 g/dL (ref 3.8–4.8)
Alkaline Phosphatase: 74 IU/L (ref 44–121)
Bilirubin Total: 0.4 mg/dL (ref 0.0–1.2)
Bilirubin, Direct: 0.11 mg/dL (ref 0.00–0.40)
Total Protein: 6.5 g/dL (ref 6.0–8.5)

## 2020-10-14 LAB — BASIC METABOLIC PANEL
BUN/Creatinine Ratio: 15 (ref 12–28)
BUN: 12 mg/dL (ref 8–27)
CO2: 22 mmol/L (ref 20–29)
Calcium: 9 mg/dL (ref 8.7–10.3)
Chloride: 102 mmol/L (ref 96–106)
Creatinine, Ser: 0.82 mg/dL (ref 0.57–1.00)
Glucose: 101 mg/dL — ABNORMAL HIGH (ref 65–99)
Potassium: 4 mmol/L (ref 3.5–5.2)
Sodium: 138 mmol/L (ref 134–144)
eGFR: 77 mL/min/{1.73_m2} (ref >59)

## 2020-10-29 NOTE — Telephone Encounter (Signed)
Pt came to office and I assisted her with sending her strips. Dr. Geraldo Pitter reviewed and no changes were made.

## 2020-11-01 DIAGNOSIS — F332 Major depressive disorder, recurrent severe without psychotic features: Secondary | ICD-10-CM | POA: Diagnosis not present

## 2020-11-04 DIAGNOSIS — F5101 Primary insomnia: Secondary | ICD-10-CM | POA: Diagnosis not present

## 2020-11-04 DIAGNOSIS — F449 Dissociative and conversion disorder, unspecified: Secondary | ICD-10-CM | POA: Diagnosis not present

## 2020-11-04 DIAGNOSIS — Z79899 Other long term (current) drug therapy: Secondary | ICD-10-CM | POA: Diagnosis not present

## 2020-11-04 DIAGNOSIS — F330 Major depressive disorder, recurrent, mild: Secondary | ICD-10-CM | POA: Diagnosis not present

## 2020-11-04 DIAGNOSIS — F609 Personality disorder, unspecified: Secondary | ICD-10-CM | POA: Diagnosis not present

## 2020-11-04 DIAGNOSIS — F4322 Adjustment disorder with anxiety: Secondary | ICD-10-CM | POA: Diagnosis not present

## 2020-11-08 ENCOUNTER — Telehealth: Payer: Self-pay

## 2020-11-08 DIAGNOSIS — F332 Major depressive disorder, recurrent severe without psychotic features: Secondary | ICD-10-CM | POA: Diagnosis not present

## 2020-11-08 NOTE — Telephone Encounter (Signed)
Emailed billing department on 11/08/2020.

## 2020-11-08 NOTE — Telephone Encounter (Signed)
Pt has called back to talk about the no-show bill that she has just received. She did no-show video visit on 09/21/20 @ 10 am since her daughter in law past away the night before. She was then able to do a video visit later on that day at 3:45.   Pt wanted dispute the charge since she stated that she did call the office to inform us a someone told her not to worry about it.   I have informed pt that I would route the message to a manager to see if we can do anything about it.   Thanks.

## 2020-11-10 ENCOUNTER — Encounter: Payer: Self-pay | Admitting: Family Medicine

## 2020-11-10 ENCOUNTER — Other Ambulatory Visit: Payer: Self-pay | Admitting: Family Medicine

## 2020-11-10 ENCOUNTER — Other Ambulatory Visit: Payer: Self-pay

## 2020-11-10 ENCOUNTER — Ambulatory Visit (INDEPENDENT_AMBULATORY_CARE_PROVIDER_SITE_OTHER): Payer: PPO | Admitting: Family Medicine

## 2020-11-10 VITALS — BP 118/64 | HR 51 | Temp 97.9°F | Ht 66.5 in | Wt 142.1 lb

## 2020-11-10 DIAGNOSIS — J4540 Moderate persistent asthma, uncomplicated: Secondary | ICD-10-CM

## 2020-11-10 DIAGNOSIS — E079 Disorder of thyroid, unspecified: Secondary | ICD-10-CM

## 2020-11-10 DIAGNOSIS — T7840XD Allergy, unspecified, subsequent encounter: Secondary | ICD-10-CM | POA: Diagnosis not present

## 2020-11-10 DIAGNOSIS — R300 Dysuria: Secondary | ICD-10-CM | POA: Diagnosis not present

## 2020-11-10 DIAGNOSIS — B009 Herpesviral infection, unspecified: Secondary | ICD-10-CM

## 2020-11-10 LAB — POC URINALSYSI DIPSTICK (AUTOMATED)
Bilirubin, UA: NEGATIVE
Blood, UA: NEGATIVE
Glucose, UA: NEGATIVE
Ketones, UA: NEGATIVE
Nitrite, UA: NEGATIVE
Protein, UA: NEGATIVE
Spec Grav, UA: 1.01 (ref 1.010–1.025)
Urobilinogen, UA: 0.2 E.U./dL
pH, UA: 6.5 (ref 5.0–8.0)

## 2020-11-10 MED ORDER — LEVOTHYROXINE SODIUM 150 MCG PO TABS: 225.0000 ug | ORAL_TABLET | Freq: Every day | ORAL | 2 refills | Status: DC

## 2020-11-10 MED ORDER — AZELASTINE HCL 0.1 % NA SOLN: 2.0000 | Freq: Two times a day (BID) | NASAL | 12 refills | Status: DC

## 2020-11-10 MED ORDER — ALBUTEROL SULFATE (2.5 MG/3ML) 0.083% IN NEBU: 2.5000 mg | INHALATION_SOLUTION | Freq: Four times a day (QID) | RESPIRATORY_TRACT | 0 refills | Status: DC | PRN

## 2020-11-10 MED ORDER — NITROFURANTOIN MONOHYD MACRO 100 MG PO CAPS: 100.0000 mg | ORAL_CAPSULE | Freq: Two times a day (BID) | ORAL | 0 refills | Status: DC

## 2020-11-10 MED ORDER — VALACYCLOVIR HCL 1 G PO TABS
ORAL_TABLET | ORAL | 2 refills | Status: DC
Start: 2020-11-10 — End: 2021-02-04

## 2020-11-10 MED ORDER — FLUTICASONE-SALMETEROL 250-50 MCG/ACT IN AEPB: INHALATION_SPRAY | RESPIRATORY_TRACT | 2 refills | Status: DC

## 2020-11-10 NOTE — Progress Notes (Signed)
Chief Complaint  Patient presents with   Follow-up   Dysuria    Jenna Campbell is a 70 y.o. female here for possible UTI.  Duration: 1 week. Symptoms: Dysuria, urinary frequency and urgency Denies: hematuria, urinary hesitancy, urinary retention, fever, nausea, vomiting, flank pain, vaginal discharge Hx of recurrent UTI? No Denies new sexual partners.  Hypothyroidism Patient presents for follow-up of hypothyroidism.  Reports compliance with medication- levothyroxine 225 mcg/d. Current symptoms include: denies fatigue, weight changes, heat/cold intolerance, bowel/skin changes or CVS symptom She believes her dose should be unchanged  Asthma Moderate persistent, takes Advair 250-50 mcg/d, sparing use of SABA. No coughing, wheezing, sob. No AE's.   Past Medical History:  Diagnosis Date   Acute bronchitis due to other specified organisms 04/21/2015   Acute tension-type headache 12/16/2015   Adjustment disorder with anxious mood 04/21/2015   Allergic asthma with acute exacerbation 09/22/2014   Allergic rhinitis due to pollen 07/23/2014   Allergic urticaria 05/13/2015   Allergy    Alopecia 10/30/2013   Angina pectoris ([place]) 07/29/2019   Arthritis 04/24/2016   Asthma    Atrophic vaginitis 10/30/2013   Basal cell carcinoma 04/21/2015   Bilateral impacted cerumen 04/21/2015   CAD (coronary artery disease) 10/28/2019   Cancer ([place])    skin cancer   Cervical radiculopathy 09/17/2015   Change in bowel function 06/14/2016   Chest pain in adult 05/28/2017   Chest tightness 07/29/2019   Chronic diarrhea 12/16/2015   Colon polyps    Cough 02/14/2016   Cystocele, midline 10/30/2013   Dermatitis 09/22/2014   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   Diverticulosis of large intestine 10/30/2013   Dupuytren's contracture of both hands 09/17/2019   Dyspareunia 07/23/2014   Dysuria 12/16/2015   Encounter for long-term (current) use of other medications 10/19/2012   Esophageal dysphagia 06/14/2016   Essential hypertension     Family history of pheochromocytoma 02/14/2016   Foot pain, bilateral 01/05/2018   Gastroesophageal reflux disease    Gastroesophageal reflux disease without esophagitis 05/13/2015   Generalized abdominal pain 12/16/2015   GERD (gastroesophageal reflux disease)    H/O: hysterectomy    Herpes simplex 08/17/2016   History of adenomatous polyp of colon 06/14/2016   History of blood transfusion 1970   Hx of migraines 05/15/2009   Hypothyroidism 10/30/2013   Hypotonic bladder 10/30/2013   Insomnia 10/11/2017   Localized edema 12/16/2015   Low back pain 09/17/2015   Lumbar paraspinal muscle spasm 04/21/2015   Major depression, recurrent ([place]) 07/14/2013   Major depressive disorder, recurrent, mild ([place]) 03/14/2017   Malaise and fatigue 04/21/2015   MCI (mild cognitive impairment) 08/18/2015   Memory difficulty 09/17/2015   Menopause 03/31/2014   Midline low back pain without sciatica 03/31/2014   Migraine    Mild intermittent extrinsic asthma 12/16/2015   Mild persistent asthma 05/13/2015   Mixed dyslipidemia 11/12/2019   Neck pain 09/17/2015   Neuropathy 09/22/2014   Nonintractable headache 11/02/2016   Other allergic rhinitis 05/13/2015   Palpitation 09/03/2017   Pedal edema 07/29/2019   Pelvic pain in female 02/17/2015   Personality disorder ([place]) 10/19/2012   Pharyngoesophageal dysphagia 04/21/2015   Plantar fasciitis 01/20/2016   Precordial chest pain 08/18/2015   Primary insomnia 10/11/2017   Prolapse of vaginal vault after hysterectomy 10/30/2013   Rectocele 10/30/2013   Seasonal allergies 10/15/2019   Senile purpura ([place]) 06/15/2020   Shin splint 04/21/2015   Shortness of breath 07/17/2016   Sinusitis, chronic 10/30/2013   Slow transit constipation 07/23/2014  Stable angina ([place]) 10/28/2019   Swelling of lower limb 05/17/2016   Tension-type headache, not intractable 09/17/2015   Thyroid disease    Tinnitus of both ears 10/11/2017   Unstable angina ([place]) 07/29/2019   Urethral stricture 10/30/2013   Urinary  incontinence    Urinary incontinence without sensory awareness 07/17/2016   Varicose veins of both lower extremities 04/24/2016   Venous insufficiency (chronic) (peripheral) 06/06/2016     BP 118/64   Pulse (!) 51   Temp 97.9 F (36.6 C) (Oral)   Ht 5' 6.5" (1.689 m)   Wt 142 lb 2 oz (64.5 kg)   SpO2 98%   BMI 22.60 kg/m  General: Awake, alert, appears stated age Heart: RRR Lungs: CTAB, normal respiratory effort, no accessory muscle usage Abd: BS+, soft, diffusely ttp, worse over suprapubic region, ND, no masses or organomegaly MSK: No CVA tenderness, neg Lloyd's sign Psych: Age appropriate judgment and insight  Thyroid disease - Plan: levothyroxine (SYNTHROID) 150 MCG tablet  Moderate persistent asthma without complication - Plan: fluticasone-salmeterol (ADVAIR DISKUS) 250-50 MCG/ACT AEPB, albuterol (PROVENTIL) (2.5 MG/3ML) 0.083% nebulizer solution  Dysuria - Plan: POCT Urinalysis Dipstick (Automated), Urine Culture, nitrofurantoin, macrocrystal-monohydrate, (MACROBID) 100 MG capsule  Allergy, subsequent encounter - Plan: azelastine (ASTELIN) 0.1 % nasal spray  Herpes simplex - Plan: valACYclovir (VALTREX) 1000 MG tablet  Chronic, stable. Cont Synthroid 225 mcg/d.  Chronic, stable. Cont Advair 250-50 mcg 1 puff bid. Cont SABA prn. Stay hydrated. Macrobid 100 mg bid for 5 d. Seek immediate care if pt starts to develop fevers, new/worsening symptoms, uncontrollable N/V. Cont Xyzal, INCS, Astelin spray. Does not do well w Singulair. Refer to allergy.  F/u in 6 mo for CPE or prn. . The patient voiced understanding and agreement to the plan.  Dewar, DO 11/10/20 1:27 PM

## 2020-11-10 NOTE — Patient Instructions (Addendum)
Keep the diet clean and stay active.  If you do not hear anything about your referral in the next 1-2 weeks, call our office and ask for an update.  Stay hydrated.   Warning signs/symptoms: Uncontrollable nausea/vomiting, fevers, worsening symptoms despite treatment, confusion.  Give [place] around 2 business days to get culture back to you.  Let us know if you need anything.

## 2020-11-11 LAB — URINE CULTURE
MICRO NUMBER:: 12064739
Result:: NO GROWTH
SPECIMEN QUALITY:: ADEQUATE

## 2020-11-12 ENCOUNTER — Telehealth: Payer: Self-pay

## 2020-11-12 ENCOUNTER — Other Ambulatory Visit: Payer: Self-pay | Admitting: Family Medicine

## 2020-11-12 MED ORDER — FOSFOMYCIN TROMETHAMINE 3 G PO PACK: 3.0000 g | PACK | Freq: Once | ORAL | 0 refills | Status: AC

## 2020-11-12 NOTE — Telephone Encounter (Signed)
Pt says her husband tested positive about an hour ago- his sxs stared Wednesday evening. As of right now she is without sxs besides some hoarseness. She asks for advise.   She is about to take an at home test.   CB number: [phone]

## 2020-11-12 NOTE — Telephone Encounter (Signed)
I would test tomorrow if she is not having symptoms, stay away from him and mask if she is nearby him. Ty.

## 2020-11-12 NOTE — Telephone Encounter (Signed)
Patient informed of PCP instructions. 

## 2020-11-18 ENCOUNTER — Encounter (HOSPITAL_BASED_OUTPATIENT_CLINIC_OR_DEPARTMENT_OTHER): Payer: Self-pay | Admitting: Emergency Medicine

## 2020-11-18 ENCOUNTER — Other Ambulatory Visit: Payer: Self-pay

## 2020-11-18 DIAGNOSIS — Z79890 Hormone replacement therapy (postmenopausal): Secondary | ICD-10-CM

## 2020-11-18 DIAGNOSIS — Z880 Allergy status to penicillin: Secondary | ICD-10-CM

## 2020-11-18 DIAGNOSIS — J301 Allergic rhinitis due to pollen: Secondary | ICD-10-CM | POA: Diagnosis present

## 2020-11-18 DIAGNOSIS — Z882 Allergy status to sulfonamides status: Secondary | ICD-10-CM

## 2020-11-18 DIAGNOSIS — Z7951 Long term (current) use of inhaled steroids: Secondary | ICD-10-CM

## 2020-11-18 DIAGNOSIS — Z888 Allergy status to other drugs, medicaments and biological substances status: Secondary | ICD-10-CM

## 2020-11-18 DIAGNOSIS — Z79899 Other long term (current) drug therapy: Secondary | ICD-10-CM

## 2020-11-18 DIAGNOSIS — E871 Hypo-osmolality and hyponatremia: Secondary | ICD-10-CM | POA: Diagnosis present

## 2020-11-18 DIAGNOSIS — Z8719 Personal history of other diseases of the digestive system: Secondary | ICD-10-CM

## 2020-11-18 DIAGNOSIS — R1110 Vomiting, unspecified: Secondary | ICD-10-CM | POA: Diagnosis not present

## 2020-11-18 DIAGNOSIS — U071 COVID-19: Secondary | ICD-10-CM | POA: Diagnosis not present

## 2020-11-18 DIAGNOSIS — K219 Gastro-esophageal reflux disease without esophagitis: Secondary | ICD-10-CM | POA: Diagnosis present

## 2020-11-18 DIAGNOSIS — Z7982 Long term (current) use of aspirin: Secondary | ICD-10-CM

## 2020-11-18 DIAGNOSIS — E860 Dehydration: Secondary | ICD-10-CM | POA: Diagnosis present

## 2020-11-18 DIAGNOSIS — Z881 Allergy status to other antibiotic agents status: Secondary | ICD-10-CM

## 2020-11-18 DIAGNOSIS — E782 Mixed hyperlipidemia: Secondary | ICD-10-CM | POA: Diagnosis present

## 2020-11-18 DIAGNOSIS — Z91040 Latex allergy status: Secondary | ICD-10-CM

## 2020-11-18 DIAGNOSIS — R112 Nausea with vomiting, unspecified: Secondary | ICD-10-CM | POA: Diagnosis not present

## 2020-11-18 DIAGNOSIS — R0602 Shortness of breath: Secondary | ICD-10-CM | POA: Diagnosis not present

## 2020-11-18 DIAGNOSIS — Z85828 Personal history of other malignant neoplasm of skin: Secondary | ICD-10-CM

## 2020-11-18 DIAGNOSIS — Z825 Family history of asthma and other chronic lower respiratory diseases: Secondary | ICD-10-CM

## 2020-11-18 DIAGNOSIS — R197 Diarrhea, unspecified: Secondary | ICD-10-CM | POA: Diagnosis not present

## 2020-11-18 DIAGNOSIS — E876 Hypokalemia: Secondary | ICD-10-CM | POA: Diagnosis present

## 2020-11-18 DIAGNOSIS — A0839 Other viral enteritis: Secondary | ICD-10-CM | POA: Diagnosis present

## 2020-11-18 DIAGNOSIS — I2510 Atherosclerotic heart disease of native coronary artery without angina pectoris: Secondary | ICD-10-CM | POA: Diagnosis present

## 2020-11-18 DIAGNOSIS — Z820 Family history of epilepsy and other diseases of the nervous system: Secondary | ICD-10-CM

## 2020-11-18 DIAGNOSIS — E039 Hypothyroidism, unspecified: Secondary | ICD-10-CM | POA: Diagnosis present

## 2020-11-18 DIAGNOSIS — J4530 Mild persistent asthma, uncomplicated: Secondary | ICD-10-CM | POA: Diagnosis present

## 2020-11-18 NOTE — ED Triage Notes (Signed)
Pt took home test positive for Covid and husband has Covid. Pt to ED due to vomiting and diarrhea.

## 2020-11-18 NOTE — Telephone Encounter (Signed)
Spoke with pt. Temp is holding at 99 and I had her get her pulse ox out as her breathing sounded poor. She was at 52- I spoke with Maudie Mercury B - and then advised the pt that if it were close to or below 92 to call us back if it was before 5 and if after 5 pm I provided her with locations where she could seek care. She is scheduled for an 830 VV tomorrow.

## 2020-11-19 ENCOUNTER — Inpatient Hospital Stay (HOSPITAL_BASED_OUTPATIENT_CLINIC_OR_DEPARTMENT_OTHER)
Admission: EM | Admit: 2020-11-19 | Discharge: 2020-11-22 | DRG: 178 | Disposition: A | Discharge status: Home / Self Care | Payer: PPO | Attending: Internal Medicine | Admitting: Internal Medicine

## 2020-11-19 ENCOUNTER — Emergency Department (HOSPITAL_BASED_OUTPATIENT_CLINIC_OR_DEPARTMENT_OTHER): Payer: PPO

## 2020-11-19 ENCOUNTER — Encounter (HOSPITAL_COMMUNITY): Payer: Self-pay | Admitting: Internal Medicine

## 2020-11-19 ENCOUNTER — Telehealth: Payer: PPO | Admitting: Family Medicine

## 2020-11-19 DIAGNOSIS — R197 Diarrhea, unspecified: Secondary | ICD-10-CM

## 2020-11-19 DIAGNOSIS — E871 Hypo-osmolality and hyponatremia: Secondary | ICD-10-CM | POA: Diagnosis present

## 2020-11-19 DIAGNOSIS — Z881 Allergy status to other antibiotic agents status: Secondary | ICD-10-CM | POA: Diagnosis not present

## 2020-11-19 DIAGNOSIS — U071 COVID-19: Secondary | ICD-10-CM

## 2020-11-19 DIAGNOSIS — Z79899 Other long term (current) drug therapy: Secondary | ICD-10-CM | POA: Diagnosis not present

## 2020-11-19 DIAGNOSIS — K219 Gastro-esophageal reflux disease without esophagitis: Secondary | ICD-10-CM | POA: Diagnosis present

## 2020-11-19 DIAGNOSIS — E860 Dehydration: Secondary | ICD-10-CM | POA: Diagnosis not present

## 2020-11-19 DIAGNOSIS — Z79890 Hormone replacement therapy (postmenopausal): Secondary | ICD-10-CM | POA: Diagnosis not present

## 2020-11-19 DIAGNOSIS — I2510 Atherosclerotic heart disease of native coronary artery without angina pectoris: Secondary | ICD-10-CM | POA: Diagnosis not present

## 2020-11-19 DIAGNOSIS — R0602 Shortness of breath: Secondary | ICD-10-CM | POA: Diagnosis present

## 2020-11-19 DIAGNOSIS — Z85828 Personal history of other malignant neoplasm of skin: Secondary | ICD-10-CM | POA: Diagnosis not present

## 2020-11-19 DIAGNOSIS — Z825 Family history of asthma and other chronic lower respiratory diseases: Secondary | ICD-10-CM | POA: Diagnosis not present

## 2020-11-19 DIAGNOSIS — Z8719 Personal history of other diseases of the digestive system: Secondary | ICD-10-CM | POA: Diagnosis not present

## 2020-11-19 DIAGNOSIS — E876 Hypokalemia: Secondary | ICD-10-CM | POA: Diagnosis not present

## 2020-11-19 DIAGNOSIS — R1110 Vomiting, unspecified: Secondary | ICD-10-CM | POA: Diagnosis not present

## 2020-11-19 DIAGNOSIS — Z7951 Long term (current) use of inhaled steroids: Secondary | ICD-10-CM | POA: Diagnosis not present

## 2020-11-19 DIAGNOSIS — A0839 Other viral enteritis: Secondary | ICD-10-CM | POA: Diagnosis not present

## 2020-11-19 DIAGNOSIS — J4530 Mild persistent asthma, uncomplicated: Secondary | ICD-10-CM | POA: Diagnosis not present

## 2020-11-19 DIAGNOSIS — J301 Allergic rhinitis due to pollen: Secondary | ICD-10-CM | POA: Diagnosis not present

## 2020-11-19 DIAGNOSIS — Z91040 Latex allergy status: Secondary | ICD-10-CM | POA: Diagnosis not present

## 2020-11-19 DIAGNOSIS — E039 Hypothyroidism, unspecified: Secondary | ICD-10-CM | POA: Diagnosis not present

## 2020-11-19 DIAGNOSIS — Z888 Allergy status to other drugs, medicaments and biological substances status: Secondary | ICD-10-CM | POA: Diagnosis not present

## 2020-11-19 DIAGNOSIS — Z880 Allergy status to penicillin: Secondary | ICD-10-CM | POA: Diagnosis not present

## 2020-11-19 DIAGNOSIS — Z820 Family history of epilepsy and other diseases of the nervous system: Secondary | ICD-10-CM | POA: Diagnosis not present

## 2020-11-19 DIAGNOSIS — J3089 Other allergic rhinitis: Secondary | ICD-10-CM | POA: Diagnosis present

## 2020-11-19 DIAGNOSIS — Z7982 Long term (current) use of aspirin: Secondary | ICD-10-CM | POA: Diagnosis not present

## 2020-11-19 DIAGNOSIS — Z882 Allergy status to sulfonamides status: Secondary | ICD-10-CM | POA: Diagnosis not present

## 2020-11-19 DIAGNOSIS — E782 Mixed hyperlipidemia: Secondary | ICD-10-CM | POA: Diagnosis not present

## 2020-11-19 DIAGNOSIS — R112 Nausea with vomiting, unspecified: Secondary | ICD-10-CM | POA: Diagnosis not present

## 2020-11-19 HISTORY — DX: Nausea with vomiting, unspecified: R11.2

## 2020-11-19 HISTORY — DX: Diarrhea, unspecified: R19.7

## 2020-11-19 HISTORY — DX: Hypo-osmolality and hyponatremia: E87.1

## 2020-11-19 HISTORY — DX: Shortness of breath: R06.02

## 2020-11-19 HISTORY — DX: COVID-19: U07.1

## 2020-11-19 LAB — CBC WITH DIFFERENTIAL/PLATELET
Abs Immature Granulocytes: 0.03 10*3/uL (ref 0.00–0.07)
Basophils Absolute: 0 10*3/uL (ref 0.0–0.1)
Basophils Relative: 0 %
Eosinophils Absolute: 0 10*3/uL (ref 0.0–0.5)
Eosinophils Relative: 0 %
HCT: 36.2 % (ref 36.0–46.0)
Hemoglobin: 12.8 g/dL (ref 12.0–15.0)
Immature Granulocytes: 1 %
Lymphocytes Relative: 8 %
Lymphs Abs: 0.4 10*3/uL — ABNORMAL LOW (ref 0.7–4.0)
MCH: 31.1 pg (ref 26.0–34.0)
MCHC: 35.4 g/dL (ref 30.0–36.0)
MCV: 88.1 fL (ref 80.0–100.0)
Monocytes Absolute: 1 10*3/uL (ref 0.1–1.0)
Monocytes Relative: 18 %
Neutro Abs: 3.9 10*3/uL (ref 1.7–7.7)
Neutrophils Relative %: 73 %
Platelets: 200 10*3/uL (ref 150–400)
RBC: 4.11 MIL/uL (ref 3.87–5.11)
RDW: 12.3 % (ref 11.5–15.5)
WBC: 5.4 10*3/uL (ref 4.0–10.5)
nRBC: 0 % (ref 0.0–0.2)

## 2020-11-19 LAB — COMPREHENSIVE METABOLIC PANEL
ALT: 15 U/L (ref 0–44)
ALT: 18 U/L (ref 0–44)
AST: 21 U/L (ref 15–41)
AST: 25 U/L (ref 15–41)
Albumin: 3.1 g/dL — ABNORMAL LOW (ref 3.5–5.0)
Albumin: 4.1 g/dL (ref 3.5–5.0)
Alkaline Phosphatase: 52 U/L (ref 38–126)
Alkaline Phosphatase: 58 U/L (ref 38–126)
Anion gap: 7 (ref 5–15)
Anion gap: 7 (ref 5–15)
BUN: 7 mg/dL — ABNORMAL LOW (ref 8–23)
BUN: 8 mg/dL (ref 8–23)
CO2: 20 mmol/L — ABNORMAL LOW (ref 22–32)
CO2: 23 mmol/L (ref 22–32)
Calcium: 8.3 mg/dL — ABNORMAL LOW (ref 8.9–10.3)
Calcium: 8.3 mg/dL — ABNORMAL LOW (ref 8.9–10.3)
Chloride: 104 mmol/L (ref 98–111)
Chloride: 97 mmol/L — ABNORMAL LOW (ref 98–111)
Creatinine, Ser: 0.6 mg/dL (ref 0.44–1.00)
Creatinine, Ser: 0.68 mg/dL (ref 0.44–1.00)
GFR, Estimated: >60 mL/min (ref >60)
GFR, Estimated: >60 mL/min (ref >60)
Glucose, Bld: 137 mg/dL — ABNORMAL HIGH (ref 70–99)
Glucose, Bld: 86 mg/dL (ref 70–99)
Potassium: 3.5 mmol/L (ref 3.5–5.1)
Potassium: 3.5 mmol/L (ref 3.5–5.1)
Sodium: 127 mmol/L — ABNORMAL LOW (ref 135–145)
Sodium: 131 mmol/L — ABNORMAL LOW (ref 135–145)
Total Bilirubin: 0.4 mg/dL (ref 0.3–1.2)
Total Bilirubin: 0.6 mg/dL (ref 0.3–1.2)
Total Protein: 5.5 g/dL — ABNORMAL LOW (ref 6.5–8.1)
Total Protein: 7 g/dL (ref 6.5–8.1)

## 2020-11-19 LAB — LACTATE DEHYDROGENASE: LDH: 204 U/L — ABNORMAL HIGH (ref 98–192)

## 2020-11-19 LAB — MAGNESIUM: Magnesium: 2 mg/dL (ref 1.7–2.4)

## 2020-11-19 LAB — CBC
HCT: 36.2 % (ref 36.0–46.0)
Hemoglobin: 13.2 g/dL (ref 12.0–15.0)
MCH: 31.1 pg (ref 26.0–34.0)
MCHC: 36.5 g/dL — ABNORMAL HIGH (ref 30.0–36.0)
MCV: 85.4 fL (ref 80.0–100.0)
Platelets: 120 10*3/uL — ABNORMAL LOW (ref 150–400)
RBC: 4.24 MIL/uL (ref 3.87–5.11)
RDW: 12.2 % (ref 11.5–15.5)
WBC: 3 10*3/uL — ABNORMAL LOW (ref 4.0–10.5)
nRBC: 0 % (ref 0.0–0.2)

## 2020-11-19 LAB — URINALYSIS, ROUTINE W REFLEX MICROSCOPIC
Bilirubin Urine: NEGATIVE
Glucose, UA: NEGATIVE mg/dL
Hgb urine dipstick: NEGATIVE
Ketones, ur: NEGATIVE mg/dL
Nitrite: NEGATIVE
Protein, ur: NEGATIVE mg/dL
Specific Gravity, Urine: 1.01 (ref 1.005–1.030)
pH: 7.5 (ref 5.0–8.0)

## 2020-11-19 LAB — OSMOLALITY: Osmolality: 276 mOsm/kg (ref 275–295)

## 2020-11-19 LAB — FERRITIN: Ferritin: 60 ng/mL (ref 11–307)

## 2020-11-19 LAB — TSH: TSH: 1.448 u[IU]/mL (ref 0.350–4.500)

## 2020-11-19 LAB — RESP PANEL BY RT-PCR (FLU A&B, COVID) ARPGX2
Influenza A by PCR: NEGATIVE
Influenza B by PCR: NEGATIVE
SARS Coronavirus 2 by RT PCR: POSITIVE — AB

## 2020-11-19 LAB — URINALYSIS, MICROSCOPIC (REFLEX)

## 2020-11-19 LAB — FIBRINOGEN: Fibrinogen: 238 mg/dL (ref 210–475)

## 2020-11-19 LAB — URIC ACID: Uric Acid, Serum: 3.8 mg/dL (ref 2.5–7.1)

## 2020-11-19 LAB — C-REACTIVE PROTEIN: CRP: <0.5 mg/dL (ref <1.0)

## 2020-11-19 LAB — D-DIMER, QUANTITATIVE: D-Dimer, Quant: 0.61 ug/mL-FEU — ABNORMAL HIGH (ref 0.00–0.50)

## 2020-11-19 MED ORDER — ENSURE ENLIVE PO LIQD
237.0000 mL | Freq: Two times a day (BID) | ORAL | Status: DC
  Administered 2020-11-20 – 2020-11-22 (×5): 237 mL via ORAL

## 2020-11-19 MED ORDER — PANTOPRAZOLE SODIUM 40 MG IV SOLR
40.0000 mg | Freq: Once | INTRAVENOUS | Status: AC
  Administered 2020-11-19: 40 mg via INTRAVENOUS
  Filled 2020-11-19: qty 40

## 2020-11-19 MED ORDER — PROCHLORPERAZINE EDISYLATE 10 MG/2ML IJ SOLN
10.0000 mg | Freq: Once | INTRAMUSCULAR | Status: AC
  Administered 2020-11-19: 10 mg via INTRAVENOUS
  Filled 2020-11-19: qty 2

## 2020-11-19 MED ORDER — TEMAZEPAM 15 MG PO CAPS
15.0000 mg | ORAL_CAPSULE | Freq: Every evening | ORAL | Status: DC | PRN
  Administered 2020-11-20 – 2020-11-21 (×2): 15 mg via ORAL
  Filled 2020-11-19 (×2): qty 1

## 2020-11-19 MED ORDER — DIPHENHYDRAMINE HCL 50 MG/ML IJ SOLN
25.0000 mg | Freq: Once | INTRAMUSCULAR | Status: AC
  Administered 2020-11-19: 25 mg via INTRAVENOUS
  Filled 2020-11-19: qty 1

## 2020-11-19 MED ORDER — ONDANSETRON HCL 4 MG/2ML IJ SOLN
4.0000 mg | Freq: Once | INTRAMUSCULAR | Status: AC
  Administered 2020-11-19: 4 mg via INTRAVENOUS
  Filled 2020-11-19: qty 2

## 2020-11-19 MED ORDER — ACETAMINOPHEN 650 MG RE SUPP: 650.0000 mg | Freq: Four times a day (QID) | RECTAL | Status: DC | PRN

## 2020-11-19 MED ORDER — FLUTICASONE PROPIONATE 50 MCG/ACT NA SUSP
2.0000 | Freq: Every day | NASAL | Status: DC
  Administered 2020-11-20 – 2020-11-22 (×3): 2 via NASAL
  Filled 2020-11-19: qty 16

## 2020-11-19 MED ORDER — ASPIRIN 81 MG PO CHEW
81.0000 mg | CHEWABLE_TABLET | Freq: Every day | ORAL | Status: DC
  Administered 2020-11-19 – 2020-11-22 (×4): 81 mg via ORAL
  Filled 2020-11-19 (×4): qty 1

## 2020-11-19 MED ORDER — SODIUM CHLORIDE 0.9 % IV BOLUS
500.0000 mL | Freq: Once | INTRAVENOUS | Status: AC
  Administered 2020-11-19: 500 mL via INTRAVENOUS

## 2020-11-19 MED ORDER — TOPIRAMATE 25 MG PO TABS
100.0000 mg | ORAL_TABLET | Freq: Every day | ORAL | Status: DC
  Administered 2020-11-19 – 2020-11-21 (×3): 100 mg via ORAL
  Filled 2020-11-19 (×3): qty 4

## 2020-11-19 MED ORDER — SODIUM CHLORIDE 0.9 % IV SOLN
12.5000 mg | Freq: Four times a day (QID) | INTRAVENOUS | Status: DC | PRN
  Filled 2020-11-19: qty 0.5

## 2020-11-19 MED ORDER — ONDANSETRON HCL 4 MG/2ML IJ SOLN
4.0000 mg | Freq: Four times a day (QID) | INTRAMUSCULAR | Status: DC | PRN
  Administered 2020-11-22: 4 mg via INTRAVENOUS
  Filled 2020-11-19: qty 2

## 2020-11-19 MED ORDER — ALBUTEROL SULFATE HFA 108 (90 BASE) MCG/ACT IN AERS
1.0000 | INHALATION_SPRAY | RESPIRATORY_TRACT | Status: DC | PRN
Start: 2020-11-19 — End: 2020-11-22
  Administered 2020-11-21: 2 via RESPIRATORY_TRACT
  Filled 2020-11-19: qty 6.7

## 2020-11-19 MED ORDER — LACTATED RINGERS IV BOLUS
1000.0000 mL | Freq: Once | INTRAVENOUS | Status: AC
  Administered 2020-11-19: 1000 mL via INTRAVENOUS

## 2020-11-19 MED ORDER — PANTOPRAZOLE SODIUM 40 MG PO TBEC
40.0000 mg | DELAYED_RELEASE_TABLET | Freq: Two times a day (BID) | ORAL | Status: DC
  Administered 2020-11-19 – 2020-11-22 (×6): 40 mg via ORAL
  Filled 2020-11-19 (×6): qty 1

## 2020-11-19 MED ORDER — ACETAMINOPHEN 325 MG PO TABS
650.0000 mg | ORAL_TABLET | Freq: Four times a day (QID) | ORAL | Status: DC | PRN
  Administered 2020-11-19 – 2020-11-21 (×2): 650 mg via ORAL
  Filled 2020-11-19 (×2): qty 2

## 2020-11-19 MED ORDER — LORATADINE 10 MG PO TABS
10.0000 mg | ORAL_TABLET | Freq: Every day | ORAL | Status: DC
  Administered 2020-11-20 – 2020-11-22 (×3): 10 mg via ORAL
  Filled 2020-11-19 (×3): qty 1

## 2020-11-19 MED ORDER — KETOROLAC TROMETHAMINE 15 MG/ML IJ SOLN
15.0000 mg | Freq: Once | INTRAMUSCULAR | Status: AC
  Administered 2020-11-19: 15 mg via INTRAVENOUS
  Filled 2020-11-19: qty 1

## 2020-11-19 MED ORDER — METOCLOPRAMIDE HCL 5 MG/ML IJ SOLN
10.0000 mg | Freq: Once | INTRAMUSCULAR | Status: AC | PRN
  Administered 2020-11-19: 10 mg via INTRAVENOUS
  Filled 2020-11-19: qty 2

## 2020-11-19 MED ORDER — ONDANSETRON HCL 4 MG/2ML IJ SOLN
4.0000 mg | Freq: Once | INTRAMUSCULAR | Status: AC
Start: 2020-11-19 — End: 2020-11-19
  Administered 2020-11-19: 4 mg via INTRAVENOUS
  Filled 2020-11-19: qty 2

## 2020-11-19 MED ORDER — ALBUTEROL SULFATE HFA 108 (90 BASE) MCG/ACT IN AERS
2.0000 | INHALATION_SPRAY | Freq: Once | RESPIRATORY_TRACT | Status: AC
  Administered 2020-11-19: 2 via RESPIRATORY_TRACT
  Filled 2020-11-19: qty 6.7

## 2020-11-19 MED ORDER — SODIUM CHLORIDE 0.9 % IV SOLN
200.0000 mg | Freq: Once | INTRAVENOUS | Status: AC
  Administered 2020-11-19: 200 mg via INTRAVENOUS
  Filled 2020-11-19: qty 40

## 2020-11-19 MED ORDER — SODIUM CHLORIDE 0.9 % IV SOLN
100.0000 mg | Freq: Every day | INTRAVENOUS | Status: DC
  Administered 2020-11-20 – 2020-11-22 (×3): 100 mg via INTRAVENOUS
  Filled 2020-11-19 (×3): qty 20

## 2020-11-19 MED ORDER — GABAPENTIN 300 MG PO CAPS
300.0000 mg | ORAL_CAPSULE | Freq: Every morning | ORAL | Status: DC
  Administered 2020-11-20 – 2020-11-22 (×3): 300 mg via ORAL
  Filled 2020-11-19 (×3): qty 1

## 2020-11-19 MED ORDER — LEVOTHYROXINE SODIUM 75 MCG PO TABS
225.0000 ug | ORAL_TABLET | Freq: Every day | ORAL | Status: DC
  Administered 2020-11-20 – 2020-11-22 (×3): 225 ug via ORAL
  Filled 2020-11-19 (×3): qty 3

## 2020-11-19 MED ORDER — ACETAMINOPHEN 500 MG PO TABS
1000.0000 mg | ORAL_TABLET | Freq: Once | ORAL | Status: AC
  Administered 2020-11-19: 1000 mg via ORAL
  Filled 2020-11-19: qty 2

## 2020-11-19 MED ORDER — OLOPATADINE HCL 0.1 % OP SOLN
1.0000 [drp] | Freq: Two times a day (BID) | OPHTHALMIC | Status: DC
  Administered 2020-11-19 – 2020-11-22 (×6): 1 [drp] via OPHTHALMIC
  Filled 2020-11-19: qty 5

## 2020-11-19 MED ORDER — MOMETASONE FURO-FORMOTEROL FUM 200-5 MCG/ACT IN AERO
2.0000 | INHALATION_SPRAY | Freq: Two times a day (BID) | RESPIRATORY_TRACT | Status: DC
  Administered 2020-11-19 – 2020-11-22 (×6): 2 via RESPIRATORY_TRACT
  Filled 2020-11-19: qty 8.8

## 2020-11-19 NOTE — ED Notes (Signed)
Assisted pt up to the bedside commode  Voided large amount of clear yellow urine  Assisted back to bed  Tolerated well

## 2020-11-19 NOTE — ED Notes (Signed)
Pt's daughter called and informed pt. transferring.

## 2020-11-19 NOTE — ED Notes (Signed)
Report called to Sherlene Shams at [facility]

## 2020-11-19 NOTE — ED Notes (Signed)
Awake, alert, resting comfortably, Denies CP or SOB.

## 2020-11-19 NOTE — ED Notes (Signed)
Awake and alert. States headache has improved. Ate minimal amount of peanit butter crackers, appetite poor. Denies SOB.

## 2020-11-19 NOTE — ED Provider Notes (Signed)
[facility] Provider Note: Jenna Spurling, MD, FACEP  CSN: [id number] MRN: [id number] ARRIVAL: 11/18/20 at 2347 ROOM: [place]  Vomiting   HISTORY OF PRESENT ILLNESS  11/19/20 12:50 AM Jenna Campbell is a 70 y.o. female been having symptoms of illness 2 days ago.  Specifically she has had fever, chills, headache, body aches, sore throat, cough, shortness of breath, generalized weakness, nausea, vomiting and diarrhea.  She took a home COVID test which was positive (patient recently care for her husband who had COVID).  She is having abdominal cramping as well.  She rates her headache as a 10 out of 10, aching in quality, not improved by over-the-counter medications.  Her most concerning symptom is the vomiting which she states is persistent and worsened whenever she tries to drink fluids.   Past Medical History:  Diagnosis Date   Acute bronchitis due to other specified organisms 04/21/2015   Acute tension-type headache 12/16/2015   Adjustment disorder with anxious mood 04/21/2015   Allergic asthma with acute exacerbation 09/22/2014   Allergic rhinitis due to pollen 07/23/2014   Allergic urticaria 05/13/2015   Allergy    Alopecia 10/30/2013   Angina pectoris ([place]) 07/29/2019   Arthritis 04/24/2016   Asthma    Atrophic vaginitis 10/30/2013   Basal cell carcinoma 04/21/2015   Bilateral impacted cerumen 04/21/2015   CAD (coronary artery disease) 10/28/2019   Cancer ([place])    skin cancer   Cervical radiculopathy 09/17/2015   Change in bowel function 06/14/2016   Chest pain in adult 05/28/2017   Chest tightness 07/29/2019   Chronic diarrhea 12/16/2015   Colon polyps    Cough 02/14/2016   Cystocele, midline 10/30/2013   Dermatitis 09/22/2014   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   Diverticulosis of large intestine 10/30/2013   Dupuytren's contracture of both hands 09/17/2019   Dyspareunia 07/23/2014   Dysuria 12/16/2015   Encounter for long-term (current) use of other  medications 10/19/2012   Esophageal dysphagia 06/14/2016   Essential hypertension    Family history of pheochromocytoma 02/14/2016   Foot pain, bilateral 01/05/2018   Gastroesophageal reflux disease    Gastroesophageal reflux disease without esophagitis 05/13/2015   Generalized abdominal pain 12/16/2015   GERD (gastroesophageal reflux disease)    H/O: hysterectomy    Herpes simplex 08/17/2016   History of adenomatous polyp of colon 06/14/2016   History of blood transfusion 1970   Hx of migraines 05/15/2009   Hypothyroidism 10/30/2013   Hypotonic bladder 10/30/2013   Insomnia 10/11/2017   Localized edema 12/16/2015   Low back pain 09/17/2015   Lumbar paraspinal muscle spasm 04/21/2015   Major depression, recurrent ([place]) 07/14/2013   Major depressive disorder, recurrent, mild ([place]) 03/14/2017   Malaise and fatigue 04/21/2015   MCI (mild cognitive impairment) 08/18/2015   Memory difficulty 09/17/2015   Menopause 03/31/2014   Midline low back pain without sciatica 03/31/2014   Migraine    Mild intermittent extrinsic asthma 12/16/2015   Mild persistent asthma 05/13/2015   Mixed dyslipidemia 11/12/2019   Neck pain 09/17/2015   Neuropathy 09/22/2014   Nonintractable headache 11/02/2016   Other allergic rhinitis 05/13/2015   Palpitation 09/03/2017   Pedal edema 07/29/2019   Pelvic pain in female 02/17/2015   Personality disorder ([place]) 10/19/2012   Pharyngoesophageal dysphagia 04/21/2015   Plantar fasciitis 01/20/2016   Precordial chest pain 08/18/2015   Primary insomnia 10/11/2017   Prolapse of vaginal vault after hysterectomy 10/30/2013   Rectocele 10/30/2013   Seasonal allergies 10/15/2019  Senile purpura ([place]) 06/15/2020   Shin splint 04/21/2015   Shortness of breath 07/17/2016   Sinusitis, chronic 10/30/2013   Slow transit constipation 07/23/2014   Stable angina ([place]) 10/28/2019   Swelling of lower limb 05/17/2016   Tension-type headache, not intractable 09/17/2015   Thyroid disease    Tinnitus of both ears 10/11/2017    Unstable angina ([place]) 07/29/2019   Urethral stricture 10/30/2013   Urinary incontinence    Urinary incontinence without sensory awareness 07/17/2016   Varicose veins of both lower extremities 04/24/2016   Venous insufficiency (chronic) (peripheral) 06/06/2016    Past Surgical History:  Procedure Laterality Date   BREAST CYST ASPIRATION Left    25 years ago    COLONOSCOPY  04/2016   [facility] Diverticulitis and Multiple colon polyps   ESOPHAGOGASTRODUODENOSCOPY  04/2016   [facility]   INTRAVASCULAR PRESSURE WIRE/FFR STUDY N/A 10/17/2019   Procedure: INTRAVASCULAR PRESSURE WIRE/FFR STUDY;  Surgeon: Nelva Bush, MD;  Location: [facility];  Service: Cardiovascular;  Laterality: N/A;   LEFT HEART CATH AND CORONARY ANGIOGRAPHY N/A 10/17/2019   Procedure: LEFT HEART CATH AND CORONARY ANGIOGRAPHY;  Surgeon: Nelva Bush, MD;  Location: [facility];  Service: Cardiovascular;  Laterality: N/A;   MOHS SURGERY  10/05/2015   RECTOCELE REPAIR  2011   SKIN CANCER EXCISION  02/2015   Squamous cell removed from back   TONSILLECTOMY     VAGINAL HYSTERECTOMY     VAGINAL PROLAPSE REPAIR      Family History  Problem Relation Age of Onset   Asthma Father    Angina Father    Emphysema Father    Alzheimer's disease Mother    Cancer Sister        died of cancer   Colon cancer Neg Hx    Esophageal cancer Neg Hx     Social History   Tobacco Use   Smoking status: Never   Smokeless tobacco: Never  Vaping Use   Vaping Use: Never used  Substance Use Topics   Alcohol use: Not Currently   Drug use: No    Prior to Admission medications   Medication Sig Start Date End Date Taking? Authorizing Provider  albuterol (PROAIR HFA) 108 (90 Base) MCG/ACT inhaler Inhale 2 puffs into the lungs every 4 (four) hours as needed for wheezing or shortness of breath. 09/08/20   Nani Ravens, Crosby Oyster, DO  albuterol (PROVENTIL) (2.5 MG/3ML) 0.083% nebulizer solution INHALE 3 ML BY  NEBULIZATION EVERY 6 HOURS AS NEEDED FOR WHEEZING OR SHORTNESS OF BREATH 11/10/20   Shelda Pal, DO  ASPIRIN 81 PO Take 81 mg by mouth daily.     [provider]  azelastine (ASTELIN) 0.1 % nasal spray Place 2 sprays into both nostrils 2 (two) times daily. Use in each nostril as directed 11/10/20   Shelda Pal, DO  betamethasone valerate (VALISONE) 0.1 % cream Apply 1 application topically 2 (two) times daily.    [provider]  Coenzyme Q10 (CO Q-10) 200 MG CAPS Take 200 mg by mouth daily. 10/13/20   Revankar, Reita Cliche, MD  fluticasone (FLONASE) 50 MCG/ACT nasal spray Place 2 sprays into both nostrils daily. 04/30/20   Shelda Pal, DO  fluticasone-salmeterol (ADVAIR DISKUS) 250-50 MCG/ACT AEPB TAKE 1 PUFF BY MOUTH TWICE A DAY 11/10/20   Shelda Pal, DO  gabapentin (NEURONTIN) 300 MG capsule Take 300 mg by mouth in the morning. And take 300-600 mg at bedtime  [provider]  isosorbide mononitrate (IMDUR) 30 MG 24 hr tablet Take 0.5 tablets (15 mg total) by mouth daily. 10/17/19 10/16/20  End, Harrell Gave, MD  levocetirizine (XYZAL) 5 MG tablet Take 5 mg by mouth daily. 09/20/20   [provider]  levothyroxine (SYNTHROID) 150 MCG tablet Take 1.5 tablets (225 mcg total) by mouth daily before breakfast. 11/10/20   Wendling, Crosby Oyster, DO  LORazepam (ATIVAN) 0.5 MG tablet Take 0.5 mg by mouth every 8 (eight) hours as needed for anxiety.     [provider]  nitroGLYCERIN (NITROSTAT) 0.4 MG SL tablet Place 0.4 mg under the tongue every 5 (five) minutes as needed for chest pain.    [provider]  olopatadine (PATANOL) 0.1 % ophthalmic solution Place 1 drop into both eyes 2 (two) times daily. 09/01/19   Saguier, Percell Miller, PA-C  pantoprazole (PROTONIX) 40 MG tablet Take 1 tablet (40 mg total) by mouth 2 (two) times daily. 07/01/20   Cirigliano, Vito V, DO  Pitavastatin Calcium (LIVALO) 2 MG TABS Take 0.5 tablets  (1 mg total) by mouth daily. 10/13/20   Revankar, Reita Cliche, MD  temazepam (RESTORIL) 15 MG capsule Take 15 mg by mouth at bedtime.  07/28/19   [provider]  topiramate (TOPAMAX) 100 MG tablet Take 100 mg by mouth at bedtime.     [provider]  valACYclovir (VALTREX) 1000 MG tablet Take 1 tab daily for 5 days during outbreaks. 11/10/20   Shelda Pal, DO    Allergies Iodinated diagnostic agents, Kenalog [triamcinolone acetonide], Metrizamide, Other, Penicillins, Crestor [rosuvastatin], Lipitor [atorvastatin], Montelukast sodium, Xyzal [levocetirizine], Ciprofloxacin, Diazepam, Latex, and Sulfa antibiotics   REVIEW OF SYSTEMS  Negative except as noted here or in the History of Present Illness.   PHYSICAL EXAMINATION  Initial Vital Signs Blood pressure 136/65, pulse 63, temperature 98.1 F (36.7 C), temperature source Oral, resp. rate 16, height 5' 6.5" (1.689 m), weight 62.6 kg, SpO2 98 %.  Examination General: Well-developed, well-nourished female in no acute distress; appearance consistent with age of record HENT: normocephalic; atraumatic; no pharyngeal erythema or exudate Eyes: pupils equal, round and reactive to light; extraocular muscles intact Neck: supple Heart: regular rate and rhythm Lungs: Creased air movement bilaterally with shallow breaths Abdomen: soft; nondistended; mild, diffuse tenderness; bowel sounds present Extremities: No deformity; full range of motion; pulses normal Neurologic: Awake, alert and oriented; motor function intact in all extremities and symmetric; no facial droop Skin: Warm and dry Psychiatric: Flat affect   RESULTS  Summary of this visit's results, reviewed and interpreted by myself:   EKG Interpretation  Date/Time:    Ventricular Rate:    PR Interval:    QRS Duration:   QT Interval:    QTC Calculation:   R Axis:     Text Interpretation:          Laboratory Studies: Results for orders placed or  performed during the hospital encounter of 11/19/20 (from the past 24 hour(s))  CBC with Differential     Status: Abnormal   Collection Time: 11/19/20 12:20 AM  Result Value Ref Range   WBC 5.4 4.0 - 10.5 K/uL   RBC 4.11 3.87 - 5.11 MIL/uL   Hemoglobin 12.8 12.0 - 15.0 g/dL   HCT 36.2 36.0 - 46.0 %   MCV 88.1 80.0 - 100.0 fL   MCH 31.1 26.0 - 34.0 pg   MCHC 35.4 30.0 - 36.0 g/dL   RDW 12.3 11.5 - 15.5 %   Platelets  200 150 - 400 K/uL   nRBC 0.0 0.0 - 0.2 %   Neutrophils Relative % 73 %   Neutro Abs 3.9 1.7 - 7.7 K/uL   Lymphocytes Relative 8 %   Lymphs Abs 0.4 (L) 0.7 - 4.0 K/uL   Monocytes Relative 18 %   Monocytes Absolute 1.0 0.1 - 1.0 K/uL   Eosinophils Relative 0 %   Eosinophils Absolute 0.0 0.0 - 0.5 K/uL   Basophils Relative 0 %   Basophils Absolute 0.0 0.0 - 0.1 K/uL   Immature Granulocytes 1 %   Abs Immature Granulocytes 0.03 0.00 - 0.07 K/uL  Comprehensive metabolic panel     Status: Abnormal   Collection Time: 11/19/20 12:20 AM  Result Value Ref Range   Sodium 127 (L) 135 - 145 mmol/L   Potassium 3.5 3.5 - 5.1 mmol/L   Chloride 97 (L) 98 - 111 mmol/L   CO2 23 22 - 32 mmol/L   Glucose, Bld 137 (H) 70 - 99 mg/dL   BUN 8 8 - 23 mg/dL   Creatinine, Ser 0.60 0.44 - 1.00 mg/dL   Calcium 8.3 (L) 8.9 - 10.3 mg/dL   Total Protein 7.0 6.5 - 8.1 g/dL   Albumin 4.1 3.5 - 5.0 g/dL   AST 21 15 - 41 U/L   ALT 18 0 - 44 U/L   Alkaline Phosphatase 58 38 - 126 U/L   Total Bilirubin 0.4 0.3 - 1.2 mg/dL   GFR, Estimated >60 >60 mL/min   Anion gap 7 5 - 15  Resp Panel by RT-PCR (Flu A&B, Covid) Nasopharyngeal Swab     Status: Abnormal   Collection Time: 11/19/20  1:25 AM   Specimen: Nasopharyngeal Swab; Nasopharyngeal(NP) swabs in vial transport medium  Result Value Ref Range   SARS Coronavirus 2 by RT PCR POSITIVE (A) NEGATIVE   Influenza A by PCR NEGATIVE NEGATIVE   Influenza B by PCR NEGATIVE NEGATIVE  Urinalysis, Routine w reflex microscopic Urine, Clean Catch      Status: Abnormal   Collection Time: 11/19/20  2:26 AM  Result Value Ref Range   Color, Urine YELLOW YELLOW   APPearance CLEAR CLEAR   Specific Gravity, Urine 1.010 1.005 - 1.030   pH 7.5 5.0 - 8.0   Glucose, UA NEGATIVE NEGATIVE mg/dL   Hgb urine dipstick NEGATIVE NEGATIVE   Bilirubin Urine NEGATIVE NEGATIVE   Ketones, ur NEGATIVE NEGATIVE mg/dL   Protein, ur NEGATIVE NEGATIVE mg/dL   Nitrite NEGATIVE NEGATIVE   Leukocytes,Ua TRACE (A) NEGATIVE  Urinalysis, Microscopic (reflex)     Status: Abnormal   Collection Time: 11/19/20  2:26 AM  Result Value Ref Range   RBC / HPF 0-5 0 - 5 RBC/hpf   WBC, UA 6-10 0 - 5 WBC/hpf   Bacteria, UA MANY (A) NONE SEEN   Squamous Epithelial / LPF 0-5 0 - 5   Imaging Studies: DG Chest Port 1 View  Result Date: 11/19/2020 CLINICAL DATA:  Shortness of breath. COVID positive. Vomiting and diarrhea. EXAM: PORTABLE CHEST 1 VIEW COMPARISON:  05/11/2020 FINDINGS: The heart size and mediastinal contours are within normal limits. Both lungs are clear. The visualized skeletal structures are unremarkable. IMPRESSION: No active disease. Electronically Signed   By: Lucienne Capers M.D.   On: 11/19/2020 01:47    ED COURSE and MDM  Nursing notes, initial and subsequent vitals signs, including pulse oximetry, reviewed and interpreted by myself.  Vitals:   11/19/20 0115 11/19/20 0124 11/19/20 0200 11/19/20 0300  BP: Marland Kitchen)  114/95  (!) 152/136 134/72  Pulse: (!) 58  67 60  Resp: 16   18  Temp:      TempSrc:      SpO2: 96% 98% 94% 95%  Weight:      Height:       Medications  pantoprazole (PROTONIX) injection 40 mg (has no administration in time range)  sodium chloride 0.9 % bolus 500 mL (0 mLs Intravenous Stopped 11/19/20 0051)  ondansetron (ZOFRAN) injection 4 mg (4 mg Intravenous Given 11/19/20 0019)  lactated ringers bolus 1,000 mL ( Intravenous Stopped 11/19/20 0223)  metoCLOPramide (REGLAN) injection 10 mg (10 mg Intravenous Given 11/19/20 0111)  ketorolac  (TORADOL) 15 MG/ML injection 15 mg (15 mg Intravenous Given 11/19/20 0111)  albuterol (VENTOLIN HFA) 108 (90 Base) MCG/ACT inhaler 2 puff (2 puffs Inhalation Given 11/19/20 0118)   3:09 AM Patient states she is still nauseated despite IV fluids, Zofran and Reglan.  She states her stomach acid is burning her stomach and throat.  Dr. Hal Hope accepts for admission to the hospitalist service.  PROCEDURES  Procedures   ED DIAGNOSES     ICD-10-CM   1. COVID-19 virus infection  U07.1     2. Nausea, vomiting and diarrhea  R11.2    R19.7     3. Hyponatremia  E87.1          Kekoa Fyock, Jenny Reichmann, MD 11/19/20 [phone]

## 2020-11-19 NOTE — ED Notes (Signed)
Care link here to pick up pt  

## 2020-11-19 NOTE — H&P (Signed)
History and Physical    PLEASE NOTE THAT DRAGON DICTATION SOFTWARE WAS USED IN THE CONSTRUCTION OF THIS NOTE.   Jenna Campbell YQM:[id number] DOB: [birth year] DOA: 11/19/2020  PCP: Jenna Pal, DO Patient coming from: home   I have personally briefly reviewed patient's old medical records in [place]  Chief Complaint: N/V  HPI: Jenna Campbell is a 70 y.o. female with medical history significant for mild intermittent asthma, allergic rhinitis, chronic diarrhea, acquired hypothyroidism, migraines who is admitted to [facility] on 11/19/2020 by way of transfer from [place] emergency department with COVID-19 infection after presenting from home to [facility] ED complaining of nausea and vomiting.   Patient reports to 3 days of nausea resulting in 3-4 daily episodes of nonbloody, nonbilious emesis over that timeframe.  Denies any associated abdominal discomfort.  She reports 1-2 episodes of loose stool per day over that timeframe, but states that this is consistent with the frequency of her chronic diarrhea that she has been experiencing for multiple years.  Associate with any recent melena or hematochezia.  She notes associated shortness of breath, new onset nonproductive cough, subjective fever, chills, and generalized myalgias in the absence of full body rigors.  She notes that she has recently been taking care of her husband, who had similar symptoms starting approximately 1 week ago.  In the interval, he was tested positive for COVID-19.  She took a home COVID-19 test on 11/18/2020, which she reports was positive.  She reports her shortness of breath has not been associated with any orthopnea, PND, or new onset peripheral edema.  She denies any associated calf tenderness or new onset lower extremity erythema.  Not associate with any recent chest pain, diaphoresis, palpitations, dizziness, presyncope, or syncope.  Denies any associated wheezing, hemoptysis.  No recent trauma  or travel,.  Denies any periods of prolonged diminished ambulatory status.  Not associate with any recent neck stiffness or rash.  No recent acute urinary symptoms, including no recent dysuria, gross hematuria, or change in urinary urgency/frequency.  Over the last 2 3 days she is also noted intermittent headache symmetrical across the frontal lobe, which she describes as sharp in nature.  Denies any associated visual or olfactory aura.  She knowledges a history of migraines, for which she is on prophylactic Topamax, but states that the intermittent headache that she has been experiencing over the course the last few days is qualitatively different than what she is experienced with previous migraines.  Denies any associated acute focal weakness, acute focal numbness, paresthesias, acute change in vision, slurred speech, facial droop, vertigo, dysphagia.  Confirms a history of mild intermittent asthma, noting that her home respiratory regimen consists of scheduled Advair as well as as needed albuterol.  Conveys that she is a lifelong non-smoker.  Denies any history of underlying diabetes.  In the setting of progression of the above symptoms, and associated diminished ability to tolerate p.o. intake in the context of recurrent nausea/vomiting, the patient presented to [place] emergency department just after midnight on the morning of 11/19/2020.     [facility] ED Course:  Vital signs in the ED were notable for the following: Temperature max 99.4, heart rate 45 - 62; blood pressure 114/95 -160/79; respiratory rate 16-22, oxygen saturation 97 to 1% on room air.  Labs were notable for the following: CMP notable for the following: Sodium 127 relative to most recent prior value 138 on 10/13/2020, potassium 3.5, chloride 95, bicarbonate 23,  creatinine 0.60, glucose 137, and liver enzymes were found to be within normal limits.  CBC notable for white cell count of 5400, hemoglobin 12.8.  Urinalysis showed  6-10 white blood cells and was nitrate negative.  Nasopharyngeal COVID-19 PCR performed at [facility] ED and found to be positive, while negative for influenza A and B.  Chest x-ray showed no evidence of acute cardiopulmonary process.  While in [facility] ED, the following were administered: Lactated Ringer's x1 L bolus, normal saline x500 cc bolus, Tylenol 1 g p.o., albuterol inhaler x2 puffs, Toradol 15 mg IV x2, Zofran 4 mg IV x1, Compazine 10 mg IV x1, Reglan 10 mg IV x1, Protonix 40 mg IV x1.  Subsequently, the patient was transferred from [facility] ED to [facility] for further evaluation and management of [id number] infection complicated by refractory nausea/vomiting as well as acute hyponatremia.     Review of Systems: As per HPI otherwise 10 point review of systems negative.   Past Medical History:  Diagnosis Date   Acute bronchitis due to other specified organisms 04/21/2015   Acute tension-type headache 12/16/2015   Adjustment disorder with anxious mood 04/21/2015   Allergic asthma with acute exacerbation 09/22/2014   Allergic rhinitis due to pollen 07/23/2014   Allergic urticaria 05/13/2015   Allergy    Alopecia 10/30/2013   Angina pectoris ([place]) 07/29/2019   Arthritis 04/24/2016   Asthma    Atrophic vaginitis 10/30/2013   Basal cell carcinoma 04/21/2015   Bilateral impacted cerumen 04/21/2015   CAD (coronary artery disease) 10/28/2019   Cancer ([place])    skin cancer   Cervical radiculopathy 09/17/2015   Change in bowel function 06/14/2016   Chest pain in adult 05/28/2017   Chest tightness 07/29/2019   Chronic diarrhea 12/16/2015   Colon polyps    Cough 02/14/2016   Cystocele, midline 10/30/2013   Dermatitis 09/22/2014   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   Diverticulosis of large intestine 10/30/2013   Dupuytren's contracture of both hands 09/17/2019   Dyspareunia 07/23/2014   Dysuria 12/16/2015   Encounter for long-term (current) use of other medications 10/19/2012   Esophageal  dysphagia 06/14/2016   Essential hypertension    Family history of pheochromocytoma 02/14/2016   Foot pain, bilateral 01/05/2018   Gastroesophageal reflux disease    Gastroesophageal reflux disease without esophagitis 05/13/2015   Generalized abdominal pain 12/16/2015   GERD (gastroesophageal reflux disease)    H/O: hysterectomy    Herpes simplex 08/17/2016   History of adenomatous polyp of colon 06/14/2016   History of blood transfusion 1970   Hx of migraines 05/15/2009   Hypothyroidism 10/30/2013   Hypotonic bladder 10/30/2013   Insomnia 10/11/2017   Localized edema 12/16/2015   Low back pain 09/17/2015   Lumbar paraspinal muscle spasm 04/21/2015   Major depression, recurrent ([place]) 07/14/2013   Major depressive disorder, recurrent, mild ([place]) 03/14/2017   Malaise and fatigue 04/21/2015   MCI (mild cognitive impairment) 08/18/2015   Memory difficulty 09/17/2015   Menopause 03/31/2014   Midline low back pain without sciatica 03/31/2014   Migraine    Mild intermittent extrinsic asthma 12/16/2015   Mild persistent asthma 05/13/2015   Mixed dyslipidemia 11/12/2019   Neck pain 09/17/2015   Neuropathy 09/22/2014   Nonintractable headache 11/02/2016   Other allergic rhinitis 05/13/2015   Palpitation 09/03/2017   Pedal edema 07/29/2019   Pelvic pain in female 02/17/2015   Personality disorder ([place]) 10/19/2012   Pharyngoesophageal dysphagia 04/21/2015   Plantar fasciitis 01/20/2016  Precordial chest pain 08/18/2015   Primary insomnia 10/11/2017   Prolapse of vaginal vault after hysterectomy 10/30/2013   Rectocele 10/30/2013   Seasonal allergies 10/15/2019   Senile purpura ([place]) 06/15/2020   Shin splint 04/21/2015   Shortness of breath 07/17/2016   Sinusitis, chronic 10/30/2013   Slow transit constipation 07/23/2014   Stable angina (HCC) 10/28/2019   Swelling of lower limb 05/17/2016   Tension-type headache, not intractable 09/17/2015   Thyroid disease    Tinnitus of both ears 10/11/2017   Unstable angina ([place]) 07/29/2019    Urethral stricture 10/30/2013   Urinary incontinence    Urinary incontinence without sensory awareness 07/17/2016   Varicose veins of both lower extremities 04/24/2016   Venous insufficiency (chronic) (peripheral) 06/06/2016    Past Surgical History:  Procedure Laterality Date   BREAST CYST ASPIRATION Left    25 years ago    COLONOSCOPY  04/2016   [facility] Diverticulitis and Multiple colon polyps   ESOPHAGOGASTRODUODENOSCOPY  04/2016   [facility]   INTRAVASCULAR PRESSURE WIRE/FFR STUDY N/A 10/17/2019   Procedure: INTRAVASCULAR PRESSURE WIRE/FFR STUDY;  Surgeon: Nelva Bush, MD;  Location: [place] [facility];  Service: Cardiovascular;  Laterality: N/A;   LEFT HEART CATH AND CORONARY ANGIOGRAPHY N/A 10/17/2019   Procedure: LEFT HEART CATH AND CORONARY ANGIOGRAPHY;  Surgeon: Nelva Bush, MD;  Location: [place] [facility];  Service: Cardiovascular;  Laterality: N/A;   MOHS SURGERY  10/05/2015   RECTOCELE REPAIR  2011   SKIN CANCER EXCISION  02/2015   Squamous cell removed from back   TONSILLECTOMY     VAGINAL HYSTERECTOMY     VAGINAL PROLAPSE REPAIR      Social History:  reports that she has never smoked. She has never used smokeless tobacco. She reports previous alcohol use. She reports that she does not use drugs.   Allergies  Allergen Reactions   Iodinated Diagnostic Agents Itching, Other (See Comments), Rash and Shortness Of Breath    Throat Swelling, Erythema   Kenalog [Triamcinolone Acetonide] Swelling   Metrizamide Itching, Other (See Comments), Rash and Shortness Of Breath    Throat Swelling, Erythema   Other Itching and Swelling    DUST   Penicillins Anaphylaxis and Itching   Crestor [Rosuvastatin] Other (See Comments)    myalgia   Lipitor [Atorvastatin] Other (See Comments)    myalgia   Montelukast Sodium Other (See Comments)    Chest pain, dizziness and lightheadedness   Xyzal [Levocetirizine]     Chest pressure   Ciprofloxacin Itching   Diazepam  Rash   Latex Itching, Rash and Swelling   Sulfa Antibiotics Rash    Family History  Problem Relation Age of Onset   Asthma Father    Angina Father    Emphysema Father    Alzheimer's disease Mother    Cancer Sister        died of cancer   Colon cancer Neg Hx    Esophageal cancer Neg Hx     Family history reviewed and not pertinent    Prior to Admission medications   Medication Sig Start Date End Date Taking? Authorizing Provider  albuterol (PROAIR HFA) 108 (90 Base) MCG/ACT inhaler Inhale 2 puffs into the lungs every 4 (four) hours as needed for wheezing or shortness of breath. 09/08/20   Nani Ravens, Crosby Oyster, DO  albuterol (PROVENTIL) (2.5 MG/3ML) 0.083% nebulizer solution INHALE 3 ML BY NEBULIZATION EVERY 6 HOURS AS NEEDED FOR WHEEZING OR SHORTNESS OF BREATH 11/10/20  Jenna Pal, DO  ASPIRIN 81 PO Take 81 mg by mouth daily.     [provider]  azelastine (ASTELIN) 0.1 % nasal spray Place 2 sprays into both nostrils 2 (two) times daily. Use in each nostril as directed 11/10/20   Jenna Pal, DO  betamethasone valerate (VALISONE) 0.1 % cream Apply 1 application topically 2 (two) times daily.    [provider]  Coenzyme Q10 (CO Q-10) 200 MG CAPS Take 200 mg by mouth daily. 10/13/20   Revankar, Reita Cliche, MD  fluticasone (FLONASE) 50 MCG/ACT nasal spray Place 2 sprays into both nostrils daily. 04/30/20   Jenna Pal, DO  fluticasone-salmeterol (ADVAIR DISKUS) 250-50 MCG/ACT AEPB TAKE 1 PUFF BY MOUTH TWICE A DAY 11/10/20   Jenna Pal, DO  gabapentin (NEURONTIN) 300 MG capsule Take 300 mg by mouth in the morning. And take 300-600 mg at bedtime    [provider]  isosorbide mononitrate (IMDUR) 30 MG 24 hr tablet Take 0.5 tablets (15 mg total) by mouth daily. 10/17/19 10/16/20  End, Harrell Gave, MD  levocetirizine (XYZAL) 5 MG tablet Take 5 mg by mouth daily. 09/20/20   [provider]  levothyroxine (SYNTHROID)  150 MCG tablet Take 1.5 tablets (225 mcg total) by mouth daily before breakfast. 11/10/20   Wendling, Crosby Oyster, DO  LORazepam (ATIVAN) 0.5 MG tablet Take 0.5 mg by mouth every 8 (eight) hours as needed for anxiety.     [provider]  nitroGLYCERIN (NITROSTAT) 0.4 MG SL tablet Place 0.4 mg under the tongue every 5 (five) minutes as needed for chest pain.    [provider]  olopatadine (PATANOL) 0.1 % ophthalmic solution Place 1 drop into both eyes 2 (two) times daily. 09/01/19   Saguier, Percell Miller, PA-C  pantoprazole (PROTONIX) 40 MG tablet Take 1 tablet (40 mg total) by mouth 2 (two) times daily. 07/01/20   Cirigliano, Vito V, DO  Pitavastatin Calcium (LIVALO) 2 MG TABS Take 0.5 tablets (1 mg total) by mouth daily. 10/13/20   Revankar, Reita Cliche, MD  temazepam (RESTORIL) 15 MG capsule Take 15 mg by mouth at bedtime.  07/28/19   [provider]  topiramate (TOPAMAX) 100 MG tablet Take 100 mg by mouth at bedtime.     [provider]  valACYclovir (VALTREX) 1000 MG tablet Take 1 tab daily for 5 days during outbreaks. 11/10/20   Jenna Pal, DO     Objective    Physical Exam: Vitals:   11/19/20 1200 11/19/20 1423 11/19/20 1700 11/19/20 1858  BP: 136/67 (!) 121/99 (!) 145/56 (!) 155/69  Pulse: (!) 48 (!) 45 (!) 46   Resp: $Remo'14 15 16 17  'haxEs$ Temp:  99.4 F (37.4 C)  98.3 F (36.8 C)  TempSrc:  Oral  Oral  SpO2: 96% 98% 97% 99%  Weight:      Height:        General: appears to be stated age; alert, oriented Skin: warm, dry, no rash Head:  AT/[place] Mouth:  Oral mucosa membranes appear dry, normal dentition Neck: supple; trachea midline Heart:  RRR; did not appreciate any M/R/G Lungs: CTAB, did not appreciate any wheezes, rales, or rhonchi Abdomen: + BS; soft, ND, NT Vascular: 2+ pedal pulses b/l; 2+ radial pulses b/l Extremities: no peripheral edema, no muscle wasting Neuro: strength and sensation intact in upper and lower extremities  b/l     Labs on Admission: I have personally reviewed following labs and imaging studies  CBC: Recent  Labs  Lab 11/19/20 0020  WBC 5.4  NEUTROABS 3.9  HGB 12.8  HCT 36.2  MCV 88.1  PLT 277   Basic Metabolic Panel: Recent Labs  Lab 11/19/20 0020  NA 127*  K 3.5  CL 97*  CO2 23  GLUCOSE 137*  BUN 8  CREATININE 0.60  CALCIUM 8.3*   GFR: Estimated Creatinine Clearance: 62.5 mL/min (by C-G formula based on SCr of 0.6 mg/dL). Liver Function Tests: Recent Labs  Lab 11/19/20 0020  AST 21  ALT 18  ALKPHOS 58  BILITOT 0.4  PROT 7.0  ALBUMIN 4.1   No results for input(s): LIPASE, AMYLASE in the last 168 hours. No results for input(s): AMMONIA in the last 168 hours. Coagulation Profile: No results for input(s): INR, PROTIME in the last 168 hours. Cardiac Enzymes: No results for input(s): CKTOTAL, CKMB, CKMBINDEX, TROPONINI in the last 168 hours. BNP (last 3 results) No results for input(s): PROBNP in the last 8760 hours. HbA1C: No results for input(s): HGBA1C in the last 72 hours. CBG: No results for input(s): GLUCAP in the last 168 hours. Lipid Profile: No results for input(s): CHOL, HDL, LDLCALC, TRIG, CHOLHDL, LDLDIRECT in the last 72 hours. Thyroid Function Tests: No results for input(s): TSH, T4TOTAL, FREET4, T3FREE, THYROIDAB in the last 72 hours. Anemia Panel: No results for input(s): VITAMINB12, FOLATE, FERRITIN, TIBC, IRON, RETICCTPCT in the last 72 hours. Urine analysis:    Component Value Date/Time   COLORURINE YELLOW 11/19/2020 0226   APPEARANCEUR CLEAR 11/19/2020 0226   LABSPEC 1.010 11/19/2020 0226   PHURINE 7.5 11/19/2020 0226   GLUCOSEU NEGATIVE 11/19/2020 0226   HGBUR NEGATIVE 11/19/2020 0226   BILIRUBINUR NEGATIVE 11/19/2020 0226   BILIRUBINUR negative 11/10/2020 1311   KETONESUR NEGATIVE 11/19/2020 0226   PROTEINUR NEGATIVE 11/19/2020 0226   UROBILINOGEN 0.2 11/10/2020 1311   NITRITE NEGATIVE 11/19/2020 0226   LEUKOCYTESUR TRACE (A)  11/19/2020 0226    Radiological Exams on Admission: DG Chest Port 1 View  Result Date: 11/19/2020 CLINICAL DATA:  Shortness of breath. COVID positive. Vomiting and diarrhea. EXAM: PORTABLE CHEST 1 VIEW COMPARISON:  05/11/2020 FINDINGS: The heart size and mediastinal contours are within normal limits. Both lungs are clear. The visualized skeletal structures are unremarkable. IMPRESSION: No active disease. Electronically Signed   By: Lucienne Capers M.D.   On: 11/19/2020 01:47      Assessment/Plan   Francesa Eugenio is a 70 y.o. female with medical history significant for mild intermittent asthma, allergic rhinitis, chronic diarrhea, acquired hypothyroidism, migraines who is admitted to [facility] on 11/19/2020 by way of transfer from [place] emergency department with COVID-19 infection after presenting from home to [facility] ED complaining of nausea and vomiting.    Principal Problem:   COVID-19 virus infection Active Problems:   Mild persistent asthma   Other allergic rhinitis   Gastroesophageal reflux disease without esophagitis   Hypothyroidism   Nausea vomiting and diarrhea   SOB (shortness of breath)   Acute hyponatremia     #) COVID-19 infection: diagnosis on the basis of: 2 to 3 days of shortness of breath new onset cough, subjective fever, chills, generalized myalgias, nausea/vomiting, with COVID-19 PCR found be positive on the morning of 11/19/2020 at [facility] ED.  This occurred in the context of taking care of her husband who is found to be COVID-19 positive over the days preceding the onset of the patient's own similar symptoms . of note, presentation does not appear to  be associated with acute hypoxic respiratory distress/failure, with patient maintaining O2 sats greater than 94% on room air. Overall, it does not appear that criteria are met at the present time for patient's COVID-19 infection to be considered severe in nature. Consequently, there does not  appear to be an indication at this time for initiation of dexamethasone per treatment guidance recommendations from [facility]'s Covid Treatment Guidelines.  Chest x-ray shows no evidence of acute cardiopulmonary process.  Of note, in the setting of the patient's age greater than 8, this patient meets criteria to be considered high risk for a more complicated clinical course of COVID-19 infection, including increased probability for progression of the severity associated with this infection. Therefore, in the setting of symptomatic COVID-19 infection requiring hospitalization for further evaluation and management thereof in this patient with the aforementioned high risk criteria who is felt to be early in the course of their infection given onset of respiratory symptoms starting less than 7 days ago, indications are met for initiation of remdesivir per treatment guidance recommendations from [facility]'s Covid Treatment Guidelines. Of note, ALT found to be less than 220. Therefore, there is no contraindication for initiation of remdesivir on the basis of transaminitis.   Of note, she has a history of mild persistent asthma, is a lifelong non-smoker, denies any known baseline supplemental oxygen requirements.  No history of diabetes.  We will check general inflammatory markers now, and trend with repeat values to be checked in the morning.    Plan: Airborne and contact precautions. Monitor continuous pulse oximetry and monitor on telemetry. prn supplemental O2 to maintain O2 sats greater than or equal to 94%. Proning protocol initiated. PRN albuterol inhaler.  Continue home scheduled Advair. PRN acetaminophen for fever. Start remdesivir, as above. Refraining from initiation of dexamethasone, as above. Check inflammatory markers (fibrinogen, d dimer or fibrin derivatives, crp, ferritin, LDH) now and again in the morning. Check serum magnesium and phosphorus levels. Check CMP and CBC in the morning. Flutter  valve and incentive spirometry.  Check procalcitonin.  As needed IV Zofran. as needed Phenergan for nausea/vomiting refractory to Zofran.        #) Acute hypo-osmolar hyponatremia: Presenting serum sodium of 127 relative to most recent prior value of 138 when checked on 10/13/2020, without need for hyperglycemic corrective intervention. Suspect an element of hypovolumeia, with suspected contribution from dehydration in the setting of recurrent nausea/vomiting over the last few days.  However, differential also includes the possibility of a contribution from SIADH, particularly in the context of acute COVID-19 infection.  Given suspicion for potential contribution from SIADH, for which serum sodium level can worsen response to IV fluids, and given provision of 1.5 L of IV fluids at [facility] ED prior to transfer, will recheck serum sodium level now, along with initiation of additional laboratory evaluation for SIADH prior to initiation of additional IV fluids. No overt pharmacologic contributions. Of note, no evidence of associated acute focal neurologic deficits and no report of recent trauma.  Denies any history of recurrent or recent alcohol consumption.  Overall, will refrain from provision of additional IVF's pending the result of random urine sodium and urine osmolality studies to provide additional insight into potential contributions from dehydration vs SIADH, as subsequent management would differ depending upon these results.     Plan: monitor strict I's and O's and daily weights.  Check BMP now, closely monitoring interval trend in serum sodium, including interval response to IV fluids administered at [facility] ED ,  as above. check random urine sodium, urine osmolality.  Check serum osmolality to confirm suspected hypoosmolar etiology.  Repeat BMP in the morning. Check TSH. Check serum uric acid level, as SIADH can be associated with hypouricemia due to hyperuricuria.  Further evaluation management of  presenting COVID-19 infection, as above.      #) Nausea and vomiting: 2 to 3 days of intermittent nausea resulting in 3-4 episodes of nonbloody, nonbilious emesis over that time.  It appears to be as a consequence of her COVID-19 infection, given coinciding timeframe of onset of the above respiratory symptoms.  Considered initiating as needed IV Ativan as primary antiemetic.  However, will refrain from doing so at this time given the patient's report of a history of rash in response to prior Ativan use.  Instead, we will proceed with as needed Zofran and as needed IV Phenergan, as noted below.  Plan: Further evaluation management of presenting COVID-19 fracture, as above.  As needed IV Zofran.  As needed IV Phenergan for nausea/vomiting refractory to as needed IV Zofran.  Monitor strict I's and O's and daily weights.  Check CMP and serum magnesium level now.  Further evaluation management of acute hyponatremia, as detailed above, which will assist with guiding need for additional IV fluids.  Check TSH.      #) Mild persistent asthma: Documented history of such. Patient reports good compliance with her home respiratory regimen which consists of scheduled Advair as well as as needed albuterol.  While she reports mild shortness of breath over the last 2 to 3 days in the setting of COVID-19 infection, presentation does not appear to be consistent with an overt exacerbation of her asthma at this time.  Denies any associated wheezing, and is maintaining oxygen saturations in the high 90s on room air.  However, she is at increased risk for ensuing development of asthma exacerbation given this COVID-19 infection, will closely monitor ensuing respiratory status accordingly.  Plan: Check serum magnesium level now, with repeat serum mag level in the morning.  Check serum phosphorus level.  Monitor continuous pulse oximetry.  As needed albuterol inhaler.  Continue home Advair.  Further evaluation management of  COVID-19 infection, as above.       #) Allergic rhinitis: In the setting of seasonal allergies, for which the patient is on scheduled Flonase and levocetirizine at home.  In order to reduce risk for ensuing asthma exacerbation, will continue his home pharmacologic interventions on a scheduled basis.  Plan: Continue home Flonase and levocetirizine.       #) GERD: On Protonix as an outpatient.  Plan: Continue home PPI.       #) Acquired hypothyroidism: Documented history of such, on Synthroid as an outpatient.  While presenting nausea/vomiting is felt to be a consequence of COVID-19 infection, will also check TSH to evaluate for any complementary contribution.  Plan: Check TSH.  Continue home Synthroid dose for now.      #) Headache: Symmetrical frontal headache for the last 2 to 3 days, which appears most consistent with a sequelae of the patient's presenting COVID-19 infection.  Symmetrical distribution and absence of associated aura render the possibility of underlying migraine to be less likely at this time.  Considered the possibility of posterior stroke in the context of presenting nausea/vomiting, however, this possibility is felt to be less likely at the present time, particularly given that her nausea/vomiting are also felt to be as a consequence of her COVID-19 infection, as above.  Additionally, presentation  is not associate with any evidence of acute focal neurologic deficits. will continue to closely monitor clinical trend, with consideration for imaging of the head to the patient subsequently demonstrate evidence of acute focal neurologic deficits.   Plan: As needed acetaminophen.  Further evaluation management of presenting COVID-19 infection, as above.  Resume home Topamax.      DVT prophylaxis: SCDs Code Status: Full code Family Communication: none Disposition Plan: Per Rounding Team Consults called: none  Admission status: obs, med-tele     Of note,  this patient was added by me to the following Admit List/Treatment Team: [email or web address].      PLEASE NOTE THAT DRAGON DICTATION SOFTWARE WAS USED IN THE CONSTRUCTION OF THIS NOTE.   [place] [facility] Pager [phone] From [place]  Otherwise, please contact night-coverage  [email or web address] Password [facility]   11/19/2020, 7:34 PM

## 2020-11-19 NOTE — ED Notes (Signed)
Pt assisted to bedside commode  Voided large amount of clear yellow urine  Pt c/o headache   No acute distress noted

## 2020-11-19 NOTE — ED Notes (Signed)
Report given to [facility]. 

## 2020-11-19 NOTE — Progress Notes (Addendum)
Pt arrived on unit. Aox4. Belongings noted in chart. VSS. [place] admitting paged for orders. Placed on tele. Call bell w/I reach. Will continue to monitor.

## 2020-11-19 NOTE — ED Notes (Signed)
Spoke w/ pt's daughter Marlowe Sax, Updated to condition and destination at [facility]. Her phone # is [phone].

## 2020-11-20 ENCOUNTER — Observation Stay (HOSPITAL_COMMUNITY): Payer: PPO

## 2020-11-20 DIAGNOSIS — Z85828 Personal history of other malignant neoplasm of skin: Secondary | ICD-10-CM | POA: Diagnosis not present

## 2020-11-20 DIAGNOSIS — Z882 Allergy status to sulfonamides status: Secondary | ICD-10-CM | POA: Diagnosis not present

## 2020-11-20 DIAGNOSIS — U071 COVID-19: Principal | ICD-10-CM

## 2020-11-20 DIAGNOSIS — J301 Allergic rhinitis due to pollen: Secondary | ICD-10-CM | POA: Diagnosis not present

## 2020-11-20 DIAGNOSIS — E860 Dehydration: Secondary | ICD-10-CM | POA: Diagnosis not present

## 2020-11-20 DIAGNOSIS — Z881 Allergy status to other antibiotic agents status: Secondary | ICD-10-CM | POA: Diagnosis not present

## 2020-11-20 DIAGNOSIS — Z79890 Hormone replacement therapy (postmenopausal): Secondary | ICD-10-CM | POA: Diagnosis not present

## 2020-11-20 DIAGNOSIS — E039 Hypothyroidism, unspecified: Secondary | ICD-10-CM | POA: Diagnosis not present

## 2020-11-20 DIAGNOSIS — R0602 Shortness of breath: Secondary | ICD-10-CM | POA: Diagnosis not present

## 2020-11-20 DIAGNOSIS — E782 Mixed hyperlipidemia: Secondary | ICD-10-CM | POA: Diagnosis not present

## 2020-11-20 DIAGNOSIS — A0839 Other viral enteritis: Secondary | ICD-10-CM | POA: Diagnosis not present

## 2020-11-20 DIAGNOSIS — Z820 Family history of epilepsy and other diseases of the nervous system: Secondary | ICD-10-CM | POA: Diagnosis not present

## 2020-11-20 DIAGNOSIS — K219 Gastro-esophageal reflux disease without esophagitis: Secondary | ICD-10-CM | POA: Diagnosis not present

## 2020-11-20 DIAGNOSIS — Z7982 Long term (current) use of aspirin: Secondary | ICD-10-CM | POA: Diagnosis not present

## 2020-11-20 DIAGNOSIS — J4530 Mild persistent asthma, uncomplicated: Secondary | ICD-10-CM | POA: Diagnosis not present

## 2020-11-20 DIAGNOSIS — Z8719 Personal history of other diseases of the digestive system: Secondary | ICD-10-CM | POA: Diagnosis not present

## 2020-11-20 DIAGNOSIS — Z91040 Latex allergy status: Secondary | ICD-10-CM | POA: Diagnosis not present

## 2020-11-20 DIAGNOSIS — Z825 Family history of asthma and other chronic lower respiratory diseases: Secondary | ICD-10-CM | POA: Diagnosis not present

## 2020-11-20 DIAGNOSIS — Z880 Allergy status to penicillin: Secondary | ICD-10-CM | POA: Diagnosis not present

## 2020-11-20 DIAGNOSIS — I2510 Atherosclerotic heart disease of native coronary artery without angina pectoris: Secondary | ICD-10-CM | POA: Diagnosis not present

## 2020-11-20 DIAGNOSIS — E871 Hypo-osmolality and hyponatremia: Secondary | ICD-10-CM | POA: Diagnosis not present

## 2020-11-20 DIAGNOSIS — Z7951 Long term (current) use of inhaled steroids: Secondary | ICD-10-CM | POA: Diagnosis not present

## 2020-11-20 DIAGNOSIS — Z888 Allergy status to other drugs, medicaments and biological substances status: Secondary | ICD-10-CM | POA: Diagnosis not present

## 2020-11-20 DIAGNOSIS — Z79899 Other long term (current) drug therapy: Secondary | ICD-10-CM | POA: Diagnosis not present

## 2020-11-20 DIAGNOSIS — E876 Hypokalemia: Secondary | ICD-10-CM | POA: Diagnosis not present

## 2020-11-20 LAB — COMPREHENSIVE METABOLIC PANEL
ALT: 18 U/L (ref 0–44)
AST: 21 U/L (ref 15–41)
Albumin: 3.2 g/dL — ABNORMAL LOW (ref 3.5–5.0)
Alkaline Phosphatase: 52 U/L (ref 38–126)
Anion gap: 6 (ref 5–15)
BUN: 6 mg/dL — ABNORMAL LOW (ref 8–23)
CO2: 21 mmol/L — ABNORMAL LOW (ref 22–32)
Calcium: 8.2 mg/dL — ABNORMAL LOW (ref 8.9–10.3)
Chloride: 103 mmol/L (ref 98–111)
Creatinine, Ser: 0.68 mg/dL (ref 0.44–1.00)
GFR, Estimated: >60 mL/min (ref >60)
Glucose, Bld: 99 mg/dL (ref 70–99)
Potassium: 2.8 mmol/L — ABNORMAL LOW (ref 3.5–5.1)
Sodium: 130 mmol/L — ABNORMAL LOW (ref 135–145)
Total Bilirubin: 0.5 mg/dL (ref 0.3–1.2)
Total Protein: 5.7 g/dL — ABNORMAL LOW (ref 6.5–8.1)

## 2020-11-20 LAB — CBC
HCT: 36.4 % (ref 36.0–46.0)
Hemoglobin: 12.6 g/dL (ref 12.0–15.0)
MCH: 30.4 pg (ref 26.0–34.0)
MCHC: 34.6 g/dL (ref 30.0–36.0)
MCV: 87.9 fL (ref 80.0–100.0)
Platelets: 164 10*3/uL (ref 150–400)
RBC: 4.14 MIL/uL (ref 3.87–5.11)
RDW: 12.5 % (ref 11.5–15.5)
WBC: 3.2 10*3/uL — ABNORMAL LOW (ref 4.0–10.5)
nRBC: 0 % (ref 0.0–0.2)

## 2020-11-20 LAB — MAGNESIUM: Magnesium: 1.9 mg/dL (ref 1.7–2.4)

## 2020-11-20 LAB — PROCALCITONIN: Procalcitonin: <0.1 ng/mL

## 2020-11-20 LAB — HIV ANTIBODY (ROUTINE TESTING W REFLEX): HIV Screen 4th Generation wRfx: NONREACTIVE

## 2020-11-20 LAB — C-REACTIVE PROTEIN: CRP: 0.6 mg/dL (ref <1.0)

## 2020-11-20 LAB — FIBRINOGEN: Fibrinogen: 274 mg/dL (ref 210–475)

## 2020-11-20 LAB — OSMOLALITY, URINE: Osmolality, Ur: 193 mOsm/kg — ABNORMAL LOW (ref 300–900)

## 2020-11-20 LAB — LACTATE DEHYDROGENASE: LDH: 129 U/L (ref 98–192)

## 2020-11-20 LAB — SODIUM, URINE, RANDOM: Sodium, Ur: 61 mmol/L

## 2020-11-20 LAB — PHOSPHORUS: Phosphorus: 2.5 mg/dL (ref 2.5–4.6)

## 2020-11-20 LAB — FERRITIN: Ferritin: 82 ng/mL (ref 11–307)

## 2020-11-20 LAB — D-DIMER, QUANTITATIVE: D-Dimer, Quant: 0.63 ug/mL-FEU — ABNORMAL HIGH (ref 0.00–0.50)

## 2020-11-20 MED ORDER — POTASSIUM CHLORIDE CRYS ER 20 MEQ PO TBCR
40.0000 meq | EXTENDED_RELEASE_TABLET | Freq: Once | ORAL | Status: AC
  Administered 2020-11-20: 40 meq via ORAL
  Filled 2020-11-20: qty 2

## 2020-11-20 MED ORDER — POTASSIUM CHLORIDE CRYS ER 20 MEQ PO TBCR: 20.0000 meq | EXTENDED_RELEASE_TABLET | Freq: Once | ORAL | Status: DC

## 2020-11-20 MED ORDER — GUAIFENESIN-DM 100-10 MG/5ML PO SYRP
5.0000 mL | ORAL_SOLUTION | ORAL | Status: DC | PRN
  Filled 2020-11-20: qty 5

## 2020-11-20 MED ORDER — VITAMIN D 25 MCG (1000 UNIT) PO TABS
1000.0000 [IU] | ORAL_TABLET | Freq: Every day | ORAL | Status: DC
  Administered 2020-11-20 – 2020-11-22 (×3): 1000 [IU] via ORAL
  Filled 2020-11-20 (×3): qty 1

## 2020-11-20 MED ORDER — POTASSIUM CHLORIDE IN NACL 40-0.9 MEQ/L-% IV SOLN
INTRAVENOUS | Status: DC
  Filled 2020-11-20 (×2): qty 1000

## 2020-11-20 MED ORDER — PHENOL 1.4 % MT LIQD
1.0000 | OROMUCOSAL | Status: DC | PRN
  Filled 2020-11-20: qty 177

## 2020-11-20 MED ORDER — ENOXAPARIN SODIUM 40 MG/0.4ML IJ SOSY
40.0000 mg | PREFILLED_SYRINGE | INTRAMUSCULAR | Status: DC
  Administered 2020-11-20 – 2020-11-22 (×3): 40 mg via SUBCUTANEOUS
  Filled 2020-11-20 (×3): qty 0.4

## 2020-11-20 NOTE — Plan of Care (Signed)
  Problem: Clinical Measurements: Goal: Ability to maintain clinical measurements within normal limits will improve Outcome: Progressing   Problem: Clinical Measurements: Goal: Will remain free from infection Outcome: Progressing   Problem: Clinical Measurements: Goal: Diagnostic test results will improve Outcome: Progressing   

## 2020-11-20 NOTE — Progress Notes (Signed)
PROGRESS NOTE                                                                                                                                                                                                             Patient Demographics:    Jenna Campbell, is a 70 y.o. female, DOB - [birth year], WUJ:[id number]  Outpatient Primary MD for the patient is Shelda Pal, DO    LOS - 0  Admit date - 11/19/2020    Chief Complaint  Patient presents with   Vomiting       Brief Narrative (HPI from H&P) - Jenna Campbell is a 70 y.o. female with medical history significant for mild intermittent asthma, allergic rhinitis, chronic diarrhea, acquired hypothyroidism, migraines who is admitted to [facility] on 11/19/2020 by way of transfer from [place] emergency department with COVID-19 infection after presenting from home to [facility] ED complaining of nausea and vomiting, she was diagnosed with dehydration, hyponatremia and generalized weakness and kept in the hospital.   Subjective:    Jenna Campbell today has, No headache, No chest pain, No abdominal pain - No Nausea, No new weakness tingling or numbness, no SOB, improved N&V.   Assessment  & Plan :     Acute COVID-19 infection in a patient who is fully vaccinated and boosted - she has no pulmonary involvement, she had developed some gastroenteritis from COVID-19 causing dehydration and hypokalemia.  Supportive care, IV fluids, replace potassium, monitor clinically along with inflammatory markers.  Advance activity.  PT OT. Encouraged the patient to sit up in chair in the daytime use I-S and flutter valve for pulmonary toiletry.      SpO2: 100 %  Recent Labs  Lab 11/19/20 0020 11/19/20 0125 11/19/20 2011 11/20/20 0132  WBC 5.4  --  3.0* 3.2*  HGB 12.8  --  13.2 12.6  HCT 36.2  --  36.2 36.4  PLT 200  --  120* 164  CRP  --   --  <0.5 0.6  DDIMER  --   --   0.61* 0.63*  PROCALCITON  --   --  <0.10  --   AST 21  --  25 21  ALT 18  --  15 18  ALKPHOS 58  --  52 52  BILITOT 0.4  --  0.6  0.5  ALBUMIN 4.1  --  3.1* 3.2*  SARSCOV2NAA  --  POSITIVE*  --   --    Lab Results  Component Value Date   TSH 1.448 11/19/2020     2.  Hypothyroidism.  On Synthroid stable TSH.  3.  GERD.  On PPI.  4.  Dehydration causing hyponatremia and severe hypokalemia.  Aggressively replaced potassium, IV fluids for hyponatremia and dehydration.      Condition - Fair  Family Communication  :  None  Code Status :  Full  Consults  :  None  PUD Prophylaxis : PPI   Procedures  :            Disposition Plan  :    Status is: Inpt    Dispo: The patient is from: Home              Anticipated d/c is to: Home              Patient currently is not medically stable to d/c.   Difficult to place patient No  DVT Prophylaxis  :  Lovenox added SCDs Start: 11/19/20 1906   Lab Results  Component Value Date   PLT 164 11/20/2020    Diet :  Diet Order             Diet regular Room service appropriate? Yes; Fluid consistency: Thin  Diet effective now                    Inpatient Medications  Scheduled Meds:  aspirin  81 mg Oral Daily   cholecalciferol  1,000 Units Oral Daily   feeding supplement  237 mL Oral BID BM   fluticasone  2 spray Each Nare Daily   gabapentin  300 mg Oral q AM   levothyroxine  225 mcg Oral QAC breakfast   loratadine  10 mg Oral Daily   mometasone-formoterol  2 puff Inhalation BID   olopatadine  1 drop Both Eyes BID   pantoprazole  40 mg Oral BID   potassium chloride  20 mEq Oral Once   potassium chloride  40 mEq Oral Once   topiramate  100 mg Oral QHS   Continuous Infusions:  0.9 % NaCl with KCl 40 mEq / L     promethazine (PHENERGAN) injection (IM or IVPB)     remdesivir 100 mg in NS 100 mL     PRN Meds:.acetaminophen **OR** acetaminophen, albuterol, ondansetron (ZOFRAN) IV, promethazine (PHENERGAN)  injection (IM or IVPB), temazepam  Antibiotics  :    Anti-infectives (From admission, onward)    Start     Dose/Rate Route Frequency Ordered Stop   11/20/20 1000  remdesivir 100 mg in sodium chloride 0.9 % 100 mL IVPB        100 mg 200 mL/hr over 30 Minutes Intravenous Daily 11/19/20 1954 11/24/20 0959   11/19/20 2045  remdesivir 200 mg in sodium chloride 0.9% 250 mL IVPB        200 mg 580 mL/hr over 30 Minutes Intravenous Once 11/19/20 1954 11/19/20 2202        Time Spent in minutes  30   Lala Lund M.D on 11/20/2020 at 8:59 AM  To page go to [email or web address]   [facility] -  Office  [phone]    See all Orders from today for further details    Objective:   Vitals:   11/19/20 2326 11/20/20 0347 11/20/20 0500 11/20/20 0728  BP: (!) 149/56 (!) 130/58  Marland Kitchen)  134/53  Pulse: 60 (!) 50  (!) 48  Resp: 18 18  17   Temp: 98.8 F (37.1 C) 98.6 F (37 C)  98.6 F (37 C)  TempSrc: Oral Oral  Oral  SpO2: 100% 100%  100%  Weight:   66.4 kg   Height:        Wt Readings from Last 3 Encounters:  11/20/20 66.4 kg  11/10/20 64.5 kg  10/13/20 64 kg     Intake/Output Summary (Last 24 hours) at 11/20/2020 0859 Last data filed at 11/20/2020 0522 Gross per 24 hour  Intake 600 ml  Output --  Net 600 ml     Physical Exam  Awake Alert, No new F.N deficits, Normal affect [place].AT,PERRAL Supple Neck,No JVD, No cervical lymphadenopathy appriciated.  Symmetrical Chest wall movement, Good air movement bilaterally, CTAB RRR,No Gallops,Rubs or new Murmurs, No Parasternal Heave +ve B.Sounds, Abd Soft, No tenderness, No organomegaly appriciated, No rebound - guarding or rigidity. No Cyanosis, Clubbing or edema, No new Rash or bruise        Data Review:    CBC Recent Labs  Lab 11/19/20 0020 11/19/20 2011 11/20/20 0132  WBC 5.4 3.0* 3.2*  HGB 12.8 13.2 12.6  HCT 36.2 36.2 36.4  PLT 200 120* 164  MCV 88.1 85.4 87.9  MCH 31.1 31.1 30.4  MCHC 35.4 36.5* 34.6  RDW 12.3  12.2 12.5  LYMPHSABS 0.4*  --   --   MONOABS 1.0  --   --   EOSABS 0.0  --   --   BASOSABS 0.0  --   --     Recent Labs  Lab 11/19/20 0020 11/19/20 2011 11/19/20 2019 11/20/20 0132  NA 127* 131*  --  130*  K 3.5 3.5  --  2.8*  CL 97* 104  --  103  CO2 23 20*  --  21*  GLUCOSE 137* 86  --  99  BUN 8 7*  --  6*  CREATININE 0.60 0.68  --  0.68  CALCIUM 8.3* 8.3*  --  8.2*  AST 21 25  --  21  ALT 18 15  --  18  ALKPHOS 58 52  --  52  BILITOT 0.4 0.6  --  0.5  ALBUMIN 4.1 3.1*  --  3.2*  MG  --  2.0  --  1.9  CRP  --  <0.5  --  0.6  DDIMER  --  0.61*  --  0.63*  PROCALCITON  --  <0.10  --   --   TSH  --   --  1.448  --     ------------------------------------------------------------------------------------------------------------------ No results for input(s): CHOL, HDL, LDLCALC, TRIG, CHOLHDL, LDLDIRECT in the last 72 hours.  No results found for: HGBA1C ------------------------------------------------------------------------------------------------------------------ Recent Labs    11/19/20 2019  TSH 1.448    Cardiac Enzymes No results for input(s): CKMB, TROPONINI, MYOGLOBIN in the last 168 hours.  Invalid input(s): CK ------------------------------------------------------------------------------------------------------------------    Component Value Date/Time   BNP 45.5 07/27/2019 1829    Micro Results Recent Results (from the past 240 hour(s))  Urine Culture     Status: None   Collection Time: 11/10/20  1:27 PM   Specimen: Urine  Result Value Ref Range Status   MICRO NUMBER: [id number]  Final   SPECIMEN QUALITY: Adequate  Final   Sample Source NOT GIVEN  Final   STATUS: FINAL  Final   Result: No Growth  Final  Resp Panel by RT-PCR (Flu A&B, Covid)  Nasopharyngeal Swab     Status: Abnormal   Collection Time: 11/19/20  1:25 AM   Specimen: Nasopharyngeal Swab; Nasopharyngeal(NP) swabs in vial transport medium  Result Value Ref Range Status   SARS  Coronavirus 2 by RT PCR POSITIVE (A) NEGATIVE Final    Comment: RESULT CALLED TO, READ BACK BY AND VERIFIED WITH: KELLIE NEAL,RN ON 11/19/20 AT 0210 BY JAG (NOTE) SARS-CoV-2 target nucleic acids are DETECTED.  The SARS-CoV-2 RNA is generally detectable in upper respiratory specimens during the acute phase of infection. Positive results are indicative of the presence of the identified virus, but do not rule out bacterial infection or co-infection with other pathogens not detected by the test. Clinical correlation with patient history and other diagnostic information is necessary to determine patient infection status. The expected result is Negative.  Fact Sheet for Patients: EntrepreneurPulse.com.au  Fact Sheet for Healthcare Providers: IncredibleEmployment.be  This test is not yet approved or cleared by the [place] FDA and  has been authorized for detection and/or diagnosis of SARS-CoV-2 by FDA under an Emergency Use Authorization (EUA).  This EUA will remain in effect (meaning this tes t can be used) for the duration of  the COVID-19 declaration under Section 564(b)(1) of the Act, 21 U.S.C. section 360bbb-3(b)(1), unless the authorization is terminated or revoked sooner.     Influenza A by PCR NEGATIVE NEGATIVE Final   Influenza B by PCR NEGATIVE NEGATIVE Final    Comment: (NOTE) The Xpert Xpress SARS-CoV-2/FLU/RSV plus assay is intended as an aid in the diagnosis of influenza from Nasopharyngeal swab specimens and should not be used as a sole basis for treatment. Nasal washings and aspirates are unacceptable for Xpert Xpress SARS-CoV-2/FLU/RSV testing.  Fact Sheet for Patients: EntrepreneurPulse.com.au  Fact Sheet for Healthcare Providers: IncredibleEmployment.be  This test is not yet approved or cleared by the [place] FDA and has been authorized for detection and/or diagnosis of  SARS-CoV-2 by FDA under an Emergency Use Authorization (EUA). This EUA will remain in effect (meaning this test can be used) for the duration of the COVID-19 declaration under Section 564(b)(1) of the Act, 21 U.S.C. section 360bbb-3(b)(1), unless the authorization is terminated or revoked.  Performed at [facility], [address], [place], [place] [address]     Radiology Reports DG Chest [place] 1 View  Result Date: 11/20/2020 CLINICAL DATA:  Shortness of breath.  COVID positive EXAM: PORTABLE CHEST 1 VIEW COMPARISON:  Yesterday FINDINGS: Normal heart size and mediastinal contours. No acute infiltrate or edema. No effusion or pneumothorax. No acute osseous findings. Artifact from EKG leads IMPRESSION: Stable, clear chest. Electronically Signed   By: Monte Fantasia M.D.   On: 11/20/2020 08:32   DG Chest Port 1 View  Result Date: 11/19/2020 CLINICAL DATA:  Shortness of breath. COVID positive. Vomiting and diarrhea. EXAM: PORTABLE CHEST 1 VIEW COMPARISON:  05/11/2020 FINDINGS: The heart size and mediastinal contours are within normal limits. Both lungs are clear. The visualized skeletal structures are unremarkable. IMPRESSION: No active disease. Electronically Signed   By: Lucienne Capers M.D.   On: 11/19/2020 01:47

## 2020-11-20 NOTE — Evaluation (Signed)
Physical Therapy Evaluation Patient Details Name: Jenna Campbell MRN: [id number] DOB: 1950-11-27 Today's Date: 11/20/2020   History of Present Illness  Jenna Campbell is a 70 y.o. female who is admitted on 11/19/2020 with COVID-19 infection, N/V, hypokalemia and dehydration.  Pt with significant PMH for mild intermittent asthma, allergic rhinitis, chronic diarrhea, acquired hypothyroidism, migraines.  Clinical Impression  Pt is slow and unsteady on her feet, reporting dizziness and nausea with mobility.  Gross visual screen did not reveal any brisk nystagmus, however, further vestibular testing needs to be done if nausea continues at this intensity.  She is more unsteady on her feet than PTA where she mobilized generally without an assistive device, cooking, cleaning and caring for her roommate who had COVID before her.   VSS on RA throughout without reports of DOE.   PT to follow acutely for deficits listed below.       Follow Up Recommendations No PT follow up;Supervision for mobility/OOB    Equipment Recommendations  Other (comment) (shower chair)    Recommendations for Other Services       Precautions / Restrictions Precautions Precautions: Fall      Mobility  Bed Mobility Overal bed mobility: Modified Independent             General bed mobility comments: HOB mildly elevated light use of railing to return to bed from sitting    Transfers Overall transfer level: Needs assistance Equipment used: 1 person hand held assist Transfers: Sit to/from Stand Sit to Stand: Min guard         General transfer comment: Min guard assist for safety and balance, stood a bit longer to make sure it was safe to progress away from the chair.  Ambulation/Gait Ambulation/Gait assistance: Min assist Gait Distance (Feet): 25 Feet Assistive device: IV Pole;1 person hand held assist Gait Pattern/deviations: Step-through pattern;Staggering left;Staggering right   Gait velocity interpretation:  <1.31 ft/sec, indicative of household ambulator General Gait Details: Pt with very slow, cautious gait pattern, mildly staggering with support of PT and IV pole.  Stairs            Wheelchair Mobility    Modified Rankin (Stroke Patients Only)       Balance Overall balance assessment: Mild deficits observed, not formally tested                                           Pertinent Vitals/Pain Pain Assessment: 0-10 Pain Score: 8  Pain Location: throat pain Pain Descriptors / Indicators: Sore Pain Intervention(s): Limited activity within patient's tolerance;Monitored during session;Repositioned    Home Living Family/patient expects to be discharged to:: Private residence Living Arrangements: Other (Comment) (friend)   Type of Home: House Home Access: Stairs to enter Entrance Stairs-Rails: Right Entrance Stairs-Number of Steps: 4-5 with rails Home Layout: One level Home Equipment: Cane - single point;Cane - quad;Walker - 2 wheels ("he won't let me use the walker inside" per her roommate)      Prior Function Level of Independence: Independent         Comments: independent with all ADL, pt's friend was assisting with lawn care, driving and grocery shopping     Hand Dominance   Dominant Hand: Right    Extremity/Trunk Assessment   Upper Extremity Assessment Upper Extremity Assessment: Defer to OT evaluation    Lower Extremity Assessment Lower Extremity Assessment: Generalized weakness  Cervical / Trunk Assessment Cervical / Trunk Assessment: Normal  Communication   Communication: No difficulties  Cognition Arousal/Alertness: Awake/alert Behavior During Therapy: WFL for tasks assessed/performed Overall Cognitive Status: Within Functional Limits for tasks assessed                                        General Comments General comments (skin integrity, edema, etc.): Pt reporting dizziness "like I am on a ride"  (spinning).  PT did gross visual assessment and did not see any obvious gaze induced nystagmus, but may need to be assessed further by vestibular therapist next session.  She was too nauseated to do much more with me today.  Also, asked RN secertary to order IS and Flutter valve    Exercises     Assessment/Plan    PT Assessment Patient needs continued PT services  PT Problem List Decreased strength;Decreased activity tolerance;Decreased balance;Decreased mobility;Decreased knowledge of use of DME       PT Treatment Interventions DME instruction;Gait training;Stair training;Functional mobility training;Therapeutic activities;Therapeutic exercise;Balance training;Neuromuscular re-education;Patient/family education    PT Goals (Current goals can be found in the Care Plan section)  Acute Rehab PT Goals Patient Stated Goal: to get stronger and return home PT Goal Formulation: With patient Time For Goal Achievement: 12/04/20 Potential to Achieve Goals: Good    Frequency Min 3X/week   Barriers to discharge        Co-evaluation               AM-PAC PT "6 Clicks" Mobility  Outcome Measure Help needed turning from your back to your side while in a flat bed without using bedrails?: A Little Help needed moving from lying on your back to sitting on the side of a flat bed without using bedrails?: A Little Help needed moving to and from a bed to a chair (including a wheelchair)?: A Little Help needed standing up from a chair using your arms (e.g., wheelchair or bedside chair)?: A Little Help needed to walk in hospital room?: A Little Help needed climbing 3-5 steps with a railing? : A Little 6 Click Score: 18    End of Session   Activity Tolerance: Other (comment);Patient limited by fatigue (limited by nausea, fatigue) Patient left: in bed;with call bell/phone within reach   PT Visit Diagnosis: Muscle weakness (generalized) (M62.81);Difficulty in walking, not elsewhere classified  (R26.2);Unsteadiness on feet (R26.81)    Time: 9937-1696 PT Time Calculation (min) (ACUTE ONLY): 27 min   Charges:   PT Evaluation $PT Eval Moderate Complexity: 1 Mod PT Treatments $Gait Training: 8-22 mins      Verdene Lennert, PT, DPT  Acute Rehabilitation Ortho Tech Supervisor [phone] pager [phone] office

## 2020-11-20 NOTE — Evaluation (Signed)
Occupational Therapy Evaluation Patient Details Name: Jenna Campbell MRN: [id number] DOB: 04-30-1951 Today's Date: 11/20/2020    History of Present Illness Jenna Campbell is a 70 y.o. female with medical history significant for mild intermittent asthma, allergic rhinitis, chronic diarrhea, acquired hypothyroidism, migraines who is admitted to [facility] on 11/19/2020 by way of transfer from [place] emergency department with COVID-19 infection after presenting from home to [facility] ED complaining of nausea and vomiting.   Clinical Impression   PTA, pt was living at home with her friend, pt reports she was independent with ADL/IADL and functional mobility without AD. Pt currently requires minguard for functional mobility for safety due to c/o dizziness. intermittent dizziness BP 155/69;HR 40's-60's;SpO2 >92% on RA. Pt with no loss of balance during this session. Pt completed grooming while standing at sink level and voided on commode. Due to decline in current level of function, pt would benefit from acute OT to address established goals to facilitate safe D/C to venue listed below. At this time, no follow-up OT is recommended. Will continue to follow acutely.     Follow Up Recommendations  No OT follow up;Supervision - Intermittent    Equipment Recommendations  None recommended by OT    Recommendations for Other Services       Precautions / Restrictions Precautions Precautions: Fall      Mobility Bed Mobility Overal bed mobility: Modified Independent             General bed mobility comments: HOB elevated, pt utilizing bed rails    Transfers Overall transfer level: Needs assistance Equipment used: None Transfers: Sit to/from Stand Sit to Stand: Min guard         General transfer comment: for safety    Balance Overall balance assessment: Mild deficits observed, not formally tested                                         ADL either  performed or assessed with clinical judgement   ADL Overall ADL's : Needs assistance/impaired Eating/Feeding: Independent   Grooming: Supervision/safety;Standing Grooming Details (indicate cue type and reason): completed at sink Upper Body Bathing: Supervision/ safety   Lower Body Bathing: Min guard;Sit to/from stand   Upper Body Dressing : Independent   Lower Body Dressing: Min guard;Sit to/from stand   Toilet Transfer: Min guard;Ambulation   Toileting- Clothing Manipulation and Hygiene: Min guard;Sit to/from stand Toileting - Clothing Manipulation Details (indicate cue type and reason): pt voided during session     Functional mobility during ADLs: Min guard General ADL Comments: minguard for safety, pt with intermittent dizziness BP 155/69;HR 40's-60's;SpO2 >92% on RA     Vision         Perception     Praxis      Pertinent Vitals/Pain Pain Assessment: 0-10 Pain Score: 8  Pain Location: throat pain Pain Descriptors / Indicators: Sharp Pain Intervention(s): Limited activity within patient's tolerance;Monitored during session     Hand Dominance Right   Extremity/Trunk Assessment Upper Extremity Assessment Upper Extremity Assessment: Overall WFL for tasks assessed   Lower Extremity Assessment Lower Extremity Assessment: Overall WFL for tasks assessed   Cervical / Trunk Assessment Cervical / Trunk Assessment: Normal   Communication Communication Communication: No difficulties   Cognition Arousal/Alertness: Awake/alert Behavior During Therapy: WFL for tasks assessed/performed Overall Cognitive Status: Within Functional Limits for tasks assessed  General Comments       Exercises     Shoulder Instructions      Home Living Family/patient expects to be discharged to:: Private residence Living Arrangements: Other (Comment) (friend)   Type of Home: House Home Access: Stairs to enter [employer] of Steps: 4-5 with rails Entrance Stairs-Rails: Right Home Layout: One level     Bathroom Shower/Tub: Tub/shower unit;Walk-in shower   Bathroom Toilet: Standard     Home Equipment: Cane - single point;Cane - quad          Prior Functioning/Environment Level of Independence: Independent        Comments: independent with all ADL, pt's friend was assisting with lawn care, driving and grocery shopping        OT Problem List: Decreased activity tolerance;Decreased safety awareness      OT Treatment/Interventions: Self-care/ADL training;Therapeutic exercise;Energy conservation;Therapeutic activities;Patient/family education;Balance training    OT Goals(Current goals can be found in the care plan section) Acute Rehab OT Goals Patient Stated Goal: to get stronger and return home OT Goal Formulation: With patient Time For Goal Achievement: 12/04/20 Potential to Achieve Goals: Good ADL Goals Pt Will Perform Grooming: Independently;standing Pt Will Perform Lower Body Dressing: Independently;sit to/from stand Pt Will Transfer to Toilet: Independently;ambulating  OT Frequency: Min 2X/week   Barriers to D/C:            Co-evaluation              AM-PAC OT "6 Clicks" Daily Activity     Outcome Measure Help from another person eating meals?: None Help from another person taking care of personal grooming?: A Little Help from another person toileting, which includes using toliet, bedpan, or urinal?: A Little Help from another person bathing (including washing, rinsing, drying)?: A Little Help from another person to put on and taking off regular upper body clothing?: None Help from another person to put on and taking off regular lower body clothing?: A Little 6 Click Score: 20   End of Session Equipment Utilized During Treatment: Gait belt Nurse Communication: Mobility status  Activity Tolerance: Patient tolerated treatment well Patient left: in  chair;with call bell/phone within reach;with chair alarm set  OT Visit Diagnosis: Unsteadiness on feet (R26.81);Other abnormalities of gait and mobility (R26.89)                Time: 2248-2500 OT Time Calculation (min): 32 min Charges:  OT General Charges $OT Visit: 1 Visit OT Evaluation $OT Eval Moderate Complexity: 1 Mod OT Treatments $Self Care/Home Management : 8-22 mins  Helene Kelp OTR/L Acute Rehabilitation Services Office: [phone]   Wyn Forster 11/20/2020, 1:33 PM

## 2020-11-21 LAB — CBC WITH DIFFERENTIAL/PLATELET
Abs Immature Granulocytes: 0.01 10*3/uL (ref 0.00–0.07)
Basophils Absolute: 0 10*3/uL (ref 0.0–0.1)
Basophils Relative: 1 %
Eosinophils Absolute: 0 10*3/uL (ref 0.0–0.5)
Eosinophils Relative: 0 %
HCT: 42.7 % (ref 36.0–46.0)
Hemoglobin: 14.6 g/dL (ref 12.0–15.0)
Immature Granulocytes: 0 %
Lymphocytes Relative: 47 %
Lymphs Abs: 1.6 10*3/uL (ref 0.7–4.0)
MCH: 30.4 pg (ref 26.0–34.0)
MCHC: 34.2 g/dL (ref 30.0–36.0)
MCV: 89 fL (ref 80.0–100.0)
Monocytes Absolute: 0.7 10*3/uL (ref 0.1–1.0)
Monocytes Relative: 19 %
Neutro Abs: 1.2 10*3/uL — ABNORMAL LOW (ref 1.7–7.7)
Neutrophils Relative %: 33 %
Platelets: 210 10*3/uL (ref 150–400)
RBC: 4.8 MIL/uL (ref 3.87–5.11)
RDW: 12.6 % (ref 11.5–15.5)
WBC: 3.5 10*3/uL — ABNORMAL LOW (ref 4.0–10.5)
nRBC: 0 % (ref 0.0–0.2)

## 2020-11-21 LAB — COMPREHENSIVE METABOLIC PANEL
ALT: 21 U/L (ref 0–44)
AST: 29 U/L (ref 15–41)
Albumin: 3.5 g/dL (ref 3.5–5.0)
Alkaline Phosphatase: 55 U/L (ref 38–126)
Anion gap: 10 (ref 5–15)
BUN: 10 mg/dL (ref 8–23)
CO2: 22 mmol/L (ref 22–32)
Calcium: 8.5 mg/dL — ABNORMAL LOW (ref 8.9–10.3)
Chloride: 100 mmol/L (ref 98–111)
Creatinine, Ser: 0.71 mg/dL (ref 0.44–1.00)
GFR, Estimated: >60 mL/min (ref >60)
Glucose, Bld: 94 mg/dL (ref 70–99)
Potassium: 3.6 mmol/L (ref 3.5–5.1)
Sodium: 132 mmol/L — ABNORMAL LOW (ref 135–145)
Total Bilirubin: 0.5 mg/dL (ref 0.3–1.2)
Total Protein: 6.2 g/dL — ABNORMAL LOW (ref 6.5–8.1)

## 2020-11-21 LAB — C-REACTIVE PROTEIN: CRP: 0.8 mg/dL (ref <1.0)

## 2020-11-21 LAB — MAGNESIUM: Magnesium: 1.9 mg/dL (ref 1.7–2.4)

## 2020-11-21 LAB — BRAIN NATRIURETIC PEPTIDE: B Natriuretic Peptide: 94.9 pg/mL (ref 0.0–100.0)

## 2020-11-21 LAB — D-DIMER, QUANTITATIVE: D-Dimer, Quant: 0.72 ug/mL-FEU — ABNORMAL HIGH (ref 0.00–0.50)

## 2020-11-21 MED ORDER — IPRATROPIUM-ALBUTEROL 0.5-2.5 (3) MG/3ML IN SOLN: 3.0000 mL | Freq: Once | RESPIRATORY_TRACT | Status: AC

## 2020-11-21 MED ORDER — LACTATED RINGERS IV SOLN: INTRAVENOUS | Status: DC

## 2020-11-21 MED ORDER — IPRATROPIUM-ALBUTEROL 0.5-2.5 (3) MG/3ML IN SOLN: 3.0000 mL | RESPIRATORY_TRACT | Status: DC | PRN

## 2020-11-21 MED ORDER — IPRATROPIUM-ALBUTEROL 0.5-2.5 (3) MG/3ML IN SOLN
RESPIRATORY_TRACT | Status: AC
  Administered 2020-11-21: 3 mL via RESPIRATORY_TRACT
  Filled 2020-11-21: qty 3

## 2020-11-21 MED ORDER — HYDROCOD POLST-CPM POLST ER 10-8 MG/5ML PO SUER
5.0000 mL | Freq: Two times a day (BID) | ORAL | Status: DC | PRN
  Administered 2020-11-21 (×2): 5 mL via ORAL
  Filled 2020-11-21 (×2): qty 5

## 2020-11-21 NOTE — Progress Notes (Signed)
PROGRESS NOTE                                                                                                                                                                                                             Patient Demographics:    Jenna Campbell, is a 70 y.o. female, DOB - 10-09-1950, MQK:[id number]  Outpatient Primary MD for the patient is Shelda Pal, DO    LOS - 1  Admit date - 11/19/2020    Chief Complaint  Patient presents with   Vomiting       Brief Narrative (HPI from H&P) - Jenna Campbell is a 70 y.o. female with medical history significant for mild intermittent asthma, allergic rhinitis, chronic diarrhea, acquired hypothyroidism, migraines who is admitted to [facility] on 11/19/2020 by way of transfer from [place] emergency department with COVID-19 infection after presenting from home to [facility] ED complaining of nausea and vomiting, she was diagnosed with dehydration, hyponatremia and generalized weakness and kept in the hospital.   Subjective:   Patient in bed, appears comfortable, denies any headache, no fever, no chest pain or pressure, no shortness of breath , no abdominal pain. No new focal weakness.    Assessment  & Plan :     Acute COVID-19 infection in a patient who is fully vaccinated and boosted - she has no pulmonary involvement, she had developed some gastroenteritis from COVID-19 causing dehydration and hypokalemia.  Supportive care, IV fluids, replace potassium, monitor clinically along with inflammatory markers.  Advance activity.  PT OT. Encouraged the patient to sit up in chair in the daytime use I-S and flutter valve for pulmonary toiletry.      SpO2: 97 %  Recent Labs  Lab 11/19/20 0020 11/19/20 0125 11/19/20 2011 11/20/20 0132 11/21/20 0033  WBC 5.4  --  3.0* 3.2* 3.5*  HGB 12.8  --  13.2 12.6 14.6  HCT 36.2  --  36.2 36.4 42.7  PLT 200  --  120*  164 210  CRP  --   --  <0.5 0.6 0.8  BNP  --   --   --   --  94.9  DDIMER  --   --  0.61* 0.63* 0.72*  PROCALCITON  --   --  <0.10  --   --   AST 21  --  25 21 29   ALT 18  --  15 18 21   ALKPHOS 58  --  52 52 55  BILITOT 0.4  --  0.6 0.5 0.5  ALBUMIN 4.1  --  3.1* 3.2* 3.5  SARSCOV2NAA  --  POSITIVE*  --   --   --    Lab Results  Component Value Date   TSH 1.448 11/19/2020     2.  Hypothyroidism.  On Synthroid stable TSH.  3.  GERD.  On PPI.  4.  Dehydration causing hyponatremia and severe hypokalemia.  Aggressively replaced potassium, continue IV fluids for hyponatremia and dehydration.  5. Asthma - no wheezing, supportive Rx.      Condition - Fair  Family Communication  :  None  Code Status :  Full  Consults  :  None  PUD Prophylaxis : PPI   Procedures  :            Disposition Plan  :    Status is: Inpt    Dispo: The patient is from: Home              Anticipated d/c is to: Home              Patient currently is not medically stable to d/c.   Difficult to place patient No  DVT Prophylaxis  :  Lovenox added enoxaparin (LOVENOX) injection 40 mg Start: 11/20/20 1000 SCDs Start: 11/19/20 1906   Lab Results  Component Value Date   PLT 210 11/21/2020    Diet :  Diet Order             Diet regular Room service appropriate? Yes; Fluid consistency: Thin  Diet effective now                    Inpatient Medications  Scheduled Meds:  aspirin  81 mg Oral Daily   cholecalciferol  1,000 Units Oral Daily   enoxaparin (LOVENOX) injection  40 mg Subcutaneous Q24H   feeding supplement  237 mL Oral BID BM   fluticasone  2 spray Each Nare Daily   gabapentin  300 mg Oral q AM   levothyroxine  225 mcg Oral QAC breakfast   loratadine  10 mg Oral Daily   mometasone-formoterol  2 puff Inhalation BID   olopatadine  1 drop Both Eyes BID   pantoprazole  40 mg Oral BID   potassium chloride  20 mEq Oral Once   topiramate  100 mg Oral QHS   Continuous  Infusions:  lactated ringers     promethazine (PHENERGAN) injection (IM or IVPB)     remdesivir 100 mg in NS 100 mL 100 mg (11/20/20 1036)   PRN Meds:.acetaminophen **OR** acetaminophen, albuterol, chlorpheniramine-HYDROcodone, ondansetron (ZOFRAN) IV, phenol, promethazine (PHENERGAN) injection (IM or IVPB), temazepam  Antibiotics  :    Anti-infectives (From admission, onward)    Start     Dose/Rate Route Frequency Ordered Stop   11/20/20 1000  remdesivir 100 mg in sodium chloride 0.9 % 100 mL IVPB        100 mg 200 mL/hr over 30 Minutes Intravenous Daily 11/19/20 1954 11/24/20 0959   11/19/20 2045  remdesivir 200 mg in sodium chloride 0.9% 250 mL IVPB        200 mg 580 mL/hr over 30 Minutes Intravenous Once 11/19/20 1954 11/19/20 2202        Time Spent in minutes  30   Lala Lund M.D on 11/21/2020 at 8:59 AM  To page go to [email or web address]   [facility] -  Office  [phone]    See all Orders from today for further details    Objective:   Vitals:   11/20/20 2354 11/21/20 0000 11/21/20 0327 11/21/20 0737  BP: 140/73 127/69 (!) 119/57 (!) 105/55  Pulse: 62 (!) 58 60 (!) 54  Resp: 18 18 19 17   Temp: 98.1 F (36.7 C) 98.4 F (36.9 C) 98 F (36.7 C) 98 F (36.7 C)  TempSrc: Oral Oral Axillary Axillary  SpO2: 97% 95% 97% 97%  Weight:   66.4 kg   Height:        Wt Readings from Last 3 Encounters:  11/21/20 66.4 kg  11/10/20 64.5 kg  10/13/20 64 kg     Intake/Output Summary (Last 24 hours) at 11/21/2020 0859 Last data filed at 11/21/2020 0600 Gross per 24 hour  Intake 697 ml  Output 700 ml  Net -3 ml     Physical Exam  Awake Alert, No new F.N deficits, Normal affect [place].AT,PERRAL Supple Neck,No JVD, No cervical lymphadenopathy appriciated.  Symmetrical Chest wall movement, Good air movement bilaterally, CTAB RRR,No Gallops, Rubs or new Murmurs, No Parasternal Heave +ve B.Sounds, Abd Soft, No tenderness, No organomegaly appriciated, No rebound  - guarding or rigidity. No Cyanosis, Clubbing or edema, No new Rash or bruise      Data Review:    CBC Recent Labs  Lab 11/19/20 0020 11/19/20 2011 11/20/20 0132 11/21/20 0033  WBC 5.4 3.0* 3.2* 3.5*  HGB 12.8 13.2 12.6 14.6  HCT 36.2 36.2 36.4 42.7  PLT 200 120* 164 210  MCV 88.1 85.4 87.9 89.0  MCH 31.1 31.1 30.4 30.4  MCHC 35.4 36.5* 34.6 34.2  RDW 12.3 12.2 12.5 12.6  LYMPHSABS 0.4*  --   --  1.6  MONOABS 1.0  --   --  0.7  EOSABS 0.0  --   --  0.0  BASOSABS 0.0  --   --  0.0    Recent Labs  Lab 11/19/20 0020 11/19/20 2011 11/19/20 2019 11/20/20 0132 11/21/20 0033  NA 127* 131*  --  130* 132*  K 3.5 3.5  --  2.8* 3.6  CL 97* 104  --  103 100  CO2 23 20*  --  21* 22  GLUCOSE 137* 86  --  99 94  BUN 8 7*  --  6* 10  CREATININE 0.60 0.68  --  0.68 0.71  CALCIUM 8.3* 8.3*  --  8.2* 8.5*  AST 21 25  --  21 29  ALT 18 15  --  18 21  ALKPHOS 58 52  --  52 55  BILITOT 0.4 0.6  --  0.5 0.5  ALBUMIN 4.1 3.1*  --  3.2* 3.5  MG  --  2.0  --  1.9 1.9  CRP  --  <0.5  --  0.6 0.8  DDIMER  --  0.61*  --  0.63* 0.72*  PROCALCITON  --  <0.10  --   --   --   TSH  --   --  1.448  --   --   BNP  --   --   --   --  94.9    ------------------------------------------------------------------------------------------------------------------ No results for input(s): CHOL, HDL, LDLCALC, TRIG, CHOLHDL, LDLDIRECT in the last 72 hours.  No results found for: HGBA1C ------------------------------------------------------------------------------------------------------------------ Recent Labs    11/19/20 2019  TSH 1.448    Cardiac Enzymes No results for input(s): CKMB, TROPONINI,  MYOGLOBIN in the last 168 hours.  Invalid input(s): CK ------------------------------------------------------------------------------------------------------------------    Component Value Date/Time   BNP 94.9 11/21/2020 0033    Micro Results Recent Results (from the past 240 hour(s))  Resp  Panel by RT-PCR (Flu A&B, Covid) Nasopharyngeal Swab     Status: Abnormal   Collection Time: 11/19/20  1:25 AM   Specimen: Nasopharyngeal Swab; Nasopharyngeal(NP) swabs in vial transport medium  Result Value Ref Range Status   SARS Coronavirus 2 by RT PCR POSITIVE (A) NEGATIVE Final    Comment: RESULT CALLED TO, READ BACK BY AND VERIFIED WITH: KELLIE NEAL,RN ON 11/19/20 AT 0210 BY JAG (NOTE) SARS-CoV-2 target nucleic acids are DETECTED.  The SARS-CoV-2 RNA is generally detectable in upper respiratory specimens during the acute phase of infection. Positive results are indicative of the presence of the identified virus, but do not rule out bacterial infection or co-infection with other pathogens not detected by the test. Clinical correlation with patient history and other diagnostic information is necessary to determine patient infection status. The expected result is Negative.  Fact Sheet for Patients: EntrepreneurPulse.com.au  Fact Sheet for Healthcare Providers: IncredibleEmployment.be  This test is not yet approved or cleared by the [place] FDA and  has been authorized for detection and/or diagnosis of SARS-CoV-2 by FDA under an Emergency Use Authorization (EUA).  This EUA will remain in effect (meaning this tes t can be used) for the duration of  the COVID-19 declaration under Section 564(b)(1) of the Act, 21 U.S.C. section 360bbb-3(b)(1), unless the authorization is terminated or revoked sooner.     Influenza A by PCR NEGATIVE NEGATIVE Final   Influenza B by PCR NEGATIVE NEGATIVE Final    Comment: (NOTE) The Xpert Xpress SARS-CoV-2/FLU/RSV plus assay is intended as an aid in the diagnosis of influenza from Nasopharyngeal swab specimens and should not be used as a sole basis for treatment. Nasal washings and aspirates are unacceptable for Xpert Xpress SARS-CoV-2/FLU/RSV testing.  Fact Sheet for  Patients: EntrepreneurPulse.com.au  Fact Sheet for Healthcare Providers: IncredibleEmployment.be  This test is not yet approved or cleared by the [place] FDA and has been authorized for detection and/or diagnosis of SARS-CoV-2 by FDA under an Emergency Use Authorization (EUA). This EUA will remain in effect (meaning this test can be used) for the duration of the COVID-19 declaration under Section 564(b)(1) of the Act, 21 U.S.C. section 360bbb-3(b)(1), unless the authorization is terminated or revoked.  Performed at [facility], [address], [place], [place] [address]     Radiology Reports DG Chest [place] 1 View  Result Date: 11/20/2020 CLINICAL DATA:  Shortness of breath.  COVID positive EXAM: PORTABLE CHEST 1 VIEW COMPARISON:  Yesterday FINDINGS: Normal heart size and mediastinal contours. No acute infiltrate or edema. No effusion or pneumothorax. No acute osseous findings. Artifact from EKG leads IMPRESSION: Stable, clear chest. Electronically Signed   By: Monte Fantasia M.D.   On: 11/20/2020 08:32   DG Chest Port 1 View  Result Date: 11/19/2020 CLINICAL DATA:  Shortness of breath. COVID positive. Vomiting and diarrhea. EXAM: PORTABLE CHEST 1 VIEW COMPARISON:  05/11/2020 FINDINGS: The heart size and mediastinal contours are within normal limits. Both lungs are clear. The visualized skeletal structures are unremarkable. IMPRESSION: No active disease. Electronically Signed   By: Lucienne Capers M.D.   On: 11/19/2020 01:47

## 2020-11-21 NOTE — Progress Notes (Signed)
Dr. Hal Hope was notified that the patient had a sudden SOB/coughing episode.  Respiratory rate did not increase drastically, and oxygen saturation held in mid to upper nineties.  Robutussin was given earlier with no visible improvement.  Respiratory Therapy ordered a nebulizer treatment which was given.  The patient's status improved.  Dr. Hal Hope ordered tussionex to be given.  Will administer and continue to monitor patient progress.

## 2020-11-22 ENCOUNTER — Telehealth: Payer: Self-pay | Admitting: Family Medicine

## 2020-11-22 ENCOUNTER — Encounter (INDEPENDENT_AMBULATORY_CARE_PROVIDER_SITE_OTHER): Payer: Self-pay

## 2020-11-22 LAB — C-REACTIVE PROTEIN: CRP: 1.3 mg/dL — ABNORMAL HIGH (ref <1.0)

## 2020-11-22 LAB — COMPREHENSIVE METABOLIC PANEL
ALT: 19 U/L (ref 0–44)
AST: 25 U/L (ref 15–41)
Albumin: 2.9 g/dL — ABNORMAL LOW (ref 3.5–5.0)
Alkaline Phosphatase: 44 U/L (ref 38–126)
Anion gap: 4 — ABNORMAL LOW (ref 5–15)
BUN: 9 mg/dL (ref 8–23)
CO2: 27 mmol/L (ref 22–32)
Calcium: 8.2 mg/dL — ABNORMAL LOW (ref 8.9–10.3)
Chloride: 103 mmol/L (ref 98–111)
Creatinine, Ser: 0.69 mg/dL (ref 0.44–1.00)
GFR, Estimated: >60 mL/min (ref >60)
Glucose, Bld: 105 mg/dL — ABNORMAL HIGH (ref 70–99)
Potassium: 3.7 mmol/L (ref 3.5–5.1)
Sodium: 134 mmol/L — ABNORMAL LOW (ref 135–145)
Total Bilirubin: 0.5 mg/dL (ref 0.3–1.2)
Total Protein: 5.1 g/dL — ABNORMAL LOW (ref 6.5–8.1)

## 2020-11-22 LAB — CBC WITH DIFFERENTIAL/PLATELET
Abs Immature Granulocytes: 0.01 10*3/uL (ref 0.00–0.07)
Basophils Absolute: 0 10*3/uL (ref 0.0–0.1)
Basophils Relative: 0 %
Eosinophils Absolute: 0 10*3/uL (ref 0.0–0.5)
Eosinophils Relative: 1 %
HCT: 37.9 % (ref 36.0–46.0)
Hemoglobin: 12.8 g/dL (ref 12.0–15.0)
Immature Granulocytes: 0 %
Lymphocytes Relative: 59 %
Lymphs Abs: 1.5 10*3/uL (ref 0.7–4.0)
MCH: 30.3 pg (ref 26.0–34.0)
MCHC: 33.8 g/dL (ref 30.0–36.0)
MCV: 89.6 fL (ref 80.0–100.0)
Monocytes Absolute: 0.4 10*3/uL (ref 0.1–1.0)
Monocytes Relative: 16 %
Neutro Abs: 0.6 10*3/uL — ABNORMAL LOW (ref 1.7–7.7)
Neutrophils Relative %: 24 %
Platelets: 164 10*3/uL (ref 150–400)
RBC: 4.23 MIL/uL (ref 3.87–5.11)
RDW: 12.6 % (ref 11.5–15.5)
WBC: 2.6 10*3/uL — ABNORMAL LOW (ref 4.0–10.5)
nRBC: 0 % (ref 0.0–0.2)

## 2020-11-22 LAB — MAGNESIUM: Magnesium: 2 mg/dL (ref 1.7–2.4)

## 2020-11-22 LAB — D-DIMER, QUANTITATIVE: D-Dimer, Quant: 0.34 ug/mL-FEU (ref 0.00–0.50)

## 2020-11-22 LAB — BRAIN NATRIURETIC PEPTIDE: B Natriuretic Peptide: 27.2 pg/mL (ref 0.0–100.0)

## 2020-11-22 NOTE — Discharge Instructions (Signed)
Follow with Primary MD Shelda Pal, DO in 7 days   Get CBC, CMP, 2 view Chest X ray -  checked next visit within 1 week by Primary MD    Activity: As tolerated with Full fall precautions use walker/cane & assistance as needed  Disposition Home    Diet: Heart Healthy    Special Instructions: If you have smoked or chewed Tobacco  in the last 2 yrs please stop smoking, stop any regular Alcohol  and or any Recreational drug use.  On your next visit with your primary care physician please Get Medicines reviewed and adjusted.  Please request your Prim.MD to go over all Hospital Tests and Procedure/Radiological results at the follow up, please get all Hospital records sent to your Prim MD by signing hospital release before you go home.  If you experience worsening of your admission symptoms, develop shortness of breath, life threatening emergency, suicidal or homicidal thoughts you must seek medical attention immediately by calling 911 or calling your MD immediately  if symptoms less severe.  You Must read complete instructions/literature along with all the possible adverse reactions/side effects for all the Medicines you take and that have been prescribed to you. Take any new Medicines after you have completely understood and accpet all the possible adverse reactions/side effects.

## 2020-11-22 NOTE — Progress Notes (Signed)
Occupational Therapy Treatment Patient Details Name: Kitt Minardi MRN: [id number] DOB: 12-10-1950 Today's Date: 11/22/2020    History of present illness Maame Dack is a 70 y.o. female who is admitted on 11/19/2020 with COVID-19 infection, N/V, hypokalemia and dehydration.  Pt with significant PMH for mild intermittent asthma, allergic rhinitis, chronic diarrhea, acquired hypothyroidism, migraines.   OT comments  Pt received in bed, agreeable to OT session. Pt continues to require minguard for functional mobility due to c/o dizziness BP 135/77. Pt completed grooming while standing at sink level and voided on commode. Discussed fall prevention strategies and energy conservation strategies for increased safety and independence with ADL/IADL and functional mobility in the home environment. Pt will continue to benefit from skilled OT services to maximize safety and independence with ADL/IADL and functional mobility. Will continue to follow acutely and progress as tolerated.    Follow Up Recommendations  No OT follow up;Supervision - Intermittent    Equipment Recommendations  None recommended by OT    Recommendations for Other Services      Precautions / Restrictions Precautions Precautions: Fall       Mobility Bed Mobility Overal bed mobility: Modified Independent             General bed mobility comments: HOB mildly elevated light use of railing to return to bed from sitting    Transfers Overall transfer level: Needs assistance Equipment used: 1 person hand held assist Transfers: Sit to/from Stand Sit to Stand: Min guard         General transfer comment: Min guard assist for safety and balance    Balance Overall balance assessment: Mild deficits observed, not formally tested                                         ADL either performed or assessed with clinical judgement   ADL Overall ADL's : Needs assistance/impaired Eating/Feeding: Independent    Grooming: Supervision/safety;Standing Grooming Details (indicate cue type and reason): completed at sink             Lower Body Dressing: Supervision/safety;Sit to/from stand   Toilet Transfer: Min guard;Ambulation   Toileting- Clothing Manipulation and Hygiene: Supervision/safety;Sit to/from stand Toileting - Clothing Manipulation Details (indicate cue type and reason): pt voided during session     Functional mobility during ADLs: Min guard General ADL Comments: minguard for safety     Vision       Perception     Praxis      Cognition Arousal/Alertness: Awake/alert Behavior During Therapy: WFL for tasks assessed/performed Overall Cognitive Status: Within Functional Limits for tasks assessed                                          Exercises     Shoulder Instructions       General Comments pt reporting dizziness during session BP 135/77continued education on use of fixation points to reduce dizziness with positional changes    Pertinent Vitals/ Pain       Pain Assessment: 0-10 Pain Intervention(s): Monitored during session  Home Living  Prior Functioning/Environment              Frequency  Min 2X/week        Progress Toward Goals  OT Goals(current goals can now be found in the care plan section)  Progress towards OT goals: Progressing toward goals  Acute Rehab OT Goals Patient Stated Goal: to get stronger and return home OT Goal Formulation: With patient Time For Goal Achievement: 12/04/20 Potential to Achieve Goals: Good ADL Goals Pt Will Perform Grooming: Independently;standing Pt Will Perform Lower Body Dressing: Independently;sit to/from stand Pt Will Transfer to Toilet: Independently;ambulating  Plan Discharge plan remains appropriate    Co-evaluation                 AM-PAC OT "6 Clicks" Daily Activity     Outcome Measure   Help from another  person eating meals?: None Help from another person taking care of personal grooming?: A Little Help from another person toileting, which includes using toliet, bedpan, or urinal?: A Little Help from another person bathing (including washing, rinsing, drying)?: A Little Help from another person to put on and taking off regular upper body clothing?: None Help from another person to put on and taking off regular lower body clothing?: A Little 6 Click Score: 20    End of Session Equipment Utilized During Treatment: Gait belt  OT Visit Diagnosis: Unsteadiness on feet (R26.81);Other abnormalities of gait and mobility (R26.89)   Activity Tolerance Patient tolerated treatment well   Patient Left in chair;with call bell/phone within reach;with chair alarm set   Nurse Communication Mobility status        Time: 7588-3254 OT Time Calculation (min): 36 min  Charges: OT General Charges $OT Visit: 1 Visit OT Treatments $Self Care/Home Management : 23-37 mins  Helene Kelp OTR/L Acute Rehabilitation Services Office: [place] 11/22/2020, 12:21 PM

## 2020-11-22 NOTE — Plan of Care (Signed)
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
## 2020-11-22 NOTE — Discharge Summary (Signed)
Jenna Campbell PJK:[id number] DOB: 09-21-1950 DOA: 11/19/2020  PCP: Shelda Pal, DO  Admit date: 11/19/2020  Discharge date: 11/22/2020  Admitted From: Home  Disposition:  Home   Recommendations for Outpatient Follow-up:   Follow up with PCP in 1-2 weeks  PCP Please obtain BMP/CBC, 2 view CXR in 1week,  (see Discharge instructions)   PCP Please follow up on the following pending results:    Home Health: None   Equipment/Devices: None  Consultations: None  Discharge Condition: Stable    CODE STATUS: Full    Diet Recommendation: Heart Healthy   Diet Order             Diet - low sodium heart healthy           Diet regular Room service appropriate? Yes; Fluid consistency: Thin  Diet effective now                    Chief Complaint  Patient presents with   Vomiting     Brief history of present illness from the day of admission and additional interim summary    Jenna Campbell is a 70 y.o. female with medical history significant for mild intermittent asthma, allergic rhinitis, chronic diarrhea, acquired hypothyroidism, migraines who is admitted to [facility] on 11/19/2020 by way of transfer from [facility] emergency department with COVID-19 infection after presenting from home to [facility] ED complaining of nausea and vomiting, she was diagnosed with dehydration, hyponatremia and generalized weakness and kept in the hospital.                                                                 Hospital Course     Acute COVID-19 infection in a patient who is fully vaccinated and boosted - she had no pulmonary involvement, she had developed some gastroenteritis from COVID-19 causing dehydration and hypokalemia.  She received hydration with IV fluids along with potassium replacement, clinically  much improved and symptom-free now eager to go home, will be discharged home with close outpatient PCP follow-up.  2.  Hypothyroidism.  On Synthroid stable TSH.   3.  GERD.  On PPI.   4.  Dehydration causing hyponatremia and severe hypokalemia.  Aggressively replaced potassium, has received adequate IV fluids for hyponatremia and dehydration.  All improved PCP to recheck CBC, CMP and a two-view chest x-ray in 7 to 10 days.   5. Asthma - no wheezing, supportive Rx.    Discharge diagnosis     Principal Problem:   COVID-19 virus infection Active Problems:   Mild persistent asthma   Other allergic rhinitis   Gastroesophageal reflux disease without esophagitis   Hypothyroidism   Nausea vomiting and diarrhea   SOB (shortness of breath)   Acute hyponatremia    Discharge  instructions    Discharge Instructions     Diet - low sodium heart healthy   Complete by: As directed    Discharge instructions   Complete by: As directed    Follow with Primary MD Shelda Pal, DO in 7 days   Get CBC, CMP, 2 view Chest X ray -  checked next visit within 1 week by Primary MD    Activity: As tolerated with Full fall precautions use walker/cane & assistance as needed  Disposition Home    Diet: Heart Healthy    Special Instructions: If you have smoked or chewed Tobacco  in the last 2 yrs please stop smoking, stop any regular Alcohol  and or any Recreational drug use.  On your next visit with your primary care physician please Get Medicines reviewed and adjusted.  Please request your Prim.MD to go over all Hospital Tests and Procedure/Radiological results at the follow up, please get all Hospital records sent to your Prim MD by signing hospital release before you go home.  If you experience worsening of your admission symptoms, develop shortness of breath, life threatening emergency, suicidal or homicidal thoughts you must seek medical attention immediately by calling 911 or calling  your MD immediately  if symptoms less severe.  You Must read complete instructions/literature along with all the possible adverse reactions/side effects for all the Medicines you take and that have been prescribed to you. Take any new Medicines after you have completely understood and accpet all the possible adverse reactions/side effects.   Increase activity slowly   Complete by: As directed    MyChart COVID-19 home monitoring program   Complete by: Nov 22, 2020    Is the patient willing to use the [place] for home monitoring?: Yes   Temperature monitoring   Complete by: Nov 22, 2020    After how many days would you like to receive a notification of this patient's flowsheet entries?: 1       Discharge Medications   Allergies as of 11/22/2020       Reactions   Iodinated Diagnostic Agents Itching, Other (See Comments), Rash, Shortness Of Breath   Throat Swelling, Erythema   Kenalog [triamcinolone Acetonide] Swelling   Metrizamide Itching, Other (See Comments), Rash, Shortness Of Breath   Throat Swelling, Erythema   Other Itching, Swelling   DUST, spicy food, red sause   Penicillins Anaphylaxis, Itching   Crestor [rosuvastatin] Other (See Comments)   myalgia   Lipitor [atorvastatin] Other (See Comments)   myalgia   Montelukast Sodium Other (See Comments)   Chest pain, dizziness and lightheadedness   Xyzal [levocetirizine]    Chest pressure   Ciprofloxacin Itching   Diazepam Rash   Latex Itching, Rash, Swelling   Sulfa Antibiotics Rash        Medication List     TAKE these medications    acetaminophen 500 MG tablet Commonly known as: TYLENOL Take 500 mg by mouth every 6 (six) hours as needed for moderate pain.   albuterol 108 (90 Base) MCG/ACT inhaler Commonly known as: ProAir HFA Inhale 2 puffs into the lungs every 4 (four) hours as needed for wheezing or shortness of breath. What changed: Another medication with the same name was changed. Make sure you  understand how and when to take each.   albuterol (2.5 MG/3ML) 0.083% nebulizer solution Commonly known as: PROVENTIL INHALE 3 ML BY NEBULIZATION EVERY 6 HOURS AS NEEDED FOR WHEEZING OR SHORTNESS OF BREATH What changed: See the  new instructions.   ASPIRIN 81 PO Take 81 mg by mouth daily.   azelastine 0.1 % nasal spray Commonly known as: ASTELIN Place 2 sprays into both nostrils 2 (two) times daily. Use in each nostril as directed What changed: additional instructions   betamethasone valerate 0.1 % cream Commonly known as: VALISONE Apply 1 application topically daily.   cholecalciferol 25 MCG (1000 UNIT) tablet Commonly known as: VITAMIN D3 Take 1,000 Units by mouth daily.   Co Q-10 200 MG Caps Take 200 mg by mouth daily.   fluticasone 50 MCG/ACT nasal spray Commonly known as: FLONASE Place 2 sprays into both nostrils daily.   fluticasone-salmeterol 250-50 MCG/ACT Aepb Commonly known as: Advair Diskus TAKE 1 PUFF BY MOUTH TWICE A DAY What changed:  how much to take how to take this when to take this additional instructions   gabapentin 300 MG capsule Commonly known as: NEURONTIN Take 300 mg by mouth in the morning.   ibuprofen 200 MG tablet Commonly known as: ADVIL Take 400 mg by mouth every 6 (six) hours as needed for mild pain.   isosorbide mononitrate 30 MG 24 hr tablet Commonly known as: IMDUR Take 0.5 tablets (15 mg total) by mouth daily.   levocetirizine 5 MG tablet Commonly known as: XYZAL Take 5 mg by mouth daily.   levothyroxine 150 MCG tablet Commonly known as: SYNTHROID Take 1.5 tablets (225 mcg total) by mouth daily before breakfast.   Livalo 2 MG Tabs Generic drug: Pitavastatin Calcium Take 0.5 tablets (1 mg total) by mouth daily.   LORazepam 0.5 MG tablet Commonly known as: ATIVAN Take 0.5 mg by mouth every 8 (eight) hours as needed for anxiety.   nitroGLYCERIN 0.4 MG SL tablet Commonly known as: NITROSTAT Place 0.4 mg under the tongue  every 5 (five) minutes as needed for chest pain.   olopatadine 0.1 % ophthalmic solution Commonly known as: Patanol Place 1 drop into both eyes 2 (two) times daily. What changed: when to take this   pantoprazole 40 MG tablet Commonly known as: PROTONIX Take 1 tablet (40 mg total) by mouth 2 (two) times daily.   temazepam 15 MG capsule Commonly known as: RESTORIL Take 15 mg by mouth at bedtime.   topiramate 100 MG tablet Commonly known as: TOPAMAX Take 100 mg by mouth at bedtime.   valACYclovir 1000 MG tablet Commonly known as: VALTREX Take 1 tab daily for 5 days during outbreaks. What changed:  how much to take how to take this when to take this reasons to take this additional instructions         Follow-up Information     Shelda Pal, DO. Schedule an appointment as soon as possible for a visit in 1 week(s).   Specialty: Family Medicine Contact information: [address] [place] [address] [phone]         Revankar, Reita Cliche, MD. Schedule an appointment as soon as possible for a visit in 1 week(s).   Specialty: Cardiology Contact information: [address]  [place] [address] [phone]                 Major procedures and Radiology Reports - PLEASE review detailed and final reports thoroughly  -      DG Chest Port 1 View  Result Date: 11/20/2020 CLINICAL DATA:  Shortness of breath.  COVID positive EXAM: PORTABLE CHEST 1 VIEW COMPARISON:  Yesterday FINDINGS: Normal heart size and mediastinal contours.  No acute infiltrate or edema. No effusion or pneumothorax. No acute osseous findings. Artifact from EKG leads IMPRESSION: Stable, clear chest. Electronically Signed   By: Monte Fantasia M.D.   On: 11/20/2020 08:32   DG Chest Port 1 View  Result Date: 11/19/2020 CLINICAL DATA:  Shortness of breath. COVID positive. Vomiting and diarrhea. EXAM: PORTABLE CHEST 1 VIEW COMPARISON:  05/11/2020  FINDINGS: The heart size and mediastinal contours are within normal limits. Both lungs are clear. The visualized skeletal structures are unremarkable. IMPRESSION: No active disease. Electronically Signed   By: Lucienne Capers M.D.   On: 11/19/2020 01:47    Micro Results    Recent Results (from the past 240 hour(s))  Resp Panel by RT-PCR (Flu A&B, Covid) Nasopharyngeal Swab     Status: Abnormal   Collection Time: 11/19/20  1:25 AM   Specimen: Nasopharyngeal Swab; Nasopharyngeal(NP) swabs in vial transport medium  Result Value Ref Range Status   SARS Coronavirus 2 by RT PCR POSITIVE (A) NEGATIVE Final    Comment: RESULT CALLED TO, READ BACK BY AND VERIFIED WITH: KELLIE NEAL,RN ON 11/19/20 AT 0210 BY JAG (NOTE) SARS-CoV-2 target nucleic acids are DETECTED.  The SARS-CoV-2 RNA is generally detectable in upper respiratory specimens during the acute phase of infection. Positive results are indicative of the presence of the identified virus, but do not rule out bacterial infection or co-infection with other pathogens not detected by the test. Clinical correlation with patient history and other diagnostic information is necessary to determine patient infection status. The expected result is Negative.  Fact Sheet for Patients: EntrepreneurPulse.com.au  Fact Sheet for Healthcare Providers: IncredibleEmployment.be  This test is not yet approved or cleared by the [place] FDA and  has been authorized for detection and/or diagnosis of SARS-CoV-2 by FDA under an Emergency Use Authorization (EUA).  This EUA will remain in effect (meaning this tes t can be used) for the duration of  the COVID-19 declaration under Section 564(b)(1) of the Act, 21 U.S.C. section 360bbb-3(b)(1), unless the authorization is terminated or revoked sooner.     Influenza A by PCR NEGATIVE NEGATIVE Final   Influenza B by PCR NEGATIVE NEGATIVE Final    Comment: (NOTE) The  Xpert Xpress SARS-CoV-2/FLU/RSV plus assay is intended as an aid in the diagnosis of influenza from Nasopharyngeal swab specimens and should not be used as a sole basis for treatment. Nasal washings and aspirates are unacceptable for Xpert Xpress SARS-CoV-2/FLU/RSV testing.  Fact Sheet for Patients: EntrepreneurPulse.com.au  Fact Sheet for Healthcare Providers: IncredibleEmployment.be  This test is not yet approved or cleared by the [place] FDA and has been authorized for detection and/or diagnosis of SARS-CoV-2 by FDA under an Emergency Use Authorization (EUA). This EUA will remain in effect (meaning this test can be used) for the duration of the COVID-19 declaration under Section 564(b)(1) of the Act, 21 U.S.C. section 360bbb-3(b)(1), unless the authorization is terminated or revoked.  Performed at [facility], [address] [place], [place] [address]     Today   Subjective    Jenna Campbell today has no headache,no chest abdominal pain,no new weakness tingling or numbness, feels much better wants to go home today.    Objective   Blood pressure 109/65, pulse 60, temperature 97.6 F (36.4 C), temperature source Oral, resp. rate 15, height 5\' 6"  (1.676 m), weight 64 kg, SpO2 95 %.   Intake/Output Summary (Last 24 hours) at 11/22/2020 1008 Last data filed at 11/21/2020 2357 Gross  per 24 hour  Intake 930.7 ml  Output --  Net 930.7 ml    Exam  Awake Alert, No new F.N deficits, Normal affect [place].AT,PERRAL Supple Neck,No JVD, No cervical lymphadenopathy appriciated.  Symmetrical Chest wall movement, Good air movement bilaterally, CTAB RRR,No Gallops,Rubs or new Murmurs, No Parasternal Heave +ve B.Sounds, Abd Soft, Non tender, No organomegaly appriciated, No rebound -guarding or rigidity. No Cyanosis, Clubbing or edema, No new Rash or bruise   Data Review   CBC w Diff:  Lab Results  Component Value Date   WBC  2.6 (L) 11/22/2020   HGB 12.8 11/22/2020   HGB 13.5 05/20/2020   HCT 37.9 11/22/2020   HCT 40.9 05/20/2020   PLT 164 11/22/2020   PLT 256 05/20/2020   LYMPHOPCT 59 11/22/2020   MONOPCT 16 11/22/2020   EOSPCT 1 11/22/2020   BASOPCT 0 11/22/2020    CMP:  Lab Results  Component Value Date   NA 134 (L) 11/22/2020   NA 138 10/13/2020   K 3.7 11/22/2020   CL 103 11/22/2020   CO2 27 11/22/2020   BUN 9 11/22/2020   BUN 12 10/13/2020   CREATININE 0.69 11/22/2020   PROT 5.1 (L) 11/22/2020   PROT 6.5 10/13/2020   ALBUMIN 2.9 (L) 11/22/2020   ALBUMIN 4.0 10/13/2020   BILITOT 0.5 11/22/2020   BILITOT 0.4 10/13/2020   ALKPHOS 44 11/22/2020   AST 25 11/22/2020   ALT 19 11/22/2020  .   Total Time in preparing paper work, data evaluation and todays exam - 35 minutes  Lala Lund M.D on 11/22/2020 at 10:08 AM  [facility]

## 2020-11-22 NOTE — Telephone Encounter (Signed)
Pt set off BPA on COVID questionnaire for weakness. States she has been in the hospital for 4 nights. Discussed deconditioning from hospitalization. Reviewed self care at home. Verbalized understanding. She has new inhalers and has some questions as to if she is supposed to take her old ones as well as the new ones ordered in hospital. Reviewed what inhalers were duplicates. She voiced that there were some she prefers to others. She is to call back during office hours to discuss if she can stop taking some of her inhalers that are ordered. Explained that only the doctor to advise on that. Verbalized understanding.

## 2020-11-23 ENCOUNTER — Telehealth: Payer: Self-pay

## 2020-11-23 LAB — PATHOLOGIST SMEAR REVIEW

## 2020-11-23 NOTE — Telephone Encounter (Signed)
Called back and changed appointment Friday 11/26/20 to 11:15 AM. Arrive at 11 AM.

## 2020-11-23 NOTE — Telephone Encounter (Signed)
Called the patient to schedule appointment with PCP. Scheduled appt for this Friday 11/26/2020 at 3:15.

## 2020-11-23 NOTE — Telephone Encounter (Signed)
TCM team should reach out, but please make sure she has an appt within the next week. Ty.

## 2020-11-23 NOTE — Telephone Encounter (Signed)
Transition Care Management Follow-up Telephone Call Date of discharge and from where: 11/22/2020-[facility] How have you been since you were released from the hospital? Doing ok.but still tired & congested. Any questions or concerns? No  Items Reviewed: Did the pt receive and understand the discharge instructions provided? Yes  Medications obtained and verified? Yes  Other? Yes  Any new allergies since your discharge? No  Dietary orders reviewed? Yes Do you have support at home? Yes   Home Care and Equipment/Supplies: Were home health services ordered? no If so, what is the name of the agency? N/a  Has the agency set up a time to come to the patient's home? not applicable Were any new equipment or medical supplies ordered?  No What is the name of the medical supply agency? N/a Were you able to get the supplies/equipment? not applicable Do you have any questions related to the use of the equipment or supplies? N/a  Functional Questionnaire: (I = Independent and D = Dependent) ADLs: I with assistance  Bathing/Dressing- I  Meal Prep- D  Eating- I  Maintaining continence- I  Transferring/Ambulation- I with walker  Managing Meds- I  Follow up appointments reviewed:  PCP Hospital f/u appt confirmed? Yes  Scheduled to see Dr. Nani Ravens on 11/26/2020 @ 11:15. [facility] f/u appt confirmed? Yes  Scheduled to see Dr. Geraldo Pitter on 12/06/20 @ 10:20. Are transportation arrangements needed? No  If their condition worsens, is the pt aware to call PCP or go to the Emergency Dept.? Yes Was the patient provided with contact information for the PCP's office or ED? Yes Was to pt encouraged to call back with questions or concerns? Yes

## 2020-11-25 ENCOUNTER — Telehealth: Payer: Self-pay

## 2020-11-25 ENCOUNTER — Telehealth: Payer: Self-pay | Admitting: *Deleted

## 2020-11-25 NOTE — Telephone Encounter (Signed)
Patient returning call from [employer].Continues to have sore throat, cough not improving and with chest congestion. Advice including try warm tea with honey for throat and cough. She has cough medication she can try at night. Mucinex and increased water intake to assist the chest congestion along with deep breathing exercises thru out the day. Reminded Jenna Campbell that if her breathing worsens to call 911 or go to the ED. She has appointment with her pcp tomorrow.

## 2020-11-25 NOTE — Telephone Encounter (Signed)
Vm left for patient to callback regarding questionnaire. Mychart message has been sent as well with protocol.

## 2020-11-26 ENCOUNTER — Ambulatory Visit (INDEPENDENT_AMBULATORY_CARE_PROVIDER_SITE_OTHER): Payer: PPO | Admitting: Family Medicine

## 2020-11-26 ENCOUNTER — Encounter: Payer: Self-pay | Admitting: Family Medicine

## 2020-11-26 ENCOUNTER — Ambulatory Visit (HOSPITAL_BASED_OUTPATIENT_CLINIC_OR_DEPARTMENT_OTHER)
Admission: RE | Admit: 2020-11-26 | Discharge: 2020-11-26 | Disposition: A | Discharge status: Home / Self Care | Payer: PPO | Source: Ambulatory Visit | Attending: Family Medicine | Admitting: Family Medicine

## 2020-11-26 ENCOUNTER — Other Ambulatory Visit: Payer: Self-pay

## 2020-11-26 ENCOUNTER — Ambulatory Visit: Payer: PPO | Admitting: Family Medicine

## 2020-11-26 VITALS — BP 120/82 | HR 86 | Temp 97.8°F | Ht 66.5 in | Wt 137.4 lb

## 2020-11-26 DIAGNOSIS — E871 Hypo-osmolality and hyponatremia: Secondary | ICD-10-CM | POA: Diagnosis not present

## 2020-11-26 DIAGNOSIS — U071 COVID-19: Secondary | ICD-10-CM

## 2020-11-26 DIAGNOSIS — R059 Cough, unspecified: Secondary | ICD-10-CM

## 2020-11-26 DIAGNOSIS — R0600 Dyspnea, unspecified: Secondary | ICD-10-CM | POA: Diagnosis not present

## 2020-11-26 DIAGNOSIS — J4541 Moderate persistent asthma with (acute) exacerbation: Secondary | ICD-10-CM | POA: Diagnosis not present

## 2020-11-26 DIAGNOSIS — I208 Other forms of angina pectoris: Secondary | ICD-10-CM | POA: Diagnosis not present

## 2020-11-26 DIAGNOSIS — R300 Dysuria: Secondary | ICD-10-CM

## 2020-11-26 DIAGNOSIS — F330 Major depressive disorder, recurrent, mild: Secondary | ICD-10-CM

## 2020-11-26 DIAGNOSIS — I2089 Other forms of angina pectoris: Secondary | ICD-10-CM

## 2020-11-26 DIAGNOSIS — D709 Neutropenia, unspecified: Secondary | ICD-10-CM | POA: Diagnosis not present

## 2020-11-26 LAB — URINALYSIS
Bilirubin Urine: NEGATIVE
Ketones, ur: NEGATIVE
Leukocytes,Ua: NEGATIVE
Nitrite: NEGATIVE
Specific Gravity, Urine: 1.025 (ref 1.000–1.030)
Total Protein, Urine: NEGATIVE
Urine Glucose: NEGATIVE
Urobilinogen, UA: 0.2 (ref 0.0–1.0)
pH: 6 (ref 5.0–8.0)

## 2020-11-26 LAB — CBC WITH DIFFERENTIAL/PLATELET
Basophils Absolute: 0 10*3/uL (ref 0.0–0.1)
Basophils Relative: 0.5 % (ref 0.0–3.0)
Eosinophils Absolute: 0 10*3/uL (ref 0.0–0.7)
Eosinophils Relative: 1.1 % (ref 0.0–5.0)
HCT: 37.1 % (ref 36.0–46.0)
Hemoglobin: 12.7 g/dL (ref 12.0–15.0)
Lymphocytes Relative: 29.5 % (ref 12.0–46.0)
Lymphs Abs: 1.2 10*3/uL (ref 0.7–4.0)
MCHC: 34.1 g/dL (ref 30.0–36.0)
MCV: 90.5 fl (ref 78.0–100.0)
Monocytes Absolute: 0.7 10*3/uL (ref 0.1–1.0)
Monocytes Relative: 17.1 % — ABNORMAL HIGH (ref 3.0–12.0)
Neutro Abs: 2.2 10*3/uL (ref 1.4–7.7)
Neutrophils Relative %: 51.8 % (ref 43.0–77.0)
Platelets: 214 10*3/uL (ref 150.0–400.0)
RBC: 4.1 Mil/uL (ref 3.87–5.11)
RDW: 13 % (ref 11.5–15.5)
WBC: 4.2 10*3/uL (ref 4.0–10.5)

## 2020-11-26 LAB — COMPREHENSIVE METABOLIC PANEL
ALT: 35 U/L (ref 0–35)
AST: 30 U/L (ref 0–37)
Albumin: 3.9 g/dL (ref 3.5–5.2)
Alkaline Phosphatase: 55 U/L (ref 39–117)
BUN: 13 mg/dL (ref 6–23)
CO2: 23 mEq/L (ref 19–32)
Calcium: 8.5 mg/dL (ref 8.4–10.5)
Chloride: 104 mEq/L (ref 96–112)
Creatinine, Ser: 0.7 mg/dL (ref 0.40–1.20)
GFR: 87.39 mL/min (ref >60.00)
Glucose, Bld: 83 mg/dL (ref 70–99)
Potassium: 4.1 mEq/L (ref 3.5–5.1)
Sodium: 135 mEq/L (ref 135–145)
Total Bilirubin: 0.8 mg/dL (ref 0.2–1.2)
Total Protein: 6.2 g/dL (ref 6.0–8.3)

## 2020-11-26 MED ORDER — ONDANSETRON 4 MG PO TBDP: 4.0000 mg | ORAL_TABLET | Freq: Three times a day (TID) | ORAL | 0 refills | Status: DC | PRN

## 2020-11-26 MED ORDER — BENZONATATE 100 MG PO CAPS: 100.0000 mg | ORAL_CAPSULE | Freq: Three times a day (TID) | ORAL | 0 refills | Status: DC | PRN

## 2020-11-26 MED ORDER — PREDNISONE 20 MG PO TABS: 40.0000 mg | ORAL_TABLET | Freq: Every day | ORAL | 0 refills | Status: AC

## 2020-11-26 NOTE — Progress Notes (Signed)
CC: Hosp f/u  HPI Jenna Campbell is a 70 y.o. y.o. female who presents for a transition of care visit.  Pt was discharged from [facility] on 7/11, admitted on 11/19/20.  Within 48 business hours of discharge our office contacted pt via telephone to coordinate care and needs.   Pt was admitted for covid-19 infection that caused gastro s/s's. No longer having diarrhea, stools are more formed. Eating and drinking better. She has developed more coughing and wheezing since d/c. She has been trying to use her inhalers without relief.  Her biggest complaint is a cough.  Tessalon Perles have worked well for various coughing in the past.  She is concerned that she has fluid in her lungs as she did receive a lot of IV fluids.  She was seen a few weeks ago and treated for urinary tract infection.  Urine culture was unremarkable.  She had continued symptoms with pressure in the suprapubic region, dysuria, and frequency.  No bleeding, discharge, or fevers.  Past Medical History:  Diagnosis Date   Acute bronchitis due to other specified organisms 04/21/2015   Acute tension-type headache 12/16/2015   Adjustment disorder with anxious mood 04/21/2015   Allergic asthma with acute exacerbation 09/22/2014   Allergic rhinitis due to pollen 07/23/2014   Allergic urticaria 05/13/2015   Allergy    Alopecia 10/30/2013   Angina pectoris ([place]) 07/29/2019   Arthritis 04/24/2016   Asthma    Atrophic vaginitis 10/30/2013   Basal cell carcinoma 04/21/2015   Bilateral impacted cerumen 04/21/2015   CAD (coronary artery disease) 10/28/2019   Cancer ([place])    skin cancer   Cervical radiculopathy 09/17/2015   Change in bowel function 06/14/2016   Chest pain in adult 05/28/2017   Chest tightness 07/29/2019   Chronic diarrhea 12/16/2015   Colon polyps    Cough 02/14/2016   Cystocele, midline 10/30/2013   Dermatitis 09/22/2014   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   Diverticulosis of large intestine 10/30/2013   Dupuytren's contracture of both hands  09/17/2019   Dyspareunia 07/23/2014   Dysuria 12/16/2015   Encounter for long-term (current) use of other medications 10/19/2012   Esophageal dysphagia 06/14/2016   Essential hypertension    Family history of pheochromocytoma 02/14/2016   Foot pain, bilateral 01/05/2018   Gastroesophageal reflux disease    Gastroesophageal reflux disease without esophagitis 05/13/2015   Generalized abdominal pain 12/16/2015   GERD (gastroesophageal reflux disease)    H/O: hysterectomy    Herpes simplex 08/17/2016   History of adenomatous polyp of colon 06/14/2016   History of blood transfusion 1970   Hx of migraines 05/15/2009   Hypothyroidism 10/30/2013   Hypotonic bladder 10/30/2013   Insomnia 10/11/2017   Localized edema 12/16/2015   Low back pain 09/17/2015   Lumbar paraspinal muscle spasm 04/21/2015   Major depression, recurrent ([place]) 07/14/2013   Major depressive disorder, recurrent, mild ([place]) 03/14/2017   Malaise and fatigue 04/21/2015   MCI (mild cognitive impairment) 08/18/2015   Memory difficulty 09/17/2015   Menopause 03/31/2014   Midline low back pain without sciatica 03/31/2014   Migraine    Mild intermittent extrinsic asthma 12/16/2015   Mild persistent asthma 05/13/2015   Mixed dyslipidemia 11/12/2019   Neck pain 09/17/2015   Neuropathy 09/22/2014   Nonintractable headache 11/02/2016   Other allergic rhinitis 05/13/2015   Palpitation 09/03/2017   Pedal edema 07/29/2019   Pelvic pain in female 02/17/2015   Personality disorder ([place]) 10/19/2012   Pharyngoesophageal dysphagia 04/21/2015   Plantar fasciitis 01/20/2016  Precordial chest pain 08/18/2015   Primary insomnia 10/11/2017   Prolapse of vaginal vault after hysterectomy 10/30/2013   Rectocele 10/30/2013   Seasonal allergies 10/15/2019   Senile purpura ([place]) 06/15/2020   Shin splint 04/21/2015   Shortness of breath 07/17/2016   Sinusitis, chronic 10/30/2013   Slow transit constipation 07/23/2014   Stable angina (HCC) 10/28/2019   Swelling of lower limb 05/17/2016    Tension-type headache, not intractable 09/17/2015   Thyroid disease    Tinnitus of both ears 10/11/2017   Unstable angina ([place]) 07/29/2019   Urethral stricture 10/30/2013   Urinary incontinence    Urinary incontinence without sensory awareness 07/17/2016   Varicose veins of both lower extremities 04/24/2016   Venous insufficiency (chronic) (peripheral) 06/06/2016   Past Surgical History:  Procedure Laterality Date   BREAST CYST ASPIRATION Left    25 years ago    COLONOSCOPY  04/2016   [facility] Diverticulitis and Multiple colon polyps   ESOPHAGOGASTRODUODENOSCOPY  04/2016   [facility]   INTRAVASCULAR PRESSURE WIRE/FFR STUDY N/A 10/17/2019   Procedure: INTRAVASCULAR PRESSURE WIRE/FFR STUDY;  Surgeon: Nelva Bush, MD;  Location: [place] [facility];  Service: Cardiovascular;  Laterality: N/A;   LEFT HEART CATH AND CORONARY ANGIOGRAPHY N/A 10/17/2019   Procedure: LEFT HEART CATH AND CORONARY ANGIOGRAPHY;  Surgeon: Nelva Bush, MD;  Location: [place] [facility];  Service: Cardiovascular;  Laterality: N/A;   MOHS SURGERY  10/05/2015   RECTOCELE REPAIR  2011   SKIN CANCER EXCISION  02/2015   Squamous cell removed from back   TONSILLECTOMY     VAGINAL HYSTERECTOMY     VAGINAL PROLAPSE REPAIR     Family History  Problem Relation Age of Onset   Asthma Father    Angina Father    Emphysema Father    Alzheimer's disease Mother    Cancer Sister        died of cancer   Colon cancer Neg Hx    Esophageal cancer Neg Hx    Allergies as of 11/26/2020       Reactions   Iodinated Diagnostic Agents Itching, Other (See Comments), Rash, Shortness Of Breath   Throat Swelling, Erythema   Kenalog [triamcinolone Acetonide] Swelling   Metrizamide Itching, Other (See Comments), Rash, Shortness Of Breath   Throat Swelling, Erythema   Other Itching, Swelling   DUST, spicy food, red sause   Penicillins Anaphylaxis, Itching   Crestor [rosuvastatin] Other (See Comments)   myalgia   Lipitor  [atorvastatin] Other (See Comments)   myalgia   Montelukast Sodium Other (See Comments)   Chest pain, dizziness and lightheadedness   Xyzal [levocetirizine]    Chest pressure   Ciprofloxacin Itching   Diazepam Rash   Latex Itching, Rash, Swelling   Sulfa Antibiotics Rash        Medication List        Accurate as of November 26, 2020 11:48 AM. If you have any questions, ask your nurse or doctor.          acetaminophen 500 MG tablet Commonly known as: TYLENOL Take 500 mg by mouth every 6 (six) hours as needed for moderate pain.   albuterol 108 (90 Base) MCG/ACT inhaler Commonly known as: ProAir HFA Inhale 2 puffs into the lungs every 4 (four) hours as needed for wheezing or shortness of breath.   albuterol (2.5 MG/3ML) 0.083% nebulizer solution Commonly known as: PROVENTIL INHALE 3 ML BY NEBULIZATION EVERY 6 HOURS AS NEEDED FOR WHEEZING OR  SHORTNESS OF BREATH   ASPIRIN 81 PO Take 81 mg by mouth daily.   azelastine 0.1 % nasal spray Commonly known as: ASTELIN Place 2 sprays into both nostrils 2 (two) times daily. Use in each nostril as directed   benzonatate 100 MG capsule Commonly known as: TESSALON Take 1 capsule (100 mg total) by mouth 3 (three) times daily as needed. Started by: Shelda Pal, DO   betamethasone valerate 0.1 % cream Commonly known as: VALISONE Apply 1 application topically daily.   cholecalciferol 25 MCG (1000 UNIT) tablet Commonly known as: VITAMIN D3 Take 1,000 Units by mouth daily.   Co Q-10 200 MG Caps Take 200 mg by mouth daily.   fluticasone 50 MCG/ACT nasal spray Commonly known as: FLONASE Place 2 sprays into both nostrils daily.   fluticasone-salmeterol 250-50 MCG/ACT Aepb Commonly known as: Advair Diskus TAKE 1 PUFF BY MOUTH TWICE A DAY   gabapentin 300 MG capsule Commonly known as: NEURONTIN Take 300 mg by mouth in the morning.   ibuprofen 200 MG tablet Commonly known as: ADVIL Take 400 mg by mouth every 6 (six)  hours as needed for mild pain.   isosorbide mononitrate 30 MG 24 hr tablet Commonly known as: IMDUR Take 0.5 tablets (15 mg total) by mouth daily.   levocetirizine 5 MG tablet Commonly known as: XYZAL Take 5 mg by mouth daily.   levothyroxine 150 MCG tablet Commonly known as: SYNTHROID Take 1.5 tablets (225 mcg total) by mouth daily before breakfast.   Livalo 2 MG Tabs Generic drug: Pitavastatin Calcium Take 0.5 tablets (1 mg total) by mouth daily.   LORazepam 0.5 MG tablet Commonly known as: ATIVAN Take 0.5 mg by mouth every 8 (eight) hours as needed for anxiety.   nitroGLYCERIN 0.4 MG SL tablet Commonly known as: NITROSTAT Place 0.4 mg under the tongue every 5 (five) minutes as needed for chest pain.   olopatadine 0.1 % ophthalmic solution Commonly known as: Patanol Place 1 drop into both eyes 2 (two) times daily.   ondansetron 4 MG disintegrating tablet Commonly known as: ZOFRAN-ODT Take 1 tablet (4 mg total) by mouth every 8 (eight) hours as needed for nausea or vomiting. Started by: Shelda Pal, DO   pantoprazole 40 MG tablet Commonly known as: PROTONIX Take 1 tablet (40 mg total) by mouth 2 (two) times daily.   predniSONE 20 MG tablet Commonly known as: DELTASONE Take 2 tablets (40 mg total) by mouth daily with breakfast for 5 days. Started by: Shelda Pal, DO   temazepam 15 MG capsule Commonly known as: RESTORIL Take 15 mg by mouth at bedtime.   topiramate 100 MG tablet Commonly known as: TOPAMAX Take 100 mg by mouth at bedtime.   valACYclovir 1000 MG tablet Commonly known as: VALTREX Take 1 tab daily for 5 days during outbreaks.    ROS:  Constitutional: No fevers or chills, no weight loss HEENT: No headaches, hearing loss, or runny nose, no sore throat Heart: No chest pain Lungs: +SOB, wheezing, cough Abd: No bowel changes, no pain, no N/V GU: +dysuria Neuro: No numbness, tingling or weakness Msk: +low back  pain  Objective BP 120/82   Pulse 86   Temp 97.8 F (36.6 C) (Oral)   Ht 5' 6.5" (1.689 m)   Wt 137 lb 6 oz (62.3 kg)   SpO2 97%   BMI 21.84 kg/m  General Appearance:  awake, alert, oriented, in no acute distress and well developed, well nourished Skin:  there  are no suspicious lesions or rashes of concern Head/face:  NCAT Eyes:  EOMI, PERRLA Ears:  canals and TMs NI Nose/Sinuses:  negative Mouth/Throat:  Mucosa moist, no lesions; pharynx without erythema, edema or exudate. Neck:  neck- supple, no mass, non-tender and no jvd Lungs: Clear to auscultation.  No rales, rhonchi, or wheezing. Normal effort, no accessory muscle use. Heart:  Heart sounds are normal.  Regular rate and rhythm without murmur, gallop or rub. No bruits. Abdomen:  BS+, soft, NT, ND, no masses or organomegaly Musculoskeletal:  No muscle group atrophy or asymmetry, gait normal Neurologic:  Alert and oriented x 3, gait normal., reflexes normal and symmetric, strength and  sensation grossly normal Psych exam: Nml mood and affect, age appropriate judgment and insight  Cough - Plan: DG Chest 2 View  Hyponatremia - Plan: Comprehensive metabolic panel  Neutropenia, unspecified type ([place]) - Plan: CBC w/Diff  COVID-19  Moderate persistent asthma with exacerbation - Plan: predniSONE (DELTASONE) 20 MG tablet  Major depressive disorder, recurrent, mild ([place]), Chronic  Stable angina ([place]), Chronic  Dysuria - Plan: Urinalysis, Urine Culture  Discharge summary and medication list have been reviewed/reconciled.  Labs pending at the time of discharge have been reviewed or are still pending at the time of this visit.  Follow-up labs and appointments have been ordered and/or coordinated appropriately.  She has an appt w cardiology in 10 d. She will have f/u labs today and a CXR. 5 d pred burst for asthma flare. Ck urine. I think her neutropenia is transient due to illness.   TRANSITIONAL CARE MANAGEMENT CERTIFICATION:   I certify the following are true:   1. Communication with the patient/care giver was made within 2 business days of discharge.  2. Complexity of Medical decision making is moderate due to new onset resp issues developing that could be related to principle admitting dx.  3. Face to face visit occurred within 14 days of discharge.   F/u pending the above. The patient voiced understanding and agreement to the plan.  [place], DO 11/26/20 11:49 AM

## 2020-11-26 NOTE — Patient Instructions (Addendum)
Give [place] 2-3 business days to get the results of your labs back.   We will be in touch regarding your X-ray.   Continue your inhalers.   Stay hydrated.   Warning signs/symptoms: Uncontrollable nausea/vomiting, fevers, worsening symptoms despite treatment, confusion.  Give [place] around 2 business days to get culture back to you.  Let us know if you need anything.

## 2020-11-28 LAB — URINE CULTURE
MICRO NUMBER:: 12124707
Result:: NO GROWTH
SPECIMEN QUALITY:: ADEQUATE

## 2020-11-30 ENCOUNTER — Encounter (INDEPENDENT_AMBULATORY_CARE_PROVIDER_SITE_OTHER): Payer: Self-pay

## 2020-11-30 ENCOUNTER — Telehealth: Payer: Self-pay

## 2020-11-30 NOTE — Telephone Encounter (Signed)
Spoke with CMA this morning in regard to worsening cough per COVID questionnaire. Questionnaire triggered BPA again.No answer. Left message to call back about symptoms.

## 2020-11-30 NOTE — Telephone Encounter (Signed)
Contacted patient in regards to my chart questionnaire. Patient reports worsening cough. Patient states that she is taking cough syrup at night time. Patient states that she is taking cough drops and drinking plenty of fluids. Patient states that it's a dry cough . Patient also uses a inhaler as well. Patient advised on cough per protocol as follows:   If cough remains the same or better: continue to treat with over the counter medications. Hard candy or cough drops and drinking warm fluids. Adults can also use honey 2 tsp (10 ML) at bedtime.   HONEY IS NOT RECOMMENDED FOR INFANTS UNDER ONE.   If cough is becoming worse even with the use of over the counter medications and patient is not able to sleep at night, cough becomes productive with sputum that maybe yellow or green in color, contact PCP.

## 2020-11-30 NOTE — Telephone Encounter (Signed)
Pt returned call and stated she took another Covid test which was negative. Pt still having cough and stated her throat is sore. Pt requests call back

## 2020-11-30 NOTE — Telephone Encounter (Signed)
Patient called and says her throat is still sore and she has a cough, covid test is negative. She says she was prescribed prednisone by Dr. Nani Ravens and wants to know if that can be prescribed again. I advised she will need to call her provider office to speak to the on call nurse tonight, call in the morning when the office is opened, or do a MyChart e-visit if symptoms are not improving. She verbalized understanding.

## 2020-12-01 ENCOUNTER — Telehealth: Payer: Self-pay | Admitting: *Deleted

## 2020-12-01 NOTE — Telephone Encounter (Signed)
MyChart-  Companion - increased symptoms: Cough- worse Patient is using cough syrup at night- patient has Tessalon pearls. Patient states her ears are clogged and she is hurting with increased sinus pressure. Patient is actively coughing during conversation. Patient states her O2 sat is good-98% and she is not reporting fever at this time. Patient does believe she has secondary infection and will talk to PCP about this- he is supposed to call her after seeing patients today. Advised patient of UC virtual visit if she does not hear back and advised her if she has increased symptoms- trouble breathing, feeling like she is not getting enough air in, SOB when sitting still, dehydration, severe weakness- go to ED. Appetite- worse Nausea is so bad- patient is having trouble eating. Patient states she did take a promethazine she had on hand this afternoon and it has helped a little. Patient is drinking - water, Gatorade, Spite. Patient states she is taking bites of bread. Advised her to continue to drink fluids as tolerated, work up to bland solid food such as crackers, pretzels, soup, bread, boiled starches.Notify provider if unable to tolerate foods or liquids. Copy of this note sent to patient as reference.

## 2020-12-02 ENCOUNTER — Other Ambulatory Visit: Payer: Self-pay | Admitting: Family Medicine

## 2020-12-02 MED ORDER — PROMETHAZINE-PHENYLEPHRINE 6.25-5 MG/5ML PO SYRP: 5.0000 mL | ORAL_SOLUTION | Freq: Four times a day (QID) | ORAL | 0 refills | Status: DC | PRN

## 2020-12-03 ENCOUNTER — Encounter (INDEPENDENT_AMBULATORY_CARE_PROVIDER_SITE_OTHER): Payer: Self-pay

## 2020-12-03 ENCOUNTER — Telehealth: Payer: PPO | Admitting: Physician Assistant

## 2020-12-03 ENCOUNTER — Telehealth: Payer: Self-pay

## 2020-12-03 ENCOUNTER — Other Ambulatory Visit: Payer: Self-pay

## 2020-12-03 DIAGNOSIS — B9689 Other specified bacterial agents as the cause of diseases classified elsewhere: Secondary | ICD-10-CM | POA: Diagnosis not present

## 2020-12-03 DIAGNOSIS — Z8616 Personal history of COVID-19: Secondary | ICD-10-CM | POA: Diagnosis not present

## 2020-12-03 DIAGNOSIS — J208 Acute bronchitis due to other specified organisms: Secondary | ICD-10-CM

## 2020-12-03 MED ORDER — BENZONATATE 100 MG PO CAPS: 100.0000 mg | ORAL_CAPSULE | Freq: Three times a day (TID) | ORAL | 0 refills | Status: DC | PRN

## 2020-12-03 MED ORDER — AZITHROMYCIN 250 MG PO TABS: ORAL_TABLET | ORAL | 0 refills | Status: DC

## 2020-12-03 MED ORDER — IPRATROPIUM-ALBUTEROL 0.5-2.5 (3) MG/3ML IN SOLN: 3.0000 mL | RESPIRATORY_TRACT | 0 refills | Status: DC | PRN

## 2020-12-03 NOTE — Progress Notes (Signed)
Virtual Visit Consent   Jenna Campbell, you are scheduled for a virtual visit with a [place] provider today.     Just as with appointments in the office, your consent must be obtained to participate.  Your consent will be active for this visit and any virtual visit you may have with one of our providers in the next 365 days.     If you have a MyChart account, a copy of this consent can be sent to you electronically.  All virtual visits are billed to your insurance company just like a traditional visit in the office.    As this is a virtual visit, video technology does not allow for your provider to perform a traditional examination.  This may limit your provider's ability to fully assess your condition.  If your provider identifies any concerns that need to be evaluated in person or the need to arrange testing (such as labs, EKG, etc.), we will make arrangements to do so.     Although advances in technology are sophisticated, we cannot ensure that it will always work on either your end or our end.  If the connection with a video visit is poor, the visit may have to be switched to a telephone visit.  With either a video or telephone visit, we are not always able to ensure that we have a secure connection.     I need to obtain your verbal consent now.   Are you willing to proceed with your visit today?    Jenna Campbell has provided verbal consent on 12/03/2020 for a virtual visit (video or telephone).   Mar Daring, PA-C   Date: 12/03/2020 9:11 AM   Virtual Visit via Video Note   I, Mar Daring, connected with  Jenna Campbell  ([id number], 20-Nov-1950) on 12/03/20 at  8:30 AM EDT by a video-enabled telemedicine application and verified that I am speaking with the correct person using two identifiers.  Location: Patient: Virtual Visit Location Patient: Home Provider: Virtual Visit Location Provider: Home Office   I discussed the limitations of evaluation and management by  telemedicine and the availability of in person appointments. The patient expressed understanding and agreed to proceed.    History of Present Illness: Jenna Campbell is a 70 y.o. who identifies as a female who was assigned female at birth, and is being seen today for returning of symptoms following covid 19. She was diagnosed with Covid 19 on 11/19/20 and was hospitalized.Retested last night and was negative. She has had continued cough. Diarrhea started this morning, mild abdominal cramping. Sinus congestion, ear pain and pressure, post nasal drainage, sore throat. Cough has been persistent since infection, but cough worsened last night. Congestion had improved some as well, but continues to build. Denies fever, chills, body aches.  Problems:  Patient Active Problem List   Diagnosis Date Noted   Nausea vomiting and diarrhea 11/19/2020   COVID-19 virus infection 11/19/2020   SOB (shortness of breath) 11/19/2020   Acute hyponatremia 11/19/2020   Senile purpura ([place]) 06/15/2020   GERD (gastroesophageal reflux disease)    Mixed dyslipidemia 11/12/2019   CAD (coronary artery disease) 10/28/2019   Stable angina ([place]) 10/28/2019   Seasonal allergies 10/15/2019   Dupuytren's contracture of both hands 09/17/2019   Chest tightness 07/29/2019   Unstable angina ([place]) 07/29/2019   Pedal edema 07/29/2019   Angina pectoris ([place]) 07/29/2019   Foot pain, bilateral 01/05/2018   Tinnitus of both ears 10/11/2017   Primary insomnia 10/11/2017  Insomnia 10/11/2017   Palpitation 09/03/2017   Chest pain in adult 05/28/2017   Major depressive disorder, recurrent, mild ([place]) 03/14/2017   Allergy    Asthma    Colon polyps    Essential hypertension    Gastroesophageal reflux disease    H/O: hysterectomy    Thyroid disease    Urinary incontinence    Nonintractable headache 11/02/2016   Herpes simplex 08/17/2016   Shortness of breath 07/17/2016   Urinary incontinence without sensory awareness 07/17/2016    Change in bowel function 06/14/2016   Esophageal dysphagia 06/14/2016   History of adenomatous polyp of colon 06/14/2016   Venous insufficiency (chronic) (peripheral) 06/06/2016   Swelling of lower limb 05/17/2016   Varicose veins of both lower extremities 04/24/2016   Cough 02/14/2016   Family history of pheochromocytoma 02/14/2016   Plantar fasciitis 01/20/2016   Acute tension-type headache 12/16/2015   Chronic diarrhea 12/16/2015   Dysuria 12/16/2015   Generalized abdominal pain 12/16/2015   Localized edema 12/16/2015   Mild intermittent extrinsic asthma 12/16/2015   Cervical radiculopathy 09/17/2015   Neck pain 09/17/2015   Low back pain 09/17/2015   Memory difficulty 09/17/2015   Tension-type headache, not intractable 09/17/2015   MCI (mild cognitive impairment) 08/18/2015   Precordial chest pain 08/18/2015   Mild persistent asthma 05/13/2015   Other allergic rhinitis 05/13/2015   Gastroesophageal reflux disease without esophagitis 05/13/2015   Allergic urticaria 05/13/2015   Acute bronchitis due to other specified organisms 04/21/2015   Adjustment disorder with anxious mood 04/21/2015   Basal cell carcinoma 04/21/2015   Bilateral impacted cerumen 04/21/2015   Lumbar paraspinal muscle spasm 04/21/2015   Malaise and fatigue 04/21/2015   Pharyngoesophageal dysphagia 04/21/2015   Shin splint 04/21/2015   Pelvic pain in female 02/17/2015   Allergic asthma with acute exacerbation 09/22/2014   Dermatitis 09/22/2014   Neuropathy 09/22/2014   Allergic rhinitis due to pollen 07/23/2014   Dyspareunia 07/23/2014   Slow transit constipation 07/23/2014   Menopause 03/31/2014   Midline low back pain without sciatica 03/31/2014   Alopecia 10/30/2013   Cystocele, midline 10/30/2013   Diverticulosis of large intestine 10/30/2013   Hypothyroidism 10/30/2013   Hypotonic bladder 10/30/2013   Atrophic vaginitis 10/30/2013   Prolapse of vaginal vault after hysterectomy 10/30/2013    Rectocele 10/30/2013   Sinusitis, chronic 10/30/2013   Urethral stricture 10/30/2013   Major depression, recurrent ([place]) 07/14/2013   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   Encounter for long-term (current) use of other medications 10/19/2012   Migraine 10/19/2012   Personality disorder ([place]) 10/19/2012   Hx of migraines 05/15/2009   History of blood transfusion 05/15/1968    Allergies:  Allergies  Allergen Reactions   Iodinated Diagnostic Agents Itching, Other (See Comments), Rash and Shortness Of Breath    Throat Swelling, Erythema   Kenalog [Triamcinolone Acetonide] Swelling   Metrizamide Itching, Other (See Comments), Rash and Shortness Of Breath    Throat Swelling, Erythema   Other Itching and Swelling    DUST, spicy food, red sause   Penicillins Anaphylaxis and Itching   Crestor [Rosuvastatin] Other (See Comments)    myalgia   Lipitor [Atorvastatin] Other (See Comments)    myalgia   Montelukast Sodium Other (See Comments)    Chest pain, dizziness and lightheadedness   Xyzal [Levocetirizine]     Chest pressure   Ciprofloxacin Itching   Diazepam Rash   Latex Itching, Rash and Swelling   Sulfa Antibiotics Rash   Medications:  Current Outpatient Medications:  acetaminophen (TYLENOL) 500 MG tablet, Take 500 mg by mouth every 6 (six) hours as needed for moderate pain., Disp: , Rfl:    albuterol (PROAIR HFA) 108 (90 Base) MCG/ACT inhaler, Inhale 2 puffs into the lungs every 4 (four) hours as needed for wheezing or shortness of breath., Disp: 18 g, Rfl: 1   albuterol (PROVENTIL) (2.5 MG/3ML) 0.083% nebulizer solution, INHALE 3 ML BY NEBULIZATION EVERY 6 HOURS AS NEEDED FOR WHEEZING OR SHORTNESS OF BREATH, Disp: 75 mL, Rfl: 0   ASPIRIN 81 PO, Take 81 mg by mouth daily. , Disp: , Rfl:    azelastine (ASTELIN) 0.1 % nasal spray, Place 2 sprays into both nostrils 2 (two) times daily. Use in each nostril as directed, Disp: 30 mL, Rfl: 12   azithromycin (ZITHROMAX) 250 MG tablet, Take  250 mg by mouth daily., Disp: , Rfl:    benzonatate (TESSALON) 100 MG capsule, Take 100 mg by mouth 3 (three) times daily as needed for cough., Disp: , Rfl:    betamethasone valerate (VALISONE) 0.1 % cream, Apply 1 application topically daily., Disp: , Rfl:    cholecalciferol (VITAMIN D3) 25 MCG (1000 UNIT) tablet, Take 1,000 Units by mouth daily., Disp: , Rfl:    Coenzyme Q10 (CO Q-10) 200 MG CAPS, Take 200 mg by mouth daily., Disp: 90 capsule, Rfl: 3   fluticasone (FLONASE) 50 MCG/ACT nasal spray, Place 2 sprays into both nostrils daily., Disp: 16 g, Rfl: 2   fluticasone-salmeterol (ADVAIR DISKUS) 250-50 MCG/ACT AEPB, TAKE 1 PUFF BY MOUTH TWICE A DAY, Disp: 60 each, Rfl: 2   gabapentin (NEURONTIN) 300 MG capsule, Take 300 mg by mouth in the morning., Disp: , Rfl:    ibuprofen (ADVIL) 200 MG tablet, Take 400 mg by mouth every 6 (six) hours as needed for mild pain., Disp: , Rfl:    ipratropium-albuterol (DUONEB) 0.5-2.5 (3) MG/3ML SOLN, Take 3 mLs by nebulization every 4 (four) hours as needed for wheezing or shortness of breath., Disp: , Rfl:    isosorbide mononitrate (IMDUR) 30 MG 24 hr tablet, Take 0.5 tablets (15 mg total) by mouth daily. (Patient not taking: Reported on 11/19/2020), Disp: 30 tablet, Rfl: 5   levocetirizine (XYZAL) 5 MG tablet, Take 5 mg by mouth daily., Disp: , Rfl:    levothyroxine (SYNTHROID) 150 MCG tablet, Take 1.5 tablets (225 mcg total) by mouth daily before breakfast., Disp: 135 tablet, Rfl: 2   LORazepam (ATIVAN) 0.5 MG tablet, Take 0.5 mg by mouth every 8 (eight) hours as needed for anxiety. , Disp: , Rfl:    nitroGLYCERIN (NITROSTAT) 0.4 MG SL tablet, Place 0.4 mg under the tongue every 5 (five) minutes as needed for chest pain., Disp: , Rfl:    olopatadine (PATANOL) 0.1 % ophthalmic solution, Place 1 drop into both eyes 2 (two) times daily., Disp: 5 mL, Rfl: 1   ondansetron (ZOFRAN-ODT) 4 MG disintegrating tablet, Take 1 tablet (4 mg total) by mouth every 8 (eight)  hours as needed for nausea or vomiting., Disp: 20 tablet, Rfl: 0   pantoprazole (PROTONIX) 40 MG tablet, Take 1 tablet (40 mg total) by mouth 2 (two) times daily., Disp: 60 tablet, Rfl: 3   Pitavastatin Calcium (LIVALO) 2 MG TABS, Take 0.5 tablets (1 mg total) by mouth daily., Disp: 45 tablet, Rfl: 3   promethazine-phenylephrine (PROMETHAZINE VC) 6.25-5 MG/5ML SYRP, Take 5 mLs by mouth every 6 (six) hours as needed for congestion., Disp: 118 mL, Rfl: 0   temazepam (RESTORIL) 15 MG capsule,  Take 15 mg by mouth at bedtime. , Disp: , Rfl:    topiramate (TOPAMAX) 100 MG tablet, Take 100 mg by mouth at bedtime. , Disp: , Rfl:    valACYclovir (VALTREX) 1000 MG tablet, Take 1 tab daily for 5 days during outbreaks., Disp: 30 tablet, Rfl: 2  Observations/Objective: Patient is well-developed, well-nourished in no acute distress.  Resting comfortably at home.  Head is normocephalic, atraumatic.  No labored breathing. Dry, barking, frequent cough heard, but without effecting speech Speech is clear and coherent with logical content.  Patient is alert and oriented at baseline.   Assessment and Plan: 1. History of COVID-19  2. Acute bacterial bronchitis - Suspect secondary bacterial infection bronchitis vs pneumonia - Will cover with Zpak - Tessalon perles refilled, OK to take with promethazine-VC (prescribed by PCP) - Duoneb prescribed for her nebulizer - Seek in person evaluation if shortness of breath or difficulty breathing develop  Follow Up Instructions: I discussed the assessment and treatment plan with the patient. The patient was provided an opportunity to ask questions and all were answered. The patient agreed with the plan and demonstrated an understanding of the instructions.  A copy of instructions were sent to the patient via MyChart.  The patient was advised to call back or seek an in-person evaluation if the symptoms worsen or if the condition fails to improve as anticipated.  Time:   I spent 16 minutes with the patient via telehealth technology discussing the above problems/concerns.    Mar Daring, PA-C

## 2020-12-03 NOTE — Patient Instructions (Signed)
Acute Bronchitis, Adult  Acute bronchitis is when air tubes in the lungs (bronchi) suddenly get swollen. The condition can make it hard for you to breathe. In adults, acute bronchitis usually goes away within 2 weeks. A cough caused by bronchitis may last up to 3 weeks. Smoking, allergies, and asthma can make thecondition worse. What are the causes? This condition is caused by: Cold and flu viruses. The most common cause of this condition is the virus that causes the common cold. Bacteria. Substances that irritate the lungs, including: Smoke from cigarettes and other types of tobacco. Dust and pollen. Fumes from chemicals, gases, or burned fuel. Other materials that pollute indoor or outdoor air. Close contact with someone who has acute bronchitis. What increases the risk? The following factors may make you more likely to develop this condition: A weak body's defense system. This is also called the immune system. Any condition that affects your lungs and breathing, such as asthma. What are the signs or symptoms? Symptoms of this condition include: A cough. Coughing up clear, yellow, or green mucus. Wheezing. Having too much mucus in your lungs (chest congestion). Shortness of breath. A fever. Chills. Body aches. A sore throat. How is this treated? Acute bronchitis may go away over time without treatment. Your doctor may recommend: Drinking more fluids. Using a device that gets medicine into your lungs (inhaler). Using a vaporizer or a humidifier. These are machines that add water or moisture to the air. This helps with coughing and poor breathing. Taking a medicine for fever. Taking a medicine that thins mucus and clears congestion. Taking a medicine that prevents or stops coughing. Follow these instructions at home: Activity Get a lot of rest. Return to your normal activities as told by your doctor. Ask your doctor what activities are safe for you. Lifestyle  Drink enough  fluid to keep your pee (urine) pale yellow. Do not drink alcohol. Do not use any products that contain nicotine or tobacco, such as cigarettes, e-cigarettes, and chewing tobacco. If you need help quitting, ask your doctor. Be aware that: Your bronchitis will get worse if you smoke or breathe in other people's smoke (secondhand smoke). Your lungs will heal faster if you quit smoking.  General instructions Take over-the-counter and prescription medicines only as told by your doctor. Use an inhaler, cool mist vaporizer, or humidifier as told by your doctor. Rinse your mouth often with salt water. To make salt water, dissolve -1 tsp (3-6 g) of salt in 1 cup (237 mL) of warm water. Take two teaspoons of honey at bedtime. This helps lessen your coughing at night. Keep all follow-up visits as told by your doctor. This is important. How is this prevented? To lower your risk of getting this condition again: Wash your hands often with soap and water. If you cannot use soap and water, use hand sanitizer. Avoid contact with people who have cold symptoms. Try not to touch your mouth, nose, or eyes with your hands. Make sure to get the flu shot every year. Contact a doctor if: Your symptoms do not get better in 2 weeks. You vomit more than once or twice. You have symptoms of loss of fluid from your body (dehydration). These include: Dark pee. Dry skin or eyes. Increased thirst. Headaches. Confusion. Muscle cramps. Get help right away if: You cough up blood. You have chest pain. You have very bad shortness of breath. You become dehydrated. You faint or keep feeling like you are going to faint. You   have a very bad headache. Your fever or chills get worse. These symptoms may be an emergency. Get help right away. Call your local emergency services (911 in the [place] Do not wait to see if the symptoms will go away. Do not drive yourself to the hospital. Summary Acute bronchitis is when air  tubes in the lungs (bronchi) suddenly get swollen. In adults, acute bronchitis usually goes away within 2 weeks. Take over-the-counter and prescription medicines only as told by your doctor. Drink enough fluid to keep your pee (urine) pale yellow. Contact a doctor if your symptoms do not improve after 2 weeks of treatment. Get help right away if you cough up blood, faint, or have chest pain or shortness of breath. This information is not intended to replace advice given to you by your health care provider. Make sure you discuss any questions you have with your healthcare provider. Document Revised: 03/31/2020 Document Reviewed: 11/22/2018 Elsevier Patient Education  2022 [employer]  

## 2020-12-03 NOTE — Telephone Encounter (Signed)
Patient states that cough has gotten worse and that she is having a lot of chest congestion. Patient also had diarrhea this morning. Patient has been doing cough syrup and tessalon pearls. Patient states that her oxygen level has been 96-98%. Patient will have a virtual appointment this morning. Patient advised per protocol for worsening symptoms.

## 2020-12-06 ENCOUNTER — Ambulatory Visit: Payer: PPO | Admitting: Cardiology

## 2020-12-10 ENCOUNTER — Ambulatory Visit: Payer: PPO | Admitting: Cardiology

## 2020-12-10 ENCOUNTER — Other Ambulatory Visit: Payer: Self-pay

## 2020-12-10 ENCOUNTER — Encounter: Payer: Self-pay | Admitting: Cardiology

## 2020-12-10 VITALS — BP 124/76 | HR 64 | Ht 66.6 in | Wt 140.1 lb

## 2020-12-10 DIAGNOSIS — I2510 Atherosclerotic heart disease of native coronary artery without angina pectoris: Secondary | ICD-10-CM | POA: Diagnosis not present

## 2020-12-10 DIAGNOSIS — E785 Hyperlipidemia, unspecified: Secondary | ICD-10-CM | POA: Insufficient documentation

## 2020-12-10 DIAGNOSIS — I10 Essential (primary) hypertension: Secondary | ICD-10-CM

## 2020-12-10 DIAGNOSIS — E782 Mixed hyperlipidemia: Secondary | ICD-10-CM

## 2020-12-10 HISTORY — DX: Hyperlipidemia, unspecified: E78.5

## 2020-12-10 NOTE — Progress Notes (Signed)
Cardiology Office Note:    Date:  12/10/2020   ID:  Jenna Campbell, DOB 07-24-1950, MRN [id number]  PCP:  Jenna Pal, DO  Cardiologist:  Jenna Lindau, MD   Referring MD: Jenna Campbell*    ASSESSMENT:    1. Essential hypertension   2. Coronary artery disease involving native coronary artery of native heart without angina pectoris   3. Mixed hyperlipidemia    PLAN:    In order of problems listed above:  Coronary artery disease: Secondary prevention stressed with the patient.  Importance of compliance with diet medication stressed and she vocalized understanding.  She was advised to walk at least half an hour a day 5 days a week and she promises to do so. Essential hypertension: Blood pressure stable and diet was emphasized.  Lifestyle modification urged. Mixed dyslipidemia: Lipids reviewed.  She will need blood work and will come back in the next few days for follow-up blood work. Patient will be seen in follow-up appointment in 6 months or earlier if the patient has any concerns    Medication Adjustments/Labs and Tests Ordered: Current medicines are reviewed at length with the patient today.  Concerns regarding medicines are outlined above.  Orders Placed This Encounter  Procedures   Basic metabolic panel   Hepatic function panel   Lipid panel    No orders of the defined types were placed in this encounter.    No chief complaint on file.    History of Present Illness:    Jenna Campbell is a 70 y.o. female.  Patient has past medical history of coronary artery disease, essential hypertension and dyslipidemia.  She denies any problems at this time and takes care of activities of daily living.  No chest pain orthopnea or PND.  At the time of my evaluation, the patient is alert awake oriented and in no distress.  Past Medical History:  Diagnosis Date   Acute bronchitis due to other specified organisms 04/21/2015   Acute hyponatremia 11/19/2020    Acute tension-type headache 12/16/2015   Adjustment disorder with anxious mood 04/21/2015   Allergic asthma with acute exacerbation 09/22/2014   Allergic rhinitis due to pollen 07/23/2014   Allergic urticaria 05/13/2015   Allergy    Alopecia 10/30/2013   Angina pectoris ([place]) 07/29/2019   Asthma    Atrophic vaginitis 10/30/2013   Basal cell carcinoma 04/21/2015   Bilateral impacted cerumen 04/21/2015   CAD (coronary artery disease) 10/28/2019   Cervical radiculopathy 09/17/2015   Change in bowel function 06/14/2016   Chest pain in adult 05/28/2017   Chest tightness 07/29/2019   Chronic diarrhea 12/16/2015   Colon polyps    Cough 02/14/2016   COVID-19 virus infection 11/19/2020   Cystocele, midline 10/30/2013   Dermatitis 09/22/2014   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   Diverticulosis of large intestine 10/30/2013   Dupuytren's contracture of both hands 09/17/2019   Dyspareunia 07/23/2014   Dysuria 12/16/2015   Encounter for long-term (current) use of other medications 10/19/2012   Esophageal dysphagia 06/14/2016   Essential hypertension    Family history of pheochromocytoma 02/14/2016   Foot pain, bilateral 01/05/2018   Gastroesophageal reflux disease    Gastroesophageal reflux disease without esophagitis 05/13/2015   Generalized abdominal pain 12/16/2015   GERD (gastroesophageal reflux disease)    H/O: hysterectomy    Herpes simplex 08/17/2016   History of adenomatous polyp of colon 06/14/2016   History of blood transfusion 1970   Hx of migraines 05/15/2009   Hypothyroidism 10/30/2013  Hypotonic bladder 10/30/2013   Insomnia 10/11/2017   Localized edema 12/16/2015   Low back pain 09/17/2015   Lumbar paraspinal muscle spasm 04/21/2015   Major depression, recurrent ([place]) 07/14/2013   Major depressive disorder, recurrent, mild ([place]) 03/14/2017   Malaise and fatigue 04/21/2015   MCI (mild cognitive impairment) 08/18/2015   Memory difficulty 09/17/2015   Menopause  03/31/2014   Midline low back pain without sciatica 03/31/2014   Migraine    Mild intermittent extrinsic asthma 12/16/2015   Mild persistent asthma 05/13/2015   Mixed dyslipidemia 11/12/2019   Nausea vomiting and diarrhea 11/19/2020   Neck pain 09/17/2015   Neuropathy 09/22/2014   Nonintractable headache 11/02/2016   Other allergic rhinitis 05/13/2015   Palpitation 09/03/2017   Pedal edema 07/29/2019   Pelvic pain in female 02/17/2015   Personality disorder ([place]) 10/19/2012   Pharyngoesophageal dysphagia 04/21/2015   Plantar fasciitis 01/20/2016   Precordial chest pain 08/18/2015   Primary insomnia 10/11/2017   Prolapse of vaginal vault after hysterectomy 10/30/2013   Rectocele 10/30/2013   Seasonal allergies 10/15/2019   Senile purpura ([place]) 06/15/2020   Shin splint 04/21/2015   Shortness of breath 07/17/2016   Sinusitis, chronic 10/30/2013   Slow transit constipation 07/23/2014   SOB (shortness of breath) 11/19/2020   Stable angina (HCC) 10/28/2019   Swelling of lower limb 05/17/2016   Tension-type headache, not intractable 09/17/2015   Thyroid disease    Tinnitus of both ears 10/11/2017   Unstable angina ([place]) 07/29/2019   Urethral stricture 10/30/2013   Urinary incontinence    Urinary incontinence without sensory awareness 07/17/2016   Varicose veins of both lower extremities 04/24/2016   Venous insufficiency (chronic) (peripheral) 06/06/2016    Past Surgical History:  Procedure Laterality Date   BREAST CYST ASPIRATION Left    25 years ago    COLONOSCOPY  04/2016   [facility] Diverticulitis and Multiple colon polyps   ESOPHAGOGASTRODUODENOSCOPY  04/2016   [facility]   INTRAVASCULAR PRESSURE WIRE/FFR STUDY N/A 10/17/2019   Procedure: INTRAVASCULAR PRESSURE WIRE/FFR STUDY;  Surgeon: Nelva Bush, MD;  Location: [facility];  Service: Cardiovascular;  Laterality: N/A;   LEFT HEART CATH AND CORONARY ANGIOGRAPHY N/A 10/17/2019   Procedure: LEFT HEART  CATH AND CORONARY ANGIOGRAPHY;  Surgeon: Nelva Bush, MD;  Location: [facility];  Service: Cardiovascular;  Laterality: N/A;   MOHS SURGERY  10/05/2015   RECTOCELE REPAIR  2011   SKIN CANCER EXCISION  02/2015   Squamous cell removed from back   TONSILLECTOMY     VAGINAL HYSTERECTOMY     VAGINAL PROLAPSE REPAIR      Current Medications: Current Meds  Medication Sig   acetaminophen (TYLENOL) 500 MG tablet Take 500 mg by mouth every 6 (six) hours as needed for moderate pain.   albuterol (PROAIR HFA) 108 (90 Base) MCG/ACT inhaler Inhale 2 puffs into the lungs every 4 (four) hours as needed for wheezing or shortness of breath.   albuterol (PROVENTIL) (2.5 MG/3ML) 0.083% nebulizer solution INHALE 3 ML BY NEBULIZATION EVERY 6 HOURS AS NEEDED FOR WHEEZING OR SHORTNESS OF BREATH   ASPIRIN 81 PO Take 81 mg by mouth daily.    azelastine (ASTELIN) 0.1 % nasal spray Place 2 sprays into both nostrils 2 (two) times daily. Use in each nostril as directed   benzonatate (TESSALON) 100 MG capsule Take 100 mg by mouth 3 (three) times daily as needed for cough.   betamethasone valerate (VALISONE) 0.1 % cream Apply  1 application topically daily.   cholecalciferol (VITAMIN D3) 25 MCG (1000 UNIT) tablet Take 1,000 Units by mouth daily.   Coenzyme Q10 (CO Q-10) 200 MG CAPS Take 200 mg by mouth daily.   fluticasone (FLONASE) 50 MCG/ACT nasal spray Place 2 sprays into both nostrils as needed for rhinitis or allergies.   fluticasone-salmeterol (ADVAIR DISKUS) 250-50 MCG/ACT AEPB TAKE 1 PUFF BY MOUTH TWICE A DAY   gabapentin (NEURONTIN) 300 MG capsule Take 300 mg by mouth in the morning.   ibuprofen (ADVIL) 200 MG tablet Take 400 mg by mouth every 6 (six) hours as needed for mild pain.   ipratropium-albuterol (DUONEB) 0.5-2.5 (3) MG/3ML SOLN Take 3 mLs by nebulization every 4 (four) hours as needed for wheezing or shortness of breath.   levocetirizine (XYZAL) 5 MG tablet Take 5 mg by mouth daily.    levothyroxine (SYNTHROID) 150 MCG tablet Take 1.5 tablets (225 mcg total) by mouth daily before breakfast.   LORazepam (ATIVAN) 0.5 MG tablet Take 0.5 mg by mouth every 8 (eight) hours as needed for anxiety.    nitroGLYCERIN (NITROSTAT) 0.4 MG SL tablet Place 0.4 mg under the tongue every 5 (five) minutes as needed for chest pain.   olopatadine (PATANOL) 0.1 % ophthalmic solution Place 1 drop into both eyes 2 (two) times daily.   ondansetron (ZOFRAN-ODT) 4 MG disintegrating tablet Take 1 tablet (4 mg total) by mouth every 8 (eight) hours as needed for nausea or vomiting.   pantoprazole (PROTONIX) 40 MG tablet Take 1 tablet (40 mg total) by mouth 2 (two) times daily.   Pitavastatin Calcium (LIVALO) 2 MG TABS Take 0.5 tablets (1 mg total) by mouth daily.   promethazine-phenylephrine (PROMETHAZINE VC) 6.25-5 MG/5ML SYRP Take 5 mLs by mouth every 6 (six) hours as needed for congestion.   temazepam (RESTORIL) 15 MG capsule Take 15 mg by mouth at bedtime.    topiramate (TOPAMAX) 100 MG tablet Take 100 mg by mouth at bedtime.    valACYclovir (VALTREX) 1000 MG tablet Take 1 tab daily for 5 days during outbreaks.     Allergies:   Iodinated diagnostic agents, Kenalog [triamcinolone acetonide], Metrizamide, Other, Penicillins, Crestor [rosuvastatin], Lipitor [atorvastatin], Montelukast sodium, Xyzal [levocetirizine], Ciprofloxacin, Diazepam, Latex, and Sulfa antibiotics   Social History   Socioeconomic History   Marital status: Divorced    Spouse name: Not on file   Number of children: Not on file   Years of education: Not on file   Highest education level: Not on file  Occupational History   Not on file  Tobacco Use   Smoking status: Never   Smokeless tobacco: Never  Vaping Use   Vaping Use: Never used  Substance and Sexual Activity   Alcohol use: Not Currently   Drug use: No   Sexual activity: Never    Partners: Male  Other Topics Concern   Not on file  Social History Narrative   Not on  file   Social Determinants of Health   Financial Resource Strain: Not on file  Food Insecurity: Not on file  Transportation Needs: Not on file  Physical Activity: Not on file  Stress: Not on file  Social Connections: Not on file     Family History: The patient's family history includes Alzheimer's disease in her mother; Angina in her father; Asthma in her father; Cancer in her sister; Emphysema in her father. There is no history of Colon cancer or Esophageal cancer.  ROS:   Please see the history of present  illness.    All other systems reviewed and are negative.  EKGs/Labs/Other Studies Reviewed:    The following studies were reviewed today: I discussed my findings with the patient at length.   Recent Labs: 11/19/2020: TSH 1.448 11/22/2020: B Natriuretic Peptide 27.2; Magnesium 2.0 11/26/2020: ALT 35; BUN 13; Creatinine, Ser 0.70; Hemoglobin 12.7; Platelets 214.0; Potassium 4.1; Sodium 135  Recent Lipid Panel    Component Value Date/Time   CHOL 178 05/20/2020 0945   TRIG 134 05/20/2020 0945   HDL 43 05/20/2020 0945   CHOLHDL 4.1 05/20/2020 0945   LDLCALC 111 (H) 05/20/2020 0945    Physical Exam:    VS:  BP 124/76   Pulse 64   Ht 5' 6.6" (1.692 m)   Wt 140 lb 1.9 oz (63.6 kg)   SpO2 97%   BMI 22.21 kg/m     Wt Readings from Last 3 Encounters:  12/10/20 140 lb 1.9 oz (63.6 kg)  11/26/20 137 lb 6 oz (62.3 kg)  11/22/20 141 lb 1.5 oz (64 kg)     GEN: Patient is in no acute distress HEENT: Normal NECK: No JVD; No carotid bruits LYMPHATICS: No lymphadenopathy CARDIAC: Hear sounds regular, 2/6 systolic murmur at the apex. RESPIRATORY:  Clear to auscultation without rales, wheezing or rhonchi  ABDOMEN: Soft, non-tender, non-distended MUSCULOSKELETAL:  No edema; No deformity  SKIN: Warm and dry NEUROLOGIC:  Alert and oriented x 3 PSYCHIATRIC:  Normal affect   Signed, Jenna Lindau, MD  12/10/2020 4:13 PM    [facility]

## 2020-12-10 NOTE — Patient Instructions (Signed)

## 2020-12-13 ENCOUNTER — Ambulatory Visit (INDEPENDENT_AMBULATORY_CARE_PROVIDER_SITE_OTHER): Payer: PPO | Admitting: Family Medicine

## 2020-12-13 ENCOUNTER — Encounter: Payer: Self-pay | Admitting: Family Medicine

## 2020-12-13 ENCOUNTER — Other Ambulatory Visit (HOSPITAL_COMMUNITY)
Admission: RE | Admit: 2020-12-13 | Discharge: 2020-12-13 | Disposition: A | Discharge status: Home / Self Care | Payer: PPO | Source: Ambulatory Visit | Attending: Family Medicine | Admitting: Family Medicine

## 2020-12-13 ENCOUNTER — Other Ambulatory Visit: Payer: Self-pay

## 2020-12-13 VITALS — BP 104/68 | HR 55 | Temp 97.7°F | Ht 66.5 in | Wt 143.0 lb

## 2020-12-13 DIAGNOSIS — R109 Unspecified abdominal pain: Secondary | ICD-10-CM

## 2020-12-13 DIAGNOSIS — R300 Dysuria: Secondary | ICD-10-CM | POA: Insufficient documentation

## 2020-12-13 DIAGNOSIS — E782 Mixed hyperlipidemia: Secondary | ICD-10-CM | POA: Diagnosis not present

## 2020-12-13 LAB — POC URINALSYSI DIPSTICK (AUTOMATED)
Bilirubin, UA: NEGATIVE
Blood, UA: NEGATIVE
Glucose, UA: NEGATIVE
Ketones, UA: NEGATIVE
Leukocytes, UA: NEGATIVE
Nitrite, UA: NEGATIVE
Protein, UA: NEGATIVE
Spec Grav, UA: <=1.005 — AB (ref 1.010–1.025)
Urobilinogen, UA: 0.2 E.U./dL
pH, UA: 7 (ref 5.0–8.0)

## 2020-12-13 MED ORDER — FLUCONAZOLE 150 MG PO TABS: ORAL_TABLET | ORAL | 0 refills | Status: DC

## 2020-12-13 NOTE — Progress Notes (Addendum)
Chief Complaint  Patient presents with   Abdominal Pain   Flank Pain   Bloated    Jenna Campbell is a 70 y.o. female here for possible UTI.  Duration: 1 week. Recently hospitalized Symptoms: Dysuria, flank pain on right, abd pressure, and urinary incontinence Denies: urinary frequency, hematuria, urinary hesitancy, urinary retention, fever, nausea, and vomiting, vaginal discharge Hx of recurrent UTI? No Tried drinking cranberry juice, Azo, hydration,  Denies new sexual partners.  Past Medical History:  Diagnosis Date   Acute bronchitis due to other specified organisms 04/21/2015   Acute hyponatremia 11/19/2020   Acute tension-type headache 12/16/2015   Adjustment disorder with anxious mood 04/21/2015   Allergic asthma with acute exacerbation 09/22/2014   Allergic rhinitis due to pollen 07/23/2014   Allergic urticaria 05/13/2015   Allergy    Alopecia 10/30/2013   Angina pectoris ([place]) 07/29/2019   Asthma    Atrophic vaginitis 10/30/2013   Basal cell carcinoma 04/21/2015   Bilateral impacted cerumen 04/21/2015   CAD (coronary artery disease) 10/28/2019   Cervical radiculopathy 09/17/2015   Change in bowel function 06/14/2016   Chest pain in adult 05/28/2017   Chest tightness 07/29/2019   Chronic diarrhea 12/16/2015   Colon polyps    Cough 02/14/2016   COVID-19 virus infection 11/19/2020   Cystocele, midline 10/30/2013   Dermatitis 09/22/2014   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   Diverticulosis of large intestine 10/30/2013   Dupuytren's contracture of both hands 09/17/2019   Dyspareunia 07/23/2014   Dysuria 12/16/2015   Encounter for long-term (current) use of other medications 10/19/2012   Esophageal dysphagia 06/14/2016   Essential hypertension    Family history of pheochromocytoma 02/14/2016   Foot pain, bilateral 01/05/2018   Gastroesophageal reflux disease    Gastroesophageal reflux disease without esophagitis 05/13/2015   Generalized abdominal pain 12/16/2015    GERD (gastroesophageal reflux disease)    H/O: hysterectomy    Herpes simplex 08/17/2016   History of adenomatous polyp of colon 06/14/2016   History of blood transfusion 1970   Hx of migraines 05/15/2009   Hypothyroidism 10/30/2013   Hypotonic bladder 10/30/2013   Insomnia 10/11/2017   Localized edema 12/16/2015   Low back pain 09/17/2015   Lumbar paraspinal muscle spasm 04/21/2015   Major depression, recurrent ([place]) 07/14/2013   Major depressive disorder, recurrent, mild ([place]) 03/14/2017   Malaise and fatigue 04/21/2015   MCI (mild cognitive impairment) 08/18/2015   Memory difficulty 09/17/2015   Menopause 03/31/2014   Midline low back pain without sciatica 03/31/2014   Migraine    Mild intermittent extrinsic asthma 12/16/2015   Mild persistent asthma 05/13/2015   Mixed dyslipidemia 11/12/2019   Nausea vomiting and diarrhea 11/19/2020   Neck pain 09/17/2015   Neuropathy 09/22/2014   Nonintractable headache 11/02/2016   Other allergic rhinitis 05/13/2015   Palpitation 09/03/2017   Pedal edema 07/29/2019   Pelvic pain in female 02/17/2015   Personality disorder ([place]) 10/19/2012   Pharyngoesophageal dysphagia 04/21/2015   Plantar fasciitis 01/20/2016   Precordial chest pain 08/18/2015   Primary insomnia 10/11/2017   Prolapse of vaginal vault after hysterectomy 10/30/2013   Rectocele 10/30/2013   Seasonal allergies 10/15/2019   Senile purpura ([place]) 06/15/2020   Shin splint 04/21/2015   Shortness of breath 07/17/2016   Sinusitis, chronic 10/30/2013   Slow transit constipation 07/23/2014   SOB (shortness of breath) 11/19/2020   Stable angina (HCC) 10/28/2019   Swelling of lower limb 05/17/2016   Tension-type headache, not intractable 09/17/2015   Thyroid disease  Tinnitus of both ears 10/11/2017   Unstable angina ([place]) 07/29/2019   Urethral stricture 10/30/2013   Urinary incontinence    Urinary incontinence without sensory awareness 07/17/2016   Varicose veins  of both lower extremities 04/24/2016   Venous insufficiency (chronic) (peripheral) 06/06/2016     BP 104/68   Pulse (!) 55   Temp 97.7 F (36.5 C) (Oral)   Ht 5' 6.5" (1.689 m)   Wt 143 lb (64.9 kg)   SpO2 97%   BMI 22.74 kg/m  General: Awake, alert, appears stated age Heart: Reg rhythm,  Lungs: CTAB, normal respiratory effort, no accessory muscle usage Abd: BS+, soft, +diffuse ttp, ND, no masses or organomegaly MSK: No CVA tenderness, neg Lloyd's sign Psych: Age appropriate judgment and insight  Dysuria - Plan: Urine cytology ancillary only([place]), POCT Urinalysis Dipstick (Automated), Urine Culture, fluconazole (DIFLUCAN) 150 MG tablet  Flank pain  Try Diflucan, ck urine culture. IC? BM's have been normal. Will f/u w GYN for pelvic floor eval.  Stay hydrated. Seek immediate care if pt starts to develop fevers, new/worsening symptoms, uncontrollable N/V. F/u prn. The patient voiced understanding and agreement to the plan.  [place], DO 12/13/20 10:37 AM

## 2020-12-13 NOTE — Addendum Note (Signed)
Addended by: Sharon Seller B on: 12/13/2020 10:48 AM   Modules accepted: Orders

## 2020-12-13 NOTE — Patient Instructions (Addendum)
Stay hydrated.   Warning signs/symptoms: Uncontrollable nausea/vomiting, fevers, worsening symptoms despite treatment, confusion.  Give [place] around 2 business days to get culture back to you.  Call Center [facility] at [facility] at [phone] for an appointment.  They are located at [address], [place], [place], [address] (right across the hall from our office).  Make sure your bowel movements are normal.  Let us know if you need anything.

## 2020-12-14 ENCOUNTER — Telehealth: Payer: Self-pay

## 2020-12-14 ENCOUNTER — Ambulatory Visit (INDEPENDENT_AMBULATORY_CARE_PROVIDER_SITE_OTHER): Payer: PPO

## 2020-12-14 ENCOUNTER — Other Ambulatory Visit: Payer: Self-pay

## 2020-12-14 VITALS — Ht 66.5 in | Wt 143.0 lb

## 2020-12-14 DIAGNOSIS — E782 Mixed hyperlipidemia: Secondary | ICD-10-CM

## 2020-12-14 DIAGNOSIS — Z0000 Encounter for general adult medical examination without abnormal findings: Secondary | ICD-10-CM | POA: Diagnosis not present

## 2020-12-14 LAB — LIPID PANEL
Chol/HDL Ratio: 4.2 ratio (ref 0.0–4.4)
Cholesterol, Total: 164 mg/dL (ref 100–199)
HDL: 39 mg/dL — ABNORMAL LOW (ref >39)
LDL Chol Calc (NIH): 88 mg/dL (ref 0–99)
Triglycerides: 222 mg/dL — ABNORMAL HIGH (ref 0–149)
VLDL Cholesterol Cal: 37 mg/dL (ref 5–40)

## 2020-12-14 LAB — URINE CYTOLOGY ANCILLARY ONLY
Bacterial Vaginitis-Urine: NEGATIVE
Candida Urine: NEGATIVE
Chlamydia: NEGATIVE
Comment: NEGATIVE
Comment: NEGATIVE
Comment: NORMAL
Neisseria Gonorrhea: NEGATIVE
Trichomonas: NEGATIVE

## 2020-12-14 LAB — HEPATIC FUNCTION PANEL
ALT: 10 IU/L (ref 0–32)
AST: 16 IU/L (ref 0–40)
Albumin: 3.6 g/dL — ABNORMAL LOW (ref 3.8–4.8)
Alkaline Phosphatase: 60 IU/L (ref 44–121)
Bilirubin Total: 0.3 mg/dL (ref 0.0–1.2)
Bilirubin, Direct: 0.1 mg/dL (ref 0.00–0.40)
Total Protein: 5.9 g/dL — ABNORMAL LOW (ref 6.0–8.5)

## 2020-12-14 LAB — BASIC METABOLIC PANEL
BUN/Creatinine Ratio: 15 (ref 12–28)
BUN: 11 mg/dL (ref 8–27)
CO2: 19 mmol/L — ABNORMAL LOW (ref 20–29)
Calcium: 8.6 mg/dL — ABNORMAL LOW (ref 8.7–10.3)
Chloride: 103 mmol/L (ref 96–106)
Creatinine, Ser: 0.74 mg/dL (ref 0.57–1.00)
Glucose: 87 mg/dL (ref 65–99)
Potassium: 4 mmol/L (ref 3.5–5.2)
Sodium: 136 mmol/L (ref 134–144)
eGFR: 87 mL/min/{1.73_m2} (ref >59)

## 2020-12-14 LAB — URINE CULTURE
MICRO NUMBER:: 12185538
Result:: NO GROWTH
SPECIMEN QUALITY:: ADEQUATE

## 2020-12-14 MED ORDER — ICOSAPENT ETHYL 1 G PO CAPS: 2.0000 g | ORAL_CAPSULE | Freq: Two times a day (BID) | ORAL | 3 refills | Status: DC

## 2020-12-14 NOTE — Telephone Encounter (Signed)
Spoke with patient regarding results and recommendation.  Patient verbalizes understanding and is agreeable to plan of care. Advised patient to call back with any issues or concerns.  

## 2020-12-14 NOTE — Telephone Encounter (Signed)
-----   Message from Jenean Lindau, MD sent at 12/14/2020  2:42 PM EDT ----- Please check for me if she is taking a statin. Jenean Lindau, MD 12/14/2020 2:41 PM

## 2020-12-14 NOTE — Patient Instructions (Signed)
[name] , Thank you for taking time to complete your Medicare Wellness Visit. I appreciate your ongoing commitment to your health goals. Please review the following plan we discussed and let me know if I can assist you in the future.   Screening recommendations/referrals: Colonoscopy: Please call GI to schedule Mammogram: Completed 09/21/2020-Due 09/21/2021 Bone Density: Completed 09/21/2020-Due 09/22/2022 Recommended yearly ophthalmology/optometry visit for glaucoma screening and checkup Recommended yearly dental visit for hygiene and checkup  Vaccinations: Influenza vaccine: Due 01/2021 Pneumococcal vaccine: Due-May obtain vaccine at our office or your local pharmacy Tdap vaccine: Discuss with pharmacy Shingles vaccine: Discuss with pharmacy   Covid-19:Up to date  Advanced directives: Copy in chart  Conditions/risks identified: See problem list  Next appointment: Follow up in one year for your annual wellness visit 12/19/2021 @ 11:40   Preventive Care 71 Years and Older, Female Preventive care refers to lifestyle choices and visits with your health care provider that can promote health and wellness.  What does preventive care include? A yearly physical exam. This is also called an annual well check. Dental exams once or twice a year. Routine eye exams. Ask your health care provider how often you should have your eyes checked. Personal lifestyle choices, including: Daily care of your teeth and gums. Regular physical activity. Eating a healthy diet. Avoiding tobacco and drug use. Limiting alcohol use. Practicing safe sex. Taking low-dose aspirin every day. Taking vitamin and mineral supplements as recommended by your health care provider. What happens during an annual well check? The services and screenings done by your health care provider during your annual well check will depend on your age, overall health, lifestyle risk factors, and family history of disease. Counseling   Your health care provider may ask you questions about your: Alcohol use. Tobacco use. Drug use. Emotional well-being. Home and relationship well-being. Sexual activity. Eating habits. History of falls. Memory and ability to understand (cognition). Work and work Statistician. Reproductive health. Screening  You may have the following tests or measurements: Height, weight, and BMI. Blood pressure. Lipid and cholesterol levels. These may be checked every 5 years, or more frequently if you are over 59 years old. Skin check. Lung cancer screening. You may have this screening every year starting at age 42 if you have a 30-pack-year history of smoking and currently smoke or have quit within the past 15 years. Fecal occult blood test (FOBT) of the stool. You may have this test every year starting at age 48. Flexible sigmoidoscopy or colonoscopy. You may have a sigmoidoscopy every 5 years or a colonoscopy every 10 years starting at age 32. Hepatitis C blood test. Hepatitis B blood test. Sexually transmitted disease (STD) testing. Diabetes screening. This is done by checking your blood sugar (glucose) after you have not eaten for a while (fasting). You may have this done every 1-3 years. Bone density scan. This is done to screen for osteoporosis. You may have this done starting at age 38. Mammogram. This may be done every 1-2 years. Talk to your health care provider about how often you should have regular mammograms. Talk with your health care provider about your test results, treatment options, and if necessary, the need for more tests. Vaccines  Your health care provider may recommend certain vaccines, such as: Influenza vaccine. This is recommended every year. Tetanus, diphtheria, and acellular pertussis (Tdap, Td) vaccine. You may need a Td booster every 10 years. Zoster vaccine. You may need this after age 71. Pneumococcal 13-valent conjugate (PCV13) vaccine.  One dose is recommended  after age 85. Pneumococcal polysaccharide (PPSV23) vaccine. One dose is recommended after age 85. Talk to your health care provider about which screenings and vaccines you need and how often you need them. This information is not intended to replace advice given to you by your health care provider. Make sure you discuss any questions you have with your health care provider. Document Released: 05/28/2015 Document Revised: 01/19/2016 Document Reviewed: 03/02/2015 Elsevier Interactive Patient Education  2017 [place] Prevention in the Home Falls can cause injuries. They can happen to people of all ages. There are many things you can do to make your home safe and to help prevent falls. What can I do on the outside of my home? Regularly fix the edges of walkways and driveways and fix any cracks. Remove anything that might make you trip as you walk through a door, such as a raised step or threshold. Trim any bushes or trees on the path to your home. Use bright outdoor lighting. Clear any walking paths of anything that might make someone trip, such as rocks or tools. Regularly check to see if handrails are loose or broken. Make sure that both sides of any steps have handrails. Any raised decks and porches should have guardrails on the edges. Have any leaves, snow, or ice cleared regularly. Use sand or salt on walking paths during winter. Clean up any spills in your garage right away. This includes oil or grease spills. What can I do in the bathroom? Use night lights. Install grab bars by the toilet and in the tub and shower. Do not use towel bars as grab bars. Use non-skid mats or decals in the tub or shower. If you need to sit down in the shower, use a plastic, non-slip stool. Keep the floor dry. Clean up any water that spills on the floor as soon as it happens. Remove soap buildup in the tub or shower regularly. Attach bath mats securely with double-sided non-slip rug tape. Do not  have throw rugs and other things on the floor that can make you trip. What can I do in the bedroom? Use night lights. Make sure that you have a light by your bed that is easy to reach. Do not use any sheets or blankets that are too big for your bed. They should not hang down onto the floor. Have a firm chair that has side arms. You can use this for support while you get dressed. Do not have throw rugs and other things on the floor that can make you trip. What can I do in the kitchen? Clean up any spills right away. Avoid walking on wet floors. Keep items that you use a lot in easy-to-reach places. If you need to reach something above you, use a strong step stool that has a grab bar. Keep electrical cords out of the way. Do not use floor polish or wax that makes floors slippery. If you must use wax, use non-skid floor wax. Do not have throw rugs and other things on the floor that can make you trip. What can I do with my stairs? Do not leave any items on the stairs. Make sure that there are handrails on both sides of the stairs and use them. Fix handrails that are broken or loose. Make sure that handrails are as long as the stairways. Check any carpeting to make sure that it is firmly attached to the stairs. Fix any carpet that is loose or  worn. Avoid having throw rugs at the top or bottom of the stairs. If you do have throw rugs, attach them to the floor with carpet tape. Make sure that you have a light switch at the top of the stairs and the bottom of the stairs. If you do not have them, ask someone to add them for you. What else can I do to help prevent falls? Wear shoes that: Do not have high heels. Have rubber bottoms. Are comfortable and fit you well. Are closed at the toe. Do not wear sandals. If you use a stepladder: Make sure that it is fully opened. Do not climb a closed stepladder. Make sure that both sides of the stepladder are locked into place. Ask someone to hold it for  you, if possible. Clearly mark and make sure that you can see: Any grab bars or handrails. First and last steps. Where the edge of each step is. Use tools that help you move around (mobility aids) if they are needed. These include: Canes. Walkers. Scooters. Crutches. Turn on the lights when you go into a dark area. Replace any light bulbs as soon as they burn out. Set up your furniture so you have a clear path. Avoid moving your furniture around. If any of your floors are uneven, fix them. If there are any pets around you, be aware of where they are. Review your medicines with your doctor. Some medicines can make you feel dizzy. This can increase your chance of falling. Ask your doctor what other things that you can do to help prevent falls. This information is not intended to replace advice given to you by your health care provider. Make sure you discuss any questions you have with your health care provider. Document Released: 02/25/2009 Document Revised: 10/07/2015 Document Reviewed: 06/05/2014 Elsevier Interactive Patient Education  2017 [employer].

## 2020-12-14 NOTE — Addendum Note (Signed)
Addended by: Resa Miner I on: 12/14/2020 04:21 PM   Modules accepted: Orders

## 2020-12-14 NOTE — Progress Notes (Signed)
Subjective:   Jenna Campbell is a 70 y.o. female who presents for Medicare Annual (Subsequent) preventive examination.  I connected with Zoà today by telephone and verified that I am speaking with the correct person using two identifiers. Location patient: home Location provider: work Persons participating in the virtual visit: patient, Marine scientist.    I discussed the limitations, risks, security and privacy concerns of performing an evaluation and management service by telephone and the availability of in person appointments. I also discussed with the patient that there may be a patient responsible charge related to this service. The patient expressed understanding and verbally consented to this telephonic visit.    Interactive audio and video telecommunications were attempted between this provider and patient, however failed, due to patient having technical difficulties OR patient did not have access to video capability.  We continued and completed visit with audio only.  Some vital signs may be absent or patient reported.   Time Spent with patient on telephone encounter: 30 minutes   Review of Systems     Cardiac Risk Factors include: advanced age (>43mn, >>34women);hypertension;dyslipidemia     Objective:    Today's Vitals   12/14/20 1141  Weight: 143 lb (64.9 kg)  Height: 5' 6.5" (1.689 m)  PainSc: 6    Body mass index is 22.74 kg/m.  Advanced Directives 12/14/2020 11/19/2020 11/18/2020 07/27/2019 11/28/2017  Does Patient Have a Medical Advance Directive? Yes No No Yes No  Type of [place] - - [place]-  Does patient want to make changes to medical advance directive? - - - No - Patient declined -  Copy of HGreshamin Chart? Yes - validated most recent copy scanned in chart (See row information) - - - -  Would patient like information on creating a medical advance directive? - No - Patient  declined - - No - Patient declined    Current Medications (verified) Outpatient Encounter Medications as of 12/14/2020  Medication Sig   acetaminophen (TYLENOL) 500 MG tablet Take 500 mg by mouth every 6 (six) hours as needed for moderate pain.   albuterol (PROAIR HFA) 108 (90 Base) MCG/ACT inhaler Inhale 2 puffs into the lungs every 4 (four) hours as needed for wheezing or shortness of breath.   albuterol (PROVENTIL) (2.5 MG/3ML) 0.083% nebulizer solution INHALE 3 ML BY NEBULIZATION EVERY 6 HOURS AS NEEDED FOR WHEEZING OR SHORTNESS OF BREATH   ASPIRIN 81 PO Take 81 mg by mouth daily.    azelastine (ASTELIN) 0.1 % nasal spray Place 2 sprays into both nostrils 2 (two) times daily. Use in each nostril as directed   benzonatate (TESSALON) 100 MG capsule Take 100 mg by mouth 3 (three) times daily as needed for cough.   betamethasone valerate (VALISONE) 0.1 % cream Apply 1 application topically daily.   cholecalciferol (VITAMIN D3) 25 MCG (1000 UNIT) tablet Take 1,000 Units by mouth daily.   Coenzyme Q10 (CO Q-10) 200 MG CAPS Take 200 mg by mouth daily.   fluconazole (DIFLUCAN) 150 MG tablet Take 1 tab, repeat in 48 hours if no improvement.   fluticasone (FLONASE) 50 MCG/ACT nasal spray Place 2 sprays into both nostrils as needed for rhinitis or allergies.   fluticasone-salmeterol (ADVAIR DISKUS) 250-50 MCG/ACT AEPB TAKE 1 PUFF BY MOUTH TWICE A DAY   gabapentin (NEURONTIN) 300 MG capsule Take 300 mg by mouth in the morning.   ibuprofen (ADVIL) 200 MG tablet Take 400  mg by mouth every 6 (six) hours as needed for mild pain.   ipratropium-albuterol (DUONEB) 0.5-2.5 (3) MG/3ML SOLN Take 3 mLs by nebulization every 4 (four) hours as needed for wheezing or shortness of breath.   levocetirizine (XYZAL) 5 MG tablet Take 5 mg by mouth daily.   levothyroxine (SYNTHROID) 150 MCG tablet Take 1.5 tablets (225 mcg total) by mouth daily before breakfast.   LORazepam (ATIVAN) 0.5 MG tablet Take 0.5 mg by mouth every  8 (eight) hours as needed for anxiety.    nitroGLYCERIN (NITROSTAT) 0.4 MG SL tablet Place 0.4 mg under the tongue every 5 (five) minutes as needed for chest pain.   olopatadine (PATANOL) 0.1 % ophthalmic solution Place 1 drop into both eyes 2 (two) times daily.   ondansetron (ZOFRAN-ODT) 4 MG disintegrating tablet Take 1 tablet (4 mg total) by mouth every 8 (eight) hours as needed for nausea or vomiting.   pantoprazole (PROTONIX) 40 MG tablet Take 1 tablet (40 mg total) by mouth 2 (two) times daily.   Pitavastatin Calcium (LIVALO) 2 MG TABS Take 0.5 tablets (1 mg total) by mouth daily.   promethazine-phenylephrine (PROMETHAZINE VC) 6.25-5 MG/5ML SYRP Take 5 mLs by mouth every 6 (six) hours as needed for congestion.   temazepam (RESTORIL) 15 MG capsule Take 15 mg by mouth at bedtime.    topiramate (TOPAMAX) 100 MG tablet Take 100 mg by mouth at bedtime.    valACYclovir (VALTREX) 1000 MG tablet Take 1 tab daily for 5 days during outbreaks.   isosorbide mononitrate (IMDUR) 30 MG 24 hr tablet Take 0.5 tablets (15 mg total) by mouth daily. (Patient not taking: Reported on 11/19/2020)   No facility-administered encounter medications on file as of 12/14/2020.    Allergies (verified) Iodinated diagnostic agents, Kenalog [triamcinolone acetonide], Metrizamide, Other, Penicillins, Crestor [rosuvastatin], Lipitor [atorvastatin], Montelukast sodium, Xyzal [levocetirizine], Ciprofloxacin, Diazepam, Latex, and Sulfa antibiotics   History: Past Medical History:  Diagnosis Date   Acute bronchitis due to other specified organisms 04/21/2015   Acute hyponatremia 11/19/2020   Acute tension-type headache 12/16/2015   Adjustment disorder with anxious mood 04/21/2015   Allergic asthma with acute exacerbation 09/22/2014   Allergic rhinitis due to pollen 07/23/2014   Allergic urticaria 05/13/2015   Allergy    Alopecia 10/30/2013   Angina pectoris ([place]) 07/29/2019   Asthma    Atrophic vaginitis 10/30/2013    Basal cell carcinoma 04/21/2015   Bilateral impacted cerumen 04/21/2015   CAD (coronary artery disease) 10/28/2019   Cervical radiculopathy 09/17/2015   Change in bowel function 06/14/2016   Chest pain in adult 05/28/2017   Chest tightness 07/29/2019   Chronic diarrhea 12/16/2015   Colon polyps    Cough 02/14/2016   COVID-19 virus infection 11/19/2020   Cystocele, midline 10/30/2013   Dermatitis 09/22/2014   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   Diverticulosis of large intestine 10/30/2013   Dupuytren's contracture of both hands 09/17/2019   Dyspareunia 07/23/2014   Dysuria 12/16/2015   Encounter for long-term (current) use of other medications 10/19/2012   Esophageal dysphagia 06/14/2016   Essential hypertension    Family history of pheochromocytoma 02/14/2016   Foot pain, bilateral 01/05/2018   Gastroesophageal reflux disease    Gastroesophageal reflux disease without esophagitis 05/13/2015   Generalized abdominal pain 12/16/2015   GERD (gastroesophageal reflux disease)    H/O: hysterectomy    Herpes simplex 08/17/2016   History of adenomatous polyp of colon 06/14/2016   History of blood transfusion 1970   Hx of migraines 05/15/2009  Hypothyroidism 10/30/2013   Hypotonic bladder 10/30/2013   Insomnia 10/11/2017   Localized edema 12/16/2015   Low back pain 09/17/2015   Lumbar paraspinal muscle spasm 04/21/2015   Major depression, recurrent ([place]) 07/14/2013   Major depressive disorder, recurrent, mild ([place]) 03/14/2017   Malaise and fatigue 04/21/2015   MCI (mild cognitive impairment) 08/18/2015   Memory difficulty 09/17/2015   Menopause 03/31/2014   Midline low back pain without sciatica 03/31/2014   Migraine    Mild intermittent extrinsic asthma 12/16/2015   Mild persistent asthma 05/13/2015   Mixed dyslipidemia 11/12/2019   Nausea vomiting and diarrhea 11/19/2020   Neck pain 09/17/2015   Neuropathy 09/22/2014   Nonintractable headache 11/02/2016   Other allergic  rhinitis 05/13/2015   Palpitation 09/03/2017   Pedal edema 07/29/2019   Pelvic pain in female 02/17/2015   Personality disorder ([place]) 10/19/2012   Pharyngoesophageal dysphagia 04/21/2015   Plantar fasciitis 01/20/2016   Precordial chest pain 08/18/2015   Primary insomnia 10/11/2017   Prolapse of vaginal vault after hysterectomy 10/30/2013   Rectocele 10/30/2013   Seasonal allergies 10/15/2019   Senile purpura ([place]) 06/15/2020   Shin splint 04/21/2015   Shortness of breath 07/17/2016   Sinusitis, chronic 10/30/2013   Slow transit constipation 07/23/2014   SOB (shortness of breath) 11/19/2020   Stable angina ([facility]) 10/28/2019   Swelling of lower limb 05/17/2016   Tension-type headache, not intractable 09/17/2015   Thyroid disease    Tinnitus of both ears 10/11/2017   Unstable angina ([place]) 07/29/2019   Urethral stricture 10/30/2013   Urinary incontinence    Urinary incontinence without sensory awareness 07/17/2016   Varicose veins of both lower extremities 04/24/2016   Venous insufficiency (chronic) (peripheral) 06/06/2016   Past Surgical History:  Procedure Laterality Date   BREAST CYST ASPIRATION Left    25 years ago    COLONOSCOPY  04/2016   [facility] Diverticulitis and Multiple colon polyps   ESOPHAGOGASTRODUODENOSCOPY  04/2016   [facility]   INTRAVASCULAR PRESSURE WIRE/FFR STUDY N/A 10/17/2019   Procedure: INTRAVASCULAR PRESSURE WIRE/FFR STUDY;  Surgeon: Nelva Bush, MD;  Location: [facility];  Service: Cardiovascular;  Laterality: N/A;   LEFT HEART CATH AND CORONARY ANGIOGRAPHY N/A 10/17/2019   Procedure: LEFT HEART CATH AND CORONARY ANGIOGRAPHY;  Surgeon: Nelva Bush, MD;  Location: [facility];  Service: Cardiovascular;  Laterality: N/A;   MOHS SURGERY  10/05/2015   RECTOCELE REPAIR  2011   SKIN CANCER EXCISION  02/2015   Squamous cell removed from back   TONSILLECTOMY     VAGINAL HYSTERECTOMY     VAGINAL PROLAPSE REPAIR     Family  History  Problem Relation Age of Onset   Asthma Father    Angina Father    Emphysema Father    Alzheimer's disease Mother    Cancer Sister        died of cancer   Colon cancer Neg Hx    Esophageal cancer Neg Hx    Social History   Socioeconomic History   Marital status: Divorced    Spouse name: Not on file   Number of children: Not on file   Years of education: Not on file   Highest education level: Not on file  Occupational History   Not on file  Tobacco Use   Smoking status: Never   Smokeless tobacco: Never  Vaping Use   Vaping Use: Never used  Substance and Sexual Activity   Alcohol use: Not Currently  Drug use: No   Sexual activity: Never    Partners: Male  Other Topics Concern   Not on file  Social History Narrative   Not on file   Social Determinants of Health   Financial Resource Strain: Low Risk    Difficulty of Paying Living Expenses: Not hard at all  Food Insecurity: No Food Insecurity   Worried About Charity fundraiser in the Last Year: Never true   [place] in the Last Year: Never true  Transportation Needs: No Transportation Needs   Lack of Transportation (Medical): No   Lack of Transportation (Non-Medical): No  Physical Activity: Inactive   Days of Exercise per Week: 0 days   Minutes of Exercise per Session: 0 min  Stress: No Stress Concern Present   Feeling of Stress : Not at all  Social Connections: Moderately Integrated   Frequency of Communication with Friends and Family: More than three times a week   Frequency of Social Gatherings with Friends and Family: More than three times a week   Attends Religious Services: More than 4 times per year   Active Member of [employer] or Organizations: Yes   Attends Music therapist: More than 4 times per year   Marital Status: Divorced    Tobacco Counseling Counseling given: Not Answered   Clinical Intake:  Pre-visit preparation completed: Yes  Pain : 0-10 Pain Score: 6  Pain  Type: Acute pain Pain Location: Pelvis Pain Orientation: Lower Pain Onset: 1 to 4 weeks ago Pain Frequency: Intermittent     Nutritional Status: BMI of 19-24  Normal Nutritional Risks: None Diabetes: No  How often do you need to have someone help you when you read instructions, pamphlets, or other written materials from your doctor or pharmacy?: 1 - Never  Diabetic?No  Interpreter Needed?: No  Information entered by :: Caroleen Hamman LPN   Activities of Daily Living In your present state of health, do you have any difficulty performing the following activities: 12/14/2020 11/19/2020  Hearing? N -  Vision? N -  Difficulty concentrating or making decisions? N -  Walking or climbing stairs? N -  Dressing or bathing? N -  Doing errands, shopping? N N  Preparing Food and eating ? N -  Using the Toilet? N -  In the past six months, have you accidently leaked urine? Y -  Comment occasionlly -  Do you have problems with loss of bowel control? N -  Managing your Medications? N -  Managing your Finances? N -  Housekeeping or managing your Housekeeping? N -  Some recent data might be hidden    Patient Care Team: Shelda Pal, DO as PCP - General (Family Medicine) Revankar, Reita Cliche, MD as PCP - Cardiology (Cardiology)  Indicate any recent Medical Services you may have received from other than [facility] providers in the past year (date may be approximate).     Assessment:   This is a routine wellness examination for Taylre.  Hearing/Vision screen Vision Screening - Comments:: Last eye exam-2021  Dietary issues and exercise activities discussed: Current Exercise Habits: The patient does not participate in regular exercise at present (pt plans to start soon), Exercise limited by: None identified   Goals Addressed             This Visit's Progress    Patient Stated       Increase activity with walking & strength training       Depression Screen  PHQ 2/9 Scores  12/14/2020 07/05/2017  PHQ - 2 Score 0 0    Fall Risk Fall Risk  12/14/2020 12/09/2019 04/09/2019 07/05/2017  Falls in the past year? 0 0 0 No  Comment - Emmi Telephone Survey: data to providers prior to load Emmi Telephone Survey: data to providers prior to load -  Number falls in past yr: 0 - - -  Injury with Fall? 0 - - -  Follow up Falls prevention discussed - - -    FALL RISK PREVENTION PERTAINING TO THE HOME:  Any stairs in or around the home? Yes  If so, are there any without handrails? No  Home free of loose throw rugs in walkways, pet beds, electrical cords, etc? Yes  Adequate lighting in your home to reduce risk of falls? Yes   ASSISTIVE DEVICES UTILIZED TO PREVENT FALLS:  Life alert? Yes  Use of a cane, walker or w/c? No  Grab bars in the bathroom? Yes  Shower chair or bench in shower? No  Elevated toilet seat or a handicapped toilet? No   TIMED UP AND GO:  Was the test performed? No . Phone visit   Cognitive Function:Normal cognitive status assessed by this Nurse Health Advisor. No abnormalities found.   MMSE - Mini Mental State Exam 07/05/2017  Orientation to time 5  Orientation to Place 5  Registration 3  Attention/ Calculation 5  Recall 3  Language- name 2 objects 2  Language- repeat 1  Language- follow 3 step command 3  Language- read & follow direction 1  Write a sentence 1  Copy design 1  Total score 30        Immunizations Immunization History  Administered Date(s) Administered   Influenza, High Dose Seasonal PF 03/01/2017, 02/28/2018   Influenza,inj,Quad PF,6+ Mos 04/16/2019   Influenza,inj,quad, With Preservative 02/24/2014   Influenza-Unspecified 02/25/2013, 02/25/2013, 02/24/2014, 01/25/2015, 01/25/2015, 06/30/2016, 06/30/2016   PFIZER(Purple Top)SARS-COV-2 Vaccination 06/26/2019, 07/21/2019, 03/01/2020   Pneumococcal Polysaccharide-23 12/22/2014    TDAP status: Due, Education has been provided regarding the importance of this vaccine.  Advised may receive this vaccine at local pharmacy or Health Dept. Aware to provide a copy of the vaccination record if obtained from local pharmacy or Health Dept. Verbalized acceptance and understanding.  Flu Vaccine status: Up to date  Pneumococcal vaccine status: Due, Education has been provided regarding the importance of this vaccine. Advised may receive this vaccine at local pharmacy or Health Dept. Aware to provide a copy of the vaccination record if obtained from local pharmacy or Health Dept. Verbalized acceptance and understanding.  Covid-19 vaccine status: Information provided on how to obtain vaccines. Booster  Qualifies for Shingles Vaccine? Yes   Zostavax completed No   Shingrix Completed?: No.    Education has been provided regarding the importance of this vaccine. Patient has been advised to call insurance company to determine out of pocket expense if they have not yet received this vaccine. Advised may also receive vaccine at local pharmacy or Health Dept. Verbalized acceptance and understanding.  Screening Tests Health Maintenance  Topic Date Due   Hepatitis C Screening  Never done   TETANUS/TDAP  Never done   Zoster Vaccines- Shingrix (1 of 2) Never done   PNA vac Low Risk Adult (1 of 2 - PCV13) 12/22/2015   COLONOSCOPY (Pts 45-69yr Insurance coverage will need to be confirmed)  04/15/2019   COVID-19 Vaccine (4 - Booster for Pfizer series) 06/01/2020   INFLUENZA VACCINE  12/13/2020   MAMMOGRAM  09/22/2022   DEXA SCAN  Completed   HPV VACCINES  Aged Out    Health Maintenance  Health Maintenance Due  Topic Date Due   Hepatitis C Screening  Never done   TETANUS/TDAP  Never done   Zoster Vaccines- Shingrix (1 of 2) Never done   PNA vac Low Risk Adult (1 of 2 - PCV13) 12/22/2015   COLONOSCOPY (Pts 45-39yr Insurance coverage will need to be confirmed)  04/15/2019   COVID-19 Vaccine (4 - Booster for Pfizer series) 06/01/2020   INFLUENZA VACCINE  12/13/2020     Colorectal cancer screening. Patient sees Dr. CBryan Lemma& will call to schedule   Mammogram status: Completed Bilateral 09/21/2020. Repeat every year  Bone Density status: Completed 09/21/2020. Results reflect: Bone density results: OSTEOPENIA. Repeat every 2 years.  Lung Cancer Screening: (Low Dose CT Chest recommended if Age 71-80years, 30 pack-year currently smoking OR have quit w/in 15years.) does not qualify.     Additional Screening:  Hepatitis C Screening: does qualify; Discuss with PCP  Vision Screening: Recommended annual ophthalmology exams for early detection of glaucoma and other disorders of the eye. Is the patient up to date with their annual eye exam?  No  Who is the provider or what is the name of the office in which the patient attends annual eye exams? Patient in the process of scheduling with a new doctor   Dental Screening: Recommended annual dental exams for proper oral hygiene  Community Resource Referral / Chronic Care Management: CRR required this visit?  No   CCM required this visit?  No      Plan:     I have personally reviewed and noted the following in the patient's chart:   Medical and social history Use of alcohol, tobacco or illicit drugs  Current medications and supplements including opioid prescriptions.  Functional ability and status Nutritional status Physical activity Advanced directives List of other physicians Hospitalizations, surgeries, and ER visits in previous 12 months Vitals Screenings to include cognitive, depression, and falls Referrals and appointments  In addition, I have reviewed and discussed with patient certain preventive protocols, quality metrics, and best practice recommendations. A written personalized care plan for preventive services as well as general preventive health recommendations were provided to patient.   Due to this being a telephonic visit, the after visit summary with patients personalized plan  was offered to patient via mail or my-chart.  Patient would like to access on my-chart.   MMarta Antu LPN   [id number] Nurse Health Advisor  Nurse Notes: None

## 2020-12-15 NOTE — Telephone Encounter (Signed)
Spoke to the patient just now and let her know that I submitted the prior authorization form through covermymeds but it came back stating that a prior auth was not needed.   I think contacted [employer] and they were unable to help due to the patients insurance.   I then called Marcie Bal the drug rep that comes to our office with [employer] to see if she knew of a specific patient assistance program that we could use for this. She states that she does not know of one since [employer] does not have their own.   I advised the patient that we could try to fill out an application through [employer] to see if they could help with patient assistance and she is agreeable to try this. I sent her the link for the patient assistance application through her MyChart so that she can print it herself. She is going to fill this out and bring it back to [place] when she has completed her part.    Encouraged patient to call back with any questions or concerns.

## 2020-12-16 ENCOUNTER — Telehealth: Payer: Self-pay | Admitting: Cardiology

## 2020-12-16 NOTE — Telephone Encounter (Signed)
A user error has taken place: encounter opened in error, closed for administrative reasons.

## 2020-12-16 NOTE — Telephone Encounter (Signed)
Spoke to the patient just now and she let me know that the pharmacy told her that she could take Colestipol as an alternative to this [employer]. She would like to know if Dr. Geraldo Pitter would like to change this medication.

## 2020-12-16 NOTE — Telephone Encounter (Signed)
Pt c/o medication issue:  1. Name of Medication: icosapent Ethyl (VASCEPA) 1 g capsule  2. How are you currently taking this medication (dosage and times per day)? 2 capsules by mouth 2 times   3. Are you having a reaction (difficulty breathing--STAT)? no  4. What is your medication issue? Patient is calling to see if a off brand medication of the above will be okay for her to take. Because at the pharmacy the medication is $200 through her insurance they give her a off brand that is $30. Please advise

## 2020-12-17 MED ORDER — COLESTIPOL HCL 1 G PO TABS
1.0000 g | ORAL_TABLET | Freq: Two times a day (BID) | ORAL | 3 refills | Status: DC
Start: 2020-12-17 — End: 2020-12-22

## 2020-12-17 NOTE — Telephone Encounter (Signed)
Spoke with patient regarding results and recommendation.  Patient verbalizes understanding and is agreeable to plan of care. Advised patient to call back with any issues or concerns.  

## 2020-12-17 NOTE — Addendum Note (Signed)
Addended by: Resa Miner I on: 12/17/2020 11:48 AM   Modules accepted: Orders

## 2020-12-17 NOTE — Telephone Encounter (Signed)
Pt is returning a message to [place] from earlier today. Please advise pt further

## 2020-12-17 NOTE — Telephone Encounter (Signed)
Left message on patients voicemail to please return our call.   

## 2020-12-21 ENCOUNTER — Telehealth: Payer: Self-pay | Admitting: Cardiology

## 2020-12-21 ENCOUNTER — Other Ambulatory Visit: Payer: Self-pay | Admitting: Family Medicine

## 2020-12-21 MED ORDER — CLOTRIMAZOLE 1 % VA CREA: 1.0000 | TOPICAL_CREAM | Freq: Two times a day (BID) | VAGINAL | 0 refills | Status: AC

## 2020-12-21 NOTE — Telephone Encounter (Signed)
Pt c/o medication issue:  1. Name of Medication: colestipol (COLESTID) 1 g tablet  2. How are you currently taking this medication (dosage and times per day)? 1 table by mouth twice a day   3. Are you having a reaction (difficulty breathing--STAT)? no  4. What is your medication issue? Patient called in to say that the medication colestipol (COLESTID) 1 g tablet pill is self is too big for her to swallow, but [facility] offer her to take the powder medication cholestyramine oral packet instead. Patient called to make sure that would be okay.

## 2020-12-22 MED ORDER — CHOLESTYRAMINE 4 GM/DOSE PO POWD: 4.0000 g | Freq: Three times a day (TID) | ORAL | 12 refills | Status: DC

## 2020-12-22 NOTE — Telephone Encounter (Signed)
Pt aware RX was changed.

## 2020-12-29 ENCOUNTER — Ambulatory Visit: Payer: PPO | Admitting: Cardiology

## 2021-01-27 DIAGNOSIS — F411 Generalized anxiety disorder: Secondary | ICD-10-CM | POA: Diagnosis not present

## 2021-01-27 DIAGNOSIS — F5101 Primary insomnia: Secondary | ICD-10-CM | POA: Diagnosis not present

## 2021-01-27 DIAGNOSIS — F330 Major depressive disorder, recurrent, mild: Secondary | ICD-10-CM | POA: Diagnosis not present

## 2021-01-27 DIAGNOSIS — F609 Personality disorder, unspecified: Secondary | ICD-10-CM | POA: Diagnosis not present

## 2021-01-27 DIAGNOSIS — Z79899 Other long term (current) drug therapy: Secondary | ICD-10-CM | POA: Diagnosis not present

## 2021-01-27 DIAGNOSIS — F4322 Adjustment disorder with anxiety: Secondary | ICD-10-CM | POA: Diagnosis not present

## 2021-02-04 ENCOUNTER — Other Ambulatory Visit: Payer: Self-pay | Admitting: Family Medicine

## 2021-02-04 DIAGNOSIS — B009 Herpesviral infection, unspecified: Secondary | ICD-10-CM

## 2021-02-21 ENCOUNTER — Telehealth: Payer: Self-pay

## 2021-02-21 NOTE — Telephone Encounter (Signed)
Pharmacy notified we have never prescribe Clindamycin for this patient.

## 2021-02-23 ENCOUNTER — Ambulatory Visit (INDEPENDENT_AMBULATORY_CARE_PROVIDER_SITE_OTHER): Payer: PPO | Admitting: Family

## 2021-02-23 ENCOUNTER — Other Ambulatory Visit: Payer: Self-pay

## 2021-02-23 VITALS — BP 130/46 | HR 60 | Temp 98.0°F | Resp 16 | Wt 142.0 lb

## 2021-02-23 DIAGNOSIS — R350 Frequency of micturition: Secondary | ICD-10-CM | POA: Insufficient documentation

## 2021-02-23 HISTORY — DX: Frequency of micturition: R35.0

## 2021-02-23 LAB — POC URINALSYSI DIPSTICK (AUTOMATED)
Bilirubin, UA: NEGATIVE
Blood, UA: NEGATIVE
Glucose, UA: NEGATIVE
Ketones, UA: NEGATIVE
Nitrite, UA: NEGATIVE
Protein, UA: NEGATIVE
Spec Grav, UA: 1.015 (ref 1.010–1.025)
Urobilinogen, UA: 0.2 E.U./dL
pH, UA: 7 (ref 5.0–8.0)

## 2021-02-23 MED ORDER — NITROFURANTOIN MONOHYD MACRO 100 MG PO CAPS: 100.0000 mg | ORAL_CAPSULE | Freq: Two times a day (BID) | ORAL | 0 refills | Status: DC

## 2021-02-23 NOTE — Assessment & Plan Note (Signed)
New. Urinalysis notes + nitrites.  Will begin empiric macrobid.  (pt has multiple drug allergies).  Send for culture.

## 2021-02-23 NOTE — Progress Notes (Signed)
Subjective:     Patient ID: Jenna Campbell, female    DOB: 01-27-1951, 70 y.o.   MRN: [id number]  Chief Complaint  Patient presents with   Urinary Frequency    Complains of urinary frequency with pelvic pressure    Urinary Frequency  Associated symptoms include frequency.  Patient is in today with chief complaint of urinary frequency. Reports symptoms began about 2 weeks ago. Drinking cranberry juice.  Notes urinary frequency, pelvic pressure.    Had recent yeast infection.  Reports these symptoms have resolved.    Health Maintenance Due  Topic Date Due   Hepatitis C Screening  Never done   TETANUS/TDAP  Never done   Zoster Vaccines- Shingrix (1 of 2) Never done   COLONOSCOPY (Pts 45-51yrs Insurance coverage will need to be confirmed)  04/15/2019   COVID-19 Vaccine (4 - Booster for Pfizer series) 05/24/2020   INFLUENZA VACCINE  12/13/2020    Past Medical History:  Diagnosis Date   Acute bronchitis due to other specified organisms 04/21/2015   Acute hyponatremia 11/19/2020   Acute tension-type headache 12/16/2015   Adjustment disorder with anxious mood 04/21/2015   Allergic asthma with acute exacerbation 09/22/2014   Allergic rhinitis due to pollen 07/23/2014   Allergic urticaria 05/13/2015   Allergy    Alopecia 10/30/2013   Angina pectoris ([place]) 07/29/2019   Asthma    Atrophic vaginitis 10/30/2013   Basal cell carcinoma 04/21/2015   Bilateral impacted cerumen 04/21/2015   CAD (coronary artery disease) 10/28/2019   Cervical radiculopathy 09/17/2015   Change in bowel function 06/14/2016   Chest pain in adult 05/28/2017   Chest tightness 07/29/2019   Chronic diarrhea 12/16/2015   Colon polyps    Cough 02/14/2016   COVID-19 virus infection 11/19/2020   Cystocele, midline 10/30/2013   Dermatitis 09/22/2014   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   Diverticulosis of large intestine 10/30/2013   Dupuytren's contracture of both hands 09/17/2019   Dyspareunia 07/23/2014    Dysuria 12/16/2015   Encounter for long-term (current) use of other medications 10/19/2012   Esophageal dysphagia 06/14/2016   Essential hypertension    Family history of pheochromocytoma 02/14/2016   Foot pain, bilateral 01/05/2018   Gastroesophageal reflux disease    Gastroesophageal reflux disease without esophagitis 05/13/2015   Generalized abdominal pain 12/16/2015   GERD (gastroesophageal reflux disease)    H/O: hysterectomy    Herpes simplex 08/17/2016   History of adenomatous polyp of colon 06/14/2016   History of blood transfusion 1970   Hx of migraines 05/15/2009   Hypothyroidism 10/30/2013   Hypotonic bladder 10/30/2013   Insomnia 10/11/2017   Localized edema 12/16/2015   Low back pain 09/17/2015   Lumbar paraspinal muscle spasm 04/21/2015   Major depression, recurrent ([place]) 07/14/2013   Major depressive disorder, recurrent, mild ([place]) 03/14/2017   Malaise and fatigue 04/21/2015   MCI (mild cognitive impairment) 08/18/2015   Memory difficulty 09/17/2015   Menopause 03/31/2014   Midline low back pain without sciatica 03/31/2014   Migraine    Mild intermittent extrinsic asthma 12/16/2015   Mild persistent asthma 05/13/2015   Mixed dyslipidemia 11/12/2019   Nausea vomiting and diarrhea 11/19/2020   Neck pain 09/17/2015   Neuropathy 09/22/2014   Nonintractable headache 11/02/2016   Other allergic rhinitis 05/13/2015   Palpitation 09/03/2017   Pedal edema 07/29/2019   Pelvic pain in female 02/17/2015   Personality disorder ([place]) 10/19/2012   Pharyngoesophageal dysphagia 04/21/2015   Plantar fasciitis 01/20/2016   Precordial chest pain 08/18/2015  Primary insomnia 10/11/2017   Prolapse of vaginal vault after hysterectomy 10/30/2013   Rectocele 10/30/2013   Seasonal allergies 10/15/2019   Senile purpura ([place]) 06/15/2020   Shin splint 04/21/2015   Shortness of breath 07/17/2016   Sinusitis, chronic 10/30/2013   Slow transit constipation 07/23/2014   SOB  (shortness of breath) 11/19/2020   Stable angina ([facility]) 10/28/2019   Swelling of lower limb 05/17/2016   Tension-type headache, not intractable 09/17/2015   Thyroid disease    Tinnitus of both ears 10/11/2017   Unstable angina ([place]) 07/29/2019   Urethral stricture 10/30/2013   Urinary incontinence    Urinary incontinence without sensory awareness 07/17/2016   Varicose veins of both lower extremities 04/24/2016   Venous insufficiency (chronic) (peripheral) 06/06/2016    Past Surgical History:  Procedure Laterality Date   BREAST CYST ASPIRATION Left    25 years ago    COLONOSCOPY  04/2016   [facility] Diverticulitis and Multiple colon polyps   ESOPHAGOGASTRODUODENOSCOPY  04/2016   [facility]   INTRAVASCULAR PRESSURE WIRE/FFR STUDY N/A 10/17/2019   Procedure: INTRAVASCULAR PRESSURE WIRE/FFR STUDY;  Surgeon: Nelva Bush, MD;  Location: [facility];  Service: Cardiovascular;  Laterality: N/A;   LEFT HEART CATH AND CORONARY ANGIOGRAPHY N/A 10/17/2019   Procedure: LEFT HEART CATH AND CORONARY ANGIOGRAPHY;  Surgeon: Nelva Bush, MD;  Location: [facility];  Service: Cardiovascular;  Laterality: N/A;   MOHS SURGERY  10/05/2015   RECTOCELE REPAIR  2011   SKIN CANCER EXCISION  02/2015   Squamous cell removed from back   TONSILLECTOMY     VAGINAL HYSTERECTOMY     VAGINAL PROLAPSE REPAIR      Family History  Problem Relation Age of Onset   Asthma Father    Angina Father    Emphysema Father    Alzheimer's disease Mother    Cancer Sister        died of cancer   Colon cancer Neg Hx    Esophageal cancer Neg Hx     Social History   Socioeconomic History   Marital status: Divorced    Spouse name: Not on file   Number of children: Not on file   Years of education: Not on file   Highest education level: Not on file  Occupational History   Not on file  Tobacco Use   Smoking status: Never   Smokeless tobacco: Never  Vaping Use   Vaping Use: Never used   Substance and Sexual Activity   Alcohol use: Not Currently   Drug use: No   Sexual activity: Never    Partners: Male  Other Topics Concern   Not on file  Social History Narrative   Not on file   Social Determinants of Health   Financial Resource Strain: Low Risk    Difficulty of Paying Living Expenses: Not hard at all  Food Insecurity: No Food Insecurity   Worried About Charity fundraiser in the Last Year: Never true   [place] in the Last Year: Never true  Transportation Needs: No Transportation Needs   Lack of Transportation (Medical): No   Lack of Transportation (Non-Medical): No  Physical Activity: Inactive   Days of Exercise per Week: 0 days   Minutes of Exercise per Session: 0 min  Stress: No Stress Concern Present   Feeling of Stress : Not at all  Social Connections: Moderately Integrated   Frequency of Communication with Friends and Family: More than  three times a week   Frequency of Social Gatherings with Friends and Family: More than three times a week   Attends Religious Services: More than 4 times per year   Active Member of Clubs or Organizations: Yes   Attends Music therapist: More than 4 times per year   Marital Status: Divorced  Human resources officer Violence: Not At Risk   Fear of Current or Ex-Partner: No   Emotionally Abused: No   Physically Abused: No   Sexually Abused: No    Outpatient Medications Prior to Visit  Medication Sig Dispense Refill   acetaminophen (TYLENOL) 500 MG tablet Take 500 mg by mouth every 6 (six) hours as needed for moderate pain.     albuterol (PROAIR HFA) 108 (90 Base) MCG/ACT inhaler Inhale 2 puffs into the lungs every 4 (four) hours as needed for wheezing or shortness of breath. 18 g 1   albuterol (PROVENTIL) (2.5 MG/3ML) 0.083% nebulizer solution INHALE 3 ML BY NEBULIZATION EVERY 6 HOURS AS NEEDED FOR WHEEZING OR SHORTNESS OF BREATH 75 mL 0   ASPIRIN 81 PO Take 81 mg by mouth daily.      azelastine  (ASTELIN) 0.1 % nasal spray Place 2 sprays into both nostrils 2 (two) times daily. Use in each nostril as directed 30 mL 12   betamethasone valerate (VALISONE) 0.1 % cream Apply 1 application topically daily.     cholecalciferol (VITAMIN D3) 25 MCG (1000 UNIT) tablet Take 1,000 Units by mouth daily.     cholestyramine (QUESTRAN) 4 GM/DOSE powder Take 1 packet (4 g total) by mouth 3 (three) times daily with meals. 378 g 12   Coenzyme Q10 (CO Q-10) 200 MG CAPS Take 200 mg by mouth daily. 90 capsule 3   fluticasone (FLONASE) 50 MCG/ACT nasal spray Place 2 sprays into both nostrils as needed for rhinitis or allergies.     fluticasone-salmeterol (ADVAIR DISKUS) 250-50 MCG/ACT AEPB TAKE 1 PUFF BY MOUTH TWICE A DAY 60 each 2   gabapentin (NEURONTIN) 300 MG capsule Take 300 mg by mouth in the morning.     ibuprofen (ADVIL) 200 MG tablet Take 400 mg by mouth every 6 (six) hours as needed for mild pain.     ipratropium-albuterol (DUONEB) 0.5-2.5 (3) MG/3ML SOLN Take 3 mLs by nebulization every 4 (four) hours as needed for wheezing or shortness of breath.     levocetirizine (XYZAL) 5 MG tablet Take 5 mg by mouth daily.     levothyroxine (SYNTHROID) 150 MCG tablet Take 1.5 tablets (225 mcg total) by mouth daily before breakfast. 135 tablet 2   LORazepam (ATIVAN) 0.5 MG tablet Take 0.5 mg by mouth every 8 (eight) hours as needed for anxiety.      nitroGLYCERIN (NITROSTAT) 0.4 MG SL tablet Place 0.4 mg under the tongue every 5 (five) minutes as needed for chest pain.     olopatadine (PATANOL) 0.1 % ophthalmic solution Place 1 drop into both eyes 2 (two) times daily. 5 mL 1   ondansetron (ZOFRAN-ODT) 4 MG disintegrating tablet Take 1 tablet (4 mg total) by mouth every 8 (eight) hours as needed for nausea or vomiting. 20 tablet 0   pantoprazole (PROTONIX) 40 MG tablet Take 1 tablet (40 mg total) by mouth 2 (two) times daily. 60 tablet 3   Pitavastatin Calcium (LIVALO) 2 MG TABS Take 0.5 tablets (1 mg total) by  mouth daily. 45 tablet 3   promethazine-phenylephrine (PROMETHAZINE VC) 6.25-5 MG/5ML SYRP Take 5 mLs by mouth every 6 (  six) hours as needed for congestion. 118 mL 0   temazepam (RESTORIL) 15 MG capsule Take 15 mg by mouth at bedtime.      topiramate (TOPAMAX) 100 MG tablet Take 100 mg by mouth at bedtime.      valACYclovir (VALTREX) 1000 MG tablet TAKE 1 TABLET BY MOUTH ONCE DAILY FOR 5 DAYS DURING OUTBREAKS. 90 tablet 0   isosorbide mononitrate (IMDUR) 30 MG 24 hr tablet Take 0.5 tablets (15 mg total) by mouth daily. (Patient not taking: Reported on 11/19/2020) 30 tablet 5   benzonatate (TESSALON) 100 MG capsule Take 100 mg by mouth 3 (three) times daily as needed for cough.     No facility-administered medications prior to visit.    Allergies  Allergen Reactions   Iodinated Diagnostic Agents Itching, Other (See Comments), Rash and Shortness Of Breath    Throat Swelling, Erythema   Kenalog [Triamcinolone Acetonide] Swelling   Metrizamide Itching, Other (See Comments), Rash and Shortness Of Breath    Throat Swelling, Erythema   Other Itching and Swelling    DUST, spicy food, red sause   Penicillins Anaphylaxis and Itching   Crestor [Rosuvastatin] Other (See Comments)    myalgia   Lipitor [Atorvastatin] Other (See Comments)    myalgia   Montelukast Sodium Other (See Comments)    Chest pain, dizziness and lightheadedness   Xyzal [Levocetirizine]     Chest pressure   Ciprofloxacin Itching   Diazepam Rash   Latex Itching, Rash and Swelling   Sulfa Antibiotics Rash    Review of Systems  Genitourinary:  Positive for frequency.      Objective:    Physical Exam Constitutional:      Appearance: She is well-developed.  Cardiovascular:     Rate and Rhythm: Normal rate and regular rhythm.     Heart sounds: Normal heart sounds. No murmur heard. Pulmonary:     Effort: Pulmonary effort is normal. No respiratory distress.     Breath sounds: Normal breath sounds. No wheezing.   Abdominal:     Tenderness: There is abdominal tenderness in the suprapubic area.  Psychiatric:        Mood and Affect: Affect is tearful (upset about loss of family friend in murder suicide locally).        Speech: Speech normal.        Behavior: Behavior normal.        Thought Content: Thought content normal.        Judgment: Judgment normal.    BP (!) 130/46 (BP Location: Right Arm, Patient Position: Sitting, Cuff Size: Small)   Pulse 60   Temp 98 F (36.7 C) (Oral)   Resp 16   Wt 142 lb (64.4 kg)   SpO2 98%   BMI 22.58 kg/m  Wt Readings from Last 3 Encounters:  02/23/21 142 lb (64.4 kg)  12/14/20 143 lb (64.9 kg)  12/13/20 143 lb (64.9 kg)       Assessment & Plan:   Problem List Items Addressed This Visit       Unprioritized   Urinary frequency - Primary    New. Urinalysis notes + nitrites.  Will begin empiric macrobid.  (pt has multiple drug allergies).  Send for culture.       Relevant Orders   POCT Urinalysis Dipstick (Automated) (Completed)   Urine Culture    I have discontinued Isebella Pam's benzonatate. I am also having her start on nitrofurantoin (macrocrystal-monohydrate). Additionally, I am having her maintain her LORazepam, topiramate, gabapentin,  temazepam, olopatadine, isosorbide mononitrate, ASPIRIN 81 PO, pantoprazole, albuterol, levocetirizine, betamethasone valerate, nitroGLYCERIN, [place], Co Q-10, azelastine, levothyroxine, fluticasone-salmeterol, albuterol, acetaminophen, ibuprofen, cholecalciferol, ondansetron, promethazine-phenylephrine, ipratropium-albuterol, fluticasone, cholestyramine, and valACYclovir.  Meds ordered this encounter  Medications   nitrofurantoin, macrocrystal-monohydrate, (MACROBID) 100 MG capsule    Sig: Take 1 capsule (100 mg total) by mouth 2 (two) times daily.    Dispense:  10 capsule    Refill:  0    Order Specific Question:   Supervising Provider    Answer:   Penni Homans A [[id number]]

## 2021-02-23 NOTE — Patient Instructions (Addendum)
Please begin macrobid twice daily for urinary tract infection. Let us know if that spot on your lower abdomen does not resolve in the next 1-2 weeks and we will plan a referral to dermatology.

## 2021-02-25 ENCOUNTER — Other Ambulatory Visit: Payer: PPO

## 2021-02-25 ENCOUNTER — Other Ambulatory Visit: Payer: Self-pay

## 2021-02-25 DIAGNOSIS — R350 Frequency of micturition: Secondary | ICD-10-CM

## 2021-02-25 NOTE — Addendum Note (Signed)
Addended by: Kelle Darting A on: 02/25/2021 01:21 PM   Modules accepted: Orders

## 2021-02-26 LAB — URINE CULTURE
MICRO NUMBER:: 12504482
SPECIMEN QUALITY:: ADEQUATE

## 2021-02-27 ENCOUNTER — Encounter: Payer: Self-pay | Admitting: Family

## 2021-02-28 DIAGNOSIS — F332 Major depressive disorder, recurrent severe without psychotic features: Secondary | ICD-10-CM | POA: Diagnosis not present

## 2021-03-15 DIAGNOSIS — L820 Inflamed seborrheic keratosis: Secondary | ICD-10-CM | POA: Diagnosis not present

## 2021-03-15 DIAGNOSIS — L281 Prurigo nodularis: Secondary | ICD-10-CM | POA: Diagnosis not present

## 2021-03-15 DIAGNOSIS — Z85828 Personal history of other malignant neoplasm of skin: Secondary | ICD-10-CM | POA: Diagnosis not present

## 2021-03-15 DIAGNOSIS — Z86008 Personal history of in-situ neoplasm of other site: Secondary | ICD-10-CM | POA: Diagnosis not present

## 2021-03-15 DIAGNOSIS — L821 Other seborrheic keratosis: Secondary | ICD-10-CM | POA: Diagnosis not present

## 2021-03-18 ENCOUNTER — Telehealth: Payer: Self-pay

## 2021-03-18 NOTE — Telephone Encounter (Signed)
Pt called stating she recently saw on TV a commercial about testing for monoclonal antibody levels and she is wondering if that is something she can have done.  Also pt wanted PCP to be aware she is going to be moving to [facility] in early December and she is very excited about this and feels like it is going to be a great fit for her health and wellbeing.

## 2021-03-21 NOTE — Telephone Encounter (Signed)
Patient informed of PCP response °

## 2021-03-21 NOTE — Telephone Encounter (Signed)
I am not aware of monoclonal antibody testing. There is a treatment class that falls in this category, but I don't routinely write for those in the clinic. Happy she will be in a better living environment.

## 2021-05-05 ENCOUNTER — Other Ambulatory Visit: Payer: Self-pay | Admitting: Family Medicine

## 2021-05-05 DIAGNOSIS — B009 Herpesviral infection, unspecified: Secondary | ICD-10-CM

## 2021-05-13 ENCOUNTER — Encounter: Payer: Self-pay | Admitting: Family Medicine

## 2021-05-13 ENCOUNTER — Ambulatory Visit (INDEPENDENT_AMBULATORY_CARE_PROVIDER_SITE_OTHER): Payer: PPO | Admitting: Family Medicine

## 2021-05-13 VITALS — BP 118/72 | HR 58 | Temp 98.0°F | Ht 66.5 in | Wt 140.1 lb

## 2021-05-13 DIAGNOSIS — R102 Pelvic and perineal pain: Secondary | ICD-10-CM

## 2021-05-13 DIAGNOSIS — Z23 Encounter for immunization: Secondary | ICD-10-CM | POA: Diagnosis not present

## 2021-05-13 DIAGNOSIS — R29898 Other symptoms and signs involving the musculoskeletal system: Secondary | ICD-10-CM

## 2021-05-13 DIAGNOSIS — Z0000 Encounter for general adult medical examination without abnormal findings: Secondary | ICD-10-CM

## 2021-05-13 LAB — URINALYSIS, ROUTINE W REFLEX MICROSCOPIC
Bilirubin Urine: NEGATIVE
Hgb urine dipstick: NEGATIVE
Ketones, ur: NEGATIVE
Nitrite: NEGATIVE
RBC / HPF: NONE SEEN (ref 0–?)
Specific Gravity, Urine: 1.01 (ref 1.000–1.030)
Total Protein, Urine: NEGATIVE
Urine Glucose: NEGATIVE
Urobilinogen, UA: 0.2 (ref 0.0–1.0)
pH: 7 (ref 5.0–8.0)

## 2021-05-13 LAB — LIPID PANEL
Cholesterol: 140 mg/dL (ref 0–200)
HDL: 35.9 mg/dL — ABNORMAL LOW (ref >39.00)
LDL Cholesterol: 73 mg/dL (ref 0–99)
NonHDL: 104.58
Total CHOL/HDL Ratio: 4
Triglycerides: 159 mg/dL — ABNORMAL HIGH (ref 0.0–149.0)
VLDL: 31.8 mg/dL (ref 0.0–40.0)

## 2021-05-13 LAB — COMPREHENSIVE METABOLIC PANEL
ALT: 17 U/L (ref 0–35)
AST: 16 U/L (ref 0–37)
Albumin: 3.8 g/dL (ref 3.5–5.2)
Alkaline Phosphatase: 55 U/L (ref 39–117)
BUN: 8 mg/dL (ref 6–23)
CO2: 27 mEq/L (ref 19–32)
Calcium: 8.8 mg/dL (ref 8.4–10.5)
Chloride: 105 mEq/L (ref 96–112)
Creatinine, Ser: 0.75 mg/dL (ref 0.40–1.20)
GFR: 80.19 mL/min (ref >60.00)
Glucose, Bld: 74 mg/dL (ref 70–99)
Potassium: 3.8 mEq/L (ref 3.5–5.1)
Sodium: 138 mEq/L (ref 135–145)
Total Bilirubin: 0.5 mg/dL (ref 0.2–1.2)
Total Protein: 6.2 g/dL (ref 6.0–8.3)

## 2021-05-13 NOTE — Progress Notes (Signed)
Chief Complaint  Patient presents with   Annual Exam     Well Woman Jenna Campbell is here for a complete physical.   Her last physical was >1 year ago.  Current diet: in general, a "healthy" diet. Current exercise: walking, currently in process of moving. Weight is stable and she denies daytime fatigue. Seatbelt? Yes Advanced directive? No  Health Maintenance Colonoscopy- Due; GI had postponed due to other health issues she was dealing with at the time of her original f/u Shingrix- No DEXA- Yes Mammogram- Yes Tetanus- Due Pneumonia- Due for PCV20 Hep C screen- Yes  Hand weakness Chronic issue, more noticeable this past week. No inj or change in activity. Has not tried anything in past. Denies pain, redness, bruising, swelling.   Past Medical History:  Diagnosis Date   Acute bronchitis due to other specified organisms 04/21/2015   Acute hyponatremia 11/19/2020   Acute tension-type headache 12/16/2015   Adjustment disorder with anxious mood 04/21/2015   Allergic asthma with acute exacerbation 09/22/2014   Allergic rhinitis due to pollen 07/23/2014   Allergic urticaria 05/13/2015   Allergy    Alopecia 10/30/2013   Angina pectoris ([place]) 07/29/2019   Asthma    Atrophic vaginitis 10/30/2013   Basal cell carcinoma 04/21/2015   Bilateral impacted cerumen 04/21/2015   CAD (coronary artery disease) 10/28/2019   Cervical radiculopathy 09/17/2015   Change in bowel function 06/14/2016   Chest pain in adult 05/28/2017   Chest tightness 07/29/2019   Chronic diarrhea 12/16/2015   Colon polyps    Cough 02/14/2016   COVID-19 virus infection 11/19/2020   Cystocele, midline 10/30/2013   Dermatitis 09/22/2014   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   Diverticulosis of large intestine 10/30/2013   Dupuytren's contracture of both hands 09/17/2019   Dyspareunia 07/23/2014   Dysuria 12/16/2015   Encounter for long-term (current) use of other medications 10/19/2012   Esophageal dysphagia  06/14/2016   Essential hypertension    Family history of pheochromocytoma 02/14/2016   Foot pain, bilateral 01/05/2018   Gastroesophageal reflux disease    Gastroesophageal reflux disease without esophagitis 05/13/2015   Generalized abdominal pain 12/16/2015   GERD (gastroesophageal reflux disease)    H/O: hysterectomy    Herpes simplex 08/17/2016   History of adenomatous polyp of colon 06/14/2016   History of blood transfusion 1970   Hx of migraines 05/15/2009   Hypothyroidism 10/30/2013   Hypotonic bladder 10/30/2013   Insomnia 10/11/2017   Localized edema 12/16/2015   Low back pain 09/17/2015   Lumbar paraspinal muscle spasm 04/21/2015   Major depression, recurrent ([place]) 07/14/2013   Major depressive disorder, recurrent, mild ([place]) 03/14/2017   Malaise and fatigue 04/21/2015   MCI (mild cognitive impairment) 08/18/2015   Memory difficulty 09/17/2015   Menopause 03/31/2014   Midline low back pain without sciatica 03/31/2014   Migraine    Mild intermittent extrinsic asthma 12/16/2015   Mild persistent asthma 05/13/2015   Mixed dyslipidemia 11/12/2019   Nausea vomiting and diarrhea 11/19/2020   Neck pain 09/17/2015   Neuropathy 09/22/2014   Nonintractable headache 11/02/2016   Other allergic rhinitis 05/13/2015   Palpitation 09/03/2017   Pedal edema 07/29/2019   Pelvic pain in female 02/17/2015   Personality disorder ([place]) 10/19/2012   Pharyngoesophageal dysphagia 04/21/2015   Plantar fasciitis 01/20/2016   Precordial chest pain 08/18/2015   Primary insomnia 10/11/2017   Prolapse of vaginal vault after hysterectomy 10/30/2013   Rectocele 10/30/2013   Seasonal allergies 10/15/2019   Senile purpura ([place]) 06/15/2020  Shin splint 04/21/2015   Shortness of breath 07/17/2016   Sinusitis, chronic 10/30/2013   Slow transit constipation 07/23/2014   SOB (shortness of breath) 11/19/2020   Stable angina ([facility]) 10/28/2019   Swelling of lower limb 05/17/2016   Tension-type  headache, not intractable 09/17/2015   Thyroid disease    Tinnitus of both ears 10/11/2017   Unstable angina ([place]) 07/29/2019   Urethral stricture 10/30/2013   Urinary incontinence    Urinary incontinence without sensory awareness 07/17/2016   Varicose veins of both lower extremities 04/24/2016   Venous insufficiency (chronic) (peripheral) 06/06/2016     Past Surgical History:  Procedure Laterality Date   BREAST CYST ASPIRATION Left    25 years ago    COLONOSCOPY  04/2016   [facility] Diverticulitis and Multiple colon polyps   ESOPHAGOGASTRODUODENOSCOPY  04/2016   [facility]   INTRAVASCULAR PRESSURE WIRE/FFR STUDY N/A 10/17/2019   Procedure: INTRAVASCULAR PRESSURE WIRE/FFR STUDY;  Surgeon: Nelva Bush, MD;  Location: [facility];  Service: Cardiovascular;  Laterality: N/A;   LEFT HEART CATH AND CORONARY ANGIOGRAPHY N/A 10/17/2019   Procedure: LEFT HEART CATH AND CORONARY ANGIOGRAPHY;  Surgeon: Nelva Bush, MD;  Location: [facility];  Service: Cardiovascular;  Laterality: N/A;   MOHS SURGERY  10/05/2015   RECTOCELE REPAIR  2011   SKIN CANCER EXCISION  02/2015   Squamous cell removed from back   TONSILLECTOMY     VAGINAL HYSTERECTOMY     VAGINAL PROLAPSE REPAIR      Medications  Current Outpatient Medications on File Prior to Visit  Medication Sig Dispense Refill   acetaminophen (TYLENOL) 500 MG tablet Take 500 mg by mouth every 6 (six) hours as needed for moderate pain.     albuterol (PROAIR HFA) 108 (90 Base) MCG/ACT inhaler Inhale 2 puffs into the lungs every 4 (four) hours as needed for wheezing or shortness of breath. 18 g 1   albuterol (PROVENTIL) (2.5 MG/3ML) 0.083% nebulizer solution INHALE 3 ML BY NEBULIZATION EVERY 6 HOURS AS NEEDED FOR WHEEZING OR SHORTNESS OF BREATH 75 mL 0   ASPIRIN 81 PO Take 81 mg by mouth daily.      azelastine (ASTELIN) 0.1 % nasal spray Place 2 sprays into both nostrils 2 (two) times daily. Use in each nostril as  directed 30 mL 12   betamethasone valerate (VALISONE) 0.1 % cream Apply 1 application topically daily.     cholecalciferol (VITAMIN D3) 25 MCG (1000 UNIT) tablet Take 1,000 Units by mouth daily.     cholestyramine (QUESTRAN) 4 GM/DOSE powder Take 1 packet (4 g total) by mouth 3 (three) times daily with meals. 378 g 12   Coenzyme Q10 (CO Q-10) 200 MG CAPS Take 200 mg by mouth daily. 90 capsule 3   fluticasone (FLONASE) 50 MCG/ACT nasal spray Place 2 sprays into both nostrils as needed for rhinitis or allergies.     fluticasone-salmeterol (ADVAIR DISKUS) 250-50 MCG/ACT AEPB TAKE 1 PUFF BY MOUTH TWICE A DAY 60 each 2   gabapentin (NEURONTIN) 300 MG capsule Take 300 mg by mouth in the morning.     ibuprofen (ADVIL) 200 MG tablet Take 400 mg by mouth every 6 (six) hours as needed for mild pain.     ipratropium-albuterol (DUONEB) 0.5-2.5 (3) MG/3ML SOLN Take 3 mLs by nebulization every 4 (four) hours as needed for wheezing or shortness of breath.     levocetirizine (XYZAL) 5 MG tablet Take 5 mg by mouth daily.  levothyroxine (SYNTHROID) 150 MCG tablet Take 1.5 tablets (225 mcg total) by mouth daily before breakfast. 135 tablet 2   LORazepam (ATIVAN) 0.5 MG tablet Take 0.5 mg by mouth every 8 (eight) hours as needed for anxiety.      nitrofurantoin, macrocrystal-monohydrate, (MACROBID) 100 MG capsule Take 1 capsule (100 mg total) by mouth 2 (two) times daily. 10 capsule 0   nitroGLYCERIN (NITROSTAT) 0.4 MG SL tablet Place 0.4 mg under the tongue every 5 (five) minutes as needed for chest pain.     olopatadine (PATANOL) 0.1 % ophthalmic solution Place 1 drop into both eyes 2 (two) times daily. 5 mL 1   ondansetron (ZOFRAN-ODT) 4 MG disintegrating tablet Take 1 tablet (4 mg total) by mouth every 8 (eight) hours as needed for nausea or vomiting. 20 tablet 0   pantoprazole (PROTONIX) 40 MG tablet Take 1 tablet (40 mg total) by mouth 2 (two) times daily. 60 tablet 3   Pitavastatin Calcium (LIVALO) 2 MG TABS  Take 0.5 tablets (1 mg total) by mouth daily. 45 tablet 3   promethazine-phenylephrine (PROMETHAZINE VC) 6.25-5 MG/5ML SYRP Take 5 mLs by mouth every 6 (six) hours as needed for congestion. 118 mL 0   temazepam (RESTORIL) 15 MG capsule Take 15 mg by mouth at bedtime.      topiramate (TOPAMAX) 100 MG tablet Take 100 mg by mouth at bedtime.      valACYclovir (VALTREX) 1000 MG tablet TAKE 1 TABLET BY MOUTH ONCE DAILY FOR 5 DAYS DURING OUTBREAKS. 90 tablet 0    Allergies Allergies  Allergen Reactions   Iodinated Contrast Media Itching, Other (See Comments), Rash and Shortness Of Breath    Throat Swelling, Erythema   Kenalog [Triamcinolone Acetonide] Swelling   Metrizamide Itching, Other (See Comments), Rash and Shortness Of Breath    Throat Swelling, Erythema   Other Itching and Swelling    DUST, spicy food, red sause   Penicillins Anaphylaxis and Itching   Crestor [Rosuvastatin] Other (See Comments)    myalgia   Lipitor [Atorvastatin] Other (See Comments)    myalgia   Montelukast Sodium Other (See Comments)    Chest pain, dizziness and lightheadedness   Xyzal [Levocetirizine]     Chest pressure   Ciprofloxacin Itching   Diazepam Rash   Latex Itching, Rash and Swelling   Sulfa Antibiotics Rash    Review of Systems: Constitutional:  no fevers Eye:  no recent significant change in vision Ears:  No changes in hearing Nose/Mouth/Throat:  no complaints of nasal congestion, no sore throat Cardiovascular: no current chest pain Respiratory:  No current shortness of breath Gastrointestinal:  +constipation GU:  Female: +pelvic pain Integumentary:  no abnormal skin lesions reported Neurologic:  +hand weakness Endocrine:  denies unexplained weight changes  Exam BP 118/72    Pulse (!) 58    Temp 98 F (36.7 C) (Oral)    Ht 5' 6.5" (1.689 m)    Wt 140 lb 2 oz (63.6 kg)    SpO2 99%    BMI 22.28 kg/m  General:  well developed, well nourished, in no apparent distress Skin:  no  significant moles, warts, or growths Head:  no masses, lesions, or tenderness Eyes:  pupils equal and round, sclera anicteric without injection Ears:  canals without lesions, TMs shiny without retraction, no obvious effusion, no erythema Nose:  nares patent, septum midline, mucosa normal, and no drainage or sinus tenderness Throat/Pharynx:  lips and gingiva without lesion; tongue and uvula midline; non-inflamed pharynx; no  exudates or postnasal drainage Neck: neck supple without adenopathy, thyromegaly, or masses Lungs:  clear to auscultation, breath sounds equal bilaterally, no respiratory distress Cardio:  regular rate and rhythm, no bruits or LE edema Abdomen:  abdomen soft, ttp in suprapubic region; bowel sounds normal; no masses or organomegaly Genital: Deferred Neuro: 4/5 grip strength b/l, gait normal; deep tendon reflexes normal and symmetric Psych: well oriented with normal range of affect and appropriate judgment/insight  Assessment and Plan  Well adult exam - Plan: Comprehensive metabolic panel, Lipid panel, Urinalysis  Pelvic pain in female  Weakness of both hands - Plan: Ambulatory referral to Occupational Therapy   Well 70 y.o. female. Counseled on diet and exercise. Other orders as above. Advanced directive form provided today.  Bivalent COVID vaccination booster recommended.  Will reach out to GI after they postponed due to other health issues.  Hand weakness-refer occ therapy, likely deconditioning.  Shingrix rec'd.  Tdap and PCV20 today.  Follow up 6 mo. The patient voiced understanding and agreement to the plan.  [place], DO 05/13/21 9:36 AM

## 2021-05-13 NOTE — Addendum Note (Signed)
Addended by: Sharon Seller B on: 05/13/2021 09:45 AM   Modules accepted: Orders

## 2021-05-13 NOTE — Patient Instructions (Addendum)
Give [place] 2-3 business days to get the results of your labs back.   Keep the diet clean and stay active.  The new Shingrix vaccine (for shingles) is a 2 shot series. It can make people feel low energy, achy and almost like they have the flu for 48 hours after injection. Please plan accordingly when deciding on when to get this shot. Call our office for a nurse visit appointment to get this. The second shot of the series is less severe regarding the side effects, but it still lasts 48 hours.   Schedule a follow up with your GI team for a colonoscopy.   I recommend getting the updated bivalent covid vaccination booster at your convenience.   If you do not hear anything about your referral in the next 1-2 weeks, call our office and ask for an update.  Let us know if you need anything.

## 2021-05-23 ENCOUNTER — Other Ambulatory Visit: Payer: Self-pay | Admitting: Internal Medicine

## 2021-05-23 DIAGNOSIS — F329 Major depressive disorder, single episode, unspecified: Secondary | ICD-10-CM | POA: Diagnosis not present

## 2021-05-23 NOTE — Telephone Encounter (Signed)
Refill request

## 2021-05-26 ENCOUNTER — Telehealth: Payer: Self-pay | Admitting: Cardiology

## 2021-05-26 ENCOUNTER — Ambulatory Visit: Payer: PPO | Admitting: Cardiology

## 2021-05-26 NOTE — Telephone Encounter (Signed)
Patient needs to cancel appt on today because she is suck. Next available day is not til April. She wants to make sure its okay to wait that long. Please advise

## 2021-05-26 NOTE — Telephone Encounter (Signed)
Appointment changed to 07/01/21.

## 2021-06-13 DIAGNOSIS — F329 Major depressive disorder, single episode, unspecified: Secondary | ICD-10-CM | POA: Diagnosis not present

## 2021-07-01 ENCOUNTER — Encounter: Payer: Self-pay | Admitting: Cardiology

## 2021-07-01 ENCOUNTER — Emergency Department (HOSPITAL_BASED_OUTPATIENT_CLINIC_OR_DEPARTMENT_OTHER)
Admission: EM | Admit: 2021-07-01 | Discharge: 2021-07-01 | Disposition: A | Discharge status: Home / Self Care | Payer: PPO | Attending: Emergency Medicine | Admitting: Emergency Medicine

## 2021-07-01 ENCOUNTER — Encounter (HOSPITAL_BASED_OUTPATIENT_CLINIC_OR_DEPARTMENT_OTHER): Payer: Self-pay

## 2021-07-01 ENCOUNTER — Emergency Department (HOSPITAL_BASED_OUTPATIENT_CLINIC_OR_DEPARTMENT_OTHER): Payer: PPO

## 2021-07-01 ENCOUNTER — Ambulatory Visit: Payer: PPO | Admitting: Cardiology

## 2021-07-01 ENCOUNTER — Other Ambulatory Visit: Payer: Self-pay

## 2021-07-01 VITALS — BP 116/68 | HR 50 | Ht 66.0 in | Wt 139.1 lb

## 2021-07-01 DIAGNOSIS — M7989 Other specified soft tissue disorders: Secondary | ICD-10-CM | POA: Diagnosis not present

## 2021-07-01 DIAGNOSIS — I10 Essential (primary) hypertension: Secondary | ICD-10-CM | POA: Insufficient documentation

## 2021-07-01 DIAGNOSIS — Z91040 Latex allergy status: Secondary | ICD-10-CM | POA: Insufficient documentation

## 2021-07-01 DIAGNOSIS — Z79899 Other long term (current) drug therapy: Secondary | ICD-10-CM | POA: Diagnosis not present

## 2021-07-01 DIAGNOSIS — R21 Rash and other nonspecific skin eruption: Secondary | ICD-10-CM | POA: Insufficient documentation

## 2021-07-01 DIAGNOSIS — Z7982 Long term (current) use of aspirin: Secondary | ICD-10-CM | POA: Diagnosis not present

## 2021-07-01 DIAGNOSIS — E782 Mixed hyperlipidemia: Secondary | ICD-10-CM | POA: Diagnosis not present

## 2021-07-01 DIAGNOSIS — I2510 Atherosclerotic heart disease of native coronary artery without angina pectoris: Secondary | ICD-10-CM | POA: Insufficient documentation

## 2021-07-01 DIAGNOSIS — R600 Localized edema: Secondary | ICD-10-CM | POA: Insufficient documentation

## 2021-07-01 DIAGNOSIS — R079 Chest pain, unspecified: Secondary | ICD-10-CM | POA: Diagnosis not present

## 2021-07-01 DIAGNOSIS — R0602 Shortness of breath: Secondary | ICD-10-CM | POA: Diagnosis not present

## 2021-07-01 LAB — BASIC METABOLIC PANEL
Anion gap: 6 (ref 5–15)
BUN: 10 mg/dL (ref 8–23)
CO2: 24 mmol/L (ref 22–32)
Calcium: 8.6 mg/dL — ABNORMAL LOW (ref 8.9–10.3)
Chloride: 102 mmol/L (ref 98–111)
Creatinine, Ser: 0.7 mg/dL (ref 0.44–1.00)
GFR, Estimated: >60 mL/min (ref >60)
Glucose, Bld: 101 mg/dL — ABNORMAL HIGH (ref 70–99)
Potassium: 3.8 mmol/L (ref 3.5–5.1)
Sodium: 132 mmol/L — ABNORMAL LOW (ref 135–145)

## 2021-07-01 LAB — CBC
HCT: 36.2 % (ref 36.0–46.0)
Hemoglobin: 12.5 g/dL (ref 12.0–15.0)
MCH: 30.6 pg (ref 26.0–34.0)
MCHC: 34.5 g/dL (ref 30.0–36.0)
MCV: 88.5 fL (ref 80.0–100.0)
Platelets: 246 10*3/uL (ref 150–400)
RBC: 4.09 MIL/uL (ref 3.87–5.11)
RDW: 12.5 % (ref 11.5–15.5)
WBC: 5.4 10*3/uL (ref 4.0–10.5)
nRBC: 0 % (ref 0.0–0.2)

## 2021-07-01 LAB — TROPONIN I (HIGH SENSITIVITY): Troponin I (High Sensitivity): 6 ng/L (ref <18)

## 2021-07-01 MED ORDER — FUROSEMIDE 20 MG PO TABS: 20.0000 mg | ORAL_TABLET | Freq: Every morning | ORAL | 0 refills | Status: DC

## 2021-07-01 MED ORDER — VALACYCLOVIR HCL 1 G PO TABS: 1000.0000 mg | ORAL_TABLET | Freq: Three times a day (TID) | ORAL | 0 refills | Status: AC

## 2021-07-01 NOTE — Progress Notes (Signed)
Cardiology Office Note:    Date:  07/01/2021   ID:  Jenna Campbell, DOB 1950/06/17, MRN [id number]  PCP:  Shelda Pal, DO  Cardiologist:  Jenean Lindau, MD   Referring MD: Shelda Pal*    ASSESSMENT:    1. Coronary artery disease involving native coronary artery of native heart without angina pectoris   2. Essential hypertension   3. Mixed hyperlipidemia    PLAN:    In order of problems listed above:  Coronary artery disease: Secondary prevention stressed with the patient.  Importance of compliance with diet medication stressed and she vocalized understanding. Pedal edema right greater than left.  I spoke to the emergency room physician downstairs and we are sending her down for that evaluation.  She also has a rash on the left side of the abdomen this will be also reviewed by them. Essential hypertension: Blood pressure stable and diet was emphasized. Mixed dyslipidemia: Lipids were reviewed and they are fine and she is on statin therapy. Patient will be seen in follow-up appointment in 6 months or earlier if the patient has any concerns    Medication Adjustments/Labs and Tests Ordered: Current medicines are reviewed at length with the patient today.  Concerns regarding medicines are outlined above.  No orders of the defined types were placed in this encounter.  No orders of the defined types were placed in this encounter.    No chief complaint on file.    History of Present Illness:    Jenna Campbell is a 71 y.o. female.  Patient has past medical history of coronary artery disease, essential hypertension and dyslipidemia.  She denies any problems at this time and takes care of activities of daily living.  No chest pain orthopnea or PND.  At the time of my evaluation, the patient is alert awake oriented and in no distress.  She tells me that her right leg is swollen more than the left for the past couple of weeks.  Past Medical History:   Diagnosis Date   Acute bronchitis due to other specified organisms 04/21/2015   Acute hyponatremia 11/19/2020   Acute tension-type headache 12/16/2015   Adjustment disorder with anxious mood 04/21/2015   Allergic asthma with acute exacerbation 09/22/2014   Allergic rhinitis due to pollen 07/23/2014   Allergic urticaria 05/13/2015   Allergy    Alopecia 10/30/2013   Angina pectoris ([place]) 07/29/2019   Asthma    Atrophic vaginitis 10/30/2013   Basal cell carcinoma 04/21/2015   Bilateral impacted cerumen 04/21/2015   CAD (coronary artery disease) 10/28/2019   Cervical radiculopathy 09/17/2015   Change in bowel function 06/14/2016   Chest pain in adult 05/28/2017   Chest tightness 07/29/2019   Chronic diarrhea 12/16/2015   Colon polyps    Cough 02/14/2016   COVID-19 virus infection 11/19/2020   Cystocele, midline 10/30/2013   Dermatitis 09/22/2014   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   Diverticulosis of large intestine 10/30/2013   Dupuytren's contracture of both hands 09/17/2019   Dyspareunia 07/23/2014   Dysuria 12/16/2015   Encounter for long-term (current) use of other medications 10/19/2012   Esophageal dysphagia 06/14/2016   Essential hypertension    Family history of pheochromocytoma 02/14/2016   Foot pain, bilateral 01/05/2018   Gastroesophageal reflux disease    Gastroesophageal reflux disease without esophagitis 05/13/2015   Generalized abdominal pain 12/16/2015   GERD (gastroesophageal reflux disease)    H/O: hysterectomy    Herpes simplex 08/17/2016   History of adenomatous polyp of colon  06/14/2016   History of blood transfusion 1970   Hx of migraines 05/15/2009   Hyperlipemia 12/10/2020   Hypothyroidism 10/30/2013   Hypotonic bladder 10/30/2013   Insomnia 10/11/2017   Localized edema 12/16/2015   Low back pain 09/17/2015   Lumbar paraspinal muscle spasm 04/21/2015   Major depression, recurrent ([place]) 07/14/2013   Major depressive disorder, recurrent, mild  ([place]) 03/14/2017   Malaise and fatigue 04/21/2015   MCI (mild cognitive impairment) 08/18/2015   Memory difficulty 09/17/2015   Menopause 03/31/2014   Midline low back pain without sciatica 03/31/2014   Migraine    Mild intermittent extrinsic asthma 12/16/2015   Mild persistent asthma 05/13/2015   Mixed dyslipidemia 11/12/2019   Nausea vomiting and diarrhea 11/19/2020   Neck pain 09/17/2015   Neuropathy 09/22/2014   Nonintractable headache 11/02/2016   Other allergic rhinitis 05/13/2015   Palpitation 09/03/2017   Pedal edema 07/29/2019   Pelvic pain in female 02/17/2015   Personality disorder ([place]) 10/19/2012   Pharyngoesophageal dysphagia 04/21/2015   Plantar fasciitis 01/20/2016   Precordial chest pain 08/18/2015   Primary insomnia 10/11/2017   Prolapse of vaginal vault after hysterectomy 10/30/2013   Rectocele 10/30/2013   Seasonal allergies 10/15/2019   Senile purpura ([place]) 06/15/2020   Shin splint 04/21/2015   Shortness of breath 07/17/2016   Sinusitis, chronic 10/30/2013   Slow transit constipation 07/23/2014   SOB (shortness of breath) 11/19/2020   Stable angina ([facility]) 10/28/2019   Swelling of lower limb 05/17/2016   Tension-type headache, not intractable 09/17/2015   Thyroid disease    Tinnitus of both ears 10/11/2017   Unstable angina ([place]) 07/29/2019   Urethral stricture 10/30/2013   Urinary frequency 02/23/2021   Urinary incontinence    Urinary incontinence without sensory awareness 07/17/2016   Varicose veins of both lower extremities 04/24/2016   Venous insufficiency (chronic) (peripheral) 06/06/2016    Past Surgical History:  Procedure Laterality Date   BREAST CYST ASPIRATION Left    25 years ago    COLONOSCOPY  04/2016   [facility] Diverticulitis and Multiple colon polyps   ESOPHAGOGASTRODUODENOSCOPY  04/2016   [facility]   INTRAVASCULAR PRESSURE WIRE/FFR STUDY N/A 10/17/2019   Procedure: INTRAVASCULAR PRESSURE WIRE/FFR STUDY;  Surgeon: Nelva Bush, MD;  Location: [facility];  Service: Cardiovascular;  Laterality: N/A;   LEFT HEART CATH AND CORONARY ANGIOGRAPHY N/A 10/17/2019   Procedure: LEFT HEART CATH AND CORONARY ANGIOGRAPHY;  Surgeon: Nelva Bush, MD;  Location: [facility];  Service: Cardiovascular;  Laterality: N/A;   MOHS SURGERY  10/05/2015   RECTOCELE REPAIR  2011   SKIN CANCER EXCISION  02/2015   Squamous cell removed from back   TONSILLECTOMY     VAGINAL HYSTERECTOMY     VAGINAL PROLAPSE REPAIR      Current Medications: Current Meds  Medication Sig   acetaminophen (TYLENOL) 500 MG tablet Take 500 mg by mouth every 6 (six) hours as needed for moderate pain.   albuterol (PROAIR HFA) 108 (90 Base) MCG/ACT inhaler Inhale 2 puffs into the lungs every 4 (four) hours as needed for wheezing or shortness of breath.   albuterol (PROVENTIL) (2.5 MG/3ML) 0.083% nebulizer solution INHALE 3 ML BY NEBULIZATION EVERY 6 HOURS AS NEEDED FOR WHEEZING OR SHORTNESS OF BREATH   ASPIRIN 81 PO Take 81 mg by mouth daily.    azelastine (ASTELIN) 0.1 % nasal spray Place 2 sprays into both nostrils 2 (two) times daily. Use in each nostril as directed  betamethasone valerate (VALISONE) 0.1 % cream Apply 1 application topically daily.   cholecalciferol (VITAMIN D3) 25 MCG (1000 UNIT) tablet Take 1,000 Units by mouth daily.   cholestyramine (QUESTRAN) 4 GM/DOSE powder Take 1 packet (4 g total) by mouth 3 (three) times daily with meals.   Coenzyme Q10 (CO Q-10) 200 MG CAPS Take 200 mg by mouth daily.   fluticasone (FLONASE) 50 MCG/ACT nasal spray Place 2 sprays into both nostrils as needed for rhinitis or allergies.   fluticasone-salmeterol (ADVAIR DISKUS) 250-50 MCG/ACT AEPB TAKE 1 PUFF BY MOUTH TWICE A DAY   gabapentin (NEURONTIN) 300 MG capsule Take 300 mg by mouth in the morning.   ibuprofen (ADVIL) 200 MG tablet Take 400 mg by mouth every 6 (six) hours as needed for mild pain.   ipratropium-albuterol (DUONEB) 0.5-2.5  (3) MG/3ML SOLN Take 3 mLs by nebulization every 4 (four) hours as needed for wheezing or shortness of breath.   isosorbide mononitrate (IMDUR) 30 MG 24 hr tablet TAKE 1/2 TABLET BY MOUTH DAILY   levocetirizine (XYZAL) 5 MG tablet Take 5 mg by mouth daily.   levothyroxine (SYNTHROID) 150 MCG tablet Take 1.5 tablets (225 mcg total) by mouth daily before breakfast.   LORazepam (ATIVAN) 0.5 MG tablet Take 0.5 mg by mouth every 8 (eight) hours as needed for anxiety.    nitrofurantoin, macrocrystal-monohydrate, (MACROBID) 100 MG capsule Take 1 capsule (100 mg total) by mouth 2 (two) times daily.   nitroGLYCERIN (NITROSTAT) 0.4 MG SL tablet Place 0.4 mg under the tongue every 5 (five) minutes as needed for chest pain.   olopatadine (PATANOL) 0.1 % ophthalmic solution Place 1 drop into both eyes 2 (two) times daily.   ondansetron (ZOFRAN-ODT) 4 MG disintegrating tablet Take 1 tablet (4 mg total) by mouth every 8 (eight) hours as needed for nausea or vomiting.   pantoprazole (PROTONIX) 40 MG tablet Take 1 tablet (40 mg total) by mouth 2 (two) times daily.   promethazine-phenylephrine (PROMETHAZINE VC) 6.25-5 MG/5ML SYRP Take 5 mLs by mouth every 6 (six) hours as needed for congestion.   temazepam (RESTORIL) 15 MG capsule Take 15 mg by mouth at bedtime.    topiramate (TOPAMAX) 100 MG tablet Take 100 mg by mouth at bedtime.    valACYclovir (VALTREX) 1000 MG tablet TAKE 1 TABLET BY MOUTH ONCE DAILY FOR 5 DAYS DURING OUTBREAKS.     Allergies:   Iodinated contrast media, Kenalog [triamcinolone acetonide], Metrizamide, Other, Penicillins, Crestor [rosuvastatin], Lipitor [atorvastatin], Montelukast sodium, Pitavastatin, Xyzal [levocetirizine], Ciprofloxacin, Diazepam, Latex, and Sulfa antibiotics   Social History   Socioeconomic History   Marital status: Divorced    Spouse name: Not on file   Number of children: Not on file   Years of education: Not on file   Highest education level: Not on file   Occupational History   Not on file  Tobacco Use   Smoking status: Never   Smokeless tobacco: Never  Vaping Use   Vaping Use: Never used  Substance and Sexual Activity   Alcohol use: Not Currently   Drug use: No   Sexual activity: Never    Partners: Male  Other Topics Concern   Not on file  Social History Narrative   Not on file   Social Determinants of Health   Financial Resource Strain: Low Risk    Difficulty of Paying Living Expenses: Not hard at all  Food Insecurity: No Food Insecurity   Worried About [place] in the Last Year: Never  true   Ran Out of Food in the Last Year: Never true  Transportation Needs: No Transportation Needs   Lack of Transportation (Medical): No   Lack of Transportation (Non-Medical): No  Physical Activity: Inactive   Days of Exercise per Week: 0 days   Minutes of Exercise per Session: 0 min  Stress: No Stress Concern Present   Feeling of Stress : Not at all  Social Connections: Moderately Integrated   Frequency of Communication with Friends and Family: More than three times a week   Frequency of Social Gatherings with Friends and Family: More than three times a week   Attends Religious Services: More than 4 times per year   Active Member of [employer] or Organizations: Yes   Attends Music therapist: More than 4 times per year   Marital Status: Divorced     Family History: The patient's family history includes Alzheimer's disease in her mother; Angina in her father; Asthma in her father; Cancer in her sister; Emphysema in her father. There is no history of Colon cancer or Esophageal cancer.  ROS:   Please see the history of present illness.    All other systems reviewed and are negative.  EKGs/Labs/Other Studies Reviewed:    The following studies were reviewed today: I discussed my findings with the patient at length   Recent Labs: 11/19/2020: TSH 1.448 11/22/2020: B Natriuretic Peptide 27.2; Magnesium  2.0 11/26/2020: Hemoglobin 12.7; Platelets 214.0 05/13/2021: ALT 17; BUN 8; Creatinine, Ser 0.75; Potassium 3.8; Sodium 138  Recent Lipid Panel    Component Value Date/Time   CHOL 140 05/13/2021 0941   CHOL 164 12/13/2020 0953   TRIG 159.0 (H) 05/13/2021 0941   HDL 35.90 (L) 05/13/2021 0941   HDL 39 (L) 12/13/2020 0953   CHOLHDL 4 05/13/2021 0941   VLDL 31.8 05/13/2021 0941   LDLCALC 73 05/13/2021 0941   LDLCALC 88 12/13/2020 0953    Physical Exam:    VS:  BP 116/68    Pulse (!) 50    Ht 5\' 6"  (1.676 m)    Wt 139 lb 1.3 oz (63.1 kg)    SpO2 99%    BMI 22.45 kg/m     Wt Readings from Last 3 Encounters:  07/01/21 139 lb 1.3 oz (63.1 kg)  05/13/21 140 lb 2 oz (63.6 kg)  02/23/21 142 lb (64.4 kg)     GEN: Patient is in no acute distress HEENT: Normal NECK: No JVD; No carotid bruits LYMPHATICS: No lymphadenopathy CARDIAC: Hear sounds regular, 2/6 systolic murmur at the apex. RESPIRATORY:  Clear to auscultation without rales, wheezing or rhonchi  ABDOMEN: Soft, non-tender, non-distended MUSCULOSKELETAL: Pedal edema right greater than left; No deformity  SKIN: Warm and dry NEUROLOGIC:  Alert and oriented x 3 PSYCHIATRIC:  Normal affect   Signed, Jenean Lindau, MD  07/01/2021 1:57 PM    [facility]

## 2021-07-01 NOTE — ED Triage Notes (Signed)
First contact with patient. Patient arrived via triage with complaints of right leg swelling x 1 week. Patient complains of increased shortness of breath since the swelling started. Patient states her right arm started hurting her this AM.

## 2021-07-01 NOTE — Discharge Instructions (Signed)
It is POSSIBLE your rash is early shingles (we have not confirmed this).  You can use over the counter lidocaine cream (eg 4% goldbond's) as needed for itching and burning.  You can also use a Benadryl pill at night to help with itching and sleep.  Please read over the attached instructions regarding shingles.  I prescribed an antiviral pill called Valtrex which she will take 3 times a day for a week.  For your leg swelling, we do not see signs of blood clot in your leg on your scan.  I prescribed you 7 days of a diuretic called Lasix to take in the morning.  This will help you pee extra fluid out of your body.  You should also try to keep your leg elevated whenever possible at home, and consider wearing compression stockings to squeeze necks or fluid out of the leg.

## 2021-07-01 NOTE — ED Provider Notes (Signed)
[facility] EMERGENCY DEPARTMENT Provider Note   CSN: [id number] Arrival date & time: 07/01/21  1409     History  Chief Complaint  Patient presents with   Leg Swelling    Jenna Campbell is a 71 y.o. female presenting to the ED with right leg swelling.  Patient referred for asymmetrical leg swelling and abdominal rash by cardiologist from office appointment today.  She reports this swelling x 1 week, R> L, although it is chronic and intermittent.  Tried compression stockings at home.  Not on diuretics.    Notes itchy small punctate red rash on left lower abdomen beginning yesterday.  Does not recall insect stings or bites.  No hx of shingles outbreaks  Dr Geraldo Pitter office note today , cardiology, notes hx of CAD, HTN, HLD  Last LHC on record June 2021 showing single vessel disease 50-60% ostial/proximal LAD stenosis  HPI     Home Medications Prior to Admission medications   Medication Sig Start Date End Date Taking? Authorizing Provider  furosemide (LASIX) 20 MG tablet Take 1 tablet (20 mg total) by mouth every morning for 7 days. 07/01/21 07/08/21 Yes Ashtin Melichar, Carola Rhine, MD  valACYclovir (VALTREX) 1000 MG tablet Take 1 tablet (1,000 mg total) by mouth 3 (three) times daily for 21 doses. 07/01/21 07/08/21 Yes Estalee Mccandlish, Carola Rhine, MD  acetaminophen (TYLENOL) 500 MG tablet Take 500 mg by mouth every 6 (six) hours as needed for moderate pain.    [provider]  albuterol (PROAIR HFA) 108 (90 Base) MCG/ACT inhaler Inhale 2 puffs into the lungs every 4 (four) hours as needed for wheezing or shortness of breath. 09/08/20   Nani Ravens, Crosby Oyster, DO  albuterol (PROVENTIL) (2.5 MG/3ML) 0.083% nebulizer solution INHALE 3 ML BY NEBULIZATION EVERY 6 HOURS AS NEEDED FOR WHEEZING OR SHORTNESS OF BREATH 11/10/20   Shelda Pal, DO  ASPIRIN 81 PO Take 81 mg by mouth daily.     [provider]  azelastine (ASTELIN) 0.1 % nasal spray Place 2 sprays into both nostrils  2 (two) times daily. Use in each nostril as directed 11/10/20   Shelda Pal, DO  betamethasone valerate (VALISONE) 0.1 % cream Apply 1 application topically daily.    [provider]  cholecalciferol (VITAMIN D3) 25 MCG (1000 UNIT) tablet Take 1,000 Units by mouth daily.    [provider]  cholestyramine Lucrezia Starch) 4 GM/DOSE powder Take 1 packet (4 g total) by mouth 3 (three) times daily with meals. 12/22/20   Revankar, Reita Cliche, MD  Coenzyme Q10 (CO Q-10) 200 MG CAPS Take 200 mg by mouth daily. 10/13/20   Revankar, Reita Cliche, MD  fluticasone (FLONASE) 50 MCG/ACT nasal spray Place 2 sprays into both nostrils as needed for rhinitis or allergies.    [provider]  fluticasone-salmeterol (ADVAIR DISKUS) 250-50 MCG/ACT AEPB TAKE 1 PUFF BY MOUTH TWICE A DAY 11/10/20   Wendling, Crosby Oyster, DO  gabapentin (NEURONTIN) 300 MG capsule Take 300 mg by mouth in the morning.    [provider]  ibuprofen (ADVIL) 200 MG tablet Take 400 mg by mouth every 6 (six) hours as needed for mild pain.    [provider]  ipratropium-albuterol (DUONEB) 0.5-2.5 (3) MG/3ML SOLN Take 3 mLs by nebulization every 4 (four) hours as needed for wheezing or shortness of breath.    [provider]  isosorbide mononitrate (IMDUR) 30 MG 24 hr tablet TAKE 1/2 TABLET BY MOUTH DAILY 05/23/21   Revankar, Reita Cliche, MD  levocetirizine (XYZAL) 5 MG tablet Take 5 mg by mouth daily. 09/20/20   [provider]  levothyroxine (SYNTHROID) 150 MCG tablet Take 1.5 tablets (225 mcg total) by mouth daily before breakfast. 11/10/20   Wendling, Crosby Oyster, DO  LORazepam (ATIVAN) 0.5 MG tablet Take 0.5 mg by mouth every 8 (eight) hours as needed for anxiety.     [provider]  nitrofurantoin, macrocrystal-monohydrate, (MACROBID) 100 MG capsule Take 1 capsule (100 mg total) by mouth 2 (two) times daily. 02/23/21   Debbrah Alar, NP  nitroGLYCERIN (NITROSTAT) 0.4 MG SL  tablet Place 0.4 mg under the tongue every 5 (five) minutes as needed for chest pain.    [provider]  olopatadine (PATANOL) 0.1 % ophthalmic solution Place 1 drop into both eyes 2 (two) times daily. 09/01/19   Saguier, Percell Miller, PA-C  ondansetron (ZOFRAN-ODT) 4 MG disintegrating tablet Take 1 tablet (4 mg total) by mouth every 8 (eight) hours as needed for nausea or vomiting. 11/26/20   Nani Ravens, Crosby Oyster, DO  pantoprazole (PROTONIX) 40 MG tablet Take 1 tablet (40 mg total) by mouth 2 (two) times daily. 07/01/20   Cirigliano, Vito V, DO  promethazine-phenylephrine (PROMETHAZINE VC) 6.25-5 MG/5ML SYRP Take 5 mLs by mouth every 6 (six) hours as needed for congestion. 12/02/20   Shelda Pal, DO  temazepam (RESTORIL) 15 MG capsule Take 15 mg by mouth at bedtime.  07/28/19   [provider]  topiramate (TOPAMAX) 100 MG tablet Take 100 mg by mouth at bedtime.     [provider]  valACYclovir (VALTREX) 1000 MG tablet TAKE 1 TABLET BY MOUTH ONCE DAILY FOR 5 DAYS DURING OUTBREAKS. 05/05/21   Shelda Pal, DO      Allergies    Iodinated contrast media, Kenalog [triamcinolone acetonide], Metrizamide, Other, Penicillins, Crestor [rosuvastatin], Lipitor [atorvastatin], Montelukast sodium, Pitavastatin, Xyzal [levocetirizine], Ciprofloxacin, Diazepam, Latex, and Sulfa antibiotics    Review of Systems   Review of Systems  Physical Exam Updated Vital Signs BP (!) 163/67    Pulse (!) 51    Temp 97.8 F (36.6 C) (Oral)    Resp 13    Ht 5\' 6"  (1.676 m)    Wt 63 kg    SpO2 100%    BMI 22.44 kg/m  Physical Exam Constitutional:      General: She is not in acute distress. HENT:     Head: Normocephalic and atraumatic.  Eyes:     Conjunctiva/sclera: Conjunctivae normal.     Pupils: Pupils are equal, round, and reactive to light.  Cardiovascular:     Rate and Rhythm: Normal rate and regular rhythm.  Pulmonary:     Effort: Pulmonary effort is normal. No  respiratory distress.  Abdominal:     General: There is no distension.     Tenderness: There is no abdominal tenderness.  Musculoskeletal:     Comments: Bilateral pitting edema, R>L  Skin:    General: Skin is warm and dry.     Comments: Small macular-papular rash in left dermatomal distribution mid-lower abdomen (approx T10); 5 individual spots in linear pattern measuring about 1 cm each  Neurological:     General: No focal deficit present.     Mental Status: She is alert. Mental status is at baseline.  Psychiatric:        Mood and Affect: Mood normal.        Behavior: Behavior normal.    ED Results / Procedures / Treatments   Labs (all labs  ordered are listed, but only abnormal results are displayed) Labs Reviewed  BASIC METABOLIC PANEL - Abnormal; Notable for the following components:      Result Value   Sodium 132 (*)    Glucose, Bld 101 (*)    Calcium 8.6 (*)    All other components within normal limits  CBC  TROPONIN I (HIGH SENSITIVITY)    EKG EKG Interpretation  Date/Time:  Friday July 01 2021 14:37:00 EST Ventricular Rate:  44 PR Interval:  150 QRS Duration: 98 QT Interval:  528 QTC Calculation: 451 R Axis:   79 Text Interpretation: Marked sinus bradycardia Abnormal ECG When compared with ECG of 20-Nov-2020 01:00, PREVIOUS ECG IS PRESENT Confirmed by Nanda Quinton [phone]) on 07/01/2021 2:40:04 PM  Radiology DG Chest 2 View  Result Date: 07/01/2021 CLINICAL DATA:  arm pain leg swelling increased SOB EXAM: CHEST - 2 VIEW COMPARISON:  November 26, 2020. FINDINGS: No consolidation. No visible pleural effusions or pneumothorax. Cardiomediastinal silhouette is within normal limits. Degenerative changes of the spine. IMPRESSION: No evidence of acute cardiopulmonary disease. Electronically Signed   By: Margaretha Sheffield M.D.   On: 07/01/2021 14:46   US Venous Img Lower Unilateral Right  Result Date: 07/01/2021 CLINICAL DATA:  Asymmetric leg swelling EXAM: RIGHT LOWER  EXTREMITY VENOUS DOPPLER ULTRASOUND TECHNIQUE: Gray-scale sonography with compression, as well as color and duplex ultrasound, were performed to evaluate the deep venous system(s) from the level of the common femoral vein through the popliteal and proximal calf veins. COMPARISON:  None. FINDINGS: VENOUS Normal compressibility of the common femoral, superficial femoral, and popliteal veins, as well as the visualized calf veins. Visualized portions of profunda femoral vein and great saphenous vein unremarkable. No filling defects to suggest DVT on grayscale or color Doppler imaging. Doppler waveforms show normal direction of venous flow, normal respiratory plasticity and response to augmentation. Limited views of the contralateral common femoral vein are unremarkable. OTHER None. Limitations: none IMPRESSION: No evidence of DVT in the right lower extremity. Electronically Signed   By: Maurine Simmering M.D.   On: 07/01/2021 16:18    Procedures Procedures    Medications Ordered in ED Medications - No data to display  ED Course/ Medical Decision Making/ A&P                           Medical Decision Making Amount and/or Complexity of Data Reviewed ECG/medicine tests: ordered.  Risk Prescription drug management.   This patient presents to the ED with concern for rash, right leg swelling. This involves an extensive number of treatment options, and is a complaint that carries with it a high risk of complications and morbidity.  The differential diagnosis includes shingles rash vs venous stasis/lymphadema vs DVT vs other  External records from outside source obtained and reviewed including [place] 2021 report, office cardiology note from today  I ordered and personally interpreted labs.  The pertinent results include:  unremarkable BMP, CBC, Trop which were unremarkable.  I ordered imaging studies including dg chest, DVT ultrasound right LE I independently visualized and interpreted imaging which showed no  focal intrathoracic process, ultrasound showing no acute DVT I agree with the radiologist interpretation  The patient was maintained on a cardiac monitor.  I personally viewed and interpreted the cardiac monitored which showed an underlying rhythm of: sinus bradycardia  Per my interpretation the patient's ECG shows sinus bradycardia HR 44, Qtc 451, no acute ischemic findings   Test Considered:  -  Doubt ACS, acute PE, PNA at this time   Dispostion:  After consideration of the diagnostic results and the patients response to treatment, I feel that the patent would benefit from 5 day course of low-dose diuretic for LE edema; elevation and compression stockings; 7 day course of valtrex for possible early shingles outbreak (discussed with patient isolation measures at home).  Okay for discharge.         Final Clinical Impression(s) / ED Diagnoses Final diagnoses:  Leg edema  Rash    Rx / DC Orders ED Discharge Orders          Ordered    furosemide (LASIX) 20 MG tablet  Every morning        07/01/21 1631    valACYclovir (VALTREX) 1000 MG tablet  3 times daily        07/01/21 1631              Wyvonnia Dusky, MD 07/01/21 [phone]

## 2021-07-07 ENCOUNTER — Encounter: Payer: Self-pay | Admitting: Family Medicine

## 2021-07-07 ENCOUNTER — Ambulatory Visit (INDEPENDENT_AMBULATORY_CARE_PROVIDER_SITE_OTHER): Payer: PPO | Admitting: Family Medicine

## 2021-07-07 VITALS — BP 118/62 | HR 64 | Temp 98.1°F | Ht 66.0 in | Wt 138.0 lb

## 2021-07-07 DIAGNOSIS — F330 Major depressive disorder, recurrent, mild: Secondary | ICD-10-CM | POA: Diagnosis not present

## 2021-07-07 DIAGNOSIS — M7989 Other specified soft tissue disorders: Secondary | ICD-10-CM | POA: Diagnosis not present

## 2021-07-07 DIAGNOSIS — D692 Other nonthrombocytopenic purpura: Secondary | ICD-10-CM | POA: Diagnosis not present

## 2021-07-07 DIAGNOSIS — I2089 Other forms of angina pectoris: Secondary | ICD-10-CM

## 2021-07-07 DIAGNOSIS — I208 Other forms of angina pectoris: Secondary | ICD-10-CM | POA: Diagnosis not present

## 2021-07-07 DIAGNOSIS — D709 Neutropenia, unspecified: Secondary | ICD-10-CM

## 2021-07-07 HISTORY — DX: Neutropenia, unspecified: D70.9

## 2021-07-07 MED ORDER — FUROSEMIDE 40 MG PO TABS: 40.0000 mg | ORAL_TABLET | Freq: Every day | ORAL | 3 refills | Status: DC

## 2021-07-07 NOTE — Patient Instructions (Addendum)
For the swelling in your lower extremities, be sure to elevate your legs when able, mind the salt intake, stay physically active and consider wearing compression stockings.  OK to stop the Lasix. If the swelling returns, go back on the lasix that I sent as a refill.   Keep the diet clean and stay active.  Give [place] 2-3 business days to get the results of your labs back. We may send in a potassium supplement depending on the results.   Heat (pad or rice pillow in microwave) over affected area, 10-15 minutes twice daily.   Ice/cold pack over area for 10-15 min twice daily.  OK to take Tylenol 1000 mg (2 extra strength tabs) or 975 mg (3 regular strength tabs) every 6 hours as needed.  Let us know if you need anything.  EXERCISES  RANGE OF MOTION (ROM) AND STRETCHING EXERCISES - Low Back Pain Most people with lower back pain will find that their symptoms get worse with excessive bending forward (flexion) or arching at the lower back (extension). The exercises that will help resolve your symptoms will focus on the opposite motion.  If you have pain, numbness or tingling which travels down into your buttocks, leg or foot, the goal of the therapy is for these symptoms to move closer to your back and eventually resolve. Sometimes, these leg symptoms will get better, but your lower back pain may worsen. This is often an indication of progress in your rehabilitation. Be very alert to any changes in your symptoms and the activities in which you participated in the 24 hours prior to the change. Sharing this information with your caregiver will allow him or her to most efficiently treat your condition. These exercises may help you when beginning to rehabilitate your injury. Your symptoms may resolve with or without further involvement from your physician, physical therapist or athletic trainer. While completing these exercises, remember:  Restoring tissue flexibility helps normal motion to return to the  joints. This allows healthier, less painful movement and activity. An effective stretch should be held for at least 30 seconds. A stretch should never be painful. You should only feel a gentle lengthening or release in the stretched tissue. FLEXION RANGE OF MOTION AND STRETCHING EXERCISES:  STRETCH - Flexion, Single Knee to Chest  Lie on a firm bed or floor with both legs extended in front of you. Keeping one leg in contact with the floor, bring your opposite knee to your chest. Hold your leg in place by either grabbing behind your thigh or at your knee. Pull until you feel a gentle stretch in your low back. Hold 30 seconds. Slowly release your grasp and repeat the exercise with the opposite side. Repeat 2 times. Complete this exercise 3 times per week.   STRETCH - Flexion, Double Knee to Chest Lie on a firm bed or floor with both legs extended in front of you. Keeping one leg in contact with the floor, bring your opposite knee to your chest. Tense your stomach muscles to support your back and then lift your other knee to your chest. Hold your legs in place by either grabbing behind your thighs or at your knees. Pull both knees toward your chest until you feel a gentle stretch in your low back. Hold 30 seconds. Tense your stomach muscles and slowly return one leg at a time to the floor. Repeat 2 times. Complete this exercise 3 times per week.   STRETCH - Low Trunk Rotation Lie on a firm  bed or floor. Keeping your legs in front of you, bend your knees so they are both pointed toward the ceiling and your feet are flat on the floor. Extend your arms out to the side. This will stabilize your upper body by keeping your shoulders in contact with the floor. Gently and slowly drop both knees together to one side until you feel a gentle stretch in your low back. Hold for 30 seconds. Tense your stomach muscles to support your lower back as you bring your knees back to the starting position. Repeat the  exercise to the other side. Repeat 2 times. Complete this exercise at least 3 times per week.   EXTENSION RANGE OF MOTION AND FLEXIBILITY EXERCISES:  STRETCH - Extension, Prone on Elbows  Lie on your stomach on the floor, a bed will be too soft. Place your palms about shoulder width apart and at the height of your head. Place your elbows under your shoulders. If this is too painful, stack pillows under your chest. Allow your body to relax so that your hips drop lower and make contact more completely with the floor. Hold this position for 30 seconds. Slowly return to lying flat on the floor. Repeat 2 times. Complete this exercise 3 times per week.   RANGE OF MOTION - Extension, Prone Press Ups Lie on your stomach on the floor, a bed will be too soft. Place your palms about shoulder width apart and at the height of your head. Keeping your back as relaxed as possible, slowly straighten your elbows while keeping your hips on the floor. You may adjust the placement of your hands to maximize your comfort. As you gain motion, your hands will come more underneath your shoulders. Hold this position 30 seconds. Slowly return to lying flat on the floor. Repeat 2 times. Complete this exercise 3 times per week.   RANGE OF MOTION- Quadruped, Neutral Spine  Assume a hands and knees position on a firm surface. Keep your hands under your shoulders and your knees under your hips. You may place padding under your knees for comfort. Drop your head and point your tailbone toward the ground below you. This will round out your lower back like an angry cat. Hold this position for 30 seconds. Slowly lift your head and release your tail bone so that your back sags into a large arch, like an old horse. Hold this position for 30 seconds. Repeat this until you feel limber in your low back. Now, find your "sweet spot." This will be the most comfortable position somewhere between the two previous positions. This is your  neutral spine. Once you have found this position, tense your stomach muscles to support your low back. Hold this position for 30 seconds. Repeat 2 times. Complete this exercise 3 times per week.   STRENGTHENING EXERCISES - Low Back Sprain These exercises may help you when beginning to rehabilitate your injury. These exercises should be done near your "sweet spot." This is the neutral, low-back arch, somewhere between fully rounded and fully arched, that is your least painful position. When performed in this safe range of motion, these exercises can be used for people who have either a flexion or extension based injury. These exercises may resolve your symptoms with or without further involvement from your physician, physical therapist or athletic trainer. While completing these exercises, remember:  Muscles can gain both the endurance and the strength needed for everyday activities through controlled exercises. Complete these exercises as instructed by your  physician, physical therapist or athletic trainer. Increase the resistance and repetitions only as guided. You may experience muscle soreness or fatigue, but the pain or discomfort you are trying to eliminate should never worsen during these exercises. If this pain does worsen, stop and make certain you are following the directions exactly. If the pain is still present after adjustments, discontinue the exercise until you can discuss the trouble with your caregiver.  STRENGTHENING - Deep Abdominals, Pelvic Tilt  Lie on a firm bed or floor. Keeping your legs in front of you, bend your knees so they are both pointed toward the ceiling and your feet are flat on the floor. Tense your lower abdominal muscles to press your low back into the floor. This motion will rotate your pelvis so that your tail bone is scooping upwards rather than pointing at your feet or into the floor. With a gentle tension and even breathing, hold this position for 3  seconds. Repeat 2 times. Complete this exercise 3 times per week.   STRENGTHENING - Abdominals, Crunches  Lie on a firm bed or floor. Keeping your legs in front of you, bend your knees so they are both pointed toward the ceiling and your feet are flat on the floor. Cross your arms over your chest. Slightly tip your chin down without bending your neck. Tense your abdominals and slowly lift your trunk high enough to just clear your shoulder blades. Lifting higher can put excessive stress on the lower back and does not further strengthen your abdominal muscles. Control your return to the starting position. Repeat 2 times. Complete this exercise 3 times per week.   STRENGTHENING - Quadruped, Opposite UE/LE Lift  Assume a hands and knees position on a firm surface. Keep your hands under your shoulders and your knees under your hips. You may place padding under your knees for comfort. Find your neutral spine and gently tense your abdominal muscles so that you can maintain this position. Your shoulders and hips should form a rectangle that is parallel with the floor and is not twisted. Keeping your trunk steady, lift your right hand no higher than your shoulder and then your left leg no higher than your hip. Make sure you are not holding your breath. Hold this position for 30 seconds. Continuing to keep your abdominal muscles tense and your back steady, slowly return to your starting position. Repeat with the opposite arm and leg. Repeat 2 times. Complete this exercise 3 times per week.   STRENGTHENING - Abdominals and Quadriceps, Straight Leg Raise  Lie on a firm bed or floor with both legs extended in front of you. Keeping one leg in contact with the floor, bend the other knee so that your foot can rest flat on the floor. Find your neutral spine, and tense your abdominal muscles to maintain your spinal position throughout the exercise. Slowly lift your straight leg off the floor about 6 inches for a  count of 3, making sure to not hold your breath. Still keeping your neutral spine, slowly lower your leg all the way to the floor. Repeat this exercise with each leg 2 times. Complete this exercise 3 times per week.  POSTURE AND BODY MECHANICS CONSIDERATIONS - Low Back Sprain Keeping correct posture when sitting, standing or completing your activities will reduce the stress put on different body tissues, allowing injured tissues a chance to heal and limiting painful experiences. The following are general guidelines for improved posture.  While reading these guidelines, remember: The  exercises prescribed by your provider will help you have the flexibility and strength to maintain correct postures. The correct posture provides the best environment for your joints to work. All of your joints have less wear and tear when properly supported by a spine with good posture. This means you will experience a healthier, less painful body. Correct posture must be practiced with all of your activities, especially prolonged sitting and standing. Correct posture is as important when doing repetitive low-stress activities (typing) as it is when doing a single heavy-load activity (lifting).  RESTING POSITIONS Consider which positions are most painful for you when choosing a resting position. If you have pain with flexion-based activities (sitting, bending, stooping, squatting), choose a position that allows you to rest in a less flexed posture. You would want to avoid curling into a fetal position on your side. If your pain worsens with extension-based activities (prolonged standing, working overhead), avoid resting in an extended position such as sleeping on your stomach. Most people will find more comfort when they rest with their spine in a more neutral position, neither too rounded nor too arched. Lying on a non-sagging bed on your side with a pillow between your knees, or on your back with a pillow under your knees  will often provide some relief. Keep in mind, being in any one position for a prolonged period of time, no matter how correct your posture, can still lead to stiffness.  PROPER SITTING POSTURE In order to minimize stress and discomfort on your spine, you must sit with correct posture. Sitting with good posture should be effortless for a healthy body. Returning to good posture is a gradual process. Many people can work toward this most comfortably by using various supports until they have the flexibility and strength to maintain this posture on their own. When sitting with proper posture, your ears will fall over your shoulders and your shoulders will fall over your hips. You should use the back of the chair to support your upper back. Your lower back will be in a neutral position, just slightly arched. You may place a small pillow or folded towel at the base of your lower back for  support.  When working at a desk, create an environment that supports good, upright posture. Without extra support, muscles tire, which leads to excessive strain on joints and other tissues. Keep these recommendations in mind:  CHAIR: A chair should be able to slide under your desk when your back makes contact with the back of the chair. This allows you to work closely. The chair's height should allow your eyes to be level with the upper part of your monitor and your hands to be slightly lower than your elbows.  BODY POSITION Your feet should make contact with the floor. If this is not possible, use a foot rest. Keep your ears over your shoulders. This will reduce stress on your neck and low back.  INCORRECT SITTING POSTURES  If you are feeling tired and unable to assume a healthy sitting posture, do not slouch or slump. This puts excessive strain on your back tissues, causing more damage and pain. Healthier options include: Using more support, like a lumbar pillow. Switching tasks to something that requires you to be  upright or walking. Talking a brief walk. Lying down to rest in a neutral-spine position.  PROLONGED STANDING WHILE SLIGHTLY LEANING FORWARD  When completing a task that requires you to lean forward while standing in one place for a long time,  place either foot up on a stationary 2-4 inch high object to help maintain the best posture. When both feet are on the ground, the lower back tends to lose its slight inward curve. If this curve flattens (or becomes too large), then the back and your other joints will experience too much stress, tire more quickly, and can cause pain.  CORRECT STANDING POSTURES Proper standing posture should be assumed with all daily activities, even if they only take a few moments, like when brushing your teeth. As in sitting, your ears should fall over your shoulders and your shoulders should fall over your hips. You should keep a slight tension in your abdominal muscles to brace your spine. Your tailbone should point down to the ground, not behind your body, resulting in an over-extended swayback posture.   INCORRECT STANDING POSTURES  Common incorrect standing postures include a forward head, locked knees and/or an excessive swayback. WALKING Walk with an upright posture. Your ears, shoulders and hips should all line-up.  PROLONGED ACTIVITY IN A FLEXED POSITION When completing a task that requires you to bend forward at your waist or lean over a low surface, try to find a way to stabilize 3 out of 4 of your limbs. You can place a hand or elbow on your thigh or rest a knee on the surface you are reaching across. This will provide you more stability, so that your muscles do not tire as quickly. By keeping your knees relaxed, or slightly bent, you will also reduce stress across your lower back. CORRECT LIFTING TECHNIQUES  DO : Assume a wide stance. This will provide you more stability and the opportunity to get as close as possible to the object which you are  lifting. Tense your abdominals to brace your spine. Bend at the knees and hips. Keeping your back locked in a neutral-spine position, lift using your leg muscles. Lift with your legs, keeping your back straight. Test the weight of unknown objects before attempting to lift them. Try to keep your elbows locked down at your sides in order get the best strength from your shoulders when carrying an object.   Always ask for help when lifting heavy or awkward objects. INCORRECT LIFTING TECHNIQUES DO NOT:  Lock your knees when lifting, even if it is a small object. Bend and twist. Pivot at your feet or move your feet when needing to change directions. Assume that you can safely pick up even a paperclip without proper posture.

## 2021-07-07 NOTE — Progress Notes (Signed)
Chief Complaint  Patient presents with   ER followup    Subjective: Patient is a 71 y.o. female here for ER f/u.  Dx'd w LE edema and shingles on 2/17. Rx'd Valtrex and Lasix with improvement. Still having some swelling around her ankle. Tried to use compression stockings in the past but it hurt too much to continue.   Rash on L side of abdomen improved while using hydrogen peroxide and Valtrex. Finishing tomorrow.   Past Medical History:  Diagnosis Date   Acute bronchitis due to other specified organisms 04/21/2015   Acute hyponatremia 11/19/2020   Acute tension-type headache 12/16/2015   Adjustment disorder with anxious mood 04/21/2015   Allergic asthma with acute exacerbation 09/22/2014   Allergic rhinitis due to pollen 07/23/2014   Allergic urticaria 05/13/2015   Allergy    Alopecia 10/30/2013   Angina pectoris ([place]) 07/29/2019   Asthma    Atrophic vaginitis 10/30/2013   Basal cell carcinoma 04/21/2015   Bilateral impacted cerumen 04/21/2015   CAD (coronary artery disease) 10/28/2019   Cervical radiculopathy 09/17/2015   Change in bowel function 06/14/2016   Chest pain in adult 05/28/2017   Chest tightness 07/29/2019   Chronic diarrhea 12/16/2015   Colon polyps    Cough 02/14/2016   COVID-19 virus infection 11/19/2020   Cystocele, midline 10/30/2013   Dermatitis 09/22/2014   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   Diverticulosis of large intestine 10/30/2013   Dupuytren's contracture of both hands 09/17/2019   Dyspareunia 07/23/2014   Dysuria 12/16/2015   Encounter for long-term (current) use of other medications 10/19/2012   Esophageal dysphagia 06/14/2016   Essential hypertension    Family history of pheochromocytoma 02/14/2016   Foot pain, bilateral 01/05/2018   Gastroesophageal reflux disease    Gastroesophageal reflux disease without esophagitis 05/13/2015   Generalized abdominal pain 12/16/2015   GERD (gastroesophageal reflux disease)    H/O: hysterectomy     Herpes simplex 08/17/2016   History of adenomatous polyp of colon 06/14/2016   History of blood transfusion 1970   Hx of migraines 05/15/2009   Hyperlipemia 12/10/2020   Hypothyroidism 10/30/2013   Hypotonic bladder 10/30/2013   Insomnia 10/11/2017   Localized edema 12/16/2015   Low back pain 09/17/2015   Lumbar paraspinal muscle spasm 04/21/2015   Major depression, recurrent ([place]) 07/14/2013   Major depressive disorder, recurrent, mild ([place]) 03/14/2017   Malaise and fatigue 04/21/2015   MCI (mild cognitive impairment) 08/18/2015   Memory difficulty 09/17/2015   Menopause 03/31/2014   Midline low back pain without sciatica 03/31/2014   Migraine    Mild intermittent extrinsic asthma 12/16/2015   Mild persistent asthma 05/13/2015   Mixed dyslipidemia 11/12/2019   Nausea vomiting and diarrhea 11/19/2020   Neck pain 09/17/2015   Neuropathy 09/22/2014   Nonintractable headache 11/02/2016   Other allergic rhinitis 05/13/2015   Palpitation 09/03/2017   Pedal edema 07/29/2019   Pelvic pain in female 02/17/2015   Personality disorder ([place]) 10/19/2012   Pharyngoesophageal dysphagia 04/21/2015   Plantar fasciitis 01/20/2016   Precordial chest pain 08/18/2015   Primary insomnia 10/11/2017   Prolapse of vaginal vault after hysterectomy 10/30/2013   Rectocele 10/30/2013   Seasonal allergies 10/15/2019   Senile purpura ([place]) 06/15/2020   Shin splint 04/21/2015   Shortness of breath 07/17/2016   Sinusitis, chronic 10/30/2013   Slow transit constipation 07/23/2014   SOB (shortness of breath) 11/19/2020   Stable angina (HCC) 10/28/2019   Swelling of lower limb 05/17/2016   Tension-type headache, not intractable 09/17/2015  Thyroid disease    Tinnitus of both ears 10/11/2017   Unstable angina ([place]) 07/29/2019   Urethral stricture 10/30/2013   Urinary frequency 02/23/2021   Urinary incontinence    Urinary incontinence without sensory awareness 07/17/2016   Varicose veins of both  lower extremities 04/24/2016   Venous insufficiency (chronic) (peripheral) 06/06/2016    Objective: BP 118/62    Pulse 64    Temp 98.1 F (36.7 C) (Oral)    Ht 5\' 6"  (1.676 m)    Wt 138 lb (62.6 kg)    SpO2 98%    BMI 22.27 kg/m  General: Awake, appears stated age Heart: RRR, 2+ pitting LE edema around R ankle Lungs: CTAB, no rales, wheezes or rhonchi. No accessory muscle use Psych: Age appropriate judgment and insight, normal affect and mood  Assessment and Plan: Localized swelling of both lower extremities - Plan: Basic metabolic panel, furosemide (LASIX) 40 MG tablet  Neutropenia, unspecified type ([place])  Major depressive disorder, recurrent, mild (HCC), Chronic  Senile purpura ([place]), Chronic  Stable angina ([place]), Chronic  New problem, uncertain prog. Will ck BMP to ensure she does not require K replacement.  She has 1 more dose of Lasix from the ER that she will take tomorrow.  After that she will stop.  If the lower extremity swelling persist despite routine physical activity, elevation, and minding her salt intake, she will restart the Lasix.  She reportedly does not do well with compression stockings. The patient voiced understanding and agreement to the plan.  [place], DO 07/07/21  2:20 PM

## 2021-07-08 ENCOUNTER — Other Ambulatory Visit: Payer: Self-pay | Admitting: Family Medicine

## 2021-07-08 DIAGNOSIS — R339 Retention of urine, unspecified: Secondary | ICD-10-CM | POA: Diagnosis not present

## 2021-07-08 DIAGNOSIS — R300 Dysuria: Secondary | ICD-10-CM | POA: Diagnosis not present

## 2021-07-08 DIAGNOSIS — N3010 Interstitial cystitis (chronic) without hematuria: Secondary | ICD-10-CM | POA: Diagnosis not present

## 2021-07-08 LAB — BASIC METABOLIC PANEL
BUN: 16 mg/dL (ref 6–23)
CO2: 28 mEq/L (ref 19–32)
Calcium: 8.8 mg/dL (ref 8.4–10.5)
Chloride: 100 mEq/L (ref 96–112)
Creatinine, Ser: 0.76 mg/dL (ref 0.40–1.20)
GFR: 78.84 mL/min (ref >60.00)
Glucose, Bld: 92 mg/dL (ref 70–99)
Potassium: 3.7 mEq/L (ref 3.5–5.1)
Sodium: 134 mEq/L — ABNORMAL LOW (ref 135–145)

## 2021-07-08 MED ORDER — POTASSIUM CHLORIDE ER 10 MEQ PO TBCR
EXTENDED_RELEASE_TABLET | ORAL | 2 refills | Status: DC
Start: 2021-07-08 — End: 2021-09-26

## 2021-07-13 DIAGNOSIS — F329 Major depressive disorder, single episode, unspecified: Secondary | ICD-10-CM | POA: Diagnosis not present

## 2021-07-19 ENCOUNTER — Encounter: Payer: Self-pay | Admitting: Cardiology

## 2021-07-20 ENCOUNTER — Other Ambulatory Visit: Payer: Self-pay

## 2021-07-20 DIAGNOSIS — R001 Bradycardia, unspecified: Secondary | ICD-10-CM

## 2021-07-21 ENCOUNTER — Other Ambulatory Visit: Payer: Self-pay | Admitting: Cardiology

## 2021-07-21 ENCOUNTER — Other Ambulatory Visit: Payer: Self-pay

## 2021-07-21 ENCOUNTER — Ambulatory Visit (INDEPENDENT_AMBULATORY_CARE_PROVIDER_SITE_OTHER): Payer: PPO

## 2021-07-21 DIAGNOSIS — F330 Major depressive disorder, recurrent, mild: Secondary | ICD-10-CM | POA: Diagnosis not present

## 2021-07-21 DIAGNOSIS — F5101 Primary insomnia: Secondary | ICD-10-CM | POA: Diagnosis not present

## 2021-07-21 DIAGNOSIS — R001 Bradycardia, unspecified: Secondary | ICD-10-CM

## 2021-07-21 DIAGNOSIS — F411 Generalized anxiety disorder: Secondary | ICD-10-CM | POA: Diagnosis not present

## 2021-08-11 DIAGNOSIS — R001 Bradycardia, unspecified: Secondary | ICD-10-CM | POA: Diagnosis not present

## 2021-08-12 ENCOUNTER — Encounter: Payer: Self-pay | Admitting: Cardiology

## 2021-08-13 ENCOUNTER — Other Ambulatory Visit: Payer: Self-pay | Admitting: Family Medicine

## 2021-08-13 DIAGNOSIS — E079 Disorder of thyroid, unspecified: Secondary | ICD-10-CM

## 2021-08-17 ENCOUNTER — Telehealth: Payer: Self-pay | Admitting: Cardiology

## 2021-08-17 NOTE — Telephone Encounter (Signed)
Patient called and informed her that Dr. Geraldo Pitter has not reviewed the monitor report and as soon as he does we will give her a call back with the results. ?

## 2021-08-17 NOTE — Telephone Encounter (Signed)
Pt reaching out for monitor results... please advise ?

## 2021-08-19 DIAGNOSIS — B349 Viral infection, unspecified: Secondary | ICD-10-CM | POA: Diagnosis not present

## 2021-08-19 DIAGNOSIS — J029 Acute pharyngitis, unspecified: Secondary | ICD-10-CM | POA: Diagnosis not present

## 2021-08-19 NOTE — Telephone Encounter (Signed)
? ?  Pt is calling back to follow up, she hope she gets the result today ?

## 2021-08-19 NOTE — Telephone Encounter (Signed)
Advised that results have not been reviewed. Pt aware that we will notify her once results are completed. ?

## 2021-08-22 ENCOUNTER — Telehealth: Payer: Self-pay

## 2021-08-22 NOTE — Telephone Encounter (Signed)
Patient Name:Jenna Campbell ?Gender: Female ?DOB: [birth year] ?Age: 71 Y 69 M 13 D ?Return Phone Number: [phone] ?[facility] ?Client Site [facility] ?Provider Riki Sheer- MD ?Contact Type Call ?Who Is Calling Patient / Member / Family / Caregiver ?Call Type Triage / Clinical ?Relationship To Patient Self ?Return Phone Number [phone] (Primary) ?Chief Complaint BREATHING - shortness of breath or sounds ?breathless ?Reason for Call Symptomatic / Request for Health Information ?Initial Comment Caller states she has nasal congestion, earache, ?sore throat, and shortness of breath. ?Translation No ?Nurse Assessment ?Nurse: Sheppard Plumber, RN, Estill Bamberg Date/Time (Eastern Time): 08/19/2021 11:24:36 AM ?Confirm and document reason for call. If ?symptomatic, describe symptoms. ?---caller states she has asthma, has mucus, sneezing, ?glands in throat feel swollen, no fever, covid neg. ?possible flu exposure. no body aches, no headaches ?woke up with this ?

## 2021-08-22 NOTE — Telephone Encounter (Signed)
Called and scheduled my chart video visit for 08/23/21. ?

## 2021-08-23 ENCOUNTER — Telehealth (INDEPENDENT_AMBULATORY_CARE_PROVIDER_SITE_OTHER): Payer: PPO | Admitting: Family Medicine

## 2021-08-23 ENCOUNTER — Encounter: Payer: Self-pay | Admitting: Family Medicine

## 2021-08-23 DIAGNOSIS — J0100 Acute maxillary sinusitis, unspecified: Secondary | ICD-10-CM

## 2021-08-23 MED ORDER — PREDNISONE 20 MG PO TABS: 40.0000 mg | ORAL_TABLET | Freq: Every day | ORAL | 0 refills | Status: AC

## 2021-08-23 NOTE — Progress Notes (Signed)
Chief Complaint  ?Patient presents with  ? Cough  ?  Congestion ?Headache ?Sore throat ?Send prescription to [facility] in [place]  ? ? ?Jenna Campbell here for URI complaints. Due to COVID-19 pandemic, we are interacting via web portal for an electronic face-to-face visit. I verified patient's ID using 2 identifiers. Patient agreed to proceed with visit via this method. Patient is at home, I am at office. Patient and I are present for visit.  ? ?Duration: 4 days  ?Associated symptoms: sinus congestion, HA, sinus pain, rhinorrhea, sore throat, R ear pain, and cough ?Denies: itchy watery eyes, ear drainage, wheezing, shortness of breath, myalgia, and fevers, N/V/D ?Treatment to date: Mucinex, Tylenol, pushing fluids ?Sick contacts: No ?Went to UC, Rapid strep neg.  ? ?Past Medical History:  ?Diagnosis Date  ? Acute bronchitis due to other specified organisms 04/21/2015  ? Acute hyponatremia 11/19/2020  ? Acute tension-type headache 12/16/2015  ? Adjustment disorder with anxious mood 04/21/2015  ? Allergic asthma with acute exacerbation 09/22/2014  ? Allergic rhinitis due to pollen 07/23/2014  ? Allergic urticaria 05/13/2015  ? Allergy   ? Alopecia 10/30/2013  ? Angina pectoris ([place]) 07/29/2019  ? Asthma   ? Atrophic vaginitis 10/30/2013  ? Basal cell carcinoma 04/21/2015  ? Bilateral impacted cerumen 04/21/2015  ? CAD (coronary artery disease) 10/28/2019  ? Cervical radiculopathy 09/17/2015  ? Change in bowel function 06/14/2016  ? Chest pain in adult 05/28/2017  ? Chest tightness 07/29/2019  ? Chronic diarrhea 12/16/2015  ? Colon polyps   ? Cough 02/14/2016  ? COVID-19 virus infection 11/19/2020  ? Cystocele, midline 10/30/2013  ? Dermatitis 09/22/2014  ? 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 Diverticulosis of large intestine 10/30/2013  ? Dupuytren's contracture of both hands 09/17/2019  ? Dyspareunia 07/23/2014  ? Dysuria 12/16/2015  ? Encounter for long-term (current) use of other medications 10/19/2012  ? Esophageal  dysphagia 06/14/2016  ? Essential hypertension   ? Family history of pheochromocytoma 02/14/2016  ? Foot pain, bilateral 01/05/2018  ? Gastroesophageal reflux disease   ? Gastroesophageal reflux disease without esophagitis 05/13/2015  ? Generalized abdominal pain 12/16/2015  ? GERD (gastroesophageal reflux disease)   ? H/O: hysterectomy   ? Herpes simplex 08/17/2016  ? History of adenomatous polyp of colon 06/14/2016  ? History of blood transfusion 1970  ? Hx of migraines 05/15/2009  ? Hyperlipemia 12/10/2020  ? Hypothyroidism 10/30/2013  ? Hypotonic bladder 10/30/2013  ? Insomnia 10/11/2017  ? Localized edema 12/16/2015  ? Low back pain 09/17/2015  ? Lumbar paraspinal muscle spasm 04/21/2015  ? Major depression, recurrent ([place]) 07/14/2013  ? Major depressive disorder, recurrent, mild ([place]) 03/14/2017  ? Malaise and fatigue 04/21/2015  ? MCI (mild cognitive impairment) 08/18/2015  ? Memory difficulty 09/17/2015  ? Menopause 03/31/2014  ? Midline low back pain without sciatica 03/31/2014  ? Migraine   ? Mild intermittent extrinsic asthma 12/16/2015  ? Mild persistent asthma 05/13/2015  ? Mixed dyslipidemia 11/12/2019  ? Nausea vomiting and diarrhea 11/19/2020  ? Neck pain 09/17/2015  ? Neuropathy 09/22/2014  ? Nonintractable headache 11/02/2016  ? Other allergic rhinitis 05/13/2015  ? Palpitation 09/03/2017  ? Pedal edema 07/29/2019  ? Pelvic pain in female 02/17/2015  ? Personality disorder ([place]) 10/19/2012  ? Pharyngoesophageal dysphagia 04/21/2015  ? Plantar fasciitis 01/20/2016  ? Precordial chest pain 08/18/2015  ? Primary insomnia 10/11/2017  ? Prolapse of vaginal vault after hysterectomy 10/30/2013  ? Rectocele 10/30/2013  ? Seasonal allergies 10/15/2019  ? Senile purpura ([place])  06/15/2020  ? Shin splint 04/21/2015  ? Shortness of breath 07/17/2016  ? Sinusitis, chronic 10/30/2013  ? Slow transit constipation 07/23/2014  ? SOB (shortness of breath) 11/19/2020  ? Stable angina ([place]) 10/28/2019  ? Swelling of  lower limb 05/17/2016  ? Tension-type headache, not intractable 09/17/2015  ? Thyroid disease   ? Tinnitus of both ears 10/11/2017  ? Unstable angina ([place]) 07/29/2019  ? Urethral stricture 10/30/2013  ? Urinary frequency 02/23/2021  ? Urinary incontinence   ? Urinary incontinence without sensory awareness 07/17/2016  ? Varicose veins of both lower extremities 04/24/2016  ? Venous insufficiency (chronic) (peripheral) 06/06/2016  ? ? ?Objective ?No conversational dyspnea ?Age appropriate judgment and insight ?Nml affect and mood ? ?Acute maxillary sinusitis, recurrence not specified - Plan: predniSONE (DELTASONE) 20 MG tablet ? ?5 d pred burst, 40 mg/d, go back on [employer] Cont Mucinex and Tylenol prn.  ?Continue to push fluids, practice good hand hygiene, cover mouth when coughing. ?F/u prn. If starting to experience fevers, shaking, or shortness of breath, seek immediate care. ?Pt voiced understanding and agreement to the plan. ? ?Shelda Pal, DO ?08/23/21 ?2:26 PM ? ?

## 2021-09-06 ENCOUNTER — Ambulatory Visit: Payer: PPO | Admitting: Cardiology

## 2021-09-07 DIAGNOSIS — F329 Major depressive disorder, single episode, unspecified: Secondary | ICD-10-CM | POA: Diagnosis not present

## 2021-09-24 ENCOUNTER — Other Ambulatory Visit: Payer: Self-pay | Admitting: Family Medicine

## 2021-09-24 DIAGNOSIS — M7989 Other specified soft tissue disorders: Secondary | ICD-10-CM

## 2021-10-21 ENCOUNTER — Other Ambulatory Visit: Payer: Self-pay | Admitting: Family Medicine

## 2021-10-21 DIAGNOSIS — F5101 Primary insomnia: Secondary | ICD-10-CM | POA: Diagnosis not present

## 2021-10-21 DIAGNOSIS — F411 Generalized anxiety disorder: Secondary | ICD-10-CM

## 2021-10-21 DIAGNOSIS — F609 Personality disorder, unspecified: Secondary | ICD-10-CM | POA: Diagnosis not present

## 2021-10-21 DIAGNOSIS — F330 Major depressive disorder, recurrent, mild: Secondary | ICD-10-CM | POA: Diagnosis not present

## 2021-10-21 HISTORY — DX: Generalized anxiety disorder: F41.1

## 2021-10-21 NOTE — Telephone Encounter (Signed)
I'm not sure what this is for? Wasn't rx'd by me. Ty.

## 2021-11-04 DIAGNOSIS — R102 Pelvic and perineal pain: Secondary | ICD-10-CM | POA: Diagnosis not present

## 2021-11-04 DIAGNOSIS — K5900 Constipation, unspecified: Secondary | ICD-10-CM | POA: Diagnosis not present

## 2021-11-04 DIAGNOSIS — R399 Unspecified symptoms and signs involving the genitourinary system: Secondary | ICD-10-CM | POA: Diagnosis not present

## 2021-11-04 DIAGNOSIS — R300 Dysuria: Secondary | ICD-10-CM | POA: Diagnosis not present

## 2021-11-04 DIAGNOSIS — M545 Low back pain, unspecified: Secondary | ICD-10-CM | POA: Diagnosis not present

## 2021-12-01 DIAGNOSIS — F329 Major depressive disorder, single episode, unspecified: Secondary | ICD-10-CM | POA: Diagnosis not present

## 2021-12-19 ENCOUNTER — Ambulatory Visit (INDEPENDENT_AMBULATORY_CARE_PROVIDER_SITE_OTHER): Payer: PPO

## 2021-12-19 DIAGNOSIS — Z0000 Encounter for general adult medical examination without abnormal findings: Secondary | ICD-10-CM

## 2021-12-19 DIAGNOSIS — J4540 Moderate persistent asthma, uncomplicated: Secondary | ICD-10-CM | POA: Diagnosis not present

## 2021-12-19 MED ORDER — ALBUTEROL SULFATE HFA 108 (90 BASE) MCG/ACT IN AERS: 2.0000 | INHALATION_SPRAY | RESPIRATORY_TRACT | 1 refills | Status: DC | PRN

## 2021-12-19 NOTE — Patient Instructions (Signed)
Jenna Campbell , Thank you for taking time to come for your Medicare Wellness Visit. I appreciate your ongoing commitment to your health goals. Please review the following plan we discussed and let me know if I can assist you in the future.   Screening recommendations/referrals: Colonoscopy: declined Mammogram: 09/21/20 due 09/22/22 Bone Density: 09/21/20 due 09/22/23 Recommended yearly ophthalmology/optometry visit for glaucoma screening and checkup Recommended yearly dental visit for hygiene and checkup  Vaccinations: Influenza vaccine: up to date Pneumococcal vaccine: up to date Tdap vaccine: up to date Shingles vaccine: Due-May obtain vaccine at your local pharmacy.    Covid-19:Due-May obtain vaccine at your local pharmacy.   Advanced directives: yes, not on file  Conditions/risks identified: see problem list   Next appointment: Follow up in one year for your annual wellness visit    Preventive Care 65 Years and Older, Female Preventive care refers to lifestyle choices and visits with your health care provider that can promote health and wellness. What does preventive care include? A yearly physical exam. This is also called an annual well check. Dental exams once or twice a year. Routine eye exams. Ask your health care provider how often you should have your eyes checked. Personal lifestyle choices, including: Daily care of your teeth and gums. Regular physical activity. Eating a healthy diet. Avoiding tobacco and drug use. Limiting alcohol use. Practicing safe sex. Taking low-dose aspirin every day. Taking vitamin and mineral supplements as recommended by your health care provider. What happens during an annual well check? The services and screenings done by your health care provider during your annual well check will depend on your age, overall health, lifestyle risk factors, and family history of disease. Counseling  Your health care provider may ask you questions about  your: Alcohol use. Tobacco use. Drug use. Emotional well-being. Home and relationship well-being. Sexual activity. Eating habits. History of falls. Memory and ability to understand (cognition). Work and work Statistician. Reproductive health. Screening  You may have the following tests or measurements: Height, weight, and BMI. Blood pressure. Lipid and cholesterol levels. These may be checked every 5 years, or more frequently if you are over 59 years old. Skin check. Lung cancer screening. You may have this screening every year starting at age 65 if you have a 30-pack-year history of smoking and currently smoke or have quit within the past 15 years. Fecal occult blood test (FOBT) of the stool. You may have this test every year starting at age 40. Flexible sigmoidoscopy or colonoscopy. You may have a sigmoidoscopy every 5 years or a colonoscopy every 10 years starting at age 42. Hepatitis C blood test. Hepatitis B blood test. Sexually transmitted disease (STD) testing. Diabetes screening. This is done by checking your blood sugar (glucose) after you have not eaten for a while (fasting). You may have this done every 1-3 years. Bone density scan. This is done to screen for osteoporosis. You may have this done starting at age 71. Mammogram. This may be done every 1-2 years. Talk to your health care provider about how often you should have regular mammograms. Talk with your health care provider about your test results, treatment options, and if necessary, the need for more tests. Vaccines  Your health care provider may recommend certain vaccines, such as: Influenza vaccine. This is recommended every year. Tetanus, diphtheria, and acellular pertussis (Tdap, Td) vaccine. You may need a Td booster every 10 years. Zoster vaccine. You may need this after age 87. Pneumococcal 13-valent conjugate (PCV13) vaccine.  One dose is recommended after age 40. Pneumococcal polysaccharide (PPSV23) vaccine.  One dose is recommended after age 1. Talk to your health care provider about which screenings and vaccines you need and how often you need them. This information is not intended to replace advice given to you by your health care provider. Make sure you discuss any questions you have with your health care provider. Document Released: 05/28/2015 Document Revised: 01/19/2016 Document Reviewed: 03/02/2015 Elsevier Interactive Patient Education  2017 [place] Prevention in the Home Falls can cause injuries. They can happen to people of all ages. There are many things you can do to make your home safe and to help prevent falls. What can I do on the outside of my home? Regularly fix the edges of walkways and driveways and fix any cracks. Remove anything that might make you trip as you walk through a door, such as a raised step or threshold. Trim any bushes or trees on the path to your home. Use bright outdoor lighting. Clear any walking paths of anything that might make someone trip, such as rocks or tools. Regularly check to see if handrails are loose or broken. Make sure that both sides of any steps have handrails. Any raised decks and porches should have guardrails on the edges. Have any leaves, snow, or ice cleared regularly. Use sand or salt on walking paths during winter. Clean up any spills in your garage right away. This includes oil or grease spills. What can I do in the bathroom? Use night lights. Install grab bars by the toilet and in the tub and shower. Do not use towel bars as grab bars. Use non-skid mats or decals in the tub or shower. If you need to sit down in the shower, use a plastic, non-slip stool. Keep the floor dry. Clean up any water that spills on the floor as soon as it happens. Remove soap buildup in the tub or shower regularly. Attach bath mats securely with double-sided non-slip rug tape. Do not have throw rugs and other things on the floor that can make  you trip. What can I do in the bedroom? Use night lights. Make sure that you have a light by your bed that is easy to reach. Do not use any sheets or blankets that are too big for your bed. They should not hang down onto the floor. Have a firm chair that has side arms. You can use this for support while you get dressed. Do not have throw rugs and other things on the floor that can make you trip. What can I do in the kitchen? Clean up any spills right away. Avoid walking on wet floors. Keep items that you use a lot in easy-to-reach places. If you need to reach something above you, use a strong step stool that has a grab bar. Keep electrical cords out of the way. Do not use floor polish or wax that makes floors slippery. If you must use wax, use non-skid floor wax. Do not have throw rugs and other things on the floor that can make you trip. What can I do with my stairs? Do not leave any items on the stairs. Make sure that there are handrails on both sides of the stairs and use them. Fix handrails that are broken or loose. Make sure that handrails are as long as the stairways. Check any carpeting to make sure that it is firmly attached to the stairs. Fix any carpet that is loose or  worn. Avoid having throw rugs at the top or bottom of the stairs. If you do have throw rugs, attach them to the floor with carpet tape. Make sure that you have a light switch at the top of the stairs and the bottom of the stairs. If you do not have them, ask someone to add them for you. What else can I do to help prevent falls? Wear shoes that: Do not have high heels. Have rubber bottoms. Are comfortable and fit you well. Are closed at the toe. Do not wear sandals. If you use a stepladder: Make sure that it is fully opened. Do not climb a closed stepladder. Make sure that both sides of the stepladder are locked into place. Ask someone to hold it for you, if possible. Clearly mark and make sure that you can  see: Any grab bars or handrails. First and last steps. Where the edge of each step is. Use tools that help you move around (mobility aids) if they are needed. These include: Canes. Walkers. Scooters. Crutches. Turn on the lights when you go into a dark area. Replace any light bulbs as soon as they burn out. Set up your furniture so you have a clear path. Avoid moving your furniture around. If any of your floors are uneven, fix them. If there are any pets around you, be aware of where they are. Review your medicines with your doctor. Some medicines can make you feel dizzy. This can increase your chance of falling. Ask your doctor what other things that you can do to help prevent falls. This information is not intended to replace advice given to you by your health care provider. Make sure you discuss any questions you have with your health care provider. Document Released: 02/25/2009 Document Revised: 10/07/2015 Document Reviewed: 06/05/2014 Elsevier Interactive Patient Education  2017 [employer].

## 2021-12-19 NOTE — Progress Notes (Signed)
Subjective:   Jenna Campbell is a 71 y.o. female who presents for Medicare Annual (Subsequent) preventive examination.  I connected with  Jenna Campbell on 12/19/21 by a audio enabled telemedicine application and verified that I am speaking with the correct person using two identifiers.  Patient Location: Home  Provider Location: Office/Clinic  I discussed the limitations of evaluation and management by telemedicine. The patient expressed understanding and agreed to proceed.   Review of Systems     Cardiac Risk Factors include: advanced age (>41mn, >>78women);hypertension;dyslipidemia     Objective:    There were no vitals filed for this visit. There is no height or weight on file to calculate BMI.     12/19/2021   11:46 AM 07/01/2021    2:32 PM 12/14/2020   11:45 AM 11/19/2020    8:39 PM 11/18/2020   11:51 PM 07/27/2019    6:24 PM 11/28/2017    4:17 PM  Advanced Directives  Does Patient Have a Medical Advance Directive? Yes No Yes No No Yes No  Type of AParamedicof ADeltaLiving will  HGeraldLiving will   HCalpella  Does patient want to make changes to medical advance directive? No - Patient declined     No - Patient declined   Copy of HGreeleyin Chart? No - copy requested  Yes - validated most recent copy scanned in chart (See row information)      Would patient like information on creating a medical advance directive?  No - Patient declined  No - Patient declined   No - Patient declined    Current Medications (verified) Outpatient Encounter Medications as of 12/19/2021  Medication Sig   acetaminophen (TYLENOL) 500 MG tablet Take 500 mg by mouth every 6 (six) hours as needed for moderate pain.   albuterol (PROAIR HFA) 108 (90 Base) MCG/ACT inhaler Inhale 2 puffs into the lungs every 4 (four) hours as needed for wheezing or shortness of breath.   albuterol (PROVENTIL) (2.5 MG/3ML) 0.083%  nebulizer solution INHALE 3 ML BY NEBULIZATION EVERY 6 HOURS AS NEEDED FOR WHEEZING OR SHORTNESS OF BREATH   ASPIRIN 81 PO Take 81 mg by mouth daily.    azelastine (ASTELIN) 0.1 % nasal spray Place 2 sprays into both nostrils 2 (two) times daily. Use in each nostril as directed   betamethasone valerate (VALISONE) 0.1 % cream APPLY TO AFFECTED AREA TWICE A DAY   Calcium-Phosphorus-Vitamin D (CALCIUM GUMMIES PO) Take by mouth.   cholecalciferol (VITAMIN D3) 25 MCG (1000 UNIT) tablet Take 1,000 Units by mouth daily.   cholestyramine (QUESTRAN) 4 GM/DOSE powder Take 1 packet (4 g total) by mouth 3 (three) times daily with meals.   Coenzyme Q10 (CO Q-10) 200 MG CAPS Take 200 mg by mouth daily.   fluticasone (FLONASE) 50 MCG/ACT nasal spray Place 2 sprays into both nostrils as needed for rhinitis or allergies.   fluticasone-salmeterol (ADVAIR DISKUS) 250-50 MCG/ACT AEPB TAKE 1 PUFF BY MOUTH TWICE A DAY   furosemide (LASIX) 40 MG tablet TAKE 1 TABLET BY MOUTH EVERY DAY   gabapentin (NEURONTIN) 300 MG capsule Take 300 mg by mouth in the morning.   ibuprofen (ADVIL) 200 MG tablet Take 400 mg by mouth every 6 (six) hours as needed for mild pain.   ipratropium-albuterol (DUONEB) 0.5-2.5 (3) MG/3ML SOLN Take 3 mLs by nebulization every 4 (four) hours as needed for wheezing or shortness of breath.  isosorbide mononitrate (IMDUR) 30 MG 24 hr tablet TAKE 1/2 TABLET BY MOUTH DAILY   levocetirizine (XYZAL) 5 MG tablet Take 5 mg by mouth daily.   levothyroxine (SYNTHROID) 150 MCG tablet TAKE 1.5 TABLETS (225 MCG TOTAL) BY MOUTH DAILY BEFORE BREAKFAST.   LORazepam (ATIVAN) 0.5 MG tablet Take 0.5 mg by mouth every 8 (eight) hours as needed for anxiety.    nitroGLYCERIN (NITROSTAT) 0.4 MG SL tablet Place 1 tablet (0.4 mg total) under the tongue every 5 (five) minutes as needed for chest pain.   olopatadine (PATANOL) 0.1 % ophthalmic solution Place 1 drop into both eyes 2 (two) times daily.   pantoprazole (PROTONIX)  40 MG tablet Take 1 tablet (40 mg total) by mouth 2 (two) times daily.   potassium chloride (KLOR-CON) 10 MEQ tablet TAKE 2 TABLETS WITH EVERY DOSE OF LASIX 40 MG.   promethazine-phenylephrine (PROMETHAZINE VC) 6.25-5 MG/5ML SYRP Take 5 mLs by mouth every 6 (six) hours as needed for congestion.   temazepam (RESTORIL) 15 MG capsule Take 15 mg by mouth at bedtime.    topiramate (TOPAMAX) 100 MG tablet Take 100 mg by mouth at bedtime.    Ubiquinol 100 MG CAPS Take by mouth.   valACYclovir (VALTREX) 1000 MG tablet TAKE 1 TABLET BY MOUTH ONCE DAILY FOR 5 DAYS DURING OUTBREAKS.   No facility-administered encounter medications on file as of 12/19/2021.    Allergies (verified) Iodinated contrast media, Kenalog [triamcinolone acetonide], Metrizamide, Other, Penicillins, Crestor [rosuvastatin], Lipitor [atorvastatin], Montelukast sodium, Pitavastatin, Xyzal [levocetirizine], Ciprofloxacin, Diazepam, Latex, and Sulfa antibiotics   History: Past Medical History:  Diagnosis Date   Acute bronchitis due to other specified organisms 04/21/2015   Acute hyponatremia 11/19/2020   Acute tension-type headache 12/16/2015   Adjustment disorder with anxious mood 04/21/2015   Allergic asthma with acute exacerbation 09/22/2014   Allergic rhinitis due to pollen 07/23/2014   Allergic urticaria 05/13/2015   Allergy    Alopecia 10/30/2013   Angina pectoris ([place]) 07/29/2019   Asthma    Atrophic vaginitis 10/30/2013   Basal cell carcinoma 04/21/2015   Bilateral impacted cerumen 04/21/2015   CAD (coronary artery disease) 10/28/2019   Cervical radiculopathy 09/17/2015   Change in bowel function 06/14/2016   Chest pain in adult 05/28/2017   Chest tightness 07/29/2019   Chronic diarrhea 12/16/2015   Colon polyps    Cough 02/14/2016   COVID-19 virus infection 11/19/2020   Cystocele, midline 10/30/2013   Dermatitis 09/22/2014   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   Diverticulosis of large intestine 10/30/2013    Dupuytren's contracture of both hands 09/17/2019   Dyspareunia 07/23/2014   Dysuria 12/16/2015   Encounter for long-term (current) use of other medications 10/19/2012   Esophageal dysphagia 06/14/2016   Essential hypertension    Family history of pheochromocytoma 02/14/2016   Foot pain, bilateral 01/05/2018   Gastroesophageal reflux disease    Gastroesophageal reflux disease without esophagitis 05/13/2015   Generalized abdominal pain 12/16/2015   GERD (gastroesophageal reflux disease)    H/O: hysterectomy    Herpes simplex 08/17/2016   History of adenomatous polyp of colon 06/14/2016   History of blood transfusion 1970   Hx of migraines 05/15/2009   Hyperlipemia 12/10/2020   Hypothyroidism 10/30/2013   Hypotonic bladder 10/30/2013   Insomnia 10/11/2017   Localized edema 12/16/2015   Low back pain 09/17/2015   Lumbar paraspinal muscle spasm 04/21/2015   Major depression, recurrent ([place]) 07/14/2013   Major depressive disorder, recurrent, mild ([place]) 03/14/2017   Malaise and fatigue 04/21/2015  MCI (mild cognitive impairment) 08/18/2015   Memory difficulty 09/17/2015   Menopause 03/31/2014   Midline low back pain without sciatica 03/31/2014   Migraine    Mild intermittent extrinsic asthma 12/16/2015   Mild persistent asthma 05/13/2015   Mixed dyslipidemia 11/12/2019   Nausea vomiting and diarrhea 11/19/2020   Neck pain 09/17/2015   Neuropathy 09/22/2014   Nonintractable headache 11/02/2016   Other allergic rhinitis 05/13/2015   Palpitation 09/03/2017   Pedal edema 07/29/2019   Pelvic pain in female 02/17/2015   Personality disorder ([place]) 10/19/2012   Pharyngoesophageal dysphagia 04/21/2015   Plantar fasciitis 01/20/2016   Precordial chest pain 08/18/2015   Primary insomnia 10/11/2017   Prolapse of vaginal vault after hysterectomy 10/30/2013   Rectocele 10/30/2013   Seasonal allergies 10/15/2019   Senile purpura ([place]) 06/15/2020   Shin splint 04/21/2015   Shortness of  breath 07/17/2016   Sinusitis, chronic 10/30/2013   Slow transit constipation 07/23/2014   SOB (shortness of breath) 11/19/2020   Stable angina ([facility]) 10/28/2019   Swelling of lower limb 05/17/2016   Tension-type headache, not intractable 09/17/2015   Thyroid disease    Tinnitus of both ears 10/11/2017   Unstable angina ([place]) 07/29/2019   Urethral stricture 10/30/2013   Urinary frequency 02/23/2021   Urinary incontinence    Urinary incontinence without sensory awareness 07/17/2016   Varicose veins of both lower extremities 04/24/2016   Venous insufficiency (chronic) (peripheral) 06/06/2016   Past Surgical History:  Procedure Laterality Date   BREAST CYST ASPIRATION Left    25 years ago    COLONOSCOPY  04/2016   [facility] Diverticulitis and Multiple colon polyps   ESOPHAGOGASTRODUODENOSCOPY  04/2016   [facility]   INTRAVASCULAR PRESSURE WIRE/FFR STUDY N/A 10/17/2019   Procedure: INTRAVASCULAR PRESSURE WIRE/FFR STUDY;  Surgeon: Nelva Bush, MD;  Location: [facility];  Service: Cardiovascular;  Laterality: N/A;   LEFT HEART CATH AND CORONARY ANGIOGRAPHY N/A 10/17/2019   Procedure: LEFT HEART CATH AND CORONARY ANGIOGRAPHY;  Surgeon: Nelva Bush, MD;  Location: [facility];  Service: Cardiovascular;  Laterality: N/A;   MOHS SURGERY  10/05/2015   RECTOCELE REPAIR  2011   SKIN CANCER EXCISION  02/2015   Squamous cell removed from back   TONSILLECTOMY     VAGINAL HYSTERECTOMY     VAGINAL PROLAPSE REPAIR     Family History  Problem Relation Age of Onset   Asthma Father    Angina Father    Emphysema Father    Alzheimer's disease Mother    Cancer Sister        died of cancer   Colon cancer Neg Hx    Esophageal cancer Neg Hx    Social History   Socioeconomic History   Marital status: Divorced    Spouse name: Not on file   Number of children: Not on file   Years of education: Not on file   Highest education level: Not on file  Occupational History    Not on file  Tobacco Use   Smoking status: Never   Smokeless tobacco: Never  Vaping Use   Vaping Use: Never used  Substance and Sexual Activity   Alcohol use: Not Currently   Drug use: No   Sexual activity: Never    Partners: Male  Other Topics Concern   Not on file  Social History Narrative   Not on file   Social Determinants of Health   Financial Resource Strain: Low Risk  (12/14/2020)  Overall Financial Resource Strain (CARDIA)    Difficulty of Paying Living Expenses: Not hard at all  Food Insecurity: No Food Insecurity (12/14/2020)   Hunger Vital Sign    Worried About Running Out of Food in the Last Year: Never true    Ran Out of Food in the Last Year: Never true  Transportation Needs: No Transportation Needs (12/14/2020)   PRAPARE - Hydrologist (Medical): No    Lack of Transportation (Non-Medical): No  Physical Activity: Inactive (12/14/2020)   Exercise Vital Sign    Days of Exercise per Week: 0 days    Minutes of Exercise per Session: 0 min  Stress: No Stress Concern Present (12/14/2020)   [place]    Feeling of Stress : Not at all  Social Connections: Moderately Integrated (12/14/2020)   Social Connection and Isolation Panel [NHANES]    Frequency of Communication with Friends and Family: More than three times a week    Frequency of Social Gatherings with Friends and Family: More than three times a week    Attends Religious Services: More than 4 times per year    Active Member of [employer] or Organizations: Yes    Attends Music therapist: More than 4 times per year    Marital Status: Divorced    Tobacco Counseling Counseling given: Not Answered   Clinical Intake:  Pre-visit preparation completed: Yes  Pain : No/denies pain     BMI - recorded: 24.55 Nutritional Status: BMI of 19-24  Normal Nutritional Risks: None Diabetes: No  How often do you need to  have someone help you when you read instructions, pamphlets, or other written materials from your doctor or pharmacy?: 1 - Never  Diabetic?no  Interpreter Needed?: No  Information entered by :: Dquan Cortopassi   Activities of Daily Living    12/19/2021   11:51 AM  In your present state of health, do you have any difficulty performing the following activities:  Hearing? 0  Vision? 0  Difficulty concentrating or making decisions? 0  Walking or climbing stairs? 1  Dressing or bathing? 0  Doing errands, shopping? 0  Preparing Food and eating ? N  Using the Toilet? N  In the past six months, have you accidently leaked urine? N  Do you have problems with loss of bowel control? N  Managing your Medications? N  Managing your Finances? N  Housekeeping or managing your Housekeeping? N    Patient Care Team: Shelda Pal, DO as PCP - General (Family Medicine) Revankar, Reita Cliche, MD as PCP - Cardiology (Cardiology)  Indicate any recent Medical Services you may have received from other than [facility] providers in the past year (date may be approximate).     Assessment:   This is a routine wellness examination for Chiy[place]o.  Hearing/Vision screen No results found.  Dietary issues and exercise activities discussed: Current Exercise Habits: Home exercise routine, Type of exercise: walking;stretching, Time (Minutes): 30, Frequency (Times/Week): 7, Weekly Exercise (Minutes/Week): 210, Intensity: Mild, Exercise limited by: None identified   Goals Addressed             This Visit's Progress    Patient Stated   On track    Increase activity with walking & strength training       Depression Screen    12/19/2021   11:51 AM 12/19/2021   11:49 AM 02/23/2021   10:19 AM 12/14/2020   11:51 AM  07/05/2017    2:30 PM  PHQ 2/9 Scores  PHQ - 2 Score 0 0 0 0 0    Fall Risk    12/19/2021   11:49 AM 02/23/2021   10:19 AM 12/14/2020   11:49 AM 12/09/2019    1:44 PM 04/09/2019   10:55 AM   Fall Risk   Falls in the past year? 0 0 0 0 0  Comment    [employer] Telephone Survey: data to providers prior to load [employer] Survey: data to providers prior to load  Number falls in past yr: 0 0 0    Injury with Fall? 0 0 0    Risk for fall due to : No Fall Risks      Follow up Falls evaluation completed  Falls prevention discussed      [place]:  Any stairs in or around the home? No  If so, are there any without handrails?  N/a Home free of loose throw rugs in walkways, pet beds, electrical cords, etc? Yes  Adequate lighting in your home to reduce risk of falls? Yes   ASSISTIVE DEVICES UTILIZED TO PREVENT FALLS:  Life alert? Yes  Use of a cane, walker or w/c? Yes  Grab bars in the bathroom? Yes  Shower chair or bench in shower? Yes  Elevated toilet seat or a handicapped toilet? Yes   TIMED UP AND GO:  Was the test performed? No .    Cognitive Function:    07/05/2017    2:31 PM  MMSE - Mini Mental State Exam  Orientation to time 5  Orientation to Place 5  Registration 3  Attention/ Calculation 5  Recall 3  Language- name 2 objects 2  Language- repeat 1  Language- follow 3 step command 3  Language- read & follow direction 1  Write a sentence 1  Copy design 1  Total score 30        12/19/2021   12:04 PM  6CIT Screen  What Year? 0 points  What month? 0 points  What time? 0 points  Count back from 20 0 points  Months in reverse 0 points  Repeat phrase 0 points  Total Score 0 points    Immunizations Immunization History  Administered Date(s) Administered   Influenza, High Dose Seasonal PF 03/01/2017, 02/28/2018, 03/17/2021   Influenza,inj,Quad PF,6+ Mos 04/16/2019   Influenza,inj,quad, With Preservative 02/24/2014   Influenza-Unspecified 02/25/2013, 02/25/2013, 02/24/2014, 01/25/2015, 01/25/2015, 06/30/2016, 06/30/2016   PFIZER Comirnaty(Gray Top)Covid-19 Tri-Sucrose Vaccine 03/01/2020   PFIZER(Purple Top)SARS-COV-2  Vaccination 06/26/2019, 07/21/2019, 03/01/2020   PNEUMOCOCCAL CONJUGATE-20 05/13/2021   Pneumococcal Polysaccharide-23 12/22/2014   Tdap 05/13/2021    TDAP status: Up to date  Flu Vaccine status: Up to date  Pneumococcal vaccine status: Up to date  Covid-19 vaccine status: Information provided on how to obtain vaccines.   Qualifies for Shingles Vaccine? No   Zostavax completed No   Shingrix Completed?: No.    Education has been provided regarding the importance of this vaccine. Patient has been advised to call insurance company to determine out of pocket expense if they have not yet received this vaccine. Advised may also receive vaccine at local pharmacy or Health Dept. Verbalized acceptance and understanding.  Screening Tests Health Maintenance  Topic Date Due   Zoster Vaccines- Shingrix (1 of 2) Never done   COLONOSCOPY (Pts 45-82yr Insurance coverage will need to be confirmed)  04/15/2019   COVID-19 Vaccine (5 - Booster for [place]) 04/26/2020  INFLUENZA VACCINE  12/13/2021   MAMMOGRAM  09/22/2022   TETANUS/TDAP  05/14/2031   Pneumonia Vaccine 9+ Years old  Completed   DEXA SCAN  Completed   Hepatitis C Screening  Completed   HPV VACCINES  Aged Out    Health Maintenance  Health Maintenance Due  Topic Date Due   Zoster Vaccines- Shingrix (1 of 2) Never done   COLONOSCOPY (Pts 45-45yr Insurance coverage will need to be confirmed)  04/15/2019   COVID-19 Vaccine (5 - Booster for Pfizer series) 04/26/2020   INFLUENZA VACCINE  12/13/2021    Colorectal cancer screening: Referral to GI placed decline. Pt aware the office will call re: appt.  Mammogram status: Completed 09/21/20. Repeat every year every 2 years  Bone Density status: Completed 09/21/20. Results reflect: Bone density results: OSTEOPENIA. Repeat every 3 years.  Lung Cancer Screening: (Low Dose CT Chest recommended if Age 72-80years, 30 pack-year currently smoking OR have quit w/in 15years.) does not  qualify.   Lung Cancer Screening Referral: n/a  Additional Screening:  Hepatitis C Screening: does qualify; Completed 05/13/09  Vision Screening: Recommended annual ophthalmology exams for early detection of glaucoma and other disorders of the eye. Is the patient up to date with their annual eye exam?  No  Who is the provider or what is the name of the office in which the patient attends annual eye exams? N/a If pt is not established with a provider, would they like to be referred to a provider to establish care? No .   Dental Screening: Recommended annual dental exams for proper oral hygiene  Community Resource Referral / Chronic Care Management: CRR required this visit?  No   CCM required this visit?  No      Plan:     I have personally reviewed and noted the following in the patient's chart:   Medical and social history Use of alcohol, tobacco or illicit drugs  Current medications and supplements including opioid prescriptions.  Functional ability and status Nutritional status Physical activity Advanced directives List of other physicians Hospitalizations, surgeries, and ER visits in previous 12 months Vitals Screenings to include cognitive, depression, and falls Referrals and appointments  In addition, I have reviewed and discussed with patient certain preventive protocols, quality metrics, and best practice recommendations. A written personalized care plan for preventive services as well as general preventive health recommendations were provided to patient.   Due to this being a telephonic visit, the after visit summary with patients personalized plan was offered to patient via mail or my-chart. Patient would like to access on my-chart.   SDuard BradyChism, CGering  12/19/2021   Nurse Notes: none

## 2021-12-22 DIAGNOSIS — F329 Major depressive disorder, single episode, unspecified: Secondary | ICD-10-CM | POA: Diagnosis not present

## 2022-01-09 ENCOUNTER — Telehealth: Payer: Self-pay | Admitting: Family Medicine

## 2022-01-09 NOTE — Telephone Encounter (Signed)
Pharmacy called stating that they no longer stock the levothyroxine that they used to and will be changing manufacturers and wanted to let the PCP know

## 2022-01-10 ENCOUNTER — Ambulatory Visit: Payer: PPO | Admitting: Family Medicine

## 2022-01-10 ENCOUNTER — Ambulatory Visit: Payer: Self-pay

## 2022-01-10 ENCOUNTER — Ambulatory Visit (HOSPITAL_BASED_OUTPATIENT_CLINIC_OR_DEPARTMENT_OTHER)
Admission: RE | Admit: 2022-01-10 | Discharge: 2022-01-10 | Disposition: A | Discharge status: Home / Self Care | Payer: PPO | Source: Ambulatory Visit | Attending: Family Medicine | Admitting: Family Medicine

## 2022-01-10 ENCOUNTER — Encounter: Payer: Self-pay | Admitting: Family Medicine

## 2022-01-10 VITALS — BP 148/80 | Ht 66.0 in | Wt 138.0 lb

## 2022-01-10 DIAGNOSIS — S6992XA Unspecified injury of left wrist, hand and finger(s), initial encounter: Secondary | ICD-10-CM | POA: Diagnosis not present

## 2022-01-10 DIAGNOSIS — M1812 Unilateral primary osteoarthritis of first carpometacarpal joint, left hand: Secondary | ICD-10-CM

## 2022-01-10 DIAGNOSIS — M79645 Pain in left finger(s): Secondary | ICD-10-CM | POA: Diagnosis not present

## 2022-01-10 HISTORY — DX: Unilateral primary osteoarthritis of first carpometacarpal joint, left hand: M18.12

## 2022-01-10 MED ORDER — HYDROCODONE-ACETAMINOPHEN 5-325 MG PO TABS: 1.0000 | ORAL_TABLET | Freq: Three times a day (TID) | ORAL | 0 refills | Status: DC | PRN

## 2022-01-10 NOTE — Assessment & Plan Note (Signed)
Indicating either an exacerbation of her underlying arthritis versus a fracture of the base of her thumb.  Independent review of the x-ray today does not demonstrate a fracture. -Counseled on home exercise therapy and supportive care. -X-ray. -Norco. -Splint today. -Follow up in 1 week.

## 2022-01-10 NOTE — Patient Instructions (Signed)
Good to see you Please try the splint  Please use the pain medicine as needed   Please send me a message in MyChart with any questions or updates.  Please see me back in 1 week.   --Dr. Raeford Razor

## 2022-01-10 NOTE — Progress Notes (Signed)
Jenna Campbell - 71 y.o. female MRN [id number]  Date of birth: 1950/11/29  SUBJECTIVE:  Including CC & ROS.  No chief complaint on file.   Jenna Campbell is a 71 y.o. female that is presenting with left thumb pain.  She had a trauma where a candle fell on her left CMC joint.  Now having swelling and significant pain.   Review of Systems See HPI   HISTORY: Past Medical, Surgical, Social, and Family History Reviewed & Updated per EMR.   Pertinent Historical Findings include:  Past Medical History:  Diagnosis Date   Acute bronchitis due to other specified organisms 04/21/2015   Acute hyponatremia 11/19/2020   Acute tension-type headache 12/16/2015   Adjustment disorder with anxious mood 04/21/2015   Allergic asthma with acute exacerbation 09/22/2014   Allergic rhinitis due to pollen 07/23/2014   Allergic urticaria 05/13/2015   Allergy    Alopecia 10/30/2013   Angina pectoris ([place]) 07/29/2019   Asthma    Atrophic vaginitis 10/30/2013   Basal cell carcinoma 04/21/2015   Bilateral impacted cerumen 04/21/2015   CAD (coronary artery disease) 10/28/2019   Cervical radiculopathy 09/17/2015   Change in bowel function 06/14/2016   Chest pain in adult 05/28/2017   Chest tightness 07/29/2019   Chronic diarrhea 12/16/2015   Colon polyps    Cough 02/14/2016   COVID-19 virus infection 11/19/2020   Cystocele, midline 10/30/2013   Dermatitis 09/22/2014   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   Diverticulosis of large intestine 10/30/2013   Dupuytren's contracture of both hands 09/17/2019   Dyspareunia 07/23/2014   Dysuria 12/16/2015   Encounter for long-term (current) use of other medications 10/19/2012   Esophageal dysphagia 06/14/2016   Essential hypertension    Family history of pheochromocytoma 02/14/2016   Foot pain, bilateral 01/05/2018   Gastroesophageal reflux disease    Gastroesophageal reflux disease without esophagitis 05/13/2015   Generalized abdominal pain 12/16/2015   GERD  (gastroesophageal reflux disease)    H/O: hysterectomy    Herpes simplex 08/17/2016   History of adenomatous polyp of colon 06/14/2016   History of blood transfusion 1970   Hx of migraines 05/15/2009   Hyperlipemia 12/10/2020   Hypothyroidism 10/30/2013   Hypotonic bladder 10/30/2013   Insomnia 10/11/2017   Localized edema 12/16/2015   Low back pain 09/17/2015   Lumbar paraspinal muscle spasm 04/21/2015   Major depression, recurrent ([place]) 07/14/2013   Major depressive disorder, recurrent, mild ([place]) 03/14/2017   Malaise and fatigue 04/21/2015   MCI (mild cognitive impairment) 08/18/2015   Memory difficulty 09/17/2015   Menopause 03/31/2014   Midline low back pain without sciatica 03/31/2014   Migraine    Mild intermittent extrinsic asthma 12/16/2015   Mild persistent asthma 05/13/2015   Mixed dyslipidemia 11/12/2019   Nausea vomiting and diarrhea 11/19/2020   Neck pain 09/17/2015   Neuropathy 09/22/2014   Nonintractable headache 11/02/2016   Other allergic rhinitis 05/13/2015   Palpitation 09/03/2017   Pedal edema 07/29/2019   Pelvic pain in female 02/17/2015   Personality disorder ([place]) 10/19/2012   Pharyngoesophageal dysphagia 04/21/2015   Plantar fasciitis 01/20/2016   Precordial chest pain 08/18/2015   Primary insomnia 10/11/2017   Prolapse of vaginal vault after hysterectomy 10/30/2013   Rectocele 10/30/2013   Seasonal allergies 10/15/2019   Senile purpura ([place]) 06/15/2020   Shin splint 04/21/2015   Shortness of breath 07/17/2016   Sinusitis, chronic 10/30/2013   Slow transit constipation 07/23/2014   SOB (shortness of breath) 11/19/2020   Stable angina ([place]) 10/28/2019  Swelling of lower limb 05/17/2016   Tension-type headache, not intractable 09/17/2015   Thyroid disease    Tinnitus of both ears 10/11/2017   Unstable angina ([place]) 07/29/2019   Urethral stricture 10/30/2013   Urinary frequency 02/23/2021   Urinary incontinence    Urinary incontinence  without sensory awareness 07/17/2016   Varicose veins of both lower extremities 04/24/2016   Venous insufficiency (chronic) (peripheral) 06/06/2016    Past Surgical History:  Procedure Laterality Date   BREAST CYST ASPIRATION Left    25 years ago    COLONOSCOPY  04/2016   [facility] Diverticulitis and Multiple colon polyps   ESOPHAGOGASTRODUODENOSCOPY  04/2016   [facility]   INTRAVASCULAR PRESSURE WIRE/FFR STUDY N/A 10/17/2019   Procedure: INTRAVASCULAR PRESSURE WIRE/FFR STUDY;  Surgeon: Nelva Bush, MD;  Location: [facility];  Service: Cardiovascular;  Laterality: N/A;   LEFT HEART CATH AND CORONARY ANGIOGRAPHY N/A 10/17/2019   Procedure: LEFT HEART CATH AND CORONARY ANGIOGRAPHY;  Surgeon: Nelva Bush, MD;  Location: [facility];  Service: Cardiovascular;  Laterality: N/A;   MOHS SURGERY  10/05/2015   RECTOCELE REPAIR  2011   SKIN CANCER EXCISION  02/2015   Squamous cell removed from back   TONSILLECTOMY     VAGINAL HYSTERECTOMY     VAGINAL PROLAPSE REPAIR       PHYSICAL EXAM:  VS: BP (!) 148/80 (BP Location: Left Arm, Patient Position: Sitting)   Ht '5\' 6"'$  (1.676 m)   Wt 138 lb (62.6 kg)   BMI 22.27 kg/m  Physical Exam Gen: NAD, alert, cooperative with exam, well-appearing MSK:  Neurovascularly intact    Limited ultrasound: Left wrist and thumb:  No changes of the distal radius. Normal-appearing scaphoid. Increased hyperemia and effusion noted at the base of the thumb and the [facility] joint.  Summary: Findings concerning for fracture at the base of the thumb with arthritis of the [facility] joint.  Ultrasound and interpretation by Clearance Coots, MD  1. Thumb/wrist/hand 2. left 3. Thumb spica 4. Ortho-glass 5. Applied by Dr. Raeford Razor     ASSESSMENT & PLAN:   Arthritis of carpometacarpal [facility]) joint of left thumb Indicating either an exacerbation of her underlying arthritis versus a fracture of the base of her thumb.  Independent review of the  x-ray today does not demonstrate a fracture. -Counseled on home exercise therapy and supportive care. -X-ray. -Norco. -Splint today. -Follow up in 1 week.

## 2022-01-12 DIAGNOSIS — F329 Major depressive disorder, single episode, unspecified: Secondary | ICD-10-CM | POA: Diagnosis not present

## 2022-01-13 DIAGNOSIS — M545 Low back pain, unspecified: Secondary | ICD-10-CM | POA: Diagnosis not present

## 2022-01-13 DIAGNOSIS — R519 Headache, unspecified: Secondary | ICD-10-CM | POA: Diagnosis not present

## 2022-01-18 ENCOUNTER — Ambulatory Visit: Payer: PPO | Admitting: Family Medicine

## 2022-02-18 DIAGNOSIS — R208 Other disturbances of skin sensation: Secondary | ICD-10-CM | POA: Diagnosis not present

## 2022-02-18 DIAGNOSIS — B029 Zoster without complications: Secondary | ICD-10-CM | POA: Diagnosis not present

## 2022-02-18 DIAGNOSIS — I498 Other specified cardiac arrhythmias: Secondary | ICD-10-CM | POA: Diagnosis not present

## 2022-02-20 ENCOUNTER — Ambulatory Visit (INDEPENDENT_AMBULATORY_CARE_PROVIDER_SITE_OTHER): Payer: PPO | Admitting: Family Medicine

## 2022-02-20 ENCOUNTER — Encounter: Payer: Self-pay | Admitting: Family Medicine

## 2022-02-20 VITALS — BP 140/80 | HR 59 | Temp 98.0°F | Ht 65.0 in | Wt 142.0 lb

## 2022-02-20 DIAGNOSIS — Z1211 Encounter for screening for malignant neoplasm of colon: Secondary | ICD-10-CM

## 2022-02-20 DIAGNOSIS — R52 Pain, unspecified: Secondary | ICD-10-CM

## 2022-02-20 MED ORDER — VENLAFAXINE HCL ER 37.5 MG PO CP24: 37.5000 mg | ORAL_CAPSULE | Freq: Every day | ORAL | 0 refills | Status: DC

## 2022-02-20 NOTE — Progress Notes (Signed)
Chief Complaint  Patient presents with   Follow-up    Urgent Care  Shingles    Jenna Campbell is a 71 y.o. female here for a skin complaint.  Duration: 3 days Location: Side of chest on R Pruritic? No Painful? Yes Drainage? No New soaps/lotions/topicals/detergents? No Sick contacts? No Other associated symptoms: was red and bumpy 2 d ago Therapies tried thus far: Valtrex  Past Medical History:  Diagnosis Date   Acute bronchitis due to other specified organisms 04/21/2015   Acute hyponatremia 11/19/2020   Acute tension-type headache 12/16/2015   Adjustment disorder with anxious mood 04/21/2015   Allergic asthma with acute exacerbation 09/22/2014   Allergic rhinitis due to pollen 07/23/2014   Allergic urticaria 05/13/2015   Allergy    Alopecia 10/30/2013   Angina pectoris ([place]) 07/29/2019   Asthma    Atrophic vaginitis 10/30/2013   Basal cell carcinoma 04/21/2015   Bilateral impacted cerumen 04/21/2015   CAD (coronary artery disease) 10/28/2019   Cervical radiculopathy 09/17/2015   Change in bowel function 06/14/2016   Chest pain in adult 05/28/2017   Chest tightness 07/29/2019   Chronic diarrhea 12/16/2015   Colon polyps    Cough 02/14/2016   COVID-19 virus infection 11/19/2020   Cystocele, midline 10/30/2013   Dermatitis 09/22/2014   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   Diverticulosis of large intestine 10/30/2013   Dupuytren's contracture of both hands 09/17/2019   Dyspareunia 07/23/2014   Dysuria 12/16/2015   Encounter for long-term (current) use of other medications 10/19/2012   Esophageal dysphagia 06/14/2016   Essential hypertension    Family history of pheochromocytoma 02/14/2016   Foot pain, bilateral 01/05/2018   Gastroesophageal reflux disease    Gastroesophageal reflux disease without esophagitis 05/13/2015   Generalized abdominal pain 12/16/2015   GERD (gastroesophageal reflux disease)    H/O: hysterectomy    Herpes simplex 08/17/2016   History of  adenomatous polyp of colon 06/14/2016   History of blood transfusion 1970   Hx of migraines 05/15/2009   Hyperlipemia 12/10/2020   Hypothyroidism 10/30/2013   Hypotonic bladder 10/30/2013   Insomnia 10/11/2017   Localized edema 12/16/2015   Low back pain 09/17/2015   Lumbar paraspinal muscle spasm 04/21/2015   Major depression, recurrent ([place]) 07/14/2013   Major depressive disorder, recurrent, mild ([place]) 03/14/2017   Malaise and fatigue 04/21/2015   MCI (mild cognitive impairment) 08/18/2015   Memory difficulty 09/17/2015   Menopause 03/31/2014   Midline low back pain without sciatica 03/31/2014   Migraine    Mild intermittent extrinsic asthma 12/16/2015   Mild persistent asthma 05/13/2015   Mixed dyslipidemia 11/12/2019   Nausea vomiting and diarrhea 11/19/2020   Neck pain 09/17/2015   Neuropathy 09/22/2014   Nonintractable headache 11/02/2016   Other allergic rhinitis 05/13/2015   Palpitation 09/03/2017   Pedal edema 07/29/2019   Pelvic pain in female 02/17/2015   Personality disorder ([place]) 10/19/2012   Pharyngoesophageal dysphagia 04/21/2015   Plantar fasciitis 01/20/2016   Precordial chest pain 08/18/2015   Primary insomnia 10/11/2017   Prolapse of vaginal vault after hysterectomy 10/30/2013   Rectocele 10/30/2013   Seasonal allergies 10/15/2019   Senile purpura ([place]) 06/15/2020   Shin splint 04/21/2015   Shortness of breath 07/17/2016   Sinusitis, chronic 10/30/2013   Slow transit constipation 07/23/2014   SOB (shortness of breath) 11/19/2020   Stable angina ([facility]) 10/28/2019   Swelling of lower limb 05/17/2016   Tension-type headache, not intractable 09/17/2015   Thyroid disease    Tinnitus of both ears  10/11/2017   Unstable angina ([place]) 07/29/2019   Urethral stricture 10/30/2013   Urinary frequency 02/23/2021   Urinary incontinence    Urinary incontinence without sensory awareness 07/17/2016   Varicose veins of both lower extremities 04/24/2016   Venous  insufficiency (chronic) (peripheral) 06/06/2016    BP (!) 140/80 (BP Location: Left Arm, Patient Position: Sitting, Cuff Size: Normal)   Pulse (!) 59   Temp 98 F (36.7 C) (Oral)   Ht '5\' 5"'$  (1.651 m)   Wt 142 lb (64.4 kg)   SpO2 99%   BMI 23.63 kg/m  Gen: awake, alert, appearing stated age Lungs: No accessory muscle use Skin: pinkish cluster of bumps over R CVA near R 8. No drainage, erythema, TTP, fluctuance, excoriation Psych: Age appropriate judgment and insight  Burning pain - Plan: venlafaxine XR (EFFEXOR-XR) 37.5 MG 24 hr capsule  She will finish the Valtrex. Could be shingles? Not obvious and pain is reported b/l. Start Effexor daily to help w neuropathic pain. Tylenol, ice.  Refer GI for CCS.  F/u prn. The patient voiced understanding and agreement to the plan.  [place], DO 02/20/22 11:40 AM

## 2022-02-20 NOTE — Patient Instructions (Addendum)
Finish the Valtrex.  If you do not hear anything about your referral in the next 1-2 weeks, call our office and ask for an update.  Ice/cold pack over area for 10-15 min twice daily.  Try 2 tablespoons of milk of mag in 4 oz of warm prune juice. Do that and wait a couple hours. If no improvement, try a Dulcolax suppository and then let me know if we are still having issues.   Take some Pepcid/famotidine 20 mg twice daily as needed for reflux.   Let us know if you need anything.

## 2022-03-16 DIAGNOSIS — F329 Major depressive disorder, single episode, unspecified: Secondary | ICD-10-CM | POA: Diagnosis not present

## 2022-04-21 ENCOUNTER — Telehealth: Payer: Self-pay | Admitting: Pharmacist

## 2022-04-21 NOTE — Telephone Encounter (Signed)
Patient noted to have CAD but unable to take statin. She has tried atorvastatin, pravastatin and rosuvastatin. Experienced myalgias with all 3 statins.  Patient requested follow up for labs with PCP at end of December 2023. Made appt for 05/10/2022. Consider using exclusion code M79.1.

## 2022-04-25 ENCOUNTER — Telehealth (INDEPENDENT_AMBULATORY_CARE_PROVIDER_SITE_OTHER): Payer: PPO | Admitting: Family Medicine

## 2022-04-25 ENCOUNTER — Encounter: Payer: Self-pay | Admitting: Family Medicine

## 2022-04-25 DIAGNOSIS — J069 Acute upper respiratory infection, unspecified: Secondary | ICD-10-CM

## 2022-04-25 DIAGNOSIS — J45901 Unspecified asthma with (acute) exacerbation: Secondary | ICD-10-CM | POA: Diagnosis not present

## 2022-04-25 MED ORDER — PREDNISONE 20 MG PO TABS: 40.0000 mg | ORAL_TABLET | Freq: Every day | ORAL | 0 refills | Status: AC

## 2022-04-25 NOTE — Progress Notes (Signed)
Chief Complaint  Patient presents with   Nasal Congestion   Cough    Jenna Campbell here for URI complaints. Due to COVID-19 pandemic, we are interacting via web portal for an electronic face-to-face visit. I verified patient's ID using 2 identifiers. Patient agreed to proceed with visit via this method. Patient is at home, I am at office. Patient and I are present for visit.   Duration: 2 days  Associated symptoms: sinus congestion, rhinorrhea, sore throat, , some wheezing, SOB, coughing Denies: sinus pain, itchy watery eyes, ear pain, ear drainage, fevers, N/V/D Treatment to date: Zn, inhaler, OTC cold/cough meds Sick contacts: Yes; sick ppl at ALF Tested neg for covid x 2.   Past Medical History:  Diagnosis Date   Acute bronchitis due to other specified organisms 04/21/2015   Acute hyponatremia 11/19/2020   Acute tension-type headache 12/16/2015   Adjustment disorder with anxious mood 04/21/2015   Allergic asthma with acute exacerbation 09/22/2014   Allergic rhinitis due to pollen 07/23/2014   Allergic urticaria 05/13/2015   Allergy    Alopecia 10/30/2013   Angina pectoris ([place]) 07/29/2019   Asthma    Atrophic vaginitis 10/30/2013   Basal cell carcinoma 04/21/2015   Bilateral impacted cerumen 04/21/2015   CAD (coronary artery disease) 10/28/2019   Cervical radiculopathy 09/17/2015   Change in bowel function 06/14/2016   Chest pain in adult 05/28/2017   Chest tightness 07/29/2019   Chronic diarrhea 12/16/2015   Colon polyps    Cough 02/14/2016   COVID-19 virus infection 11/19/2020   Cystocele, midline 10/30/2013   Dermatitis 09/22/2014   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   Diverticulosis of large intestine 10/30/2013   Dupuytren's contracture of both hands 09/17/2019   Dyspareunia 07/23/2014   Dysuria 12/16/2015   Encounter for long-term (current) use of other medications 10/19/2012   Esophageal dysphagia 06/14/2016   Essential hypertension    Family history of  pheochromocytoma 02/14/2016   Foot pain, bilateral 01/05/2018   Gastroesophageal reflux disease    Gastroesophageal reflux disease without esophagitis 05/13/2015   Generalized abdominal pain 12/16/2015   GERD (gastroesophageal reflux disease)    H/O: hysterectomy    Herpes simplex 08/17/2016   History of adenomatous polyp of colon 06/14/2016   History of blood transfusion 1970   Hx of migraines 05/15/2009   Hyperlipemia 12/10/2020   Hypothyroidism 10/30/2013   Hypotonic bladder 10/30/2013   Insomnia 10/11/2017   Localized edema 12/16/2015   Low back pain 09/17/2015   Lumbar paraspinal muscle spasm 04/21/2015   Major depression, recurrent ([place]) 07/14/2013   Major depressive disorder, recurrent, mild ([place]) 03/14/2017   Malaise and fatigue 04/21/2015   MCI (mild cognitive impairment) 08/18/2015   Memory difficulty 09/17/2015   Menopause 03/31/2014   Midline low back pain without sciatica 03/31/2014   Migraine    Mild intermittent extrinsic asthma 12/16/2015   Mild persistent asthma 05/13/2015   Mixed dyslipidemia 11/12/2019   Nausea vomiting and diarrhea 11/19/2020   Neck pain 09/17/2015   Neuropathy 09/22/2014   Nonintractable headache 11/02/2016   Other allergic rhinitis 05/13/2015   Palpitation 09/03/2017   Pedal edema 07/29/2019   Pelvic pain in female 02/17/2015   Personality disorder ([place]) 10/19/2012   Pharyngoesophageal dysphagia 04/21/2015   Plantar fasciitis 01/20/2016   Precordial chest pain 08/18/2015   Primary insomnia 10/11/2017   Prolapse of vaginal vault after hysterectomy 10/30/2013   Rectocele 10/30/2013   Seasonal allergies 10/15/2019   Senile purpura ([place]) 06/15/2020   Shin splint 04/21/2015   Shortness  of breath 07/17/2016   Sinusitis, chronic 10/30/2013   Slow transit constipation 07/23/2014   SOB (shortness of breath) 11/19/2020   Stable angina 10/28/2019   Swelling of lower limb 05/17/2016   Tension-type headache, not intractable 09/17/2015    Thyroid disease    Tinnitus of both ears 10/11/2017   Unstable angina ([place]) 07/29/2019   Urethral stricture 10/30/2013   Urinary frequency 02/23/2021   Urinary incontinence    Urinary incontinence without sensory awareness 07/17/2016   Varicose veins of both lower extremities 04/24/2016   Venous insufficiency (chronic) (peripheral) 06/06/2016    Objective No conversational dyspnea Age appropriate judgment and insight Nml affect and mood  Extrinsic asthma with acute exacerbation, unspecified asthma severity, unspecified whether persistent  Viral URI with cough  Exacerbation of chronic issue. 40 mg/d pred burst for 5 d. Continue to push fluids, practice good hand hygiene, cover mouth when coughing. F/u prn. If starting to experience fevers, shaking, or shortness of breath, seek immediate care. Pt voiced understanding and agreement to the plan.  [place], DO 04/25/22 2:21 PM

## 2022-04-28 ENCOUNTER — Telehealth: Payer: Self-pay | Admitting: Family Medicine

## 2022-04-28 MED ORDER — AZITHROMYCIN 250 MG PO TABS
ORAL_TABLET | ORAL | 0 refills | Status: DC
Start: 2022-04-28 — End: 2022-05-10

## 2022-04-28 NOTE — Telephone Encounter (Signed)
Will send another medicine. Ty.

## 2022-04-28 NOTE — Telephone Encounter (Signed)
Patient informed. 

## 2022-04-28 NOTE — Telephone Encounter (Signed)
Patient called to advise Dr. Nani Ravens that she has about 3 days left of the prednisone, but she still has a dry coughing spell, her ears itching and she is still stopped up and when she blows her nose there is blood in it. She still has the sore throat. She said she doesn't feel as tired but she thinks it may be more than allergies. Please call to advise what to do

## 2022-05-03 DIAGNOSIS — F4322 Adjustment disorder with anxiety: Secondary | ICD-10-CM | POA: Diagnosis not present

## 2022-05-03 DIAGNOSIS — F330 Major depressive disorder, recurrent, mild: Secondary | ICD-10-CM | POA: Diagnosis not present

## 2022-05-03 DIAGNOSIS — F411 Generalized anxiety disorder: Secondary | ICD-10-CM | POA: Diagnosis not present

## 2022-05-03 DIAGNOSIS — F5101 Primary insomnia: Secondary | ICD-10-CM | POA: Diagnosis not present

## 2022-05-03 DIAGNOSIS — F609 Personality disorder, unspecified: Secondary | ICD-10-CM | POA: Diagnosis not present

## 2022-05-05 DIAGNOSIS — M542 Cervicalgia: Secondary | ICD-10-CM | POA: Diagnosis not present

## 2022-05-05 DIAGNOSIS — R519 Headache, unspecified: Secondary | ICD-10-CM | POA: Diagnosis not present

## 2022-05-05 DIAGNOSIS — M545 Low back pain, unspecified: Secondary | ICD-10-CM | POA: Diagnosis not present

## 2022-05-09 NOTE — Progress Notes (Signed)
[facility]) [place] Team Statin Quality Measure Assessment  05/09/2022  Jojo Geving 08-17-1950 [id number]  Per review of chart and payor information, patient has a diagnosis of diabetes and cardiovascular disease but is not currently filling a statin prescription.  This places patient into the Statin Use in Patients with Cardiovascular Disease [facility]) measures for CMS.    Patient has documented trials of pitavastatin, atorvastatin, and rosuvastatin with reported myalgias, but no corresponding CPT codes that would exclude patient from SUPD and SPC measures. Next appointment 05/10/2022.      Component Value Date/Time   CHOL 140 05/13/2021 0941   CHOL 164 12/13/2020 0953   TRIG 159.0 (H) 05/13/2021 0941   HDL 35.90 (L) 05/13/2021 0941   HDL 39 (L) 12/13/2020 0953   CHOLHDL 4 05/13/2021 0941   VLDL 31.8 05/13/2021 0941   LDLCALC 73 05/13/2021 0941   LDLCALC 88 12/13/2020 0953    Please consider ONE of the following recommendations:  Initiate high intensity statin Atorvastatin 40 mg once daily, #90, 3 refills   Rosuvastatin 20 mg once daily, #90, 3 refills    Initiate moderate intensity  statin with reduced frequency if prior  statin intolerance 1x weekly, #13, 3 refills   2x weekly, #26, 3 refills   3x weekly, #39, 3 refills    Code for past statin intolerance  (required annually)  Provider Requirements: Must associate code during an office visit or telehealth encounter   Drug Induced Myopathy G72.0   Myositis, unspecified M60.9   Myopathy, unspecified G72.9   Rhabdomyolysis  M62.82   Myalgia (SPC ONLY) R83.0   Alcoholic cirrhosis of liver without ascites N40.76   Alcoholic cirrhosis of liver with ascites K70.31   Unspecified cirrhosis of liver K74.60   Toxic liver disease with fibrosis and cirrhosis of liver K71.7   Thank you for allowing [facility] pharmacy to be a part of this patient's care.   Kristeen Miss, PharmD Clinical  Pharmacist [place] Cell: [phone]

## 2022-05-10 ENCOUNTER — Encounter: Payer: Self-pay | Admitting: Family Medicine

## 2022-05-10 ENCOUNTER — Ambulatory Visit (INDEPENDENT_AMBULATORY_CARE_PROVIDER_SITE_OTHER): Payer: PPO | Admitting: Family Medicine

## 2022-05-10 VITALS — BP 110/80 | HR 61 | Temp 97.5°F | Resp 18 | Ht 65.0 in | Wt 135.2 lb

## 2022-05-10 DIAGNOSIS — J45901 Unspecified asthma with (acute) exacerbation: Secondary | ICD-10-CM

## 2022-05-10 DIAGNOSIS — M7989 Other specified soft tissue disorders: Secondary | ICD-10-CM

## 2022-05-10 DIAGNOSIS — R52 Pain, unspecified: Secondary | ICD-10-CM | POA: Diagnosis not present

## 2022-05-10 DIAGNOSIS — G720 Drug-induced myopathy: Secondary | ICD-10-CM | POA: Diagnosis not present

## 2022-05-10 DIAGNOSIS — J4540 Moderate persistent asthma, uncomplicated: Secondary | ICD-10-CM | POA: Diagnosis not present

## 2022-05-10 DIAGNOSIS — T466X5A Adverse effect of antihyperlipidemic and antiarteriosclerotic drugs, initial encounter: Secondary | ICD-10-CM

## 2022-05-10 MED ORDER — FLUTICASONE-SALMETEROL 250-50 MCG/ACT IN AEPB: INHALATION_SPRAY | RESPIRATORY_TRACT | 2 refills | Status: DC

## 2022-05-10 MED ORDER — VENLAFAXINE HCL ER 75 MG PO CP24: 75.0000 mg | ORAL_CAPSULE | Freq: Every day | ORAL | 1 refills | Status: DC

## 2022-05-10 MED ORDER — POTASSIUM CHLORIDE ER 10 MEQ PO TBCR: EXTENDED_RELEASE_TABLET | ORAL | 1 refills | Status: DC

## 2022-05-10 MED ORDER — METHYLPREDNISOLONE 4 MG PO TBPK: ORAL_TABLET | ORAL | 0 refills | Status: DC

## 2022-05-10 MED ORDER — ALBUTEROL SULFATE HFA 108 (90 BASE) MCG/ACT IN AERS: 2.0000 | INHALATION_SPRAY | RESPIRATORY_TRACT | 1 refills | Status: DC | PRN

## 2022-05-10 MED ORDER — FUROSEMIDE 40 MG PO TABS: 40.0000 mg | ORAL_TABLET | Freq: Every day | ORAL | 1 refills | Status: AC

## 2022-05-10 NOTE — Progress Notes (Signed)
Chief Complaint  Patient presents with   Nerve pain   Follow-up    Subjective: Patient is a 71 y.o. female here for f/u neuropathy.  Started on Effexor XR 37.5 mg/d for L sided chest wall pain that is described as burning. Symptoms come and go. S/s's did not help.   Over the past week, the assisted living facility where she resides had accidentally locked the trash dispensary over the holiday. The buildup of trash flared up her breathing. She is compliant with the Advair. No AE's. +nasaon congestion since this started. Otherwise, no URI s/s's, fevers, sick contacts.   Past Medical History:  Diagnosis Date   Acute bronchitis due to other specified organisms 04/21/2015   Acute hyponatremia 11/19/2020   Acute tension-type headache 12/16/2015   Adjustment disorder with anxious mood 04/21/2015   Allergic asthma with acute exacerbation 09/22/2014   Allergic rhinitis due to pollen 07/23/2014   Allergic urticaria 05/13/2015   Allergy    Alopecia 10/30/2013   Angina pectoris ([place]) 07/29/2019   Asthma    Atrophic vaginitis 10/30/2013   Basal cell carcinoma 04/21/2015   Bilateral impacted cerumen 04/21/2015   CAD (coronary artery disease) 10/28/2019   Cervical radiculopathy 09/17/2015   Change in bowel function 06/14/2016   Chest pain in adult 05/28/2017   Chest tightness 07/29/2019   Chronic diarrhea 12/16/2015   Colon polyps    Cough 02/14/2016   COVID-19 virus infection 11/19/2020   Cystocele, midline 10/30/2013   Dermatitis 09/22/2014   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   Diverticulosis of large intestine 10/30/2013   Dupuytren's contracture of both hands 09/17/2019   Dyspareunia 07/23/2014   Dysuria 12/16/2015   Encounter for long-term (current) use of other medications 10/19/2012   Esophageal dysphagia 06/14/2016   Essential hypertension    Family history of pheochromocytoma 02/14/2016   Foot pain, bilateral 01/05/2018   Gastroesophageal reflux disease    Gastroesophageal  reflux disease without esophagitis 05/13/2015   Generalized abdominal pain 12/16/2015   GERD (gastroesophageal reflux disease)    H/O: hysterectomy    Herpes simplex 08/17/2016   History of adenomatous polyp of colon 06/14/2016   History of blood transfusion 1970   Hx of migraines 05/15/2009   Hyperlipemia 12/10/2020   Hypothyroidism 10/30/2013   Hypotonic bladder 10/30/2013   Insomnia 10/11/2017   Localized edema 12/16/2015   Low back pain 09/17/2015   Lumbar paraspinal muscle spasm 04/21/2015   Major depression, recurrent ([place]) 07/14/2013   Major depressive disorder, recurrent, mild ([place]) 03/14/2017   Malaise and fatigue 04/21/2015   MCI (mild cognitive impairment) 08/18/2015   Memory difficulty 09/17/2015   Menopause 03/31/2014   Midline low back pain without sciatica 03/31/2014   Migraine    Mild intermittent extrinsic asthma 12/16/2015   Mild persistent asthma 05/13/2015   Mixed dyslipidemia 11/12/2019   Nausea vomiting and diarrhea 11/19/2020   Neck pain 09/17/2015   Neuropathy 09/22/2014   Nonintractable headache 11/02/2016   Other allergic rhinitis 05/13/2015   Palpitation 09/03/2017   Pedal edema 07/29/2019   Pelvic pain in female 02/17/2015   Personality disorder ([place]) 10/19/2012   Pharyngoesophageal dysphagia 04/21/2015   Plantar fasciitis 01/20/2016   Precordial chest pain 08/18/2015   Primary insomnia 10/11/2017   Prolapse of vaginal vault after hysterectomy 10/30/2013   Rectocele 10/30/2013   Seasonal allergies 10/15/2019   Senile purpura ([place]) 06/15/2020   Shin splint 04/21/2015   Shortness of breath 07/17/2016   Sinusitis, chronic 10/30/2013   Slow transit constipation 07/23/2014  SOB (shortness of breath) 11/19/2020   Stable angina 10/28/2019   Swelling of lower limb 05/17/2016   Tension-type headache, not intractable 09/17/2015   Thyroid disease    Tinnitus of both ears 10/11/2017   Unstable angina ([place]) 07/29/2019   Urethral stricture  10/30/2013   Urinary frequency 02/23/2021   Urinary incontinence    Urinary incontinence without sensory awareness 07/17/2016   Varicose veins of both lower extremities 04/24/2016   Venous insufficiency (chronic) (peripheral) 06/06/2016    Objective: BP 110/80 (BP Location: Right Arm, Patient Position: Sitting, Cuff Size: Normal)   Pulse 61   Temp (!) 97.5 F (36.4 C) (Oral)   Resp 18   Ht '5\' 5"'$  (1.651 m)   Wt 135 lb 3.2 oz (61.3 kg)   SpO2 98%   BMI 22.50 kg/m  General: Awake, appears stated age Heart: RRR, no LE edema Lungs: CTAB, no rales, wheezes or rhonchi. No accessory muscle use Psych: Age appropriate judgment and insight, normal affect and mood  Assessment and Plan: Burning pain - Plan: venlafaxine XR (EFFEXOR XR) 75 MG 24 hr capsule  Extrinsic asthma with acute exacerbation, unspecified asthma severity, unspecified whether persistent - Plan: methylPREDNISolone (MEDROL DOSEPAK) 4 MG TBPK tablet  Localized swelling of both lower extremities - Plan: furosemide (LASIX) 40 MG tablet, potassium chloride (KLOR-CON) 10 MEQ tablet  Moderate persistent asthma without complication - Plan: albuterol (PROAIR HFA) 108 (90 Base) MCG/ACT inhaler, fluticasone-salmeterol (ADVAIR DISKUS) 250-50 MCG/ACT AEPB  Statin myopathy  Chronic, uncontrolled. Increase Effexor XR 75 mg/d. F/u in 1 mo. Exacerbation of chronic issue. Medrol Dosepak. Cont Advair. F/u in 1 mo. The patient voiced understanding and agreement to the plan.  [place], DO 05/10/22  1:43 PM

## 2022-05-10 NOTE — Patient Instructions (Addendum)
Continue Advair.  Use albuterol as needed.  Let me know if there are cost issues.  Let us know if you need anything.

## 2022-05-14 ENCOUNTER — Encounter: Payer: Self-pay | Admitting: Family Medicine

## 2022-05-14 ENCOUNTER — Telehealth: Payer: PPO | Admitting: Urgent Care

## 2022-05-14 DIAGNOSIS — J0100 Acute maxillary sinusitis, unspecified: Secondary | ICD-10-CM | POA: Diagnosis not present

## 2022-05-14 DIAGNOSIS — J4530 Mild persistent asthma, uncomplicated: Secondary | ICD-10-CM

## 2022-05-14 MED ORDER — DOXYCYCLINE HYCLATE 100 MG PO TABS: 100.0000 mg | ORAL_TABLET | Freq: Two times a day (BID) | ORAL | 0 refills | Status: AC

## 2022-05-14 MED ORDER — IPRATROPIUM-ALBUTEROL 0.5-2.5 (3) MG/3ML IN SOLN: 3.0000 mL | RESPIRATORY_TRACT | 0 refills | Status: DC | PRN

## 2022-05-14 NOTE — Progress Notes (Signed)
Virtual Visit Consent   Jenna Campbell, you are scheduled for a virtual visit with a [facility] provider today. Just as with appointments in the office, your consent must be obtained to participate. Your consent will be active for this visit and any virtual visit you may have with one of our providers in the next 365 days. If you have a MyChart account, a copy of this consent can be sent to you electronically.  As this is a virtual visit, video technology does not allow for your provider to perform a traditional examination. This may limit your provider's ability to fully assess your condition. If your provider identifies any concerns that need to be evaluated in person or the need to arrange testing (such as labs, EKG, etc.), we will make arrangements to do so. Although advances in technology are sophisticated, we cannot ensure that it will always work on either your end or our end. If the connection with a video visit is poor, the visit may have to be switched to a telephone visit. With either a video or telephone visit, we are not always able to ensure that we have a secure connection.  By engaging in this virtual visit, you consent to the provision of healthcare and authorize for your insurance to be billed (if applicable) for the services provided during this visit. Depending on your insurance coverage, you may receive a charge related to this service.  I need to obtain your verbal consent now. Are you willing to proceed with your visit today? Jenna Campbell has provided verbal consent on 05/14/2022 for a virtual visit (video or telephone). Chaney Malling, PA  Date: 05/14/2022 11:23 AM  Virtual Visit via Video Note   I, Brian Head, connected with  Jenna Campbell  ([id number], [birth year]) on 05/14/22 at 10:45 AM EST by a video-enabled telemedicine application and verified that I am speaking with the correct person using two identifiers.  Location: Patient: Virtual Visit Location Patient:  [place] Provider: Virtual Visit Location Provider: Home Office   I discussed the limitations of evaluation and management by telemedicine and the availability of in person appointments. The patient expressed understanding and agreed to proceed.    History of Present Illness: Jenna Campbell is a 71 y.o. who identifies as a female who was assigned female at birth, and is being seen today for lingering cough.  HPI: 71yo female presents today with concern of lingering cough. She was seen by her PCP originally on 04/25/22, prescribed prednisone for suspected asthma exacerbation. She was then seen again for continued cough and congestion by PCP on 05/10/22. She was prescribed a medrol dose pack. Pt did not pick it up immediately, and is currently on day two of the Medrol pack. Pt takes Advair daily. She also has been using her inhaler. She feels that her cough will not go away. Additionally, she now has sinus pain and congestion, thick green mucous, and post nasal drainage. No nausea, no fever. She has been taking OTC mucinex without relief. Denies SOB or CP. Has numerous abx allergies but tolerates doxy.   Problems:  Patient Active Problem List   Diagnosis Date Noted   Arthritis of carpometacarpal [facility]) joint of left thumb 01/10/2022   Neutropenia, unspecified type ([place]) 07/07/2021   Urinary frequency 02/23/2021   Hyperlipemia 12/10/2020   Nausea vomiting and diarrhea 11/19/2020   COVID-19 virus infection 11/19/2020   SOB (shortness of breath) 11/19/2020   Acute hyponatremia 11/19/2020   Senile purpura ([place]) 06/15/2020  GERD (gastroesophageal reflux disease)    Mixed dyslipidemia 11/12/2019   CAD (coronary artery disease) 10/28/2019   Stable angina 10/28/2019   Seasonal allergies 10/15/2019   Dupuytren's contracture of both hands 09/17/2019   Chest tightness 07/29/2019   Unstable angina ([facility]) 07/29/2019   Pedal edema 07/29/2019   Angina pectoris ([place]) 07/29/2019   Foot pain,  bilateral 01/05/2018   Tinnitus of both ears 10/11/2017   Primary insomnia 10/11/2017   Insomnia 10/11/2017   Palpitation 09/03/2017   Chest pain in adult 05/28/2017   Major depressive disorder, recurrent, mild ([place]) 03/14/2017   Allergy    Asthma    Colon polyps    Essential hypertension    Gastroesophageal reflux disease    H/O: hysterectomy    Thyroid disease    Urinary incontinence    Nonintractable headache 11/02/2016   Herpes simplex 08/17/2016   Shortness of breath 07/17/2016   Urinary incontinence without sensory awareness 07/17/2016   Change in bowel function 06/14/2016   Esophageal dysphagia 06/14/2016   History of adenomatous polyp of colon 06/14/2016   Venous insufficiency (chronic) (peripheral) 06/06/2016   Swelling of lower limb 05/17/2016   Varicose veins of both lower extremities 04/24/2016   Cough 02/14/2016   Family history of pheochromocytoma 02/14/2016   Plantar fasciitis 01/20/2016   Acute tension-type headache 12/16/2015   Chronic diarrhea 12/16/2015   Dysuria 12/16/2015   Generalized abdominal pain 12/16/2015   Localized edema 12/16/2015   Mild intermittent extrinsic asthma 12/16/2015   Cervical radiculopathy 09/17/2015   Neck pain 09/17/2015   Low back pain 09/17/2015   Memory difficulty 09/17/2015   Tension-type headache, not intractable 09/17/2015   MCI (mild cognitive impairment) 08/18/2015   Precordial chest pain 08/18/2015   Mild persistent asthma 05/13/2015   Other allergic rhinitis 05/13/2015   Gastroesophageal reflux disease without esophagitis 05/13/2015   Allergic urticaria 05/13/2015   Acute bronchitis due to other specified organisms 04/21/2015   Adjustment disorder with anxious mood 04/21/2015   Basal cell carcinoma 04/21/2015   Bilateral impacted cerumen 04/21/2015   Lumbar paraspinal muscle spasm 04/21/2015   Malaise and fatigue 04/21/2015   Pharyngoesophageal dysphagia 04/21/2015   Shin splint 04/21/2015   Pelvic pain in  female 02/17/2015   Allergic asthma with acute exacerbation 09/22/2014   Dermatitis 09/22/2014   Neuropathy 09/22/2014   Allergic rhinitis due to pollen 07/23/2014   Dyspareunia 07/23/2014   Slow transit constipation 07/23/2014   Menopause 03/31/2014   Midline low back pain without sciatica 03/31/2014   Alopecia 10/30/2013   Cystocele, midline 10/30/2013   Diverticulosis of large intestine 10/30/2013   Hypothyroidism 10/30/2013   Hypotonic bladder 10/30/2013   Atrophic vaginitis 10/30/2013   Prolapse of vaginal vault after hysterectomy 10/30/2013   Rectocele 10/30/2013   Sinusitis, chronic 10/30/2013   Urethral stricture 10/30/2013   Major depression, recurrent ([place]) 07/14/2013   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   Encounter for long-term (current) use of other medications 10/19/2012   Migraine 10/19/2012   Personality disorder ([place]) 10/19/2012   Hx of migraines 05/15/2009   History of blood transfusion 05/15/1968    Allergies:  Allergies  Allergen Reactions   Iodinated Contrast Media Itching, Other (See Comments), Rash and Shortness Of Breath    Throat Swelling, Erythema   Kenalog [Triamcinolone Acetonide] Swelling   Metrizamide Itching, Other (See Comments), Rash and Shortness Of Breath    Throat Swelling, Erythema   Other Itching and Swelling    DUST, spicy food, red sause   Penicillins Anaphylaxis  and Itching   Crestor [Rosuvastatin] Other (See Comments)    myalgia   Lipitor [Atorvastatin] Other (See Comments)    myalgia   Montelukast Sodium Other (See Comments)    Chest pain, dizziness and lightheadedness   Pitavastatin    Xyzal [Levocetirizine]     Chest pressure   Ciprofloxacin Itching   Diazepam Rash   Latex Itching, Rash and Swelling   Sulfa Antibiotics Rash   Medications:  Current Outpatient Medications:    doxycycline (VIBRA-TABS) 100 MG tablet, Take 1 tablet (100 mg total) by mouth 2 (two) times daily for 10 days., Disp: 20 tablet, Rfl: 0    ipratropium-albuterol (DUONEB) 0.5-2.5 (3) MG/3ML SOLN, Take 3 mLs by nebulization every 4 (four) hours as needed., Disp: 75 mL, Rfl: 0   acetaminophen (TYLENOL) 500 MG tablet, Take 500 mg by mouth every 6 (six) hours as needed for moderate pain., Disp: , Rfl:    albuterol (PROAIR HFA) 108 (90 Base) MCG/ACT inhaler, Inhale 2 puffs into the lungs every 4 (four) hours as needed for wheezing or shortness of breath., Disp: 18 g, Rfl: 1   ASPIRIN 81 PO, Take 81 mg by mouth daily. , Disp: , Rfl:    azelastine (ASTELIN) 0.1 % nasal spray, Place 2 sprays into both nostrils 2 (two) times daily. Use in each nostril as directed, Disp: 30 mL, Rfl: 12   betamethasone valerate (VALISONE) 0.1 % cream, APPLY TO AFFECTED AREA TWICE A DAY, Disp: 45 g, Rfl: 3   Calcium-Phosphorus-Vitamin D (CALCIUM GUMMIES PO), Take by mouth., Disp: , Rfl:    cholecalciferol (VITAMIN D3) 25 MCG (1000 UNIT) tablet, Take 1,000 Units by mouth daily., Disp: , Rfl:    cholestyramine (QUESTRAN) 4 GM/DOSE powder, Take 1 packet (4 g total) by mouth 3 (three) times daily with meals., Disp: 378 g, Rfl: 12   Coenzyme Q10 (CO Q-10) 200 MG CAPS, Take 200 mg by mouth daily., Disp: 90 capsule, Rfl: 3   fluticasone (FLONASE) 50 MCG/ACT nasal spray, Place 2 sprays into both nostrils as needed for rhinitis or allergies., Disp: , Rfl:    fluticasone-salmeterol (ADVAIR DISKUS) 250-50 MCG/ACT AEPB, TAKE 1 PUFF BY MOUTH TWICE A DAY, Disp: 60 each, Rfl: 2   furosemide (LASIX) 40 MG tablet, Take 1 tablet (40 mg total) by mouth daily., Disp: 90 tablet, Rfl: 1   gabapentin (NEURONTIN) 300 MG capsule, Take 300 mg by mouth in the morning., Disp: , Rfl:    HYDROcodone-acetaminophen (NORCO/VICODIN) 5-325 MG tablet, Take 1 tablet by mouth every 8 (eight) hours as needed., Disp: 10 tablet, Rfl: 0   ibuprofen (ADVIL) 200 MG tablet, Take 400 mg by mouth every 6 (six) hours as needed for mild pain., Disp: , Rfl:    isosorbide mononitrate (IMDUR) 30 MG 24 hr tablet, TAKE  1/2 TABLET BY MOUTH DAILY, Disp: 45 tablet, Rfl: 3   levocetirizine (XYZAL) 5 MG tablet, Take 5 mg by mouth daily., Disp: , Rfl:    levothyroxine (SYNTHROID) 150 MCG tablet, TAKE 1.5 TABLETS (225 MCG TOTAL) BY MOUTH DAILY BEFORE BREAKFAST., Disp: 135 tablet, Rfl: 2   LORazepam (ATIVAN) 0.5 MG tablet, Take 0.5 mg by mouth every 8 (eight) hours as needed for anxiety. , Disp: , Rfl:    methylPREDNISolone (MEDROL DOSEPAK) 4 MG TBPK tablet, Follow instructions on package., Disp: 21 tablet, Rfl: 0   nitroGLYCERIN (NITROSTAT) 0.4 MG SL tablet, Place 1 tablet (0.4 mg total) under the tongue every 5 (five) minutes as needed for chest pain.,  Disp: 25 tablet, Rfl: 10   olopatadine (PATANOL) 0.1 % ophthalmic solution, Place 1 drop into both eyes 2 (two) times daily., Disp: 5 mL, Rfl: 1   pantoprazole (PROTONIX) 40 MG tablet, Take 1 tablet (40 mg total) by mouth 2 (two) times daily., Disp: 60 tablet, Rfl: 3   potassium chloride (KLOR-CON) 10 MEQ tablet, TAKE 2 TABLETS WITH EVERY DOSE OF LASIX 40 MG., Disp: 180 tablet, Rfl: 1   temazepam (RESTORIL) 15 MG capsule, Take 15 mg by mouth at bedtime. , Disp: , Rfl:    topiramate (TOPAMAX) 100 MG tablet, Take 200 mg by mouth at bedtime., Disp: , Rfl:    valACYclovir (VALTREX) 1000 MG tablet, TAKE 1 TABLET BY MOUTH ONCE DAILY FOR 5 DAYS DURING OUTBREAKS., Disp: 90 tablet, Rfl: 0   venlafaxine XR (EFFEXOR XR) 75 MG 24 hr capsule, Take 1 capsule (75 mg total) by mouth daily with breakfast., Disp: 30 capsule, Rfl: 1  Observations/Objective: Patient is well-developed, well-nourished in no acute distress.  Resting comfortably at home.  Head is normocephalic, atraumatic.  No labored breathing. No accessory muscle usage. Throat clearing heard on exam, but no harsh coughing. Speech is clear and coherent with logical content.  Patient is alert and oriented at baseline.  Pain to personal palpation of sinuses bilaterally, mild rhinorrhea  Assessment and Plan: 1. Acute  non-recurrent maxillary sinusitis  2. Mild persistent asthma without complication  Pt has failed to respond to two rounds of steroids as prescribed by PCP. Pt currently on medrol. Pt appears to be developing sinusitis. Additionally, concern for possible bacterial pathogen given her cough for >3 weeks. Will start doxy to cover for atypicals and sinuses. Pt to continue her medrol. Pt has nebulizer machine, but no solution. Will have pt stop handheld inhaler and switch to duonebs every 4-6 hours around the clock for the next 24-48 hours.   Follow Up Instructions: I discussed the assessment and treatment plan with the patient. The patient was provided an opportunity to ask questions and all were answered. The patient agreed with the plan and demonstrated an understanding of the instructions.  A copy of instructions were sent to the patient via MyChart unless otherwise noted below.    The patient was advised to call back or seek an in-person evaluation if the symptoms worsen or if the condition fails to improve as anticipated.  Time:  I spent 10 minutes with the patient via telehealth technology discussing the above problems/concerns.    [place], PA

## 2022-05-14 NOTE — Patient Instructions (Signed)
Jenna Campbell, thank you for joining Chaney Malling, PA for today's virtual visit.  While this provider is not your primary care provider (PCP), if your PCP is located in our provider database this encounter information will be shared with them immediately following your visit.   [name] account gives you access to today's visit and all your visits, tests, and labs performed at [facility] " click here if you don't have a [facility] account or go to [email or web address]  Consent: (Patient) Jenna Campbell provided verbal consent for this virtual visit at the beginning of the encounter.  Current Medications:  Current Outpatient Medications:    doxycycline (VIBRA-TABS) 100 MG tablet, Take 1 tablet (100 mg total) by mouth 2 (two) times daily for 10 days., Disp: 20 tablet, Rfl: 0   ipratropium-albuterol (DUONEB) 0.5-2.5 (3) MG/3ML SOLN, Take 3 mLs by nebulization every 4 (four) hours as needed., Disp: 75 mL, Rfl: 0   acetaminophen (TYLENOL) 500 MG tablet, Take 500 mg by mouth every 6 (six) hours as needed for moderate pain., Disp: , Rfl:    albuterol (PROAIR HFA) 108 (90 Base) MCG/ACT inhaler, Inhale 2 puffs into the lungs every 4 (four) hours as needed for wheezing or shortness of breath., Disp: 18 g, Rfl: 1   ASPIRIN 81 PO, Take 81 mg by mouth daily. , Disp: , Rfl:    azelastine (ASTELIN) 0.1 % nasal spray, Place 2 sprays into both nostrils 2 (two) times daily. Use in each nostril as directed, Disp: 30 mL, Rfl: 12   betamethasone valerate (VALISONE) 0.1 % cream, APPLY TO AFFECTED AREA TWICE A DAY, Disp: 45 g, Rfl: 3   Calcium-Phosphorus-Vitamin D (CALCIUM GUMMIES PO), Take by mouth., Disp: , Rfl:    cholecalciferol (VITAMIN D3) 25 MCG (1000 UNIT) tablet, Take 1,000 Units by mouth daily., Disp: , Rfl:    cholestyramine (QUESTRAN) 4 GM/DOSE powder, Take 1 packet (4 g total) by mouth 3 (three) times daily with meals., Disp: 378 g, Rfl: 12   Coenzyme Q10 (CO  Q-10) 200 MG CAPS, Take 200 mg by mouth daily., Disp: 90 capsule, Rfl: 3   fluticasone (FLONASE) 50 MCG/ACT nasal spray, Place 2 sprays into both nostrils as needed for rhinitis or allergies., Disp: , Rfl:    fluticasone-salmeterol (ADVAIR DISKUS) 250-50 MCG/ACT AEPB, TAKE 1 PUFF BY MOUTH TWICE A DAY, Disp: 60 each, Rfl: 2   furosemide (LASIX) 40 MG tablet, Take 1 tablet (40 mg total) by mouth daily., Disp: 90 tablet, Rfl: 1   gabapentin (NEURONTIN) 300 MG capsule, Take 300 mg by mouth in the morning., Disp: , Rfl:    HYDROcodone-acetaminophen (NORCO/VICODIN) 5-325 MG tablet, Take 1 tablet by mouth every 8 (eight) hours as needed., Disp: 10 tablet, Rfl: 0   ibuprofen (ADVIL) 200 MG tablet, Take 400 mg by mouth every 6 (six) hours as needed for mild pain., Disp: , Rfl:    isosorbide mononitrate (IMDUR) 30 MG 24 hr tablet, TAKE 1/2 TABLET BY MOUTH DAILY, Disp: 45 tablet, Rfl: 3   levocetirizine (XYZAL) 5 MG tablet, Take 5 mg by mouth daily., Disp: , Rfl:    levothyroxine (SYNTHROID) 150 MCG tablet, TAKE 1.5 TABLETS (225 MCG TOTAL) BY MOUTH DAILY BEFORE BREAKFAST., Disp: 135 tablet, Rfl: 2   LORazepam (ATIVAN) 0.5 MG tablet, Take 0.5 mg by mouth every 8 (eight) hours as needed for anxiety. , Disp: , Rfl:    methylPREDNISolone (MEDROL DOSEPAK) 4 MG TBPK tablet, Follow instructions on package., Disp:  21 tablet, Rfl: 0   nitroGLYCERIN (NITROSTAT) 0.4 MG SL tablet, Place 1 tablet (0.4 mg total) under the tongue every 5 (five) minutes as needed for chest pain., Disp: 25 tablet, Rfl: 10   olopatadine (PATANOL) 0.1 % ophthalmic solution, Place 1 drop into both eyes 2 (two) times daily., Disp: 5 mL, Rfl: 1   pantoprazole (PROTONIX) 40 MG tablet, Take 1 tablet (40 mg total) by mouth 2 (two) times daily., Disp: 60 tablet, Rfl: 3   potassium chloride (KLOR-CON) 10 MEQ tablet, TAKE 2 TABLETS WITH EVERY DOSE OF LASIX 40 MG., Disp: 180 tablet, Rfl: 1   temazepam (RESTORIL) 15 MG capsule, Take 15 mg by mouth at  bedtime. , Disp: , Rfl:    topiramate (TOPAMAX) 100 MG tablet, Take 200 mg by mouth at bedtime., Disp: , Rfl:    valACYclovir (VALTREX) 1000 MG tablet, TAKE 1 TABLET BY MOUTH ONCE DAILY FOR 5 DAYS DURING OUTBREAKS., Disp: 90 tablet, Rfl: 0   venlafaxine XR (EFFEXOR XR) 75 MG 24 hr capsule, Take 1 capsule (75 mg total) by mouth daily with breakfast., Disp: 30 capsule, Rfl: 1   Medications ordered in this encounter:  Meds ordered this encounter  Medications   doxycycline (VIBRA-TABS) 100 MG tablet    Sig: Take 1 tablet (100 mg total) by mouth 2 (two) times daily for 10 days.    Dispense:  20 tablet    Refill:  0    Order Specific Question:   Supervising Provider    Answer:   Chase Picket [id number]   ipratropium-albuterol (DUONEB) 0.5-2.5 (3) MG/3ML SOLN    Sig: Take 3 mLs by nebulization every 4 (four) hours as needed.    Dispense:  75 mL    Refill:  0    Order Specific Question:   Supervising Provider    Answer:   Chase Picket [[id number]]     *If you need refills on other medications prior to your next appointment, please contact your pharmacy*  Follow-Up: Call back or seek an in-person evaluation if the symptoms worsen or if the condition fails to improve as anticipated.  [place] [facility] [phone]  Other Instructions You have a sinus/ lower respiratory tract infection. Please start taking the antibiotic, doxycycline, twice daily. Take it for all 10 days, do not stop early just because you feel better. Take an over the counter probiotic or yogurt daily to help prevent diarrhea/ yeast infection.  Continue your medrol dose pack. Use Flonase daily to help with inflammation of the nasal passage.  It is also recommended that you use nasal saline/ sinus washes to cleans the sinus passages. Hot steam from a shower or vaporizer may also be beneficial to help open up the upper airway. Eucalyptus can be helpful.  Stop your handheld inhaler and switch to nebulizer  treatments. Do this every 4-6 hours around the clock for the next 48 hours, then change to PRN  If any worsening symptoms such as headache, fever, or shortness of breath, please go to an in person evaluation/ urgent care.    If you have been instructed to have an in-person evaluation today at a local Urgent Care facility, please use the link below. It will take you to a list of all of our available [facility], including address, phone number and hours of operation. Please do not delay care.  [facility]  If you or a family member do not have a primary care provider,  use the link below to schedule a visit and establish care. When you choose a [facility] primary care physician or advanced practice provider, you gain a long-term partner in health. Find a Primary Care Provider  Learn more about [facility] in-office and virtual care options: [facility]

## 2022-06-12 ENCOUNTER — Ambulatory Visit (INDEPENDENT_AMBULATORY_CARE_PROVIDER_SITE_OTHER): Payer: PPO | Admitting: Family Medicine

## 2022-06-12 ENCOUNTER — Encounter: Payer: Self-pay | Admitting: Family Medicine

## 2022-06-12 VITALS — BP 120/68 | HR 62 | Temp 97.5°F | Ht 64.0 in | Wt 135.1 lb

## 2022-06-12 DIAGNOSIS — Z1211 Encounter for screening for malignant neoplasm of colon: Secondary | ICD-10-CM

## 2022-06-12 DIAGNOSIS — J4540 Moderate persistent asthma, uncomplicated: Secondary | ICD-10-CM | POA: Diagnosis not present

## 2022-06-12 DIAGNOSIS — R52 Pain, unspecified: Secondary | ICD-10-CM

## 2022-06-12 NOTE — Progress Notes (Signed)
Chief Complaint  Patient presents with   Follow-up    Subjective: Patient is a 72 y.o. female here for f/u.  Burning pain on L side of chest. Effexor was started and recently increased to 75 mg/d. Reports doing quite a bit better since starting. Compliant, no AE's.   He breathing had also worsened and she was put on a Medrol Dosepak. Since that time, a little better. 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 She is compliant w her Advair and   Past Medical History:  Diagnosis Date   Acute bronchitis due to other specified organisms 04/21/2015   Acute hyponatremia 11/19/2020   Acute tension-type headache 12/16/2015   Adjustment disorder with anxious mood 04/21/2015   Allergic asthma with acute exacerbation 09/22/2014   Allergic rhinitis due to pollen 07/23/2014   Allergic urticaria 05/13/2015   Allergy    Alopecia 10/30/2013   Angina pectoris ([place]) 07/29/2019   Asthma    Atrophic vaginitis 10/30/2013   Basal cell carcinoma 04/21/2015   Bilateral impacted cerumen 04/21/2015   CAD (coronary artery disease) 10/28/2019   Cervical radiculopathy 09/17/2015   Change in bowel function 06/14/2016   Chest pain in adult 05/28/2017   Chest tightness 07/29/2019   Chronic diarrhea 12/16/2015   Colon polyps    Cough 02/14/2016   COVID-19 virus infection 11/19/2020   Cystocele, midline 10/30/2013   Dermatitis 09/22/2014   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   Diverticulosis of large intestine 10/30/2013   Dupuytren's contracture of both hands 09/17/2019   Dyspareunia 07/23/2014   Dysuria 12/16/2015   Encounter for long-term (current) use of other medications 10/19/2012   Esophageal dysphagia 06/14/2016   Essential hypertension    Family history of pheochromocytoma 02/14/2016   Foot pain, bilateral 01/05/2018   Gastroesophageal reflux disease    Gastroesophageal reflux disease without esophagitis 05/13/2015   Generalized abdominal pain  12/16/2015   GERD (gastroesophageal reflux disease)    H/O: hysterectomy    Herpes simplex 08/17/2016   History of adenomatous polyp of colon 06/14/2016   History of blood transfusion 1970   Hx of migraines 05/15/2009   Hyperlipemia 12/10/2020   Hypothyroidism 10/30/2013   Hypotonic bladder 10/30/2013   Insomnia 10/11/2017   Localized edema 12/16/2015   Low back pain 09/17/2015   Lumbar paraspinal muscle spasm 04/21/2015   Major depression, recurrent ([place]) 07/14/2013   Major depressive disorder, recurrent, mild ([place]) 03/14/2017   Malaise and fatigue 04/21/2015   MCI (mild cognitive impairment) 08/18/2015   Memory difficulty 09/17/2015   Menopause 03/31/2014   Midline low back pain without sciatica 03/31/2014   Migraine    Mild intermittent extrinsic asthma 12/16/2015   Mild persistent asthma 05/13/2015   Mixed dyslipidemia 11/12/2019   Nausea vomiting and diarrhea 11/19/2020   Neck pain 09/17/2015   Neuropathy 09/22/2014   Nonintractable headache 11/02/2016   Other allergic rhinitis 05/13/2015   Palpitation 09/03/2017   Pedal edema 07/29/2019   Pelvic pain in female 02/17/2015   Personality disorder ([place]) 10/19/2012   Pharyngoesophageal dysphagia 04/21/2015   Plantar fasciitis 01/20/2016   Precordial chest pain 08/18/2015   Primary insomnia 10/11/2017   Prolapse of vaginal vault after hysterectomy 10/30/2013   Rectocele 10/30/2013   Seasonal allergies 10/15/2019   Senile purpura ([place]) 06/15/2020   Shin splint 04/21/2015   Shortness of breath 07/17/2016   Sinusitis, chronic 10/30/2013   Slow transit constipation 07/23/2014   SOB (shortness of breath) 11/19/2020   Stable angina 10/28/2019  Swelling of lower limb 05/17/2016   Tension-type headache, not intractable 09/17/2015   Thyroid disease    Tinnitus of both ears 10/11/2017   Unstable angina ([place]) 07/29/2019   Urethral stricture 10/30/2013   Urinary frequency 02/23/2021   Urinary incontinence    Urinary  incontinence without sensory awareness 07/17/2016   Varicose veins of both lower extremities 04/24/2016   Venous insufficiency (chronic) (peripheral) 06/06/2016    Objective: BP 120/68 (BP Location: Left Arm, Patient Position: Sitting, Cuff Size: Normal)   Pulse 62   Temp (!) 97.5 F (36.4 C) (Oral)   Ht '5\' 4"'$  (1.626 m)   Wt 135 lb 2 oz (61.3 kg)   SpO2 99%   BMI 23.19 kg/m  General: Awake, appears stated age HEENT: 100% obstructed on R, patent on L, TM neg on L Heart: RRR, no LE edema Lungs: CTAB, no rales, wheezes or rhonchi. No accessory muscle use Psych: Age appropriate judgment and insight, normal affect and mood  Assessment and Plan: Burning pain  Moderate persistent asthma without complication  Screen for colon cancer - Plan: Ambulatory referral to Gastroenterology  Chronic, stable. Cont Effexor 75 mg/d. Chronic, stable. Breathing better now. Cont Advair and allergy med regimen.  Will refer to [facility] as her daughter lives in [place] and that would be better for her.  The patient voiced understanding and agreement to the plan.  [place], DO 06/12/22  9:58 AM

## 2022-06-12 NOTE — Patient Instructions (Addendum)
If you do not hear anything about your referral in the next 1-2 weeks, call our office and ask for an update.  Let us know if you need anything.

## 2022-06-14 ENCOUNTER — Telehealth: Payer: Self-pay | Admitting: Family Medicine

## 2022-06-14 NOTE — Telephone Encounter (Signed)
Called the patient back to inform she needs to sign a release of records either at our office or [facility] to give permission to have copy sent to PCP. She verbalized understanding.will take care of

## 2022-06-14 NOTE — Telephone Encounter (Signed)
Pt states she was wrong and actually had a colonoscopy done back in April 2022 at [facility], she said we can fax a ROI request to [phone].

## 2022-06-21 NOTE — Telephone Encounter (Signed)
The patient states her last colonoscopy was done in April of 2022. She states she is getting a copy to her PCP. She got a call today for a call for a colonoscopy. She is a little confused Has a copy of the 2022 report come through here yet?

## 2022-06-21 NOTE — Telephone Encounter (Signed)
Patient called requesting a call back to discuss her ROI and to see if she needs to get one done. Please advise.

## 2022-08-09 ENCOUNTER — Other Ambulatory Visit: Payer: Self-pay | Admitting: Family Medicine

## 2022-08-09 DIAGNOSIS — J4540 Moderate persistent asthma, uncomplicated: Secondary | ICD-10-CM

## 2022-08-09 MED ORDER — ALBUTEROL SULFATE HFA 108 (90 BASE) MCG/ACT IN AERS: 2.0000 | INHALATION_SPRAY | RESPIRATORY_TRACT | 1 refills | Status: DC | PRN

## 2022-08-28 ENCOUNTER — Encounter: Payer: Self-pay | Admitting: *Deleted

## 2022-09-04 ENCOUNTER — Ambulatory Visit (INDEPENDENT_AMBULATORY_CARE_PROVIDER_SITE_OTHER): Payer: PPO | Admitting: Family Medicine

## 2022-09-04 ENCOUNTER — Encounter: Payer: Self-pay | Admitting: Family Medicine

## 2022-09-04 VITALS — BP 128/54 | HR 68 | Temp 97.5°F | Ht 64.0 in | Wt 132.0 lb

## 2022-09-04 DIAGNOSIS — J0140 Acute pansinusitis, unspecified: Secondary | ICD-10-CM

## 2022-09-04 DIAGNOSIS — R300 Dysuria: Secondary | ICD-10-CM | POA: Diagnosis not present

## 2022-09-04 LAB — POC URINALSYSI DIPSTICK (AUTOMATED)
Bilirubin, UA: NEGATIVE
Blood, UA: NEGATIVE
Glucose, UA: NEGATIVE
Ketones, UA: NEGATIVE
Nitrite, UA: NEGATIVE
Protein, UA: NEGATIVE
Spec Grav, UA: <=1.005 — AB (ref 1.010–1.025)
Urobilinogen, UA: 0.2 E.U./dL
pH, UA: 7.5 (ref 5.0–8.0)

## 2022-09-04 MED ORDER — IPRATROPIUM-ALBUTEROL 0.5-2.5 (3) MG/3ML IN SOLN: 3.0000 mL | RESPIRATORY_TRACT | 0 refills | Status: AC | PRN

## 2022-09-04 MED ORDER — OLOPATADINE HCL 0.1 % OP SOLN: 1.0000 [drp] | Freq: Two times a day (BID) | OPHTHALMIC | 1 refills | Status: AC

## 2022-09-04 MED ORDER — FLUTICASONE PROPIONATE 50 MCG/ACT NA SUSP
2.0000 | NASAL | 5 refills | Status: DC | PRN
Start: 2022-09-04 — End: 2024-03-11

## 2022-09-04 MED ORDER — CEPHALEXIN 500 MG PO CAPS
500.0000 mg | ORAL_CAPSULE | Freq: Two times a day (BID) | ORAL | 0 refills | Status: AC
Start: 2022-09-04 — End: 2022-09-11

## 2022-09-04 NOTE — Progress Notes (Signed)
Acute Office Visit  Subjective:     Patient ID: Jenna Campbell, female    DOB: 1950/09/10, 72 y.o.   MRN: [id number]  Chief Complaint  Patient presents with   Nasal Congestion    HPI Patient is in today for URI symptoms.  Patient states that last Friday, she started with significant headache, sinus pressure, ear pressure, postnasal drainage, nasal congestion, sore throat, dry cough, sneezing, fatigue. She has also had some slight increase in suprapubic pressure and dysuria. She is trying to stay well hydrated. She denies fevers, chills, nausea, vomiting, chest pain, dyspnea, wheezing. No known sick contacts. She has had a negative home COVID test. So far she has only tried Mucinex.      ROS All review of systems negative except what is listed in the HPI      Objective:    BP (!) 128/54   Pulse 68   Temp (!) 97.5 F (36.4 C) (Oral)   Ht  (1.626 m)   Wt 132 lb (59.9 kg)   SpO2 100%   BMI 22.66 kg/m    Physical Exam Vitals reviewed.  Constitutional:      Appearance: Normal appearance.  HENT:     Head: Normocephalic and atraumatic.     Right Ear: There is impacted cerumen.     Left Ear: Tympanic membrane normal.     Nose: Congestion present.     Mouth/Throat:     Mouth: Mucous membranes are moist.     Pharynx: Oropharynx is clear. No oropharyngeal exudate or posterior oropharyngeal erythema.  Eyes:     Conjunctiva/sclera: Conjunctivae normal.  Cardiovascular:     Rate and Rhythm: Normal rate and regular rhythm.     Pulses: Normal pulses.     Heart sounds: Normal heart sounds.  Pulmonary:     Effort: Pulmonary effort is normal.     Breath sounds: Normal breath sounds.  Musculoskeletal:     Cervical back: Normal range of motion and neck supple. No tenderness.  Lymphadenopathy:     Cervical: No cervical adenopathy.  Skin:    General: Skin is warm and dry.  Neurological:     Mental Status: She is alert and oriented to person, place, and time.   Psychiatric:        Mood and Affect: Mood normal.        Behavior: Behavior normal.        Thought Content: Thought content normal.        Judgment: Judgment normal.     Results for orders placed or performed in visit on 09/04/22  POCT Urinalysis Dipstick (Automated)  Result Value Ref Range   Color, UA yellow    Clarity, UA clear    Glucose, UA Negative Negative   Bilirubin, UA negative    Ketones, UA negative    Spec Grav, UA <=1.005 (A) 1.010 - 1.025   Blood, UA negative    pH, UA 7.5 5.0 - 8.0   Protein, UA Negative Negative   Urobilinogen, UA 0.2 0.2 or 1.0 E.U./dL   Nitrite, UA negative    Leukocytes, UA Moderate (2+) (A) Negative        Assessment & Plan:   Problem List Items Addressed This Visit     Dysuria Urine is suggesting a UTI, I will send in Keflex and let you know if we need to change anything based on the culture. Stay well hydrated!    Relevant Medications   cephALEXin (KEFLEX) 500 MG capsule  Other Relevant Orders   POCT Urinalysis Dipstick (Automated) (Completed)   Urine Culture   Other Visit Diagnoses     Acute non-recurrent pansinusitis    -  Primary Start with saline rinses/nasal irrigation. Add Flonase. Continue Astelin.  Continue supportive measures including rest, hydration, humidifier use, steam showers, warm compresses to sinuses, warm liquids with lemon and honey, and over-the-counter cough, cold, and analgesics as needed.     Relevant Medications   fluticasone (FLONASE) 50 MCG/ACT nasal spray   cephALEXin (KEFLEX) 500 MG capsule       Meds ordered this encounter  Medications   ipratropium-albuterol (DUONEB) 0.5-2.5 (3) MG/3ML SOLN    Sig: Take 3 mLs by nebulization every 4 (four) hours as needed.    Dispense:  75 mL    Refill:  0   olopatadine (PATANOL) 0.1 % ophthalmic solution    Sig: Place 1 drop into both eyes 2 (two) times daily.    Dispense:  5 mL    Refill:  1   fluticasone (FLONASE) 50 MCG/ACT nasal spray    Sig:  Place 2 sprays into both nostrils as needed for rhinitis or allergies.    Dispense:  16 g    Refill:  5    Order Specific Question:   Supervising Provider    Answer:   Danise Edge A [[id number]]   cephALEXin (KEFLEX) 500 MG capsule    Sig: Take 1 capsule (500 mg total) by mouth 2 (two) times daily for 7 days.    Dispense:  14 capsule    Refill:  0    Order Specific Question:   Supervising Provider    Answer:   Danise Edge A [[id number]]    Return if symptoms worsen or fail to improve.  [place] Dana, NP

## 2022-09-04 NOTE — Patient Instructions (Addendum)
Start with saline rinses/nasal irrigation. Add Flonase. Continue Astelin.  Continue supportive measures including rest, hydration, humidifier use, steam showers, warm compresses to sinuses, warm liquids with lemon and honey, and over-the-counter cough, cold, and analgesics as needed.  Urine is suggesting a UTI, I will send in Kefelx and let you know if we need to change anything based on the culture. Stay well hydrated!  Please contact office for follow-up if symptoms do not improve or worsen. Seek emergency care if symptoms become severe.

## 2022-09-05 ENCOUNTER — Telehealth: Payer: Self-pay | Admitting: Cardiology

## 2022-09-05 LAB — URINE CULTURE
MICRO NUMBER:: 14856020
Result:: NO GROWTH
SPECIMEN QUALITY:: ADEQUATE

## 2022-09-05 NOTE — Telephone Encounter (Signed)
Patient is wanting to do a provider switch from Dr. Tomie China to Dr. Bing Matter. Please advise.

## 2022-09-08 ENCOUNTER — Other Ambulatory Visit: Payer: Self-pay | Admitting: Family Medicine

## 2022-09-08 MED ORDER — PANTOPRAZOLE SODIUM 40 MG PO TBEC: 40.0000 mg | DELAYED_RELEASE_TABLET | Freq: Two times a day (BID) | ORAL | 3 refills | Status: DC

## 2022-09-11 ENCOUNTER — Other Ambulatory Visit: Payer: Self-pay | Admitting: Family Medicine

## 2022-09-11 DIAGNOSIS — G720 Drug-induced myopathy: Secondary | ICD-10-CM

## 2022-09-11 NOTE — Progress Notes (Signed)
[facility] Quality Pharmacy Team Statin Quality Measure Assessment  09/11/2022  Jenna Campbell 1950/12/03 [id number]   I am a [facility] clinical pharmacist that reviews patients for statin quality initiatives.     Per review of chart and payor information, patient has a diagnosis of ASCVD. This places patient into the Statin use for Patients with Cardiovascular Disease [facility]) measure for CMS. The patient is not currently filling a statin prescription as a result of myopathy. There is no antihyperlipidemic medication on file.  Last year, dx code statin myopathy (G72.0 or G72.9) was added to the patient's encounter at [facility] 05/10/2022. Next appointment is on 09/12/2022. If deemed therapeutically appropriate, please consider associating exclusion code (see options below) to remove the patient from this year's measure.  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   Harlon Flor, PharmD Clinical Pharmacist  [facility] [phone]

## 2022-09-12 ENCOUNTER — Encounter: Payer: Self-pay | Admitting: Family Medicine

## 2022-09-12 ENCOUNTER — Ambulatory Visit (INDEPENDENT_AMBULATORY_CARE_PROVIDER_SITE_OTHER): Payer: PPO | Admitting: Family Medicine

## 2022-09-12 VITALS — BP 114/72 | HR 71 | Temp 98.0°F | Ht 66.0 in | Wt 131.1 lb

## 2022-09-12 DIAGNOSIS — Z0001 Encounter for general adult medical examination with abnormal findings: Secondary | ICD-10-CM

## 2022-09-12 DIAGNOSIS — J4541 Moderate persistent asthma with (acute) exacerbation: Secondary | ICD-10-CM

## 2022-09-12 DIAGNOSIS — I10 Essential (primary) hypertension: Secondary | ICD-10-CM | POA: Diagnosis not present

## 2022-09-12 DIAGNOSIS — Z85828 Personal history of other malignant neoplasm of skin: Secondary | ICD-10-CM | POA: Diagnosis not present

## 2022-09-12 DIAGNOSIS — Z0000 Encounter for general adult medical examination without abnormal findings: Secondary | ICD-10-CM

## 2022-09-12 DIAGNOSIS — G720 Drug-induced myopathy: Secondary | ICD-10-CM | POA: Insufficient documentation

## 2022-09-12 DIAGNOSIS — Z1211 Encounter for screening for malignant neoplasm of colon: Secondary | ICD-10-CM

## 2022-09-12 HISTORY — DX: Drug-induced myopathy: G72.0

## 2022-09-12 MED ORDER — METHYLPREDNISOLONE ACETATE 80 MG/ML IJ SUSP
80.0000 mg | Freq: Once | INTRAMUSCULAR | Status: AC
Start: 2022-09-12 — End: 2022-09-12
  Administered 2022-09-12: 80 mg via INTRAMUSCULAR

## 2022-09-12 NOTE — Progress Notes (Signed)
Chief Complaint  Patient presents with   Annual Exam     Well Woman Jenna Campbell is here for a complete physical.   Her last physical was >1 year ago.  Current diet: in general, a "healthy" diet. Current exercise: walking. Weight is stable and she denies daytime fatigue. Seatbelt? Yes Advanced directive? Yes  Health Maintenance Colonoscopy- Due, referred 2x in past 6.5 mo Shingrix- No DEXA- Yes Mammogram- Yes Tetanus- Yes Pneumonia- Yes Hep C screen- Yes  URI Duration: 9 days  Associated symptoms: sinus congestion, rhinorrhea, wheezing, shortness of breath, coughing, and chest tightness Denies: sinus pain, itchy watery eyes, ear pain, ear drainage, sore throat, myalgia, and fevers Treatment to date: OTC meds Sick contacts: No  Past Medical History:  Diagnosis Date   Acute bronchitis due to other specified organisms 04/21/2015   Acute hyponatremia 11/19/2020   Acute tension-type headache 12/16/2015   Adjustment disorder with anxious mood 04/21/2015   Allergic asthma with acute exacerbation 09/22/2014   Allergic rhinitis due to pollen 07/23/2014   Allergic urticaria 05/13/2015   Allergy    Alopecia 10/30/2013   Angina pectoris (HCC) 07/29/2019   Asthma    Atrophic vaginitis 10/30/2013   Basal cell carcinoma 04/21/2015   Bilateral impacted cerumen 04/21/2015   CAD (coronary artery disease) 10/28/2019   Cervical radiculopathy 09/17/2015   Change in bowel function 06/14/2016   Chest pain in adult 05/28/2017   Chest tightness 07/29/2019   Chronic diarrhea 12/16/2015   Colon polyps    Cough 02/14/2016   COVID-19 virus infection 11/19/2020   Cystocele, midline 10/30/2013   Dermatitis 09/22/2014   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   Diverticulosis of large intestine 10/30/2013   Dupuytren's contracture of both hands 09/17/2019   Dyspareunia 07/23/2014   Dysuria 12/16/2015   Encounter for long-term (current) use of other medications 10/19/2012   Esophageal  dysphagia 06/14/2016   Essential hypertension    Family history of pheochromocytoma 02/14/2016   Foot pain, bilateral 01/05/2018   Gastroesophageal reflux disease    Gastroesophageal reflux disease without esophagitis 05/13/2015   Generalized abdominal pain 12/16/2015   GERD (gastroesophageal reflux disease)    H/O: hysterectomy    Herpes simplex 08/17/2016   History of adenomatous polyp of colon 06/14/2016   History of blood transfusion 1970   Hx of migraines 05/15/2009   Hyperlipemia 12/10/2020   Hypothyroidism 10/30/2013   Hypotonic bladder 10/30/2013   Insomnia 10/11/2017   Localized edema 12/16/2015   Low back pain 09/17/2015   Lumbar paraspinal muscle spasm 04/21/2015   Major depression, recurrent (HCC) 07/14/2013   Major depressive disorder, recurrent, mild (HCC) 03/14/2017   Malaise and fatigue 04/21/2015   MCI (mild cognitive impairment) 08/18/2015   Memory difficulty 09/17/2015   Menopause 03/31/2014   Midline low back pain without sciatica 03/31/2014   Migraine    Mild intermittent extrinsic asthma 12/16/2015   Mild persistent asthma 05/13/2015   Mixed dyslipidemia 11/12/2019   Nausea vomiting and diarrhea 11/19/2020   Neck pain 09/17/2015   Neuropathy 09/22/2014   Nonintractable headache 11/02/2016   Other allergic rhinitis 05/13/2015   Palpitation 09/03/2017   Pedal edema 07/29/2019   Pelvic pain in female 02/17/2015   Personality disorder (HCC) 10/19/2012   Pharyngoesophageal dysphagia 04/21/2015   Plantar fasciitis 01/20/2016   Precordial chest pain 08/18/2015   Primary insomnia 10/11/2017   Prolapse of vaginal vault after hysterectomy 10/30/2013   Rectocele 10/30/2013   Seasonal allergies 10/15/2019   Senile purpura (HCC) 06/15/2020   Shin splint  04/21/2015   Shortness of breath 07/17/2016   Sinusitis, chronic 10/30/2013   Slow transit constipation 07/23/2014   SOB (shortness of breath) 11/19/2020   Stable angina 10/28/2019   Swelling of lower limb  05/17/2016   Tension-type headache, not intractable 09/17/2015   Thyroid disease    Tinnitus of both ears 10/11/2017   Unstable angina (HCC) 07/29/2019   Urethral stricture 10/30/2013   Urinary frequency 02/23/2021   Urinary incontinence    Urinary incontinence without sensory awareness 07/17/2016   Varicose veins of both lower extremities 04/24/2016   Venous insufficiency (chronic) (peripheral) 06/06/2016     Past Surgical History:  Procedure Laterality Date   BREAST CYST ASPIRATION Left    25 years ago    COLONOSCOPY  04/2016   [facility] Diverticulitis and Multiple colon polyps   CORONARY PRESSURE/FFR STUDY N/A 10/17/2019   Procedure: INTRAVASCULAR PRESSURE WIRE/FFR STUDY;  Surgeon: Yvonne Kendall, MD;  Location: [facility];  Service: Cardiovascular;  Laterality: N/A;   ESOPHAGOGASTRODUODENOSCOPY  04/2016   [facility]   LEFT HEART CATH AND CORONARY ANGIOGRAPHY N/A 10/17/2019   Procedure: LEFT HEART CATH AND CORONARY ANGIOGRAPHY;  Surgeon: Yvonne Kendall, MD;  Location: [facility];  Service: Cardiovascular;  Laterality: N/A;   MOHS SURGERY  10/05/2015   RECTOCELE REPAIR  2011   SKIN CANCER EXCISION  02/2015   Squamous cell removed from back   TONSILLECTOMY     VAGINAL HYSTERECTOMY     VAGINAL PROLAPSE REPAIR      Medications  Current Outpatient Medications on File Prior to Visit  Medication Sig Dispense Refill   acetaminophen (TYLENOL) 500 MG tablet Take 500 mg by mouth every 6 (six) hours as needed for moderate pain.     albuterol (PROAIR HFA) 108 (90 Base) MCG/ACT inhaler Inhale 2 puffs into the lungs every 4 (four) hours as needed for wheezing or shortness of breath. 18 g 1   ASPIRIN 81 PO Take 81 mg by mouth daily.      azelastine (ASTELIN) 0.1 % nasal spray Place 2 sprays into both nostrils 2 (two) times daily. Use in each nostril as directed 30 mL 12   betamethasone valerate (VALISONE) 0.1 % cream APPLY TO AFFECTED AREA TWICE A DAY 45 g 3    Calcium-Phosphorus-Vitamin D (CALCIUM GUMMIES PO) Take by mouth.     cholecalciferol (VITAMIN D3) 25 MCG (1000 UNIT) tablet Take 1,000 Units by mouth daily.     cholestyramine (QUESTRAN) 4 GM/DOSE powder Take 1 packet (4 g total) by mouth 3 (three) times daily with meals. 378 g 12   Coenzyme Q10 (CO Q-10) 200 MG CAPS Take 200 mg by mouth daily. 90 capsule 3   fluticasone (FLONASE) 50 MCG/ACT nasal spray Place 2 sprays into both nostrils as needed for rhinitis or allergies. 16 g 5   fluticasone-salmeterol (ADVAIR DISKUS) 250-50 MCG/ACT AEPB TAKE 1 PUFF BY MOUTH TWICE A DAY 60 each 2   furosemide (LASIX) 40 MG tablet Take 1 tablet (40 mg total) by mouth daily. 90 tablet 1   gabapentin (NEURONTIN) 300 MG capsule Take 300 mg by mouth in the morning.     ibuprofen (ADVIL) 200 MG tablet Take 400 mg by mouth every 6 (six) hours as needed for mild pain.     ipratropium-albuterol (DUONEB) 0.5-2.5 (3) MG/3ML SOLN Take 3 mLs by nebulization every 4 (four) hours as needed. 75 mL 0   isosorbide mononitrate (IMDUR) 30 MG 24 hr tablet TAKE  1/2 TABLET BY MOUTH DAILY 45 tablet 3   levocetirizine (XYZAL) 5 MG tablet Take 5 mg by mouth daily.     levothyroxine (SYNTHROID) 150 MCG tablet TAKE 1.5 TABLETS (225 MCG TOTAL) BY MOUTH DAILY BEFORE BREAKFAST. 135 tablet 2   LORazepam (ATIVAN) 0.5 MG tablet Take 0.5 mg by mouth every 8 (eight) hours as needed for anxiety.      nitroGLYCERIN (NITROSTAT) 0.4 MG SL tablet Place 1 tablet (0.4 mg total) under the tongue every 5 (five) minutes as needed for chest pain. 25 tablet 10   olopatadine (PATANOL) 0.1 % ophthalmic solution Place 1 drop into both eyes 2 (two) times daily. 5 mL 1   pantoprazole (PROTONIX) 40 MG tablet TAKE 1 TABLET BY MOUTH TWICE A DAY 180 tablet 1   potassium chloride (KLOR-CON) 10 MEQ tablet TAKE 2 TABLETS WITH EVERY DOSE OF LASIX 40 MG. 180 tablet 1   temazepam (RESTORIL) 15 MG capsule Take 15 mg by mouth at bedtime.      topiramate (TOPAMAX) 100 MG  tablet Take 200 mg by mouth at bedtime.     valACYclovir (VALTREX) 1000 MG tablet TAKE 1 TABLET BY MOUTH ONCE DAILY FOR 5 DAYS DURING OUTBREAKS. 90 tablet 0   venlafaxine XR (EFFEXOR XR) 75 MG 24 hr capsule Take 1 capsule (75 mg total) by mouth daily with breakfast. 30 capsule 1    Allergies Allergies  Allergen Reactions   Iodinated Contrast Media Itching, Other (See Comments), Rash and Shortness Of Breath    Throat Swelling, Erythema   Kenalog [Triamcinolone Acetonide] Swelling   Metrizamide Itching, Other (See Comments), Rash and Shortness Of Breath    Throat Swelling, Erythema   Other Itching and Swelling    DUST, spicy food, red sause   Penicillins Anaphylaxis and Itching   Crestor [Rosuvastatin] Other (See Comments)    myalgia   Lipitor [Atorvastatin] Other (See Comments)    myalgia   Montelukast Sodium Other (See Comments)    Chest pain, dizziness and lightheadedness   Pitavastatin    Xyzal [Levocetirizine]     Chest pressure   Ciprofloxacin Itching   Diazepam Rash   Latex Itching, Rash and Swelling   Sulfa Antibiotics Rash    Review of Systems: Constitutional:  no fevers Eye:  no recent significant change in vision Ears:  No changes in hearing Nose/Mouth/Throat:  no complaints of nasal congestion, no sore throat Cardiovascular: no chest pain, +tightness Respiratory: +shortness of breath Gastrointestinal:  No change in bowel habits GU:  Female: negative for dysuria Integumentary:  +area where she had BCC on upper L lip seemingly returning Neurologic:  no headaches Endocrine:  denies unexplained weight changes  Exam BP 114/72 (BP Location: Left Arm, Patient Position: Sitting, Cuff Size: Normal)   Pulse 71   Temp 98 F (36.7 C) (Oral)   Ht 5\' 6"  (1.676 m)   Wt 131 lb 2 oz (59.5 kg)   SpO2 97%   BMI 21.16 kg/m  General:  well developed, well nourished, in no apparent distress Skin:   small macular lesion with scaling over L upper lip/mouth region; otherwise  no significant moles, warts, or growths Head:  no masses, lesions, or tenderness Eyes:  pupils equal and round, sclera anicteric without injection Ears:  canals without lesions, TMs shiny without retraction, no obvious effusion, no erythema Nose:  nares patent, mucosa normal, and no drainage Throat/Pharynx:  lips and gingiva without lesion; tongue and uvula midline; non-inflamed pharynx; no exudates or postnasal drainage  Neck: neck supple without adenopathy, thyromegaly, or masses Lungs:  clear to auscultation, breath sounds equal bilaterally but decreased throughout, no respiratory distress Cardio:  regular rate and rhythm, no bruits or LE edema Abdomen:  abdomen soft, nontender; bowel sounds normal; no masses or organomegaly Genital: Deferred Neuro:  gait normal; deep tendon reflexes normal and symmetric Psych: well oriented with normal range of affect and appropriate judgment/insight  Assessment and Plan  Well adult exam  Drug-induced myopathy  History of basal cell carcinoma - Plan: Ambulatory referral to Dermatology  Essential hypertension - Plan: CBC, Comprehensive metabolic panel, Lipid panel, EKG 12-Lead  Moderate persistent asthma with exacerbation   Well 72 y.o. female. Counseled on diet and exercise. Refer back to derm.  Asthma exacerbation: exacerbation of chronic issue. EKG unremarkable today. Will tx for asthma flare 2/2 allergies vs viral URI. Depomedrol injection today. Send message or call if no better in a few days.  Other orders as above. Follow up in 6 mo or prn. The patient voiced understanding and agreement to the plan.  Jilda Roche [place], DO 09/12/22 1:57 PM

## 2022-09-12 NOTE — Patient Instructions (Addendum)
Give [place] 2-3 business days to get the results of your labs back.   Keep the diet clean and stay active.  If you do not hear anything about your referral in the next 1-2 weeks, call our office and ask for an update.  Please call: [phone] to schedule your consultation regarding colon cancer screening.  Let us know if you need anything.

## 2022-09-13 LAB — CBC
HCT: 38.2 % (ref 36.0–46.0)
Hemoglobin: 12.8 g/dL (ref 12.0–15.0)
MCHC: 33.7 g/dL (ref 30.0–36.0)
MCV: 89.8 fl (ref 78.0–100.0)
Platelets: 277 10*3/uL (ref 150.0–400.0)
RBC: 4.25 Mil/uL (ref 3.87–5.11)
RDW: 12.7 % (ref 11.5–15.5)
WBC: 7.2 10*3/uL (ref 4.0–10.5)

## 2022-09-13 LAB — COMPREHENSIVE METABOLIC PANEL
ALT: 12 U/L (ref 0–35)
AST: 15 U/L (ref 0–37)
Albumin: 3.9 g/dL (ref 3.5–5.2)
Alkaline Phosphatase: 56 U/L (ref 39–117)
BUN: 17 mg/dL (ref 6–23)
CO2: 25 mEq/L (ref 19–32)
Calcium: 8.8 mg/dL (ref 8.4–10.5)
Chloride: 99 mEq/L (ref 96–112)
Creatinine, Ser: 0.73 mg/dL (ref 0.40–1.20)
GFR: 82.06 mL/min (ref >60.00)
Glucose, Bld: 96 mg/dL (ref 70–99)
Potassium: 3.7 mEq/L (ref 3.5–5.1)
Sodium: 134 mEq/L — ABNORMAL LOW (ref 135–145)
Total Bilirubin: 0.5 mg/dL (ref 0.2–1.2)
Total Protein: 6.8 g/dL (ref 6.0–8.3)

## 2022-09-13 LAB — LIPID PANEL
Cholesterol: 173 mg/dL (ref 0–200)
HDL: 39.6 mg/dL (ref >39.00)
LDL Cholesterol: 102 mg/dL — ABNORMAL HIGH (ref 0–99)
NonHDL: 133.64
Total CHOL/HDL Ratio: 4
Triglycerides: 157 mg/dL — ABNORMAL HIGH (ref 0.0–149.0)
VLDL: 31.4 mg/dL (ref 0.0–40.0)

## 2022-09-18 DIAGNOSIS — D225 Melanocytic nevi of trunk: Secondary | ICD-10-CM | POA: Diagnosis not present

## 2022-09-18 DIAGNOSIS — C4402 Squamous cell carcinoma of skin of lip: Secondary | ICD-10-CM | POA: Diagnosis not present

## 2022-09-19 ENCOUNTER — Telehealth: Payer: Self-pay | Admitting: Family Medicine

## 2022-09-19 NOTE — Telephone Encounter (Signed)
Pt said she ended up going to [facility] in [place] to have the biopsy on the area on her face and she should get the results in 7-10 days.

## 2022-09-28 ENCOUNTER — Encounter: Payer: Self-pay | Admitting: Family Medicine

## 2022-09-29 ENCOUNTER — Encounter: Payer: Self-pay | Admitting: Family Medicine

## 2022-10-02 ENCOUNTER — Telehealth: Payer: Self-pay | Admitting: Family Medicine

## 2022-10-02 ENCOUNTER — Other Ambulatory Visit: Payer: Self-pay | Admitting: Family Medicine

## 2022-10-02 DIAGNOSIS — Z1231 Encounter for screening mammogram for malignant neoplasm of breast: Secondary | ICD-10-CM

## 2022-10-02 NOTE — Telephone Encounter (Signed)
Patient called and asked if an order for a mammogram can be out in .   Please advise

## 2022-10-02 NOTE — Telephone Encounter (Signed)
Referral done/patient is aware

## 2022-10-05 ENCOUNTER — Encounter (HOSPITAL_BASED_OUTPATIENT_CLINIC_OR_DEPARTMENT_OTHER): Payer: Self-pay

## 2022-10-05 ENCOUNTER — Ambulatory Visit (HOSPITAL_BASED_OUTPATIENT_CLINIC_OR_DEPARTMENT_OTHER)
Admission: RE | Admit: 2022-10-05 | Discharge: 2022-10-05 | Disposition: A | Discharge status: Home / Self Care | Payer: PPO | Source: Ambulatory Visit | Attending: Family Medicine | Admitting: Family Medicine

## 2022-10-05 DIAGNOSIS — Z1231 Encounter for screening mammogram for malignant neoplasm of breast: Secondary | ICD-10-CM | POA: Diagnosis not present

## 2022-10-26 DIAGNOSIS — F5101 Primary insomnia: Secondary | ICD-10-CM | POA: Diagnosis not present

## 2022-10-26 DIAGNOSIS — F330 Major depressive disorder, recurrent, mild: Secondary | ICD-10-CM | POA: Diagnosis not present

## 2022-10-26 DIAGNOSIS — F411 Generalized anxiety disorder: Secondary | ICD-10-CM | POA: Diagnosis not present

## 2022-11-03 ENCOUNTER — Other Ambulatory Visit: Payer: Self-pay | Admitting: Family Medicine

## 2022-11-03 DIAGNOSIS — M7989 Other specified soft tissue disorders: Secondary | ICD-10-CM

## 2022-11-06 DIAGNOSIS — F329 Major depressive disorder, single episode, unspecified: Secondary | ICD-10-CM | POA: Diagnosis not present

## 2022-11-22 DIAGNOSIS — M545 Low back pain, unspecified: Secondary | ICD-10-CM | POA: Diagnosis not present

## 2022-11-22 DIAGNOSIS — R519 Headache, unspecified: Secondary | ICD-10-CM | POA: Diagnosis not present

## 2022-12-11 ENCOUNTER — Encounter: Payer: Self-pay | Admitting: Physician Assistant

## 2022-12-11 ENCOUNTER — Other Ambulatory Visit (HOSPITAL_COMMUNITY)
Admission: RE | Admit: 2022-12-11 | Discharge: 2022-12-11 | Disposition: A | Discharge status: Home / Self Care | Payer: PPO | Source: Ambulatory Visit | Attending: Physician Assistant | Admitting: Physician Assistant

## 2022-12-11 ENCOUNTER — Ambulatory Visit (INDEPENDENT_AMBULATORY_CARE_PROVIDER_SITE_OTHER): Payer: PPO | Admitting: Physician Assistant

## 2022-12-11 VITALS — BP 132/78 | HR 56 | Temp 98.0°F | Ht 66.0 in | Wt 130.0 lb

## 2022-12-11 DIAGNOSIS — M7989 Other specified soft tissue disorders: Secondary | ICD-10-CM

## 2022-12-11 DIAGNOSIS — N898 Other specified noninflammatory disorders of vagina: Secondary | ICD-10-CM | POA: Insufficient documentation

## 2022-12-11 DIAGNOSIS — R52 Pain, unspecified: Secondary | ICD-10-CM

## 2022-12-11 LAB — POCT URINALYSIS DIP (MANUAL ENTRY)
Bilirubin, UA: NEGATIVE
Blood, UA: NEGATIVE
Glucose, UA: NEGATIVE mg/dL
Ketones, POC UA: NEGATIVE mg/dL
Nitrite, UA: NEGATIVE
Protein Ur, POC: NEGATIVE mg/dL
Spec Grav, UA: <=1.005 — AB (ref 1.010–1.025)
Urobilinogen, UA: 0.2 E.U./dL
pH, UA: 7.5 (ref 5.0–8.0)

## 2022-12-11 NOTE — Progress Notes (Signed)
g     Established patient visit   Patient: Jenna Campbell   DOB: 09-01-1950   72 y.o. Female  MRN: [id number] Visit Date: 12/11/2022  Today's healthcare provider: Alfredia Ferguson, PA-C   Chief Complaint  Patient presents with   Herpes Zoster    Possibles rash on back with pain   Subjective    HPI Discussed the use of AI scribe software for clinical note transcription with the patient, who gave verbal consent to proceed.  History of Present Illness   The patient presents with generalized pain and discomfort. She reports seeing red patch on her upper back. She describes feeling achy all over, with some nausea and itchiness. She reports a migraine that last two days. She has been experiencing cramping in her legs, severe enough to cause her feet to curl forward and disrupt her sleep. She has been wearing pressure socks to alleviate the discomfort. She denies any recent exposure to sick individuals. She reports some sinus congestion,  and slight cough.  She also reports dryness and itchiness in the vaginal area, despite using topical estrogen. She has not been taking any over-the-counter medications, aside from her regular vitamins and prescribed medications.       Medications: Outpatient Medications Prior to Visit  Medication Sig   acetaminophen (TYLENOL) 500 MG tablet Take 500 mg by mouth every 6 (six) hours as needed for moderate pain.   albuterol (PROAIR HFA) 108 (90 Base) MCG/ACT inhaler Inhale 2 puffs into the lungs every 4 (four) hours as needed for wheezing or shortness of breath.   ASPIRIN 81 PO Take 81 mg by mouth daily.    azelastine (ASTELIN) 0.1 % nasal spray Place 2 sprays into both nostrils 2 (two) times daily. Use in each nostril as directed   betamethasone valerate (VALISONE) 0.1 % cream APPLY TO AFFECTED AREA TWICE A DAY   Calcium-Phosphorus-Vitamin D (CALCIUM GUMMIES PO) Take by mouth.   cholecalciferol (VITAMIN D3) 25 MCG (1000 UNIT) tablet Take 1,000 Units by mouth  daily.   cholestyramine (QUESTRAN) 4 GM/DOSE powder Take 1 packet (4 g total) by mouth 3 (three) times daily with meals.   Coenzyme Q10 (CO Q-10) 200 MG CAPS Take 200 mg by mouth daily.   fluticasone (FLONASE) 50 MCG/ACT nasal spray Place 2 sprays into both nostrils as needed for rhinitis or allergies.   fluticasone-salmeterol (ADVAIR DISKUS) 250-50 MCG/ACT AEPB TAKE 1 PUFF BY MOUTH TWICE A DAY   furosemide (LASIX) 40 MG tablet Take 1 tablet (40 mg total) by mouth daily.   gabapentin (NEURONTIN) 300 MG capsule Take 300 mg by mouth in the morning.   ibuprofen (ADVIL) 200 MG tablet Take 400 mg by mouth every 6 (six) hours as needed for mild pain.   ipratropium-albuterol (DUONEB) 0.5-2.5 (3) MG/3ML SOLN Take 3 mLs by nebulization every 4 (four) hours as needed.   levothyroxine (SYNTHROID) 150 MCG tablet TAKE 1.5 TABLETS (225 MCG TOTAL) BY MOUTH DAILY BEFORE BREAKFAST.   LORazepam (ATIVAN) 0.5 MG tablet Take 0.5 mg by mouth every 8 (eight) hours as needed for anxiety.    nitroGLYCERIN (NITROSTAT) 0.4 MG SL tablet Place 1 tablet (0.4 mg total) under the tongue every 5 (five) minutes as needed for chest pain.   olopatadine (PATANOL) 0.1 % ophthalmic solution Place 1 drop into both eyes 2 (two) times daily.   pantoprazole (PROTONIX) 40 MG tablet TAKE 1 TABLET BY MOUTH TWICE A DAY   potassium chloride (KLOR-CON) 10 MEQ tablet TAKE 2 TABLETS WITH  EVERY DOSE OF LASIX 40 MG.   temazepam (RESTORIL) 15 MG capsule Take 15 mg by mouth at bedtime.    topiramate (TOPAMAX) 100 MG tablet Take 200 mg by mouth at bedtime.   venlafaxine XR (EFFEXOR XR) 75 MG 24 hr capsule Take 1 capsule (75 mg total) by mouth daily with breakfast.   isosorbide mononitrate (IMDUR) 30 MG 24 hr tablet TAKE 1/2 TABLET BY MOUTH DAILY (Patient not taking: Reported on 12/11/2022)   levocetirizine (XYZAL) 5 MG tablet Take 5 mg by mouth daily. (Patient not taking: Reported on 12/11/2022)   valACYclovir (VALTREX) 1000 MG tablet TAKE 1 TABLET BY  MOUTH ONCE DAILY FOR 5 DAYS DURING OUTBREAKS. (Patient not taking: Reported on 12/11/2022)   No facility-administered medications prior to visit.        Objective    BP 132/78 (BP Location: Left Arm, Patient Position: Sitting, Cuff Size: Normal)   Pulse (!) 56   Temp 98 F (36.7 C) (Oral)   Ht 5\' 6"  (1.676 m)   Wt 130 lb (59 kg)   SpO2 98%   BMI 20.98 kg/m    Physical Exam Constitutional:      General: She is awake.     Appearance: She is well-developed.  HENT:     Head: Normocephalic.  Eyes:     Conjunctiva/sclera: Conjunctivae normal.  Cardiovascular:     Rate and Rhythm: Normal rate and regular rhythm.     Heart sounds: Normal heart sounds.  Pulmonary:     Effort: Pulmonary effort is normal.     Breath sounds: Normal breath sounds.  Musculoskeletal:     Comments: 1+ pitting edema R ankle, trace edema L ankle  Skin:    General: Skin is warm.     Comments: Pt had concerns over an erythematous area neck/upper back No rashes visualized.  Neurological:     Mental Status: She is alert and oriented to person, place, and time.  Psychiatric:        Attention and Perception: Attention normal.        Mood and Affect: Mood normal.        Speech: Speech normal.        Behavior: Behavior is cooperative.      Results for orders placed or performed in visit on 12/11/22  POCT urinalysis dipstick  Result Value Ref Range   Color, UA yellow yellow   Clarity, UA cloudy (A) clear   Glucose, UA negative negative mg/dL   Bilirubin, UA negative negative   Ketones, POC UA negative negative mg/dL   Spec Grav, UA <=5.366 (A) 1.010 - 1.025   Blood, UA negative negative   pH, UA 7.5 5.0 - 8.0   Protein Ur, POC negative negative mg/dL   Urobilinogen, UA 0.2 0.2 or 1.0 E.U./dL   Nitrite, UA Negative Negative   Leukocytes, UA Trace (A) Negative    Assessment & Plan     1. Localized swelling of both lower extremities Would not change lasix dose, pt reports some swelling in her R  ankle is her baseline Lungs cta  - Comp Met (CMET) - Magnesium - CBC w/Diff  2. Body aches Likely viral infection, afebrile. Recommending rest, hydration, otc antihistamines ie zyrtec, claritin  - Comp Met (CMET) - CBC w/Diff  3. Vaginal itching UA w/ only trace leuks, neg nitrites.  Self swab r/o yeast, pt preference.  - POCT urinalysis dipstick   Return if symptoms worsen or fail to improve.  I, Alfredia Ferguson, PA-C have reviewed all documentation for this visit. The documentation on  12/11/22   for the exam, diagnosis, procedures, and orders are all accurate and complete.    Alfredia Ferguson, PA-C  [facility] [phone] (phone) [phone] (fax)  [facility]

## 2022-12-12 ENCOUNTER — Other Ambulatory Visit: Payer: Self-pay | Admitting: Physician Assistant

## 2022-12-12 DIAGNOSIS — E871 Hypo-osmolality and hyponatremia: Secondary | ICD-10-CM

## 2022-12-12 DIAGNOSIS — D729 Disorder of white blood cells, unspecified: Secondary | ICD-10-CM

## 2022-12-12 DIAGNOSIS — D72828 Other elevated white blood cell count: Secondary | ICD-10-CM

## 2022-12-12 LAB — CBC WITH DIFFERENTIAL/PLATELET
Basophils Absolute: 0 10*3/uL (ref 0.0–0.1)
Basophils Relative: 0.5 % (ref 0.0–3.0)
Eosinophils Absolute: 0 10*3/uL (ref 0.0–0.7)
Eosinophils Relative: 0.1 % (ref 0.0–5.0)
HCT: 37.9 % (ref 36.0–46.0)
Hemoglobin: 12.6 g/dL (ref 12.0–15.0)
Lymphocytes Relative: 11.3 % — ABNORMAL LOW (ref 12.0–46.0)
Lymphs Abs: 0.8 10*3/uL (ref 0.7–4.0)
MCHC: 33.2 g/dL (ref 30.0–36.0)
MCV: 90.4 fL (ref 78.0–100.0)
Monocytes Absolute: 0.2 10*3/uL (ref 0.1–1.0)
Monocytes Relative: 2.3 % — ABNORMAL LOW (ref 3.0–12.0)
Neutro Abs: 6.1 10*3/uL (ref 1.4–7.7)
Neutrophils Relative %: 85.8 % — ABNORMAL HIGH (ref 43.0–77.0)
Platelets: 252 10*3/uL (ref 150.0–400.0)
RBC: 4.19 Mil/uL (ref 3.87–5.11)
RDW: 13.3 % (ref 11.5–15.5)
WBC: 7.1 10*3/uL (ref 4.0–10.5)

## 2022-12-12 LAB — COMPREHENSIVE METABOLIC PANEL
ALT: 14 U/L (ref 0–35)
AST: 17 U/L (ref 0–37)
Albumin: 4.1 g/dL (ref 3.5–5.2)
Alkaline Phosphatase: 55 U/L (ref 39–117)
BUN: 15 mg/dL (ref 6–23)
CO2: 26 meq/L (ref 19–32)
Calcium: 9.2 mg/dL (ref 8.4–10.5)
Chloride: 97 meq/L (ref 96–112)
Creatinine, Ser: 0.72 mg/dL (ref 0.40–1.20)
GFR: 83.28 mL/min (ref >60.00)
Glucose, Bld: 112 mg/dL — ABNORMAL HIGH (ref 70–99)
Potassium: 3.8 meq/L (ref 3.5–5.1)
Sodium: 131 meq/L — ABNORMAL LOW (ref 135–145)
Total Bilirubin: 0.5 mg/dL (ref 0.2–1.2)
Total Protein: 6.7 g/dL (ref 6.0–8.3)

## 2022-12-12 LAB — MAGNESIUM: Magnesium: 2 mg/dL (ref 1.5–2.5)

## 2022-12-13 LAB — CERVICOVAGINAL ANCILLARY ONLY
Bacterial Vaginitis (gardnerella): NEGATIVE
Candida Glabrata: NEGATIVE
Candida Vaginitis: NEGATIVE
Comment: NEGATIVE
Comment: NEGATIVE
Comment: NEGATIVE

## 2022-12-25 ENCOUNTER — Other Ambulatory Visit (INDEPENDENT_AMBULATORY_CARE_PROVIDER_SITE_OTHER): Payer: PPO

## 2022-12-25 DIAGNOSIS — E871 Hypo-osmolality and hyponatremia: Secondary | ICD-10-CM

## 2022-12-25 DIAGNOSIS — D72828 Other elevated white blood cell count: Secondary | ICD-10-CM

## 2022-12-25 DIAGNOSIS — D729 Disorder of white blood cells, unspecified: Secondary | ICD-10-CM | POA: Diagnosis not present

## 2022-12-25 LAB — CBC WITH DIFFERENTIAL/PLATELET
Basophils Absolute: 0 10*3/uL (ref 0.0–0.1)
Basophils Relative: 0.9 % (ref 0.0–3.0)
Eosinophils Absolute: 0.1 10*3/uL (ref 0.0–0.7)
Eosinophils Relative: 2.2 % (ref 0.0–5.0)
HCT: 37.9 % (ref 36.0–46.0)
Hemoglobin: 12.6 g/dL (ref 12.0–15.0)
Lymphocytes Relative: 39.7 % (ref 12.0–46.0)
Lymphs Abs: 2 10*3/uL (ref 0.7–4.0)
MCHC: 33.3 g/dL (ref 30.0–36.0)
MCV: 91.3 fl (ref 78.0–100.0)
Monocytes Absolute: 0.6 10*3/uL (ref 0.1–1.0)
Monocytes Relative: 10.9 % (ref 3.0–12.0)
Neutro Abs: 2.3 10*3/uL (ref 1.4–7.7)
Neutrophils Relative %: 46.3 % (ref 43.0–77.0)
Platelets: 279 10*3/uL (ref 150.0–400.0)
RBC: 4.15 Mil/uL (ref 3.87–5.11)
RDW: 13.4 % (ref 11.5–15.5)
WBC: 5.1 10*3/uL (ref 4.0–10.5)

## 2022-12-25 LAB — COMPREHENSIVE METABOLIC PANEL
ALT: 13 U/L (ref 0–35)
AST: 15 U/L (ref 0–37)
Albumin: 3.9 g/dL (ref 3.5–5.2)
Alkaline Phosphatase: 51 U/L (ref 39–117)
BUN: 15 mg/dL (ref 6–23)
CO2: 26 mEq/L (ref 19–32)
Calcium: 9 mg/dL (ref 8.4–10.5)
Chloride: 103 mEq/L (ref 96–112)
Creatinine, Ser: 0.74 mg/dL (ref 0.40–1.20)
GFR: 80.57 mL/min (ref >60.00)
Glucose, Bld: 83 mg/dL (ref 70–99)
Potassium: 3.7 mEq/L (ref 3.5–5.1)
Sodium: 134 mEq/L — ABNORMAL LOW (ref 135–145)
Total Bilirubin: 0.4 mg/dL (ref 0.2–1.2)
Total Protein: 6.2 g/dL (ref 6.0–8.3)

## 2023-01-05 ENCOUNTER — Other Ambulatory Visit: Payer: Self-pay | Admitting: Family Medicine

## 2023-01-05 DIAGNOSIS — J4540 Moderate persistent asthma, uncomplicated: Secondary | ICD-10-CM

## 2023-01-07 DIAGNOSIS — B029 Zoster without complications: Secondary | ICD-10-CM | POA: Diagnosis not present

## 2023-01-12 ENCOUNTER — Encounter: Payer: Self-pay | Admitting: Family Medicine

## 2023-01-12 ENCOUNTER — Ambulatory Visit (INDEPENDENT_AMBULATORY_CARE_PROVIDER_SITE_OTHER): Payer: PPO | Admitting: Family Medicine

## 2023-01-12 VITALS — BP 120/82 | HR 65 | Temp 98.1°F | Ht 66.0 in | Wt 128.0 lb

## 2023-01-12 DIAGNOSIS — S90561A Insect bite (nonvenomous), right ankle, initial encounter: Secondary | ICD-10-CM | POA: Diagnosis not present

## 2023-01-12 DIAGNOSIS — W57XXXA Bitten or stung by nonvenomous insect and other nonvenomous arthropods, initial encounter: Secondary | ICD-10-CM

## 2023-01-12 MED ORDER — DOXYCYCLINE HYCLATE 100 MG PO TABS: 100.0000 mg | ORAL_TABLET | Freq: Two times a day (BID) | ORAL | 0 refills | Status: AC

## 2023-01-12 MED ORDER — HYDROCORTISONE 2.5 % EX CREA
TOPICAL_CREAM | Freq: Two times a day (BID) | CUTANEOUS | 0 refills | Status: DC
Start: 2023-01-12 — End: 2023-06-11

## 2023-01-12 MED ORDER — FLUCONAZOLE 150 MG PO TABS
ORAL_TABLET | ORAL | 0 refills | Status: DC
Start: 2023-01-12 — End: 2023-02-28

## 2023-01-12 NOTE — Patient Instructions (Addendum)
Use the topical steroid for 2 days. If no improvement, take the antibiotic.  Ice/cold pack over area for 10-15 min twice daily.  OK to take Tylenol 1000 mg (2 extra strength tabs) or 975 mg (3 regular strength tabs) every 6 hours as needed.  When you do wash it, use only soap and water. Do not vigorously scrub. Keep the area clean and dry.   Things to look out for: increasing pain not relieved by ibuprofen/acetaminophen, fevers, spreading redness, drainage of pus, or foul odor.  Let us know if you need anything.

## 2023-01-12 NOTE — Progress Notes (Signed)
Chief Complaint  Patient presents with   Insect Bite    Jenna Campbell is a 72 y.o. female here for a skin complaint.  Duration: 2 days Location: R ankle Pruritic? Yes Painful? Yes Drainage? No New soaps/lotions/topicals/detergents? No Thinks she got bitten by something Other associated symptoms: swelling; no fevers Therapies tried thus far: hydrocortisone, TAO  Past Medical History:  Diagnosis Date   Acute bronchitis due to other specified organisms 04/21/2015   Acute hyponatremia 11/19/2020   Acute tension-type headache 12/16/2015   Adjustment disorder with anxious mood 04/21/2015   Allergic asthma with acute exacerbation 09/22/2014   Allergic rhinitis due to pollen 07/23/2014   Allergic urticaria 05/13/2015   Allergy    Alopecia 10/30/2013   Angina pectoris (HCC) 07/29/2019   Asthma    Atrophic vaginitis 10/30/2013   Basal cell carcinoma 04/21/2015   Bilateral impacted cerumen 04/21/2015   CAD (coronary artery disease) 10/28/2019   Cervical radiculopathy 09/17/2015   Change in bowel function 06/14/2016   Chest pain in adult 05/28/2017   Chest tightness 07/29/2019   Chronic diarrhea 12/16/2015   Colon polyps    Cough 02/14/2016   COVID-19 virus infection 11/19/2020   Cystocele, midline 10/30/2013   Dermatitis 09/22/2014   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   Diverticulosis of large intestine 10/30/2013   Dupuytren's contracture of both hands 09/17/2019   Dyspareunia 07/23/2014   Dysuria 12/16/2015   Encounter for long-term (current) use of other medications 10/19/2012   Esophageal dysphagia 06/14/2016   Essential hypertension    Family history of pheochromocytoma 02/14/2016   Foot pain, bilateral 01/05/2018   Gastroesophageal reflux disease    Gastroesophageal reflux disease without esophagitis 05/13/2015   Generalized abdominal pain 12/16/2015   GERD (gastroesophageal reflux disease)    H/O: hysterectomy    Herpes simplex 08/17/2016   History of adenomatous  polyp of colon 06/14/2016   History of blood transfusion 1970   Hx of migraines 05/15/2009   Hyperlipemia 12/10/2020   Hypothyroidism 10/30/2013   Hypotonic bladder 10/30/2013   Insomnia 10/11/2017   Localized edema 12/16/2015   Low back pain 09/17/2015   Lumbar paraspinal muscle spasm 04/21/2015   Major depression, recurrent (HCC) 07/14/2013   Major depressive disorder, recurrent, mild (HCC) 03/14/2017   Malaise and fatigue 04/21/2015   MCI (mild cognitive impairment) 08/18/2015   Memory difficulty 09/17/2015   Menopause 03/31/2014   Midline low back pain without sciatica 03/31/2014   Migraine    Mild intermittent extrinsic asthma 12/16/2015   Mild persistent asthma 05/13/2015   Mixed dyslipidemia 11/12/2019   Nausea vomiting and diarrhea 11/19/2020   Neck pain 09/17/2015   Neuropathy 09/22/2014   Nonintractable headache 11/02/2016   Other allergic rhinitis 05/13/2015   Palpitation 09/03/2017   Pedal edema 07/29/2019   Pelvic pain in female 02/17/2015   Personality disorder (HCC) 10/19/2012   Pharyngoesophageal dysphagia 04/21/2015   Plantar fasciitis 01/20/2016   Precordial chest pain 08/18/2015   Primary insomnia 10/11/2017   Prolapse of vaginal vault after hysterectomy 10/30/2013   Rectocele 10/30/2013   Seasonal allergies 10/15/2019   Senile purpura (HCC) 06/15/2020   Shin splint 04/21/2015   Shortness of breath 07/17/2016   Sinusitis, chronic 10/30/2013   Slow transit constipation 07/23/2014   SOB (shortness of breath) 11/19/2020   Stable angina 10/28/2019   Swelling of lower limb 05/17/2016   Tension-type headache, not intractable 09/17/2015   Thyroid disease    Tinnitus of both ears 10/11/2017   Unstable angina (HCC) 07/29/2019   Urethral  stricture 10/30/2013   Urinary frequency 02/23/2021   Urinary incontinence    Urinary incontinence without sensory awareness 07/17/2016   Varicose veins of both lower extremities 04/24/2016   Venous insufficiency  (chronic) (peripheral) 06/06/2016    BP 120/82 (BP Location: Left Arm, Patient Position: Sitting, Cuff Size: Normal)   Pulse 65   Temp 98.1 F (36.7 C) (Oral)   Ht 5\' 6"  (1.676 m)   Wt 128 lb (58.1 kg)   SpO2 97%   BMI 20.66 kg/m  Gen: awake, alert, appearing stated age Lungs: No accessory muscle use Skin: 2 excoriated papules over the antero-medial R ankle. +soft tiss swelling. No ttp or excessive warmth, spreading redness.  Psych: Age appropriate judgment and insight  Insect bite of right ankle, initial encounter - Plan: doxycycline (VIBRA-TABS) 100 MG tablet, fluconazole (DIFLUCAN) 150 MG tablet, hydrocortisone 2.5 % cream  Steroid cream as above. Doxy if no better/worsening after 2 d.  F/u prn. The patient voiced understanding and agreement to the plan.  Jilda Roche [place], DO 01/12/23 3:14 PM

## 2023-02-01 ENCOUNTER — Other Ambulatory Visit: Payer: Self-pay | Admitting: Family Medicine

## 2023-02-01 ENCOUNTER — Telehealth: Payer: Self-pay | Admitting: Family Medicine

## 2023-02-01 DIAGNOSIS — B009 Herpesviral infection, unspecified: Secondary | ICD-10-CM

## 2023-02-01 MED ORDER — VALACYCLOVIR HCL 1 G PO TABS
ORAL_TABLET | ORAL | 0 refills | Status: DC
Start: 2023-02-01 — End: 2023-12-28

## 2023-02-01 NOTE — Addendum Note (Signed)
Addended by: Maximino Sarin on: 02/01/2023 12:24 PM   Modules accepted: Orders

## 2023-02-01 NOTE — Telephone Encounter (Signed)
Rx sent 

## 2023-02-01 NOTE — Telephone Encounter (Signed)
Pt called back and stated the wrong dosage was sent in. She stated that she needs the 500 mg tablet that is supposed to be taken 2x/day for seven days. She mentioned that she received this 8/25. Please advise.

## 2023-02-01 NOTE — Telephone Encounter (Signed)
**  Pt stated that the 500mg  work better for her and are easier to swallow.**  Prescription Request  02/01/2023  Is this a "Controlled Substance" medicine? No  LOV: 01/12/2023  What is the name of the medication or equipment?   valACYclovir (VALTREX) 1000 MG tablet [[id number]]  Have you contacted your pharmacy to request a refill? Yes   Which pharmacy would you like this sent to?   [id number] [place], [place] - [address] [place] [address] Phone: [phone] Fax: [phone]  Patient notified that their request is being sent to the clinical staff for review and that they should receive a response within 2 business days.   Please advise at Mobile [phone] (mobile)

## 2023-02-02 NOTE — Telephone Encounter (Signed)
Patient informed. 

## 2023-02-22 ENCOUNTER — Ambulatory Visit: Payer: PPO

## 2023-02-22 DIAGNOSIS — Z0000 Encounter for general adult medical examination without abnormal findings: Secondary | ICD-10-CM

## 2023-02-22 NOTE — Patient Instructions (Addendum)
Jenna Campbell , Thank you for taking time to come for your Medicare Wellness Visit. I appreciate your ongoing commitment to your health goals. Please review the following plan we discussed and let me know if I can assist you in the future.   Referrals/Orders/Follow-Ups/Clinician Recommendations: none  This is a list of the screening recommended for you and due dates:  Health Maintenance  Topic Date Due   Colon Cancer Screening  05/03/2019   Flu Shot  12/14/2022   COVID-19 Vaccine (6 - 2023-24 season) 01/14/2023   Zoster (Shingles) Vaccine (1 of 2) 03/14/2023*   Medicare Annual Wellness Visit  02/22/2024   Mammogram  10/04/2024   DTaP/Tdap/Td vaccine (2 - Td or Tdap) 05/14/2031   Pneumonia Vaccine  Completed   DEXA scan (bone density measurement)  Completed   Hepatitis C Screening  Completed   HPV Vaccine  Aged Out  *Topic was postponed. The date shown is not the original due date.    Advanced directives: (ACP Link)Information on Advanced Care Planning can be found at [facility] Directives Advance Health Care Directives ([email or web address]   Next Medicare Annual Wellness Visit scheduled for next year: Yes   02/26/24 @ 9:00 am by video

## 2023-02-22 NOTE — Progress Notes (Signed)
Subjective:   Jenna Campbell is a 72 y.o. female who presents for Medicare Annual (Subsequent) preventive examination.  Visit Complete: Virtual I connected with  Jenna Campbell on 02/22/23 by a audio enabled telemedicine application and verified that I am speaking with the correct person using two identifiers.  Patient Location: Home  Provider Location: Office/Clinic  I discussed the limitations of evaluation and management by telemedicine. The patient expressed understanding and agreed to proceed.  Vital Signs: Because this visit was a virtual/telehealth visit, some criteria may be missing or patient reported. Any vitals not documented were not able to be obtained and vitals that have been documented are patient reported.   Cardiac Risk Factors include: advanced age (>47men, >70 women);hypertension     Objective:    There were no vitals filed for this visit. There is no height or weight on file to calculate BMI.     02/22/2023    8:40 AM 12/19/2021   11:46 AM 07/01/2021    2:32 PM 12/14/2020   11:45 AM 11/19/2020    8:39 PM 11/18/2020   11:51 PM 07/27/2019    6:24 PM  Advanced Directives  Does Patient Have a Medical Advance Directive? No Yes No Yes No No Yes  Type of Furniture conservator/restorer;Living will  Healthcare Power of [place];Living will   Healthcare Power of Attorney  Does patient want to make changes to medical advance directive?  No - Patient declined     No - Patient declined  Copy of Healthcare Power of Attorney in Chart?  No - copy requested  Yes - validated most recent copy scanned in chart (See row information)     Would patient like information on creating a medical advance directive? No - Patient declined  No - Patient declined  No - Patient declined      Current Medications (verified) Outpatient Encounter Medications as of 02/22/2023  Medication Sig   acetaminophen (TYLENOL) 500 MG tablet Take 500 mg by mouth every 6 (six) hours as needed  for moderate pain.   albuterol (VENTOLIN HFA) 108 (90 Base) MCG/ACT inhaler Inhale 2 puffs into the lungs every 4 (four) hours as needed for wheezing or shortness of breath.   ASPIRIN 81 PO Take 81 mg by mouth daily.    azelastine (ASTELIN) 0.1 % nasal spray Place 2 sprays into both nostrils 2 (two) times daily. Use in each nostril as directed   betamethasone valerate (VALISONE) 0.1 % cream APPLY TO AFFECTED AREA TWICE A DAY   Calcium-Phosphorus-Vitamin D (CALCIUM GUMMIES PO) Take by mouth.   cholestyramine (QUESTRAN) 4 GM/DOSE powder Take 1 packet (4 g total) by mouth 3 (three) times daily with meals.   Coenzyme Q10 (CO Q-10) 200 MG CAPS Take 200 mg by mouth daily.   fluticasone (FLONASE) 50 MCG/ACT nasal spray Place 2 sprays into both nostrils as needed for rhinitis or allergies.   fluticasone-salmeterol (ADVAIR DISKUS) 250-50 MCG/ACT AEPB TAKE 1 PUFF BY MOUTH TWICE A DAY   furosemide (LASIX) 40 MG tablet Take 1 tablet (40 mg total) by mouth daily.   gabapentin (NEURONTIN) 300 MG capsule Take 300 mg by mouth in the morning.   hydrocortisone 2.5 % cream Apply topically 2 (two) times daily.   ibuprofen (ADVIL) 200 MG tablet Take 400 mg by mouth every 6 (six) hours as needed for mild pain.   ipratropium-albuterol (DUONEB) 0.5-2.5 (3) MG/3ML SOLN Take 3 mLs by nebulization every 4 (four) hours as needed.  isosorbide mononitrate (IMDUR) 30 MG 24 hr tablet TAKE 1/2 TABLET BY MOUTH DAILY   levocetirizine (XYZAL) 5 MG tablet Take 5 mg by mouth daily.   levothyroxine (SYNTHROID) 150 MCG tablet TAKE 1.5 TABLETS (225 MCG TOTAL) BY MOUTH DAILY BEFORE BREAKFAST.   LORazepam (ATIVAN) 0.5 MG tablet Take 0.5 mg by mouth every 8 (eight) hours as needed for anxiety.    nitroGLYCERIN (NITROSTAT) 0.4 MG SL tablet Place 1 tablet (0.4 mg total) under the tongue every 5 (five) minutes as needed for chest pain.   pantoprazole (PROTONIX) 40 MG tablet TAKE 1 TABLET BY MOUTH TWICE A DAY   potassium chloride (KLOR-CON)  10 MEQ tablet TAKE 2 TABLETS WITH EVERY DOSE OF LASIX 40 MG.   temazepam (RESTORIL) 15 MG capsule Take 15 mg by mouth at bedtime.    topiramate (TOPAMAX) 100 MG tablet Take 200 mg by mouth at bedtime.   valACYclovir (VALTREX) 1000 MG tablet TAKE 1 TABLET BY MOUTH ONCE DAILY FOR 5 DAYS DURING OUTBREAKS.   venlafaxine XR (EFFEXOR XR) 75 MG 24 hr capsule Take 1 capsule (75 mg total) by mouth daily with breakfast.   cholecalciferol (VITAMIN D3) 25 MCG (1000 UNIT) tablet Take 1,000 Units by mouth daily. (Patient not taking: Reported on 02/22/2023)   fluconazole (DIFLUCAN) 150 MG tablet Take 1 tab, repeat in 72 hours if no improvement. (Patient not taking: Reported on 02/22/2023)   olopatadine (PATANOL) 0.1 % ophthalmic solution Place 1 drop into both eyes 2 (two) times daily.   No facility-administered encounter medications on file as of 02/22/2023.    Allergies (verified) Iodinated contrast media, Kenalog [triamcinolone acetonide], Metrizamide, Other, Penicillins, Lipitor [atorvastatin], Montelukast, Montelukast sodium, Pitavastatin, Rosuvastatin, Xyzal [levocetirizine], Ciprofloxacin, Diazepam, Latex, and Sulfa antibiotics   History: Past Medical History:  Diagnosis Date   Acute bronchitis due to other specified organisms 04/21/2015   Acute hyponatremia 11/19/2020   Acute tension-type headache 12/16/2015   Adjustment disorder with anxious mood 04/21/2015   Allergic asthma with acute exacerbation 09/22/2014   Allergic rhinitis due to pollen 07/23/2014   Allergic urticaria 05/13/2015   Allergy    Alopecia 10/30/2013   Angina pectoris (HCC) 07/29/2019   Asthma    Atrophic vaginitis 10/30/2013   Basal cell carcinoma 04/21/2015   Bilateral impacted cerumen 04/21/2015   CAD (coronary artery disease) 10/28/2019   Cervical radiculopathy 09/17/2015   Change in bowel function 06/14/2016   Chest pain in adult 05/28/2017   Chest tightness 07/29/2019   Chronic diarrhea 12/16/2015   Colon polyps     Cough 02/14/2016   COVID-19 virus infection 11/19/2020   Cystocele, midline 10/30/2013   Dermatitis 09/22/2014   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   Diverticulosis of large intestine 10/30/2013   Dupuytren's contracture of both hands 09/17/2019   Dyspareunia 07/23/2014   Dysuria 12/16/2015   Encounter for long-term (current) use of other medications 10/19/2012   Esophageal dysphagia 06/14/2016   Essential hypertension    Family history of pheochromocytoma 02/14/2016   Foot pain, bilateral 01/05/2018   Gastroesophageal reflux disease    Gastroesophageal reflux disease without esophagitis 05/13/2015   Generalized abdominal pain 12/16/2015   GERD (gastroesophageal reflux disease)    H/O: hysterectomy    Herpes simplex 08/17/2016   History of adenomatous polyp of colon 06/14/2016   History of blood transfusion 1970   Hx of migraines 05/15/2009   Hyperlipemia 12/10/2020   Hypothyroidism 10/30/2013   Hypotonic bladder 10/30/2013   Insomnia 10/11/2017   Localized edema 12/16/2015   Low  back pain 09/17/2015   Lumbar paraspinal muscle spasm 04/21/2015   Major depression, recurrent (HCC) 07/14/2013   Major depressive disorder, recurrent, mild (HCC) 03/14/2017   Malaise and fatigue 04/21/2015   MCI (mild cognitive impairment) 08/18/2015   Memory difficulty 09/17/2015   Menopause 03/31/2014   Midline low back pain without sciatica 03/31/2014   Migraine    Mild intermittent extrinsic asthma 12/16/2015   Mild persistent asthma 05/13/2015   Mixed dyslipidemia 11/12/2019   Nausea vomiting and diarrhea 11/19/2020   Neck pain 09/17/2015   Neuropathy 09/22/2014   Nonintractable headache 11/02/2016   Other allergic rhinitis 05/13/2015   Palpitation 09/03/2017   Pedal edema 07/29/2019   Pelvic pain in female 02/17/2015   Personality disorder (HCC) 10/19/2012   Pharyngoesophageal dysphagia 04/21/2015   Plantar fasciitis 01/20/2016   Precordial chest pain 08/18/2015   Primary insomnia  10/11/2017   Prolapse of vaginal vault after hysterectomy 10/30/2013   Rectocele 10/30/2013   Seasonal allergies 10/15/2019   Senile purpura (HCC) 06/15/2020   Shin splint 04/21/2015   Shortness of breath 07/17/2016   Sinusitis, chronic 10/30/2013   Slow transit constipation 07/23/2014   SOB (shortness of breath) 11/19/2020   Stable angina (HCC) 10/28/2019   Swelling of lower limb 05/17/2016   Tension-type headache, not intractable 09/17/2015   Thyroid disease    Tinnitus of both ears 10/11/2017   Unstable angina (HCC) 07/29/2019   Urethral stricture 10/30/2013   Urinary frequency 02/23/2021   Urinary incontinence    Urinary incontinence without sensory awareness 07/17/2016   Varicose veins of both lower extremities 04/24/2016   Venous insufficiency (chronic) (peripheral) 06/06/2016   Past Surgical History:  Procedure Laterality Date   BREAST CYST ASPIRATION Left    25 years ago    COLONOSCOPY  04/2016   [facility] Diverticulitis and Multiple colon polyps   CORONARY PRESSURE/FFR STUDY N/A 10/17/2019   Procedure: INTRAVASCULAR PRESSURE WIRE/FFR STUDY;  Surgeon: Yvonne Kendall, MD;  Location: [facility];  Service: Cardiovascular;  Laterality: N/A;   ESOPHAGOGASTRODUODENOSCOPY  04/2016   [facility]   LEFT HEART CATH AND CORONARY ANGIOGRAPHY N/A 10/17/2019   Procedure: LEFT HEART CATH AND CORONARY ANGIOGRAPHY;  Surgeon: Yvonne Kendall, MD;  Location: [facility];  Service: Cardiovascular;  Laterality: N/A;   MOHS SURGERY  10/05/2015   RECTOCELE REPAIR  2011   SKIN CANCER EXCISION  02/2015   Squamous cell removed from back   TONSILLECTOMY     VAGINAL HYSTERECTOMY     VAGINAL PROLAPSE REPAIR     Family History  Problem Relation Age of Onset   Asthma Father    Angina Father    Emphysema Father    Alzheimer's disease Mother    Cancer Sister        died of cancer   Colon cancer Neg Hx    Esophageal cancer Neg Hx    Social History   Socioeconomic  History   Marital status: Divorced    Spouse name: Not on file   Number of children: Not on file   Years of education: Not on file   Highest education level: Not on file  Occupational History   Not on file  Tobacco Use   Smoking status: Never   Smokeless tobacco: Never  Vaping Use   Vaping status: Never Used  Substance and Sexual Activity   Alcohol use: Not Currently   Drug use: No   Sexual activity: Never    Partners: Male  Other Topics Concern   Not on file  Social History Narrative   Not on file   Social Determinants of Health   Financial Resource Strain: Low Risk  (02/22/2023)   Overall Financial Resource Strain (CARDIA)    Difficulty of Paying Living Expenses: Not hard at all  Food Insecurity: No Food Insecurity (02/22/2023)   Hunger Vital Sign    Worried About Running Out of Food in the Last Year: Never true    Ran Out of Food in the Last Year: Never true  Transportation Needs: No Transportation Needs (02/22/2023)   PRAPARE - Administrator, Civil Service (Medical): No    Lack of Transportation (Non-Medical): No  Physical Activity: Sufficiently Active (02/22/2023)   Exercise Vital Sign    Days of Exercise per Week: 7 days    Minutes of Exercise per Session: 30 min  Stress: No Stress Concern Present (02/22/2023)   [employer] of Occupational Health - Occupational Stress Questionnaire    Feeling of Stress : Only a little  Social Connections: Moderately Integrated (02/22/2023)   Social Connection and Isolation Panel [NHANES]    Frequency of Communication with Friends and Family: Three times a week    Frequency of Social Gatherings with Friends and Family: Once a week    Attends Religious Services: More than 4 times per year    Active Member of [employer] or Organizations: Yes    Attends Engineer, structural: More than 4 times per year    Marital Status: Divorced    Tobacco Counseling Counseling given: Not Answered   Clinical  Intake:  Pre-visit preparation completed: Yes  Pain : No/denies pain     Nutritional Status: BMI of 19-24  Normal Nutritional Risks: None Diabetes: No  How often do you need to have someone help you when you read instructions, pamphlets, or other written materials from your doctor or pharmacy?: 1 - Never  Interpreter Needed?: No  Information entered by :: Kennedy Bucker, LPN   Activities of Daily Living    02/22/2023    8:41 AM  In your present state of health, do you have any difficulty performing the following activities:  Hearing? 0  Vision? 0  Difficulty concentrating or making decisions? 0  Walking or climbing stairs? 0  Dressing or bathing? 0  Doing errands, shopping? 0  Preparing Food and eating ? N  Using the Toilet? N  In the past six months, have you accidently leaked urine? N  Do you have problems with loss of bowel control? N  Managing your Medications? N  Managing your Finances? N  Housekeeping or managing your Housekeeping? N    Patient Care Team: Sharlene Dory, DO as PCP - General (Family Medicine) Revankar, Aundra Dubin, MD as PCP - Cardiology (Cardiology)  Indicate any recent Medical Services you may have received from other than [facility] providers in the past year (date may be approximate).     Assessment:   This is a routine wellness examination for Kayline.  Hearing/Vision screen Hearing Screening - Comments:: No aids Vision Screening - Comments:: Wears glasses- [facility]    Goals Addressed             This Visit's Progress    DIET - EAT MORE FRUITS AND VEGETABLES         Depression Screen    02/22/2023    8:36 AM 09/12/2022    1:34 PM 06/12/2022    9:45 AM 12/19/2021   11:51  AM 12/19/2021   11:49 AM 02/23/2021   10:19 AM 12/14/2020   11:51 AM  PHQ 2/9 Scores  PHQ - 2 Score 0 0 0 0 0 0 0  PHQ- 9 Score 0 0 0        Fall Risk    02/22/2023    8:41 AM 09/12/2022    1:34 PM 06/12/2022    9:45 AM 12/19/2021   11:49 AM  02/23/2021   10:19 AM  Fall Risk   Falls in the past year? 0 0 0 0 0  Number falls in past yr: 0 0 0 0 0  Injury with Fall? 0 0 0 0 0  Risk for fall due to : No Fall Risks No Fall Risks No Fall Risks No Fall Risks   Follow up Falls prevention discussed;Falls evaluation completed Falls evaluation completed Falls evaluation completed Falls evaluation completed     MEDICARE RISK AT HOME: Medicare Risk at Home Any stairs in or around the home?: No If so, are there any without handrails?: No Home free of loose throw rugs in walkways, pet beds, electrical cords, etc?: Yes Adequate lighting in your home to reduce risk of falls?: Yes Life alert?: No Use of a cane, walker or w/c?: Yes (walker) Grab bars in the bathroom?: Yes Shower chair or bench in shower?: Yes Elevated toilet seat or a handicapped toilet?: Yes  TIMED UP AND GO:  Was the test performed?  No    Cognitive Function:    07/05/2017    2:31 PM  MMSE - Mini Mental State Exam  Orientation to time 5  Orientation to Place 5  Registration 3  Attention/ Calculation 5  Recall 3  Language- name 2 objects 2  Language- repeat 1  Language- follow 3 step command 3  Language- read & follow direction 1  Write a sentence 1  Copy design 1  Total score 30        02/22/2023    8:43 AM 12/19/2021   12:04 PM  6CIT Screen  What Year? 0 points 0 points  What month? 0 points 0 points  What time? 0 points 0 points  Count back from 20 0 points 0 points  Months in reverse 0 points 0 points  Repeat phrase 2 points 0 points  Total Score 2 points 0 points    Immunizations Immunization History  Administered Date(s) Administered   Influenza, High Dose Seasonal PF 03/01/2017, 02/28/2018, 03/17/2021   Influenza,inj,Quad PF,6+ Mos 04/16/2019   Influenza,inj,quad, With Preservative 02/24/2014   Influenza-Unspecified 02/25/2013, 02/25/2013, 02/24/2014, 01/25/2015, 01/25/2015, 06/30/2016, 06/30/2016, 03/01/2017, 02/28/2018, 03/17/2021    PFIZER Comirnaty(Gray Top)Covid-19 Tri-Sucrose Vaccine 06/26/2019, 07/21/2019   PFIZER(Purple Top)SARS-COV-2 Vaccination 06/26/2019, 07/21/2019, 03/01/2020   PNEUMOCOCCAL CONJUGATE-20 05/13/2021   Pneumococcal Polysaccharide-23 12/22/2014   Tdap 05/13/2021    TDAP status: Up to date  Flu Vaccine status: Due, Education has been provided regarding the importance of this vaccine. Advised may receive this vaccine at local pharmacy or Health Dept. Aware to provide a copy of the vaccination record if obtained from local pharmacy or Health Dept. Verbalized acceptance and understanding.  Pneumococcal vaccine status: Up to date  Covid-19 vaccine status: Completed vaccines  Qualifies for Shingles Vaccine? Yes   Zostavax completed No   Shingrix Completed?: No.    Education has been provided regarding the importance of this vaccine. Patient has been advised to call insurance company to determine out of pocket expense if they have not yet received this vaccine. Advised may also  receive vaccine at local pharmacy or Health Dept. Verbalized acceptance and understanding.  Screening Tests Health Maintenance  Topic Date Due   Colonoscopy  05/03/2019   INFLUENZA VACCINE  12/14/2022   COVID-19 Vaccine (6 - 2023-24 season) 01/14/2023   Zoster Vaccines- Shingrix (1 of 2) 03/14/2023 (Originally 02/06/1970)   Medicare Annual Wellness (AWV)  02/22/2024   MAMMOGRAM  10/04/2024   DTaP/Tdap/Td (2 - Td or Tdap) 05/14/2031   Pneumonia Vaccine 64+ Years old  Completed   DEXA SCAN  Completed   Hepatitis C Screening  Completed   HPV VACCINES  Aged Out    Health Maintenance  Health Maintenance Due  Topic Date Due   Colonoscopy  05/03/2019   INFLUENZA VACCINE  12/14/2022   COVID-19 Vaccine (6 - 2023-24 season) 01/14/2023    Colorectal cancer screening: Type of screening: Colonoscopy. Completed 05/02/16. Repeat every 3 years- declined referral  Mammogram status: Completed 10/05/22. Repeat every year  Bone  Density status: Completed 09/21/20. Results reflect: Bone density results: OSTEOPENIA. Repeat every 5 years.  Lung Cancer Screening: (Low Dose CT Chest recommended if Age 50-80 years, 20 pack-year currently smoking OR have quit w/in 15years.) does not qualify.     Additional Screening:  Hepatitis C Screening: does qualify; Completed 05/13/09  Vision Screening: Recommended annual ophthalmology exams for early detection of glaucoma and other disorders of the eye. Is the patient up to date with their annual eye exam?  Yes  Who is the provider or what is the name of the office in which the patient attends annual eye exams? [facility] If pt is not established with a provider, would they like to be referred to a provider to establish care? No .   Dental Screening: Recommended annual dental exams for proper oral hygiene   Community Resource Referral / Chronic Care Management: CRR required this visit?  No   CCM required this visit?  No     Plan:     I have personally reviewed and noted the following in the patient's chart:   Medical and social history Use of alcohol, tobacco or illicit drugs  Current medications and supplements including opioid prescriptions. Patient is not currently taking opioid prescriptions. Functional ability and status Nutritional status Physical activity Advanced directives List of other physicians Hospitalizations, surgeries, and ER visits in previous 12 months Vitals Screenings to include cognitive, depression, and falls Referrals and appointments  In addition, I have reviewed and discussed with patient certain preventive protocols, quality metrics, and best practice recommendations. A written personalized care plan for preventive services as well as general preventive health recommendations were provided to patient.    Hal Hope, LPN   [id number]   After Visit Summary: (MyChart) Due to this being a telephonic visit, the after visit summary  with patients personalized plan was offered to patient via MyChart   Nurse Notes: none

## 2023-02-26 ENCOUNTER — Encounter: Payer: Self-pay | Admitting: Family Medicine

## 2023-02-27 ENCOUNTER — Other Ambulatory Visit: Payer: Self-pay | Admitting: Family Medicine

## 2023-02-27 DIAGNOSIS — M7989 Other specified soft tissue disorders: Secondary | ICD-10-CM

## 2023-02-28 ENCOUNTER — Ambulatory Visit: Payer: PPO | Admitting: Family Medicine

## 2023-02-28 ENCOUNTER — Encounter: Payer: Self-pay | Admitting: Family Medicine

## 2023-02-28 VITALS — BP 128/70 | HR 52 | Temp 98.0°F | Ht 66.0 in | Wt 129.0 lb

## 2023-02-28 DIAGNOSIS — I209 Angina pectoris, unspecified: Secondary | ICD-10-CM

## 2023-02-28 DIAGNOSIS — B3731 Acute candidiasis of vulva and vagina: Secondary | ICD-10-CM | POA: Diagnosis not present

## 2023-02-28 DIAGNOSIS — R300 Dysuria: Secondary | ICD-10-CM

## 2023-02-28 LAB — POC URINALSYSI DIPSTICK (AUTOMATED)
Bilirubin, UA: NEGATIVE
Blood, UA: NEGATIVE
Glucose, UA: NEGATIVE
Ketones, UA: NEGATIVE
Nitrite, UA: NEGATIVE
Protein, UA: NEGATIVE
Spec Grav, UA: 1.01 (ref 1.010–1.025)
Urobilinogen, UA: 0.2 U/dL
pH, UA: 6 (ref 5.0–8.0)

## 2023-02-28 MED ORDER — NITROFURANTOIN MONOHYD MACRO 100 MG PO CAPS
100.0000 mg | ORAL_CAPSULE | Freq: Two times a day (BID) | ORAL | 0 refills | Status: AC
Start: 2023-02-28 — End: 2023-03-05

## 2023-02-28 MED ORDER — FLUCONAZOLE 150 MG PO TABS
ORAL_TABLET | ORAL | 0 refills | Status: DC
Start: 2023-02-28 — End: 2023-03-09

## 2023-02-28 NOTE — Progress Notes (Signed)
Chief Complaint  Patient presents with   Dysuria    Jenna Campbell is a 72 y.o. female here for possible UTI.  Duration: 1 week Symptoms: Dysuria, abd pain, retention, hesitancy  Denies: urinary frequency, hematuria, fever, nausea, vomiting, flank pain, vaginal discharge Hx of recurrent UTI? No Denies new sexual partners.  Has been having anginal chest pain. Requesting doc in [place] for cardiology. Nitroglycerin is helpful, ut she is requiring a 2nd and sometimes 3rd tab to resolve her pain. Has associated LUE numbness.   Past Medical History:  Diagnosis Date   Acute bronchitis due to other specified organisms 04/21/2015   Acute hyponatremia 11/19/2020   Acute tension-type headache 12/16/2015   Adjustment disorder with anxious mood 04/21/2015   Allergic asthma with acute exacerbation 09/22/2014   Allergic rhinitis due to pollen 07/23/2014   Allergic urticaria 05/13/2015   Allergy    Alopecia 10/30/2013   Angina pectoris (HCC) 07/29/2019   Asthma    Atrophic vaginitis 10/30/2013   Basal cell carcinoma 04/21/2015   Bilateral impacted cerumen 04/21/2015   CAD (coronary artery disease) 10/28/2019   Cervical radiculopathy 09/17/2015   Change in bowel function 06/14/2016   Chest pain in adult 05/28/2017   Chest tightness 07/29/2019   Chronic diarrhea 12/16/2015   Colon polyps    Cough 02/14/2016   COVID-19 virus infection 11/19/2020   Cystocele, midline 10/30/2013   Dermatitis 09/22/2014   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   Diverticulosis of large intestine 10/30/2013   Dupuytren's contracture of both hands 09/17/2019   Dyspareunia 07/23/2014   Dysuria 12/16/2015   Encounter for long-term (current) use of other medications 10/19/2012   Esophageal dysphagia 06/14/2016   Essential hypertension    Family history of pheochromocytoma 02/14/2016   Foot pain, bilateral 01/05/2018   Gastroesophageal reflux disease    Gastroesophageal reflux disease without esophagitis  05/13/2015   Generalized abdominal pain 12/16/2015   GERD (gastroesophageal reflux disease)    H/O: hysterectomy    Herpes simplex 08/17/2016   History of adenomatous polyp of colon 06/14/2016   History of blood transfusion 1970   Hx of migraines 05/15/2009   Hyperlipemia 12/10/2020   Hypothyroidism 10/30/2013   Hypotonic bladder 10/30/2013   Insomnia 10/11/2017   Localized edema 12/16/2015   Low back pain 09/17/2015   Lumbar paraspinal muscle spasm 04/21/2015   Major depression, recurrent (HCC) 07/14/2013   Major depressive disorder, recurrent, mild (HCC) 03/14/2017   Malaise and fatigue 04/21/2015   MCI (mild cognitive impairment) 08/18/2015   Memory difficulty 09/17/2015   Menopause 03/31/2014   Midline low back pain without sciatica 03/31/2014   Migraine    Mild intermittent extrinsic asthma 12/16/2015   Mild persistent asthma 05/13/2015   Mixed dyslipidemia 11/12/2019   Nausea vomiting and diarrhea 11/19/2020   Neck pain 09/17/2015   Neuropathy 09/22/2014   Nonintractable headache 11/02/2016   Other allergic rhinitis 05/13/2015   Palpitation 09/03/2017   Pedal edema 07/29/2019   Pelvic pain in female 02/17/2015   Personality disorder (HCC) 10/19/2012   Pharyngoesophageal dysphagia 04/21/2015   Plantar fasciitis 01/20/2016   Precordial chest pain 08/18/2015   Primary insomnia 10/11/2017   Prolapse of vaginal vault after hysterectomy 10/30/2013   Rectocele 10/30/2013   Seasonal allergies 10/15/2019   Senile purpura (HCC) 06/15/2020   Shin splint 04/21/2015   Shortness of breath 07/17/2016   Sinusitis, chronic 10/30/2013   Slow transit constipation 07/23/2014   SOB (shortness of breath) 11/19/2020   Stable angina (HCC) 10/28/2019   Swelling of  lower limb 05/17/2016   Tension-type headache, not intractable 09/17/2015   Thyroid disease    Tinnitus of both ears 10/11/2017   Unstable angina (HCC) 07/29/2019   Urethral stricture 10/30/2013   Urinary frequency  02/23/2021   Urinary incontinence    Urinary incontinence without sensory awareness 07/17/2016   Varicose veins of both lower extremities 04/24/2016   Venous insufficiency (chronic) (peripheral) 06/06/2016     BP 128/70 (BP Location: Left Arm, Patient Position: Sitting, Cuff Size: Normal)   Pulse (!) 52   Temp 98 F (36.7 C) (Oral)   Ht 5\' 6"  (1.676 m)   Wt 129 lb (58.5 kg)   SpO2 98%   BMI 20.82 kg/m  General: Awake, alert, appears stated age Heart: RRR Lungs: CTAB, normal respiratory effort, no accessory muscle usage Abd: BS+, soft, NT, ND, no masses or organomegaly MSK: No CVA tenderness, neg Lloyd's sign Psych: Age appropriate judgment and insight  Dysuria - Plan: POCT Urinalysis Dipstick (Automated), Urine Culture, nitrofurantoin, macrocrystal-monohydrate, (MACROBID) 100 MG capsule  Yeast vaginitis - Plan: fluconazole (DIFLUCAN) 150 MG tablet  Angina pectoris (HCC) - Plan: Ambulatory referral to Cardiology  1/2. Will tx for UTI, UA suggestive of infection w LE's. Stay hydrated. Seek immediate care if pt starts to develop fevers, new/worsening symptoms, uncontrollable N/V. Diflucan prn.  3. Refer to cards in [place] as it is closer to her residence. Would like her seen relatively soon. If not able to get in, will see if I am able to order a stress echo and consider a longer acting nitrate if no signs of new ischemia.  F/u prn. The patient voiced understanding and agreement to the plan.  Jilda Roche Sammy Martinez, DO 02/28/23 3:51 PM

## 2023-02-28 NOTE — Patient Instructions (Signed)
Stay hydrated.   Warning signs/symptoms: Uncontrollable nausea/vomiting, fevers, worsening symptoms despite treatment, confusion.  Give [place] around 2 business days to get culture back to you.  Let us know if you need anything.

## 2023-03-01 LAB — URINE CULTURE
MICRO NUMBER:: 15602742
Result:: NO GROWTH
SPECIMEN QUALITY:: ADEQUATE

## 2023-03-05 ENCOUNTER — Encounter: Payer: Self-pay | Admitting: Family Medicine

## 2023-03-07 DIAGNOSIS — F329 Major depressive disorder, single episode, unspecified: Secondary | ICD-10-CM | POA: Diagnosis not present

## 2023-03-08 NOTE — Progress Notes (Signed)
Cardiology Office Note:    Date:  03/09/2023   ID:  Jenna Campbell, DOB 1950/08/09, MRN [id number]  PCP:  Sharlene Dory, DO  Cardiologist:  Norman Herrlich, MD    Referring MD: Sharlene Dory*    ASSESSMENT:    1. Coronary artery disease involving native coronary artery of native heart without angina pectoris   2. Angina pectoris (HCC)   3. Essential hypertension   4. Mixed hyperlipidemia   5. Statin intolerance    PLAN:    In order of problems listed above:  Known CAD moderate to severe complex LAD ostial proximal stenosis extending into left main coronary artery with a change in clinical pattern I think she should be referred back to coronary angiography increase her oral nitrate for avoiding beta-blockers with bradycardia and initiate Zetia therapy as she is statin intolerant Stable hypertension   Next appointment: 3 months   Medication Adjustments/Labs and Tests Ordered: Current medicines are reviewed at length with the patient today.  Concerns regarding medicines are outlined above.  Orders Placed This Encounter  Procedures   EKG 12-Lead   No orders of the defined types were placed in this encounter.    History of Present Illness:    Jenna Campbell is a 72 y.o. female with a hx of CAD hypertension and dyslipidemia last seen 07/01/2021 by Dr. Tomie China. She was seen by her PCP 02/28/2019 for having breakthrough anginal discomfort and is referred back to cardiology. Left heart catheterization 10/17/2019 showed moderate 50 to 60% ostial proximal LAD stenosis extending at the distal left main coronary artery with normal FFR.  Ongoing medical therapy was advised. Compliance with diet, lifestyle and medications: Yes  It is very nice to see Jenna Campbell again I had seen her years ago and her initial evaluation Cardiac CTA showed what appeared to be severe ostial LAD disease with diminished FFR Cardiac cath as detailed above  There is a lot of stressors  in her life but regardless she is not having frequent exertional angina several times a week requiring nitroglycerin the pattern is predictable and it happens after she does her walking and is associated with stress at her independent living facility He is statin intolerant taking Questran I have convinced her to try Zetia There  is a history of dye allergy but did not have a problem with either the CTA of the cath with dye prep The experience initially when she had a heart cath was quite distressing for her from her description she became very breathless and frightened during the procedure?  Associated with adenosine  Knowing her anatomy and her clinical change in pattern I think she should undergo repeat coronary angiography and very likely requires revascularization  She agrees Past Medical History:  Diagnosis Date   Acute bronchitis due to other specified organisms 04/21/2015   Acute hyponatremia 11/19/2020   Acute tension-type headache 12/16/2015   Adjustment disorder with anxious mood 04/21/2015   Allergic asthma with acute exacerbation 09/22/2014   Allergic rhinitis due to pollen 07/23/2014   Allergic urticaria 05/13/2015   Allergy    Alopecia 10/30/2013   Angina pectoris (HCC) 07/29/2019   Arthritis of carpometacarpal (CMC) joint of left thumb 01/10/2022   Asthma    Atrophic vaginitis 10/30/2013   Basal cell carcinoma 04/21/2015   Bilateral impacted cerumen 04/21/2015   CAD (coronary artery disease) 10/28/2019   Cervical radiculopathy 09/17/2015   Change in bowel function 06/14/2016   Chest pain in adult 05/28/2017   Chest  tightness 07/29/2019   Chronic diarrhea 12/16/2015   Colon polyps    Cough 02/14/2016   COVID-19 virus infection 11/19/2020   Cystocele, midline 10/30/2013   Dermatitis 09/22/2014   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   Diverticulosis of large intestine 10/30/2013   Drug-induced myopathy 09/12/2022   Dupuytren's contracture of both hands 09/17/2019    Dyspareunia 07/23/2014   Dysuria 12/16/2015   Encounter for long-term (current) use of other medications 10/19/2012   Esophageal dysphagia 06/14/2016   Essential hypertension    Family history of pheochromocytoma 02/14/2016   Foot pain, bilateral 01/05/2018   Gastroesophageal reflux disease    Gastroesophageal reflux disease without esophagitis 05/13/2015   Generalized abdominal pain 12/16/2015   Generalized anxiety disorder 10/21/2021   GERD (gastroesophageal reflux disease)    H/O: hysterectomy    Herpes simplex 08/17/2016   History of adenomatous polyp of colon 06/14/2016   History of blood transfusion 1970   Hx of migraines 05/15/2009   Hyperlipemia 12/10/2020   Hypothyroidism 10/30/2013   Hypotonic bladder 10/30/2013   Insomnia 10/11/2017   Localized edema 12/16/2015   Low back pain 09/17/2015   Lumbar paraspinal muscle spasm 04/21/2015   Major depression, recurrent (HCC) 07/14/2013   Major depressive disorder, recurrent, mild (HCC) 03/14/2017   Malaise and fatigue 04/21/2015   MCI (mild cognitive impairment) 08/18/2015   Memory difficulty 09/17/2015   Menopause 03/31/2014   Midline low back pain without sciatica 03/31/2014   Migraine    Mild intermittent extrinsic asthma 12/16/2015   Mild persistent asthma 05/13/2015   Mixed dyslipidemia 11/12/2019   Nausea vomiting and diarrhea 11/19/2020   Neck pain 09/17/2015   Neuropathy 09/22/2014   Neutropenia, unspecified type (HCC) 07/07/2021   Nonintractable headache 11/02/2016   Other allergic rhinitis 05/13/2015   Palpitation 09/03/2017   Pedal edema 07/29/2019   Pelvic pain in female 02/17/2015   Personality disorder (HCC) 10/19/2012   Pharyngoesophageal dysphagia 04/21/2015   Plantar fasciitis 01/20/2016   Precordial chest pain 08/18/2015   Primary insomnia 10/11/2017   Prolapse of vaginal vault after hysterectomy 10/30/2013   Rectocele 10/30/2013   Seasonal allergies 10/15/2019   Senile purpura (HCC) 06/15/2020    Shin splint 04/21/2015   Shortness of breath 07/17/2016   Sinusitis, chronic 10/30/2013   Slow transit constipation 07/23/2014   SOB (shortness of breath) 11/19/2020   Stable angina (HCC) 10/28/2019   Swelling of lower limb 05/17/2016   Tension-type headache, not intractable 09/17/2015   Thyroid disease    Tinnitus of both ears 10/11/2017   Unstable angina (HCC) 07/29/2019   Urethral stricture 10/30/2013   Urinary frequency 02/23/2021   Urinary incontinence    Urinary incontinence without sensory awareness 07/17/2016   Varicose veins of both lower extremities 04/24/2016   Venous insufficiency (chronic) (peripheral) 06/06/2016    Current Medications: Current Meds  Medication Sig   acetaminophen (TYLENOL) 500 MG tablet Take 500 mg by mouth every 6 (six) hours as needed for moderate pain.   albuterol (VENTOLIN HFA) 108 (90 Base) MCG/ACT inhaler Inhale 2 puffs into the lungs every 4 (four) hours as needed for wheezing or shortness of breath.   ASPIRIN 81 PO Take 81 mg by mouth daily.    azelastine (ASTELIN) 0.1 % nasal spray Place 2 sprays into both nostrils 2 (two) times daily. Use in each nostril as directed   betamethasone valerate (VALISONE) 0.1 % cream APPLY TO AFFECTED AREA TWICE A DAY   Calcium-Phosphorus-Vitamin D (CALCIUM GUMMIES PO) Take 1 Piece of gum by mouth  daily.   cholecalciferol (VITAMIN D3) 25 MCG (1000 UNIT) tablet Take 1,000 Units by mouth daily.   cholestyramine (QUESTRAN) 4 GM/DOSE powder Take 1 packet (4 g total) by mouth 3 (three) times daily with meals.   Coenzyme Q10 (CO Q-10) 200 MG CAPS Take 200 mg by mouth daily.   fluconazole (DIFLUCAN) 150 MG tablet Take 1 tab, repeat in 72 hours if no improvement.   fluticasone (FLONASE) 50 MCG/ACT nasal spray Place 2 sprays into both nostrils as needed for rhinitis or allergies.   fluticasone-salmeterol (ADVAIR DISKUS) 250-50 MCG/ACT AEPB TAKE 1 PUFF BY MOUTH TWICE A DAY   furosemide (LASIX) 40 MG tablet Take 1 tablet  (40 mg total) by mouth daily.   gabapentin (NEURONTIN) 300 MG capsule Take 300 mg by mouth in the morning.   hydrocortisone 2.5 % cream Apply topically 2 (two) times daily.   ibuprofen (ADVIL) 200 MG tablet Take 400 mg by mouth every 6 (six) hours as needed for mild pain.   ipratropium-albuterol (DUONEB) 0.5-2.5 (3) MG/3ML SOLN Take 3 mLs by nebulization every 4 (four) hours as needed.   isosorbide mononitrate (IMDUR) 30 MG 24 hr tablet TAKE 1/2 TABLET BY MOUTH DAILY   levocetirizine (XYZAL) 5 MG tablet Take 5 mg by mouth daily.   levothyroxine (SYNTHROID) 150 MCG tablet TAKE 1.5 TABLETS (225 MCG TOTAL) BY MOUTH DAILY BEFORE BREAKFAST.   LORazepam (ATIVAN) 0.5 MG tablet Take 0.5 mg by mouth every 8 (eight) hours as needed for anxiety.    nitroGLYCERIN (NITROSTAT) 0.4 MG SL tablet Place 1 tablet (0.4 mg total) under the tongue every 5 (five) minutes as needed for chest pain.   olopatadine (PATANOL) 0.1 % ophthalmic solution Place 1 drop into both eyes 2 (two) times daily.   pantoprazole (PROTONIX) 40 MG tablet TAKE 1 TABLET BY MOUTH TWICE A DAY   potassium chloride (KLOR-CON) 10 MEQ tablet TAKE 2 TABLETS WITH EVERY DOSE OF LASIX 40 MG.   temazepam (RESTORIL) 15 MG capsule Take 15 mg by mouth at bedtime.    topiramate (TOPAMAX) 100 MG tablet Take 200 mg by mouth at bedtime.   valACYclovir (VALTREX) 1000 MG tablet TAKE 1 TABLET BY MOUTH ONCE DAILY FOR 5 DAYS DURING OUTBREAKS.      EKGs/Labs/Other Studies Reviewed:    The following studies were reviewed today:  Cardiac Studies & Procedures   CARDIAC CATHETERIZATION  CARDIAC CATHETERIZATION 10/17/2019  Narrative Conclusions: 1. Single vessel coronary artery disease with 50-60% ostial/proximal LAD stenosis extending back into the distal LMCA.  The lesion is not hemodynamically significant (DFR = 0.94; FFR = 0.88). 2. No significant disease involving LCx or RCA. 3. Normal left ventricular contraction and filling  pressure.  Recommendations: 1. Medical therapy, including addition of isosorbide mononitrate and escalation of statin therapy as tolerated.  Yvonne Kendall, MD [facility]  Findings Coronary Findings Diagnostic  Dominance: Right  Left Main Vessel is large. Dist LM lesion is 20% stenosed.  Left Anterior Descending Ost LAD lesion is 55% stenosed. Pressure wire/FFR was performed on the lesion. DFR = 0.94.  FFR 0.88.  First Diagonal Branch Vessel is small in size.  Second Diagonal Branch Vessel is moderate in size.  Third Diagonal Branch Vessel is small in size.  Left Circumflex Vessel is large. Vessel is angiographically normal.  First Obtuse Marginal Branch Vessel is moderate in size.  Second Obtuse Marginal Branch Vessel is moderate in size.  Third Obtuse Marginal Branch Vessel is small in size.  Right Coronary Artery  Vessel is large. Vessel is angiographically normal.  Right Posterior Descending Artery Vessel is large in size.  Right Posterior Atrioventricular Artery Vessel is moderate in size.  Intervention  No interventions have been documented.   STRESS TESTS  MYOCARDIAL PERFUSION IMAGING 03/15/2017  Narrative  Nuclear stress EF: 56%.  Probable normal perfusion and soft tissue attenuation (breast) No ischemia  This is a low risk study.     MONITORS  LONG TERM MONITOR (3-14 DAYS) 08/16/2021  Narrative Patch Wear Time:  14 days and 0 hours (2023-03-09T08:54:18-0500 to 2023-03-23T09:54:22-0400)  Patient had a min HR of 40 bpm, max HR of 164 bpm, and avg HR of 60 bpm.  Predominant underlying rhythm was Sinus Rhythm. 22 Supraventricular Tachycardia runs occurred, the run with the fastest interval lasting 5 beats with a max rate of 164 bpm, the longest lasting 13.7 secs with an avg rate of 105 bpm. Isolated SVEs were rare (<1.0%), SVE Couplets were rare (<1.0%), and SVE Triplets were rare (<1.0%).  Isolated VEs were rare (<1.0%), and no VE  Couplets or VE Triplets were present.  Impression: Mildly abnormal but largely unremarkable event monitor.  Patient's symptoms did not correlate with any findings on the monitoring.  Rare brief atrial runs.   CT SCANS  CT CORONARY MORPH W/CTA COR W/SCORE 10/06/2019  Addendum 10/06/2019  5:01 PM ADDENDUM REPORT: 10/06/2019 16:59  CLINICAL DATA:  72 yo female with chest pain  EXAM: Cardiac/Coronary  CT  TECHNIQUE: The patient was scanned on a [employer].  FINDINGS: A 120 kV prospective scan was triggered in the descending thoracic aorta at 111 HU's. Axial non-contrast 3 mm slices were carried out through the heart. The data set was analyzed on a dedicated work station and scored using the Agatson method. Gantry rotation speed was 250 msecs and collimation was .6 mm. No beta blockade and 0.8 mg of sl NTG was given. The 3D data set was reconstructed in 5% intervals of the 67-82 % of the R-R cycle. Diastolic phases were analyzed on a dedicated work station using MPR, MIP and VRT modes. The patient received 80 cc of contrast.  Aorta:  Normal size.  Mild aortic plaque.  No dissection.  Aortic Valve:  Trileaflet.  No calcifications.  Coronary Arteries:  Normal coronary origin.  Right dominance.  RCA is a large dominant artery that gives rise to PDA. There is no plaque.  Left main is a large artery that gives rise to LAD and LCX arteries.  LAD is a large vessel that gives rise to medium D1 and D2 and small D3; there is severe (70-99%) stenosis (soft plaque) in the proximal vessel.  LCX is a non-dominant artery that gives rise to large OM1 branch and large OM2 branch. There is no plaque.  Other findings:  Normal pulmonary vein drainage into the left atrium.  Normal let atrial appendage without a thrombus.  Normal size of the pulmonary artery.  IMPRESSION: 1. Coronary calcium score of 0. This was 0 percentile for age and sex matched control.  2. Normal  coronary origin with right dominance.  3. Severe (70-99%) stenosis (soft plaque) in the proximal LAD; CAD-RADS 4. Study will be sent for FFR.  4. Results called to Dr Tomie China.  Olga Millers   Electronically Signed By: Olga Millers M.D. On: 10/06/2019 16:59  Narrative EXAM: OVER-READ INTERPRETATION  CT CHEST  The following report is an over-read performed by radiologist Dr. Charlett Nose of [facility] on 10/06/2019. This over-read  does not include interpretation of cardiac or coronary anatomy or pathology. The coronary CTA interpretation by the cardiologist is attached.  COMPARISON:  None.  FINDINGS: Vascular: Heart is normal size.  Aorta normal caliber.  Mediastinum/Nodes: No adenopathy  Lungs/Pleura: No confluent opacities or effusions.  Upper Abdomen: Imaging into the upper abdomen shows no acute findings.  Musculoskeletal: Chest wall soft tissues are unremarkable. No acute bony abnormality.  IMPRESSION: No acute or significant extracardiac abnormality.  Electronically Signed: By: Charlett Nose M.D. On: 10/06/2019 10:40              Recent Labs: 12/11/2022: Magnesium 2.0 12/25/2022: ALT 13; BUN 15; Creatinine, Ser 0.74; Hemoglobin 12.6; Platelets 279.0; Potassium 3.7; Sodium 134  Recent Lipid Panel    Component Value Date/Time   CHOL 173 09/12/2022 1424   CHOL 164 12/13/2020 0953   TRIG 157.0 (H) 09/12/2022 1424   HDL 39.60 09/12/2022 1424   HDL 39 (L) 12/13/2020 0953   CHOLHDL 4 09/12/2022 1424   VLDL 31.4 09/12/2022 1424   LDLCALC 102 (H) 09/12/2022 1424   LDLCALC 88 12/13/2020 0953    Physical Exam:    VS:  BP 124/66   Pulse (!) 53   Ht 5\' 6"  (1.676 m)   Wt 129 lb 12.8 oz (58.9 kg)   SpO2 99%   BMI 20.95 kg/m     Wt Readings from Last 3 Encounters:  03/09/23 129 lb 12.8 oz (58.9 kg)  02/28/23 129 lb (58.5 kg)  01/12/23 128 lb (58.1 kg)     GEN: Small stature tearful well nourished, well developed in no acute  distress HEENT: Normal NECK: No JVD; No carotid bruits LYMPHATICS: No lymphadenopathy CARDIAC: RRR, no murmurs, rubs, gallops RESPIRATORY:  Clear to auscultation without rales, wheezing or rhonchi  ABDOMEN: Soft, non-tender, non-distended MUSCULOSKELETAL:  No edema; No deformity  SKIN: Warm and dry NEUROLOGIC:  Alert and oriented x 3 PSYCHIATRIC:  Normal affect    Signed, Norman Herrlich, MD  03/09/2023 10:58 AM    [facility]

## 2023-03-09 ENCOUNTER — Ambulatory Visit: Payer: PPO | Attending: Cardiology | Admitting: Cardiology

## 2023-03-09 ENCOUNTER — Encounter: Payer: Self-pay | Admitting: Cardiology

## 2023-03-09 VITALS — BP 124/66 | HR 53 | Ht 66.0 in | Wt 129.8 lb

## 2023-03-09 DIAGNOSIS — I2510 Atherosclerotic heart disease of native coronary artery without angina pectoris: Secondary | ICD-10-CM

## 2023-03-09 DIAGNOSIS — E782 Mixed hyperlipidemia: Secondary | ICD-10-CM | POA: Diagnosis not present

## 2023-03-09 DIAGNOSIS — I10 Essential (primary) hypertension: Secondary | ICD-10-CM

## 2023-03-09 DIAGNOSIS — I25119 Atherosclerotic heart disease of native coronary artery with unspecified angina pectoris: Secondary | ICD-10-CM

## 2023-03-09 DIAGNOSIS — I209 Angina pectoris, unspecified: Secondary | ICD-10-CM | POA: Diagnosis not present

## 2023-03-09 DIAGNOSIS — Z789 Other specified health status: Secondary | ICD-10-CM | POA: Diagnosis not present

## 2023-03-09 MED ORDER — ISOSORBIDE MONONITRATE ER 30 MG PO TB24: 30.0000 mg | ORAL_TABLET | Freq: Every day | ORAL | 3 refills | Status: DC

## 2023-03-09 MED ORDER — EZETIMIBE 10 MG PO TABS: 10.0000 mg | ORAL_TABLET | Freq: Every day | ORAL | 3 refills | Status: DC

## 2023-03-09 MED ORDER — PREDNISONE 50 MG PO TABS: ORAL_TABLET | ORAL | 0 refills | Status: DC

## 2023-03-09 NOTE — Patient Instructions (Addendum)
Medication Instructions:  Your physician has recommended you make the following change in your medication:   START: Zetia 10 mg daily START: Imdur 30 mg daily STOP: Questran  *If you need a refill on your cardiac medications before your next appointment, please call your pharmacy*   Lab Work: Your physician recommends that you return for lab work in:   Labs today BMP, CBC  If you have labs (blood work) drawn today and your tests are completely normal, you will receive your results only by: MyChart Message (if you have MyChart) OR A paper copy in the mail If you have any lab test that is abnormal or we need to change your treatment, we will call you to review the results.   Testing/Procedures:  [place] A DEPT OF [facility] AT [place] [address] [place] [address] Dept: [phone] Loc: [phone]  [name]  03/09/2023  You are scheduled for a Cardiac Catheterization on Friday, November 1 with Dr. Bryan Lemma.  1. Please arrive at the [facility] (Main Entrance A) at [facility]: [address] [place], [place] [address] at 5:30 AM (This time is 2 hour(s) before your procedure to ensure your preparation). Free valet parking service is available. You will check in at ADMITTING. The support person will be asked to wait in the waiting room.  It is OK to have someone drop you off and come back when you are ready to be discharged.    Special note: Every effort is made to have your procedure done on time. Please understand that emergencies sometimes delay scheduled procedures.  2. Diet: Do not eat solid foods after midnight.  The patient may have clear liquids until 5am upon the day of the procedure.  3. Labs: You will need to have blood drawn on Friday, October 25 at [facility]: [address], [employer], advertising . You do not need to be fasting.  4. Medication instructions in preparation for your  procedure:  Hold Lasix the morning of the procedure.   Contrast Allergy: Yes, Please take Prednisone 50mg  by mouth at: Thirteen hours prior to cath 6:00pm on Thursday Seven hours prior to cath 1:00am on Friday And prior to leaving home please take last dose of Prednisone 50mg  and Benadryl 50mg  by mouth.  On the morning of your procedure, take your Aspirin 81 mg and any morning medicines NOT listed above.  You may use sips of water.  5. Plan to go home the same day, you will only stay overnight if medically necessary. 6. Bring a current list of your medications and current insurance cards. 7. You MUST have a responsible person to drive you home. 8. Someone MUST be with you the first 24 hours after you arrive home or your discharge will be delayed. 9. Please wear clothes that are easy to get on and off and wear slip-on shoes.  Thank you for allowing [place] to care for you!   -- [place] [facility]    Follow-Up: At [facility], you and your health needs are our priority.  As part of our continuing mission to provide you with exceptional heart care, we have created designated Provider Care Teams.  These Care Teams include your primary Cardiologist (physician) and Advanced Practice Providers (APPs -  Physician Assistants and Nurse Practitioners) who all work together to provide you with the care you need, when you need it.  We recommend signing  up for the patient portal called "MyChart".  Sign up information is provided on this After Visit Summary.  MyChart is used to connect with patients for Virtual Visits (Telemedicine).  Patients are able to view lab/test results, encounter notes, upcoming appointments, etc.  Non-urgent messages can be sent to your provider as well.   To learn more about what you can do with MyChart, go to [email or web address].    Your next appointment:   3 month(s)  Provider:   Norman Herrlich, MD    Other Instructions None

## 2023-03-10 LAB — BASIC METABOLIC PANEL
BUN/Creatinine Ratio: 14 (ref 12–28)
BUN: 16 mg/dL (ref 8–27)
CO2: 24 mmol/L (ref 20–29)
Calcium: 8.7 mg/dL (ref 8.7–10.3)
Chloride: 103 mmol/L (ref 96–106)
Creatinine, Ser: 1.16 mg/dL — ABNORMAL HIGH (ref 0.57–1.00)
Glucose: 73 mg/dL (ref 70–99)
Potassium: 4.3 mmol/L (ref 3.5–5.2)
Sodium: 139 mmol/L (ref 134–144)
eGFR: 50 mL/min/{1.73_m2} — ABNORMAL LOW (ref >59)

## 2023-03-10 LAB — CBC
Hematocrit: 38.9 % (ref 34.0–46.6)
Hemoglobin: 12.4 g/dL (ref 11.1–15.9)
MCH: 30.5 pg (ref 26.6–33.0)
MCHC: 31.9 g/dL (ref 31.5–35.7)
MCV: 96 fL (ref 79–97)
Platelets: 265 10*3/uL (ref 150–450)
RBC: 4.06 x10E6/uL (ref 3.77–5.28)
RDW: 12.6 % (ref 11.7–15.4)
WBC: 5.8 10*3/uL (ref 3.4–10.8)

## 2023-03-11 ENCOUNTER — Encounter: Payer: Self-pay | Admitting: Cardiology

## 2023-03-12 ENCOUNTER — Telehealth: Payer: Self-pay | Admitting: Cardiology

## 2023-03-12 ENCOUNTER — Telehealth: Payer: Self-pay

## 2023-03-12 NOTE — Telephone Encounter (Signed)
  Pt would like to cancel her upcoming procedure. She said, she wanted to wait sine the new medications seems to be working well

## 2023-03-12 NOTE — Telephone Encounter (Signed)
Called patient and she stated that she did not want to go through with her cardiac cath at this time. She stated that she would pray about it and think about it with her family and when she was ready to proceed with the cardiac cath she would let us know. Spoke with Dr. Dulce Sellar regarding this and he stated that it was up to her if she wanted to proceed with having the test. I informed the patient that Dr. Dulce Sellar said it was up to her if she wanted to proceed with the cardiac cath. Patient stated she would discuss it with her family and call us back when she was ready to have the test. Patient had no further questions at this time.

## 2023-03-12 NOTE — Telephone Encounter (Signed)
Patient called again about cancelling her procedure for this coming Friday.  She would like a callback.

## 2023-03-12 NOTE — Telephone Encounter (Signed)
Called the cath lab at [facility] and canceled the patient's cardiac cath that was scheduled for 03/16/23, per the patient's wishes.

## 2023-03-16 ENCOUNTER — Encounter (HOSPITAL_COMMUNITY): Payer: Self-pay

## 2023-03-16 ENCOUNTER — Telehealth: Payer: Self-pay | Admitting: Cardiology

## 2023-03-16 ENCOUNTER — Ambulatory Visit (HOSPITAL_COMMUNITY): Admit: 2023-03-16 | Payer: PPO | Admitting: Cardiology

## 2023-03-16 SURGERY — LEFT HEART CATH AND CORONARY ANGIOGRAPHY
Anesthesia: LOCAL

## 2023-03-16 NOTE — Telephone Encounter (Signed)
Pt c/o medication issue:  1. Name of Medication: isosorbide mononitrate (IMDUR) 30 MG 24 hr tablet   2. How are you currently taking this medication (dosage and times per day)? As written   3. Are you having a reaction (difficulty breathing--STAT)?   4. What is your medication issue? Pt called in stating this medication is causing her a bad headache. She asked if she could split it in half and take one in AM and one in PM. Please advise.

## 2023-03-19 DIAGNOSIS — F329 Major depressive disorder, single episode, unspecified: Secondary | ICD-10-CM | POA: Diagnosis not present

## 2023-03-19 NOTE — Telephone Encounter (Signed)
Called patient and she reported that she was taking Imdur and it was giving her a severe headache. She stated she had tried taking tylenol and other headache medication and it has not helped to ease the severity of the headache. She is asking if she can cut the Imdur pill in half and take half in the AM and half in the PM?

## 2023-03-21 ENCOUNTER — Other Ambulatory Visit: Payer: Self-pay

## 2023-03-21 MED ORDER — ISOSORBIDE MONONITRATE ER 30 MG PO TB24: 15.0000 mg | ORAL_TABLET | Freq: Every day | ORAL | 3 refills | Status: AC

## 2023-03-21 NOTE — Telephone Encounter (Signed)
Called patient and informed her of Dr. Hulen Shouts recommendation below:  "If she takes it twice daily she has tolerance and is an effective  She can reduce to one half once daily in the morning"  Patient verbalized understanding and had no further questions at this time.

## 2023-04-25 DIAGNOSIS — F411 Generalized anxiety disorder: Secondary | ICD-10-CM | POA: Diagnosis not present

## 2023-04-25 DIAGNOSIS — F330 Major depressive disorder, recurrent, mild: Secondary | ICD-10-CM | POA: Diagnosis not present

## 2023-04-25 DIAGNOSIS — F5101 Primary insomnia: Secondary | ICD-10-CM | POA: Diagnosis not present

## 2023-05-25 ENCOUNTER — Other Ambulatory Visit: Payer: Self-pay | Admitting: Family Medicine

## 2023-05-25 DIAGNOSIS — M7989 Other specified soft tissue disorders: Secondary | ICD-10-CM

## 2023-05-30 ENCOUNTER — Ambulatory Visit: Payer: Self-pay | Admitting: Family Medicine

## 2023-05-30 NOTE — Telephone Encounter (Signed)
 Copied from CRM [id number]. Topic: Clinical - Red Word Triage >> May 30, 2023 11:59 AM Crist Dominion wrote: Reason for CRM: Patient states it feels like she has a foreign object flown into her eye causing vision issues and mild swelling  Chief Complaint: left eye pain Symptoms: pain and burning in left eye Frequency: started two days ago Pertinent Negatives: Patient denies fever, eye drainage Disposition: [] ED /[] Urgent Care (no appt availability in office) / [x] Appointment(In office/virtual)/ []  [facility]/ [] Home Care/ [] Refused Recommended Disposition /[] [facility]/ []  Follow-up with PCP Additional Notes: states she got something in her eye, maybe smoke.  States eye is burning and painful.  Has apt. Tomorrow.  Care advice given. Denies questions.  Instructed to go to ER if becomes worse.   Reason for Disposition  Minor foreign body in the eye  Answer Assessment - Initial Assessment Questions 1. TYPE OF FOREIGN BODY: "What got in the eye?"      Left eye, "unknown"  2. ONSET: "When did it happen?"      2 days ago 3. MECHANISM: "How did it happen?"      "Maybe smoke" 4. VISION: "Do you have blurred vision?"      Yes 5. PAIN: "Is it painful?" If Yes, ask: "How bad is the pain?"  (Scale 1-10; or mild, moderate, severe)     More than moderate 6. CONTACTS: "Do you wear contacts?"     denies 7. OTHER SYMPTOMS: "Do you have any other symptoms?"     Denies.  Protocols used: Eye - Foreign Body-A-AH

## 2023-05-31 ENCOUNTER — Ambulatory Visit (INDEPENDENT_AMBULATORY_CARE_PROVIDER_SITE_OTHER): Payer: PPO | Admitting: Family Medicine

## 2023-05-31 ENCOUNTER — Encounter: Payer: Self-pay | Admitting: Family Medicine

## 2023-05-31 VITALS — BP 136/71 | HR 56 | Temp 97.8°F | Ht 66.0 in | Wt 134.0 lb

## 2023-05-31 DIAGNOSIS — J069 Acute upper respiratory infection, unspecified: Secondary | ICD-10-CM | POA: Diagnosis not present

## 2023-05-31 DIAGNOSIS — H5712 Ocular pain, left eye: Secondary | ICD-10-CM

## 2023-05-31 DIAGNOSIS — H538 Other visual disturbances: Secondary | ICD-10-CM

## 2023-05-31 DIAGNOSIS — H43392 Other vitreous opacities, left eye: Secondary | ICD-10-CM

## 2023-05-31 NOTE — Patient Instructions (Addendum)
Appt with [facility] scheduled for 06/01/2023 at 9:00 am.   They are located at [address] [facility], [place], [place] [address]  Phone number [phone]

## 2023-05-31 NOTE — Progress Notes (Signed)
Acute Office Visit  Subjective:     Patient ID: Jenna Campbell, female    DOB: 04-15-1951, 73 y.o.   MRN: [id number]  Chief Complaint  Patient presents with   Eye Problem   Sinus Problem     Patient is in today for eye pain.  Discussed the use of AI scribe software for clinical note transcription with the patient, who gave verbal consent to proceed.  History of Present Illness   The patient presents with discomfort in the left eye for the past 2 days, which began with bright, flashing lights in the peripheral vision, followed by the appearance of black squiggly lines. The patient initially thought there was a hair in the eye, but this was not the case. The eye has been dry and itchy, and the patient has been using gel drops for relief. The patient describes a sensation of pressure in the eye, as if there is gel inside, and a feeling of drainage that is not actually occurring.  The patient also reports sinus drainage, predominantly on the left side, and a dry, sore throat. The discomfort in the eye is described as an uncomfortable pressure 7/10, but it has been causing headaches. The patient reports a decrease in peripheral vision in the left eye compared to the right.  The patient has noticed a new blurriness in vision, similar to the sensation immediately after applying eye drops.   In addition to the eye discomfort, the patient has been experiencing a sore throat and sinus congestion for a couple of days. The patient describes the discomfort in the eye as a 7 or 8 out of 10, compared to the right eye. The patient has been managing the sore throat and sinus congestion with over-the-counter treatments such as ibuprofen, Mucinex, and Flonase nasal spray.          ROS All review of systems negative except what is listed in the HPI      Objective:    BP 136/71   Pulse (!) 56   Temp 97.8 F (36.6 C) (Oral)   Ht 5\' 6"  (1.676 m)   Wt 134 lb (60.8 kg)   SpO2 98%   BMI 21.63  kg/m    Physical Exam Vitals reviewed.  Constitutional:      Appearance: Normal appearance.  Eyes:     General: Lids are normal. Gaze aligned appropriately.        Right eye: No foreign body or discharge.        Left eye: No foreign body, discharge or hordeolum.     Conjunctiva/sclera:     Right eye: Right conjunctiva is not injected. No chemosis, exudate or hemorrhage.    Left eye: Left conjunctiva is not injected. No chemosis, exudate or hemorrhage.    Comments: Left eye: pain with EOM, mildly decreased peripheral field  Neurological:     Mental Status: She is alert and oriented to person, place, and time.  Psychiatric:        Mood and Affect: Mood normal.        Behavior: Behavior normal.        Thought Content: Thought content normal.        Judgment: Judgment normal.     No results found for any visits on 05/31/23.      Assessment & Plan:   Problem List Items Addressed This Visit   None Visit Diagnoses       Eye pain, left    -  Primary  Relevant Orders   Ambulatory referral to Ophthalmology     Vitreous floaters of left eye       Relevant Orders   Ambulatory referral to Ophthalmology     Blurred vision, left eye       Relevant Orders   Ambulatory referral to Ophthalmology     Viral URI             Left Eye Discomfort with Visual Changes Patient reports discomfort, pressure, and decreased peripheral vision in the left eye. Noted floaters and flashes of light. Examination revealed pain with eye movement and possible visual field deficit. - Urgent referral to ophthalmology for further evaluation to rule out serious conditions  - Instructed patient to go to the emergency department if symptoms worsen or if a "black curtain" appears over the eye. -Staff was able to get patient an appointment with [facility] tomorrow  Viral URI Patient reports sinus congestion and sore throat for a couple of days. No fever reported. - Advise symptomatic treatment with Tylenol or  ibuprofen, Mucinex, Flonase, and cough drops. - If symptoms persist for more than a week or if a high fever develops, patient should return for re-evaluation and possible antibiotic treatment.        No orders of the defined types were placed in this encounter.   Return if symptoms worsen or fail to improve.  Clayborne Dana, NP

## 2023-06-01 ENCOUNTER — Telehealth: Payer: Self-pay | Admitting: Cardiology

## 2023-06-01 ENCOUNTER — Other Ambulatory Visit: Payer: Self-pay

## 2023-06-01 ENCOUNTER — Emergency Department (HOSPITAL_BASED_OUTPATIENT_CLINIC_OR_DEPARTMENT_OTHER)
Admission: EM | Admit: 2023-06-01 | Discharge: 2023-06-02 | Disposition: A | Discharge status: Home / Self Care | Payer: PPO | Attending: Emergency Medicine | Admitting: Emergency Medicine

## 2023-06-01 ENCOUNTER — Encounter (HOSPITAL_BASED_OUTPATIENT_CLINIC_OR_DEPARTMENT_OTHER): Payer: Self-pay | Admitting: Emergency Medicine

## 2023-06-01 ENCOUNTER — Emergency Department (HOSPITAL_BASED_OUTPATIENT_CLINIC_OR_DEPARTMENT_OTHER): Payer: PPO

## 2023-06-01 DIAGNOSIS — I2510 Atherosclerotic heart disease of native coronary artery without angina pectoris: Secondary | ICD-10-CM | POA: Diagnosis not present

## 2023-06-01 DIAGNOSIS — R079 Chest pain, unspecified: Secondary | ICD-10-CM | POA: Diagnosis not present

## 2023-06-01 DIAGNOSIS — M7989 Other specified soft tissue disorders: Secondary | ICD-10-CM | POA: Insufficient documentation

## 2023-06-01 DIAGNOSIS — M79602 Pain in left arm: Secondary | ICD-10-CM | POA: Insufficient documentation

## 2023-06-01 DIAGNOSIS — R110 Nausea: Secondary | ICD-10-CM | POA: Diagnosis not present

## 2023-06-01 DIAGNOSIS — R0789 Other chest pain: Secondary | ICD-10-CM | POA: Diagnosis not present

## 2023-06-01 DIAGNOSIS — I10 Essential (primary) hypertension: Secondary | ICD-10-CM | POA: Insufficient documentation

## 2023-06-01 DIAGNOSIS — Z7982 Long term (current) use of aspirin: Secondary | ICD-10-CM | POA: Diagnosis not present

## 2023-06-01 DIAGNOSIS — Z20822 Contact with and (suspected) exposure to covid-19: Secondary | ICD-10-CM | POA: Insufficient documentation

## 2023-06-01 DIAGNOSIS — R0602 Shortness of breath: Secondary | ICD-10-CM | POA: Diagnosis not present

## 2023-06-01 DIAGNOSIS — R059 Cough, unspecified: Secondary | ICD-10-CM | POA: Insufficient documentation

## 2023-06-01 DIAGNOSIS — R202 Paresthesia of skin: Secondary | ICD-10-CM | POA: Diagnosis not present

## 2023-06-01 DIAGNOSIS — Z91040 Latex allergy status: Secondary | ICD-10-CM | POA: Diagnosis not present

## 2023-06-01 DIAGNOSIS — H539 Unspecified visual disturbance: Secondary | ICD-10-CM | POA: Diagnosis not present

## 2023-06-01 DIAGNOSIS — H532 Diplopia: Secondary | ICD-10-CM | POA: Insufficient documentation

## 2023-06-01 DIAGNOSIS — R519 Headache, unspecified: Secondary | ICD-10-CM | POA: Insufficient documentation

## 2023-06-01 DIAGNOSIS — R200 Anesthesia of skin: Secondary | ICD-10-CM

## 2023-06-01 DIAGNOSIS — H538 Other visual disturbances: Secondary | ICD-10-CM | POA: Diagnosis not present

## 2023-06-01 LAB — SEDIMENTATION RATE: Sed Rate: 5 mm/h (ref 0–22)

## 2023-06-01 LAB — BASIC METABOLIC PANEL
Anion gap: 8 (ref 5–15)
BUN: 14 mg/dL (ref 8–23)
CO2: 26 mmol/L (ref 22–32)
Calcium: 8.7 mg/dL — ABNORMAL LOW (ref 8.9–10.3)
Chloride: 102 mmol/L (ref 98–111)
Creatinine, Ser: 0.61 mg/dL (ref 0.44–1.00)
GFR, Estimated: >60 mL/min (ref >60)
Glucose, Bld: 107 mg/dL — ABNORMAL HIGH (ref 70–99)
Potassium: 3.6 mmol/L (ref 3.5–5.1)
Sodium: 136 mmol/L (ref 135–145)

## 2023-06-01 LAB — BRAIN NATRIURETIC PEPTIDE: B Natriuretic Peptide: 38.9 pg/mL (ref 0.0–100.0)

## 2023-06-01 LAB — CBC
HCT: 36.7 % (ref 36.0–46.0)
Hemoglobin: 12.3 g/dL (ref 12.0–15.0)
MCH: 30.1 pg (ref 26.0–34.0)
MCHC: 33.5 g/dL (ref 30.0–36.0)
MCV: 90 fL (ref 80.0–100.0)
Platelets: 272 10*3/uL (ref 150–400)
RBC: 4.08 MIL/uL (ref 3.87–5.11)
RDW: 12.6 % (ref 11.5–15.5)
WBC: 6.6 10*3/uL (ref 4.0–10.5)
nRBC: 0 % (ref 0.0–0.2)

## 2023-06-01 LAB — RESP PANEL BY RT-PCR (RSV, FLU A&B, COVID)  RVPGX2
Influenza A by PCR: NEGATIVE
Influenza B by PCR: NEGATIVE
Resp Syncytial Virus by PCR: NEGATIVE
SARS Coronavirus 2 by RT PCR: NEGATIVE

## 2023-06-01 LAB — TROPONIN I (HIGH SENSITIVITY)
Troponin I (High Sensitivity): 5 ng/L (ref <18)
Troponin I (High Sensitivity): 5 ng/L (ref <18)

## 2023-06-01 MED ORDER — ONDANSETRON 4 MG PO TBDP
4.0000 mg | ORAL_TABLET | Freq: Once | ORAL | Status: AC
  Administered 2023-06-01: 4 mg via ORAL
  Filled 2023-06-01: qty 1

## 2023-06-01 NOTE — ED Provider Notes (Signed)
[facility] AT [facility] Provider Note   CSN: [id number] Arrival date & time: 06/01/23  1529     History  Chief Complaint  Patient presents with   Chest Pain   Eye Problem    Jenna Campbell is a 73 y.o. female.  73 year old female past medical history including CAD with previously noted LAD stenosis, Hypertension, dyslipidemia, presenting due to 3-day history of L eye visual disturbance with left-sided headache and nausea. Also with 1 day history of acute worsening of chronic chest pain - left sided, radiating into L neck with numbness/pain in L arm.  Visited PCP yesterday who recommended urgent ophthalmology evaluation. Pt unfortunately missed this appointment today. This afternoon also started having chest pain as above. She then called ophtho office and PCP who both recommended ED eval.  With regards to her vision, she describes seeing floaters and flashes of light and black "spidery" shadows.  She also has pain behind her left eye and along the left side of her head.  She endorses left eye blurriness but denies peripheral vision loss, tunnel vision, diplopia, or "curtain closing".  No recent history of traumatic injury.  She does report that several years ago she had a temporal artery biopsy but does not remember the outcome of this.  Reports that her right eye feels normal.  Regarding her chest pain, she has followed with Dr. Dulce Sellar with outpatient cardiology.  She has a known history of LAD stenosis according to her previous cath.  She was supposed to get a repeat cath done in November but ended up deciding against this. Denies SOB.   She has had a recent cough/cold but no fevers.  Endorses some mild nausea but no vomiting.  Denies abdominal pain.  Reports chronic leg swelling.   Chest Pain Associated symptoms: cough, headache and nausea   Associated symptoms: no abdominal pain, no shortness of breath, no vomiting and no weakness   Eye  Problem Associated symptoms: headaches and nausea   Associated symptoms: no vomiting and no weakness        Home Medications Prior to Admission medications   Medication Sig Start Date End Date Taking? Authorizing Provider  acetaminophen (TYLENOL) 500 MG tablet Take 500 mg by mouth every 6 (six) hours as needed for moderate pain.    [provider]  albuterol (VENTOLIN HFA) 108 (90 Base) MCG/ACT inhaler Inhale 2 puffs into the lungs every 4 (four) hours as needed for wheezing or shortness of breath. 01/05/23   Carmelia Roller, Jilda Roche, DO  ASPIRIN 81 PO Take 81 mg by mouth daily.     [provider]  betamethasone valerate (VALISONE) 0.1 % cream APPLY TO AFFECTED AREA TWICE A DAY Patient taking differently: Apply 1 Application topically daily. 10/21/21   Sharlene Dory, DO  Calcium-Phosphorus-Vitamin D (CALCIUM GUMMIES PO) Take 1 Piece of gum by mouth daily.    [provider]  cholecalciferol (VITAMIN D3) 25 MCG (1000 UNIT) tablet Take 1,000 Units by mouth daily.    [provider]  Coenzyme Q10 (CO Q-10) 200 MG CAPS Take 200 mg by mouth daily. 10/13/20   Revankar, Aundra Dubin, MD  ezetimibe (ZETIA) 10 MG tablet Take 1 tablet (10 mg total) by mouth daily. 03/09/23   Baldo Daub, MD  fluticasone (FLONASE) 50 MCG/ACT nasal spray Place 2 sprays into both nostrils as needed for rhinitis or allergies. 09/04/22   Clayborne Dana, NP  fluticasone-salmeterol (ADVAIR DISKUS) 250-50 MCG/ACT AEPB TAKE  1 PUFF BY MOUTH TWICE A DAY 05/10/22   Sharlene Dory, DO  furosemide (LASIX) 40 MG tablet Take 1 tablet (40 mg total) by mouth daily. 05/10/22   Sharlene Dory, DO  gabapentin (NEURONTIN) 300 MG capsule Take 300 mg by mouth in the morning.    [provider]  hydrocortisone 2.5 % cream Apply topically 2 (two) times daily. Patient taking differently: Apply 1 Application topically daily as needed (itching). 01/12/23   Sharlene Dory, DO   ibuprofen (ADVIL) 200 MG tablet Take 400 mg by mouth every 6 (six) hours as needed for mild pain.    [provider]  ipratropium-albuterol (DUONEB) 0.5-2.5 (3) MG/3ML SOLN Take 3 mLs by nebulization every 4 (four) hours as needed. 09/04/22   Clayborne Dana, NP  isosorbide mononitrate (IMDUR) 30 MG 24 hr tablet Take 0.5 tablets (15 mg total) by mouth daily. Please split the tablet in half and take 1/2 tablet once daily in AM. 03/21/23   Baldo Daub, MD  levocetirizine (XYZAL) 5 MG tablet Take 5 mg by mouth daily. 09/20/20   [provider]  levothyroxine (SYNTHROID) 150 MCG tablet TAKE 1.5 TABLETS (225 MCG TOTAL) BY MOUTH DAILY BEFORE BREAKFAST. 08/15/21   Wendling, Jilda Roche, DO  LORazepam (ATIVAN) 0.5 MG tablet Take 0.5 mg by mouth every 8 (eight) hours as needed for anxiety.     [provider]  Multiple Vitamins-Minerals (PRESERVISION AREDS 2 PO) Take 1 tablet by mouth daily.    [provider]  olopatadine (PATANOL) 0.1 % ophthalmic solution Place 1 drop into both eyes 2 (two) times daily. 09/04/22   Clayborne Dana, NP  OVER THE COUNTER MEDICATION Take 2 tablets by mouth See admin instructions. Darrold Junker Three times a day. No longer than three days    [provider]  pantoprazole (PROTONIX) 40 MG tablet TAKE 1 TABLET BY MOUTH TWICE A DAY Patient taking differently: Take 40 mg by mouth daily. 09/12/22   Sharlene Dory, DO  potassium chloride (KLOR-CON) 10 MEQ tablet TAKE 2 TABLETS WITH EVERY DOSE OF LASIX 40 MG. 05/25/23   Wendling, Jilda Roche, DO  temazepam (RESTORIL) 15 MG capsule Take 15 mg by mouth at bedtime.  07/28/19   [provider]  topiramate (TOPAMAX) 100 MG tablet Take 200 mg by mouth at bedtime.    [provider]  valACYclovir (VALTREX) 1000 MG tablet TAKE 1 TABLET BY MOUTH ONCE DAILY FOR 5 DAYS DURING OUTBREAKS. Patient taking differently: Take 500 mg by mouth daily. TAKE 1 TABLET BY MOUTH ONCE DAILY FOR 5  DAYS DURING OUTBREAKS. 02/01/23   Sharlene Dory, DO      Allergies    Iodinated contrast media, Kenalog [triamcinolone acetonide], Metrizamide, Other, Penicillins, Lipitor [atorvastatin], Montelukast sodium, Pitavastatin, Rosuvastatin, Xyzal [levocetirizine], Ciprofloxacin, Diazepam, Latex, and Sulfa antibiotics    Review of Systems   Review of Systems  HENT:  Positive for rhinorrhea.   Respiratory:  Positive for cough. Negative for shortness of breath and wheezing.   Cardiovascular:  Positive for chest pain.  Gastrointestinal:  Positive for nausea. Negative for abdominal pain, diarrhea and vomiting.  Genitourinary:  Negative for dysuria.  Neurological:  Positive for headaches. Negative for weakness.    Physical Exam Updated Vital Signs BP (!) 153/68   Pulse (!) 56   Temp 98.2 F (36.8 C)   Resp 19   Ht 5\' 6"  (1.676 m)   Wt 60.8 kg   SpO2 97%  BMI 21.63 kg/m  Physical Exam Constitutional:      General: She is not in acute distress. HENT:     Head: Normocephalic and atraumatic.     Comments: Tender to palpation along L temporal region. No visible overlying skin discoloration. No periorbital edema or discoloration.  Eyes:     General: No visual field deficit.    Extraocular Movements: Extraocular movements intact.     Pupils: Pupils are equal, round, and reactive to light.     Comments: Pain with EOM  Cardiovascular:     Rate and Rhythm: Normal rate and regular rhythm.     Heart sounds: Normal heart sounds. No murmur heard. Pulmonary:     Effort: Pulmonary effort is normal. No respiratory distress.     Breath sounds: Normal breath sounds. No wheezing, rhonchi or rales.  Abdominal:     Palpations: Abdomen is soft.     Tenderness: There is no abdominal tenderness.  Musculoskeletal:     Cervical back: Neck supple.     Right lower leg: No edema.     Left lower leg: No edema.  Neurological:     Mental Status: She is alert.     Cranial Nerves: Cranial nerves  2-12 are intact. No cranial nerve deficit or facial asymmetry.     Sensory: Sensation is intact.     Motor: Motor function is intact. No weakness.     ED Results / Procedures / Treatments   Labs (all labs ordered are listed, but only abnormal results are displayed) Labs Reviewed  BASIC METABOLIC PANEL - Abnormal; Notable for the following components:      Result Value   Glucose, Bld 107 (*)    Calcium 8.7 (*)    All other components within normal limits  RESP PANEL BY RT-PCR (RSV, FLU A&B, COVID)  RVPGX2  CBC  SEDIMENTATION RATE  BRAIN NATRIURETIC PEPTIDE  C-REACTIVE PROTEIN  TROPONIN I (HIGH SENSITIVITY)  TROPONIN I (HIGH SENSITIVITY)    EKG EKG Interpretation Date/Time:  Friday June 01 2023 15:42:29 EST Ventricular Rate:  65 PR Interval:  141 QRS Duration:  115 QT Interval:  466 QTC Calculation: 485 R Axis:   84  Text Interpretation: Sinus rhythm Nonspecific intraventricular conduction delay Nonspecific T abnormalities, lateral leads when compared to prior, similar appearance with some artifact and wandering baseline No STEMI Confirmed by Theda Belfast ([id number]) on 06/01/2023 3:45:36 PM  Radiology DG Chest 2 View Result Date: 06/01/2023 CLINICAL DATA:  Chest pain EXAM: CHEST - 2 VIEW COMPARISON:  07/01/2021 FINDINGS: The heart size and mediastinal contours are within normal limits. Both lungs are clear. The visualized skeletal structures are unremarkable. IMPRESSION: No active cardiopulmonary disease. Electronically Signed   By: Jasmine Pang M.D.   On: 06/01/2023 17:41    Procedures Procedures    Medications Ordered in ED Medications  ondansetron (ZOFRAN-ODT) disintegrating tablet 4 mg (4 mg Oral Given 06/01/23 1729)    ED Course/ Medical Decision Making/ A&P                                 Medical Decision Making 73 year old female with history of CAD, LAD stenosis, hypertension, dyslipidemia here with 3-day history of left eye visual disturbance with  headache and nausea as well as 1 day history of acute on chronic chest pain.  Hemodynamically stable. CBC, BMP unremarkable. Zofran given for nausea.  With regards to her visual disturbance, differential is  broad - pt reports distant history of temporal artery biopsy but I am unable to see this in her records. GCA is a consideration given her symptoms with L sided headache and temporal tenderness to palpation. ESR 5, CRP pending.  With no cranial nerve deficits or focal weakness, lower suspicion for stroke.  Other considerations include optic neuritis or other optic neuropathy.  Lower suspicion for retinal artery/vein occlusion, glaucoma, retinal/vitreous detachment given that these are typically painless.  Discussed this with Dr. Selina Cooley on-call neurologist who recommends ED to ED transfer to [facility], where she should get an MRI of her brain and orbits with/without contrast and neurology consult.  With regards to her chest pain, EKG showed NSR without acute ischemic changes, troponin negative x 2, CXR unremarkable. Negative flu/covid/RSV. Given her history of LAD stenosis and plan for repeat cath a few months ago, discussed with Dr. Servando Salina on-call cardiologist who does not feel that the patient would benefit from any acute interventions but rather likely needs further optimization of her antianginal medications.  Dr. Rush Landmark discussed ED to ED transfer with Dr. Rubin Payor who accepted the patient. Pt to transfer via ambulance.    Amount and/or Complexity of Data Reviewed Labs: ordered. Radiology: ordered.  Risk Prescription drug management.          Final Clinical Impression(s) / ED Diagnoses Final diagnoses:  Vision disturbance  Left-sided face pain  Nonintractable headache, unspecified chronicity pattern, unspecified headache type  Left-sided chest pain  Left arm numbness    Rx / DC Orders ED Discharge Orders     None         Vonna Drafts, MD 06/01/23 2104     Tegeler, Canary Brim, MD 06/01/23 [phone]

## 2023-06-01 NOTE — Telephone Encounter (Signed)
Spoke with the ED resident about this patient.  She has a history of coronary artery disease left heart catheterization showing single-vessel moderate disease.  With recommendation for medical management.  Presenting because she has been experiencing floaters in her left eye with pain and pressure which started about 3 days ago.  And is being worked up for this.  In addition there is noted intermittent left-sided chest discomfort with shortness of breath.  Based on the discussion and the patient acute complaints which are related to her left heart discomfort and visual changes which is concerning.  She is being worked up for this.  In terms of her chest discomfort she is not on optimal antianginal and with her acute clinical condition impeding assess is beneficial to optimize her antianginals and clinically reassess.  Advised to start patient on amlodipine 2.5 mg daily given the fact that her blood pressure is also elevated.  If clinical picture changes, and no need for further workup with her acute change in terms of her visual disturbance/left eye discomfort and floaters will reassess and provide further recommendations at that time.

## 2023-06-01 NOTE — ED Notes (Signed)
Called [facility] for transport, patient going to ED at [facility], NO Current ETA ED Nurse will call over to ED to give report Called @ 20:00

## 2023-06-01 NOTE — ED Provider Notes (Signed)
Physical Exam  BP (!) 154/84   Pulse (!) 58   Temp 98 F (36.7 C) (Oral)   Resp 16   Ht 5\' 6"  (1.676 m)   Wt 60.8 kg   SpO2 100%   BMI 21.63 kg/m   Physical Exam Vitals and nursing note reviewed.  Constitutional:      General: She is not in acute distress.    Appearance: She is not toxic-appearing.  Eyes:     Extraocular Movements: Extraocular movements intact.     Pupils: Pupils are equal, round, and reactive to light.  Cardiovascular:     Rate and Rhythm: Normal rate.  Pulmonary:     Effort: Pulmonary effort is normal. No respiratory distress.  Musculoskeletal:     Cervical back: Normal range of motion.  Skin:    General: Skin is warm and dry.  Neurological:     Mental Status: She is alert.     Procedures  Procedures  ED Course / MDM   Clinical Course as of 06/02/23 0757  Sat Jun 02, 2023  0144 Consulted Dr. Randal Buba who will review the chart and will give recommendations.  [RR]  0600 [id number] Fabian Sharp [RR]  0981 MTF. Work up reassuring. Following up with Groat in office today. Remind patient of this.  [CG]    Clinical Course User Index [CG] Al Decant, PA-C [RR] Achille Rich, PA-C   Medical Decision Making Amount and/or Complexity of Data Reviewed Labs: ordered. Radiology: ordered.  Risk Prescription drug management.   This patient was transferred to the [facility] ER for MRI with and without of the brain and orbits. Please see the previous providers note for additional details.   The patient reports of a left sided headache with left eye pain and visual changes for the past 3 days.  Complaining of chest pain as well.  She was supposed to follow up ophthalmology however missed her appointment today.  She was encouraged by her cardiologist and ophthalmologist to come into the ER.  From the previous workup, the consult cardiology and report that she is follow-up outpatient.  We do not see any inpatient need regarding her chest  pain.  Her MRIs show 1. No acute intracranial abnormality. 2. Multifocal hyperintense T2-weighted signal within the white matter, nonspecific but most commonly seen in the setting of chronic small vessel ischemia. 3. Normal MRI of the orbits.  I consulted neurology.  Please see their recommendations.  Leading this is less likely GCA given the normal ESR, CRP as well as similar presenting headache with a normal temporal artery biopsy.  They do recommend giving a migraine cocktail with a narcotic, Compazine, Decadron as well as some IV fluids.  Unfortunately, our Tono-Pen was getting a verity of pressures of the eye between 45 and 20.  I consulted ophthalmology and spoke with Dr. Dione Booze. He will see the patient in the clinic today.  I discussed this with the daughter and asked that she call or text Dr. Dione Booze to schedule an appointment today.  Currently, the patient's not complaining of any head pain but still reports that her eye hurts.  Pupils are still equal round and reactive.  She is extremely sleepy and fatigue given the migraine cocktail.  Will handoff to oncoming shift, Jannifer Hick PA-C, to follow-up and discharge home with ophthalmology follow-up. Information for her already established cardiologist and neurologist listed.   Results for orders placed or performed during the hospital encounter of 06/01/23  Basic metabolic panel  Collection Time: 06/01/23  3:53 PM  Result Value Ref Range   Sodium 136 135 - 145 mmol/L   Potassium 3.6 3.5 - 5.1 mmol/L   Chloride 102 98 - 111 mmol/L   CO2 26 22 - 32 mmol/L   Glucose, Bld 107 (H) 70 - 99 mg/dL   BUN 14 8 - 23 mg/dL   Creatinine, Ser 2.95 0.44 - 1.00 mg/dL   Calcium 8.7 (L) 8.9 - 10.3 mg/dL   GFR, Estimated >28 >41 mL/min   Anion gap 8 5 - 15  CBC   Collection Time: 06/01/23  3:53 PM  Result Value Ref Range   WBC 6.6 4.0 - 10.5 K/uL   RBC 4.08 3.87 - 5.11 MIL/uL   Hemoglobin 12.3 12.0 - 15.0 g/dL   HCT 32.4 40.1 - 02.7 %   MCV 90.0 80.0 -  100.0 fL   MCH 30.1 26.0 - 34.0 pg   MCHC 33.5 30.0 - 36.0 g/dL   RDW 25.3 66.4 - 40.3 %   Platelets 272 150 - 400 K/uL   nRBC 0.0 0.0 - 0.2 %  C-reactive protein   Collection Time: 06/01/23  3:53 PM  Result Value Ref Range   CRP 0.9 <1.0 mg/dL  Brain natriuretic peptide   Collection Time: 06/01/23  3:53 PM  Result Value Ref Range   B Natriuretic Peptide 38.9 0.0 - 100.0 pg/mL  Troponin I (High Sensitivity)   Collection Time: 06/01/23  3:53 PM  Result Value Ref Range   Troponin I (High Sensitivity) 5 <18 ng/L  Resp panel by RT-PCR (RSV, Flu A&B, Covid) Anterior Nasal Swab   Collection Time: 06/01/23  4:28 PM   Specimen: Anterior Nasal Swab  Result Value Ref Range   SARS Coronavirus 2 by RT PCR NEGATIVE NEGATIVE   Influenza A by PCR NEGATIVE NEGATIVE   Influenza B by PCR NEGATIVE NEGATIVE   Resp Syncytial Virus by PCR NEGATIVE NEGATIVE  Sedimentation rate   Collection Time: 06/01/23  5:10 PM  Result Value Ref Range   Sed Rate 5 0 - 22 mm/hr  Troponin I (High Sensitivity)   Collection Time: 06/01/23  5:32 PM  Result Value Ref Range   Troponin I (High Sensitivity) 5 <18 ng/L   MR ORBITS W WO CONTRAST Result Date: 06/02/2023 CLINICAL DATA:  Headache with vision disturbance EXAM: MRI HEAD AND ORBITS WITHOUT AND WITH CONTRAST TECHNIQUE: Multiplanar, multiecho pulse sequences of the brain and surrounding structures were obtained without and with intravenous contrast. Multiplanar, multiecho pulse sequences of the orbits and surrounding structures were obtained including fat saturation techniques, before and after intravenous contrast administration. CONTRAST:  6mL GADAVIST GADOBUTROL 1 MMOL/ML IV SOLN COMPARISON:  None Available. FINDINGS: MRI HEAD FINDINGS Brain: No acute infarct, mass effect or extra-axial collection. No chronic microhemorrhage or siderosis. There is multifocal hyperintense T2-weighted signal within the white matter. Parenchymal volume and CSF spaces are normal. The  midline structures are normal. 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 Vascular: Normal flow voids. Left frontal developmental venous anomaly. Skull and upper cervical spine: Normal marrow signal. Other: None. MRI ORBITS FINDINGS Orbits: No traumatic or inflammatory finding. Globes, optic nerves, orbital fat, extraocular muscles, vascular structures, and lacrimal glands are normal. Visualized sinuses: Trace left mastoid fluid. The paranasal sinuses are clear. Soft tissues: Negative IMPRESSION: 1. No acute intracranial abnormality. 2. Multifocal hyperintense T2-weighted signal within the white matter, nonspecific but most commonly seen in the setting of chronic small vessel ischemia.  3. Normal MRI of the orbits. Electronically Signed   By: Deatra Robinson M.D.   On: 06/02/2023 01:23   MR Brain W and Wo Contrast Result Date: 06/02/2023 CLINICAL DATA:  Headache with vision disturbance EXAM: MRI HEAD AND ORBITS WITHOUT AND WITH CONTRAST TECHNIQUE: Multiplanar, multiecho pulse sequences of the brain and surrounding structures were obtained without and with intravenous contrast. Multiplanar, multiecho pulse sequences of the orbits and surrounding structures were obtained including fat saturation techniques, before and after intravenous contrast administration. CONTRAST:  6mL GADAVIST GADOBUTROL 1 MMOL/ML IV SOLN COMPARISON:  None Available. FINDINGS: MRI HEAD FINDINGS Brain: No acute infarct, mass effect or extra-axial collection. No chronic microhemorrhage or siderosis. There is multifocal hyperintense T2-weighted signal within the white matter. Parenchymal volume and CSF spaces are normal. The midline structures are normal. 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 Vascular: Normal flow voids. Left frontal developmental venous anomaly. Skull and upper cervical spine: Normal marrow signal. Other: None. MRI ORBITS FINDINGS  Orbits: No traumatic or inflammatory finding. Globes, optic nerves, orbital fat, extraocular muscles, vascular structures, and lacrimal glands are normal. Visualized sinuses: Trace left mastoid fluid. The paranasal sinuses are clear. Soft tissues: Negative IMPRESSION: 1. No acute intracranial abnormality. 2. Multifocal hyperintense T2-weighted signal within the white matter, nonspecific but most commonly seen in the setting of chronic small vessel ischemia. 3. Normal MRI of the orbits. Electronically Signed   By: Deatra Robinson M.D.   On: 06/02/2023 01:23   DG Chest 2 View Result Date: 06/01/2023 CLINICAL DATA:  Chest pain EXAM: CHEST - 2 VIEW COMPARISON:  07/01/2021 FINDINGS: The heart size and mediastinal contours are within normal limits. Both lungs are clear. The visualized skeletal structures are unremarkable. IMPRESSION: No active cardiopulmonary disease. Electronically Signed   By: Jasmine Pang M.D.   On: 06/01/2023 17:41           Achille Rich, PA-C 06/02/23 0805    Maia Plan, MD 06/07/23 [phone]

## 2023-06-01 NOTE — ED Triage Notes (Signed)
Pt reports "black spidery things" in her left eye with pain and pressure that started 3  days ago, was seen at P[facility] and referred to Ophthalmologist; pt also c/o intermittent left sided CP that radiates to the left arm & back with ShoB, hx of narrowed LA artery with left sided CP & asthma

## 2023-06-01 NOTE — ED Notes (Signed)
Pt to ED via [facility] , transfer from [facility] here for MRI. Pt voicing no complaints.

## 2023-06-02 ENCOUNTER — Emergency Department (HOSPITAL_COMMUNITY): Payer: PPO

## 2023-06-02 DIAGNOSIS — R519 Headache, unspecified: Secondary | ICD-10-CM

## 2023-06-02 DIAGNOSIS — H539 Unspecified visual disturbance: Secondary | ICD-10-CM

## 2023-06-02 LAB — C-REACTIVE PROTEIN: CRP: 0.9 mg/dL (ref <1.0)

## 2023-06-02 MED ORDER — LACTATED RINGERS IV BOLUS
1000.0000 mL | Freq: Once | INTRAVENOUS | Status: AC
  Administered 2023-06-02: 1000 mL via INTRAVENOUS

## 2023-06-02 MED ORDER — GADOBUTROL 1 MMOL/ML IV SOLN
6.0000 mL | Freq: Once | INTRAVENOUS | Status: AC | PRN
  Administered 2023-06-02: 6 mL via INTRAVENOUS

## 2023-06-02 MED ORDER — DEXAMETHASONE SODIUM PHOSPHATE 10 MG/ML IJ SOLN
10.0000 mg | Freq: Once | INTRAMUSCULAR | Status: AC
Start: 2023-06-02 — End: 2023-06-02
  Administered 2023-06-02: 10 mg via INTRAVENOUS
  Filled 2023-06-02: qty 1

## 2023-06-02 MED ORDER — HYDROMORPHONE HCL 1 MG/ML IJ SOLN
0.5000 mg | Freq: Once | INTRAMUSCULAR | Status: AC
  Administered 2023-06-02: 0.5 mg via INTRAVENOUS
  Filled 2023-06-02: qty 1

## 2023-06-02 MED ORDER — DIPHENHYDRAMINE HCL 50 MG/ML IJ SOLN
12.5000 mg | Freq: Once | INTRAMUSCULAR | Status: AC
  Administered 2023-06-02: 12.5 mg via INTRAVENOUS
  Filled 2023-06-02: qty 1

## 2023-06-02 MED ORDER — TETRACAINE HCL 0.5 % OP SOLN
2.0000 [drp] | Freq: Once | OPHTHALMIC | Status: AC
  Administered 2023-06-02: 2 [drp] via OPHTHALMIC
  Filled 2023-06-02: qty 4

## 2023-06-02 MED ORDER — PROCHLORPERAZINE EDISYLATE 10 MG/2ML IJ SOLN
10.0000 mg | Freq: Once | INTRAMUSCULAR | Status: AC
  Administered 2023-06-02: 10 mg via INTRAVENOUS
  Filled 2023-06-02: qty 2

## 2023-06-02 NOTE — ED Provider Notes (Signed)
  Physical Exam  BP (!) 142/81   Pulse (!) 59   Temp 97.8 F (36.6 C) (Oral)   Resp 17   Ht 5\' 6"  (1.676 m)   Wt 60.8 kg   SpO2 99%   BMI 21.63 kg/m   Physical Exam Vitals and nursing note reviewed.  Constitutional:      General: She is not in acute distress.    Appearance: She is not toxic-appearing.  HENT:     Head: Normocephalic and atraumatic.  Pulmonary:     Effort: No respiratory distress.  Skin:    Coloration: Skin is not jaundiced or pale.  Neurological:     Mental Status: She is alert and oriented to person, place, and time.  Psychiatric:        Behavior: Behavior normal.     Procedures  Procedures  ED Course / MDM   Clinical Course as of 06/02/23 1252  Sat Jun 02, 2023  0144 Consulted Dr. Randal Buba who will review the chart and will give recommendations.  [RR]  0600 [id number] Fabian Sharp [RR]  0981 MTF. Work up reassuring. Following up with Groat in office today. Remind patient of this.  [CG]    Clinical Course User Index [CG] Al Decant, PA-C [RR] Achille Rich, PA-C   Medical Decision Making Amount and/or Complexity of Data Reviewed Labs: ordered. Radiology: ordered.  Risk Prescription drug management.   73 year old female signed out to me at shift change pending reassessment.  Please see the previous provider note for further details.  On reassessment, patient reports that she is feels much better.  She is more awake, less somnolent.  Patient will be discharged at this time.  She will follow-up with Dr. Laruth Bouchard office per previous provider plan.  She was given opportunity to ask all questions and all questions were answered to her satisfaction.  Reached out to patient daughter who will drive patient over to Dr. Laruth Bouchard office for appointment.  Patient stable at this time.       Al Decant, PA-C 06/02/23 1252    Eber Hong, MD 06/03/23 [phone]

## 2023-06-02 NOTE — Progress Notes (Signed)
NEUROLOGY CONSULT NOTE   Date of service: June 02, 2023 Patient Name: Jenna Campbell MRN:  [id number] DOB:  July 25, 1950 Chief Complaint: "L sided headache and vision changes" Requesting Provider: Maia Plan, MD  History of Present Illness  Jenna Campbell is a 73 y.o. female with hx of mild intermittent asthma, allergic rhinitis, GERD, migraines, HLD, neuropathy who presents with left sided headache, left vision changes. She also presents with left sided chest pain.  Symptoms have been ongoing for the last 3 days. Describes pain in her left orbit, periorbital pain that is pressure and throb with some burning. The pain radiates to the entire head and has tenderness to palpation in forehead, temple, occipit and neck.  She has had prior detailed evaluation for temporal arteritis back in Nov 2017. Per notes, she presented back then with left sided headache, tenderness over the left temple and intermittent vision changes. There was concern for temporal arteritis and she underwent laboratory evaluation and a temporal artery biopsy and thou I am unable to see the results myself, her PCP notes from 04/24/2016 do mention that "bx negative".  She has had workup in the ED with normal ESR, CRP. MRI Brain and orbits with no acute abnormalities.  She feels the vision out of her left eye is decreased.   ROS  Comprehensive ROS performed and pertinent positives documented in HPI   Past History   Past Medical History:  Diagnosis Date   Acute bronchitis due to other specified organisms 04/21/2015   Acute hyponatremia 11/19/2020   Acute tension-type headache 12/16/2015   Adjustment disorder with anxious mood 04/21/2015   Allergic asthma with acute exacerbation 09/22/2014   Allergic rhinitis due to pollen 07/23/2014   Allergic urticaria 05/13/2015   Allergy    Alopecia 10/30/2013   Angina pectoris (HCC) 07/29/2019   Arthritis of carpometacarpal (CMC) joint of left thumb 01/10/2022   Asthma     Atrophic vaginitis 10/30/2013   Basal cell carcinoma 04/21/2015   Bilateral impacted cerumen 04/21/2015   CAD (coronary artery disease) 10/28/2019   Cervical radiculopathy 09/17/2015   Change in bowel function 06/14/2016   Chest pain in adult 05/28/2017   Chest tightness 07/29/2019   Chronic diarrhea 12/16/2015   Colon polyps    Cough 02/14/2016   COVID-19 virus infection 11/19/2020   Cystocele, midline 10/30/2013   Dermatitis 09/22/2014   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   Diverticulosis of large intestine 10/30/2013   Drug-induced myopathy 09/12/2022   Dupuytren's contracture of both hands 09/17/2019   Dyspareunia 07/23/2014   Dysuria 12/16/2015   Encounter for long-term (current) use of other medications 10/19/2012   Esophageal dysphagia 06/14/2016   Essential hypertension    Family history of pheochromocytoma 02/14/2016   Foot pain, bilateral 01/05/2018   Gastroesophageal reflux disease    Gastroesophageal reflux disease without esophagitis 05/13/2015   Generalized abdominal pain 12/16/2015   Generalized anxiety disorder 10/21/2021   GERD (gastroesophageal reflux disease)    H/O: hysterectomy    Herpes simplex 08/17/2016   History of adenomatous polyp of colon 06/14/2016   History of blood transfusion 1970   Hx of migraines 05/15/2009   Hyperlipemia 12/10/2020   Hypothyroidism 10/30/2013   Hypotonic bladder 10/30/2013   Insomnia 10/11/2017   Localized edema 12/16/2015   Low back pain 09/17/2015   Lumbar paraspinal muscle spasm 04/21/2015   Major depression, recurrent (HCC) 07/14/2013   Major depressive disorder, recurrent, mild (HCC) 03/14/2017   Malaise and fatigue 04/21/2015   MCI (mild cognitive  impairment) 08/18/2015   Memory difficulty 09/17/2015   Menopause 03/31/2014   Midline low back pain without sciatica 03/31/2014   Migraine    Mild intermittent extrinsic asthma 12/16/2015   Mild persistent asthma 05/13/2015   Mixed dyslipidemia 11/12/2019   Nausea  vomiting and diarrhea 11/19/2020   Neck pain 09/17/2015   Neuropathy 09/22/2014   Neutropenia, unspecified type (HCC) 07/07/2021   Nonintractable headache 11/02/2016   Other allergic rhinitis 05/13/2015   Palpitation 09/03/2017   Pedal edema 07/29/2019   Pelvic pain in female 02/17/2015   Personality disorder (HCC) 10/19/2012   Pharyngoesophageal dysphagia 04/21/2015   Plantar fasciitis 01/20/2016   Precordial chest pain 08/18/2015   Primary insomnia 10/11/2017   Prolapse of vaginal vault after hysterectomy 10/30/2013   Rectocele 10/30/2013   Seasonal allergies 10/15/2019   Senile purpura (HCC) 06/15/2020   Shin splint 04/21/2015   Shortness of breath 07/17/2016   Sinusitis, chronic 10/30/2013   Slow transit constipation 07/23/2014   SOB (shortness of breath) 11/19/2020   Stable angina (HCC) 10/28/2019   Swelling of lower limb 05/17/2016   Tension-type headache, not intractable 09/17/2015   Thyroid disease    Tinnitus of both ears 10/11/2017   Unstable angina (HCC) 07/29/2019   Urethral stricture 10/30/2013   Urinary frequency 02/23/2021   Urinary incontinence    Urinary incontinence without sensory awareness 07/17/2016   Varicose veins of both lower extremities 04/24/2016   Venous insufficiency (chronic) (peripheral) 06/06/2016    Past Surgical History:  Procedure Laterality Date   BREAST CYST ASPIRATION Left    25 years ago    COLONOSCOPY  04/2016   [facility] Diverticulitis and Multiple colon polyps   CORONARY PRESSURE/FFR STUDY N/A 10/17/2019   Procedure: INTRAVASCULAR PRESSURE WIRE/FFR STUDY;  Surgeon: Yvonne Kendall, MD;  Location: [facility];  Service: Cardiovascular;  Laterality: N/A;   ESOPHAGOGASTRODUODENOSCOPY  04/2016   [facility]   LEFT HEART CATH AND CORONARY ANGIOGRAPHY N/A 10/17/2019   Procedure: LEFT HEART CATH AND CORONARY ANGIOGRAPHY;  Surgeon: Yvonne Kendall, MD;  Location: [facility];  Service: Cardiovascular;  Laterality:  N/A;   MOHS SURGERY  10/05/2015   RECTOCELE REPAIR  2011   SKIN CANCER EXCISION  02/2015   Squamous cell removed from back   TONSILLECTOMY     VAGINAL HYSTERECTOMY     VAGINAL PROLAPSE REPAIR      Family History: Family History  Problem Relation Age of Onset   Asthma Father    Angina Father    Emphysema Father    Alzheimer's disease Mother    Cancer Sister        died of cancer   Colon cancer Neg Hx    Esophageal cancer Neg Hx     Social History  reports that she has never smoked. She has never used smokeless tobacco. She reports that she does not currently use alcohol. She reports that she does not use drugs.  Allergies  Allergen Reactions   Iodinated Contrast Media Itching, Other (See Comments), Rash and Shortness Of Breath    Throat Swelling, Erythema   Kenalog [Triamcinolone Acetonide] Swelling    Heart pain   Metrizamide Itching, Other (See Comments), Rash and Shortness Of Breath    Throat Swelling, Erythema   Other Itching and Swelling    DUST, spicy food, red sause   Penicillins Anaphylaxis and Itching   Lipitor [Atorvastatin] Other (See Comments)    myalgia   Montelukast Sodium Other (See Comments)  Chest pain, dizziness and lightheadedness   Pitavastatin Other (See Comments)    Myalgia   Rosuvastatin Itching and Other (See Comments)    myalgia    Xyzal [Levocetirizine]     Chest pressure   Ciprofloxacin Itching   Diazepam Rash   Latex Itching, Rash and Swelling   Sulfa Antibiotics Rash    Medications  No current facility-administered medications for this encounter.  Current Outpatient Medications:    acetaminophen (TYLENOL) 500 MG tablet, Take 500 mg by mouth every 6 (six) hours as needed for moderate pain., Disp: , Rfl:    albuterol (VENTOLIN HFA) 108 (90 Base) MCG/ACT inhaler, Inhale 2 puffs into the lungs every 4 (four) hours as needed for wheezing or shortness of breath., Disp: 18 g, Rfl: 5   ASPIRIN 81 PO, Take 81 mg by mouth daily. , Disp:  , Rfl:    betamethasone valerate (VALISONE) 0.1 % cream, APPLY TO AFFECTED AREA TWICE A DAY (Patient taking differently: Apply 1 Application topically daily.), Disp: 45 g, Rfl: 3   Calcium-Phosphorus-Vitamin D (CALCIUM GUMMIES PO), Take 1 Piece of gum by mouth daily., Disp: , Rfl:    cholecalciferol (VITAMIN D3) 25 MCG (1000 UNIT) tablet, Take 1,000 Units by mouth daily., Disp: , Rfl:    Coenzyme Q10 (CO Q-10) 200 MG CAPS, Take 200 mg by mouth daily., Disp: 90 capsule, Rfl: 3   ezetimibe (ZETIA) 10 MG tablet, Take 1 tablet (10 mg total) by mouth daily., Disp: 90 tablet, Rfl: 3   fluticasone (FLONASE) 50 MCG/ACT nasal spray, Place 2 sprays into both nostrils as needed for rhinitis or allergies., Disp: 16 g, Rfl: 5   fluticasone-salmeterol (ADVAIR DISKUS) 250-50 MCG/ACT AEPB, TAKE 1 PUFF BY MOUTH TWICE A DAY, Disp: 60 each, Rfl: 2   furosemide (LASIX) 40 MG tablet, Take 1 tablet (40 mg total) by mouth daily., Disp: 90 tablet, Rfl: 1   gabapentin (NEURONTIN) 300 MG capsule, Take 300 mg by mouth in the morning., Disp: , Rfl:    hydrocortisone 2.5 % cream, Apply topically 2 (two) times daily. (Patient taking differently: Apply 1 Application topically daily as needed (itching).), Disp: 30 g, Rfl: 0   ibuprofen (ADVIL) 200 MG tablet, Take 400 mg by mouth every 6 (six) hours as needed for mild pain., Disp: , Rfl:    ipratropium-albuterol (DUONEB) 0.5-2.5 (3) MG/3ML SOLN, Take 3 mLs by nebulization every 4 (four) hours as needed., Disp: 75 mL, Rfl: 0   isosorbide mononitrate (IMDUR) 30 MG 24 hr tablet, Take 0.5 tablets (15 mg total) by mouth daily. Please split the tablet in half and take 1/2 tablet once daily in AM., Disp: 90 tablet, Rfl: 3   levocetirizine (XYZAL) 5 MG tablet, Take 5 mg by mouth daily., Disp: , Rfl:    levothyroxine (SYNTHROID) 150 MCG tablet, TAKE 1.5 TABLETS (225 MCG TOTAL) BY MOUTH DAILY BEFORE BREAKFAST., Disp: 135 tablet, Rfl: 2   LORazepam (ATIVAN) 0.5 MG tablet, Take 0.5 mg by mouth  every 8 (eight) hours as needed for anxiety. , Disp: , Rfl:    Multiple Vitamins-Minerals (PRESERVISION AREDS 2 PO), Take 1 tablet by mouth daily., Disp: , Rfl:    olopatadine (PATANOL) 0.1 % ophthalmic solution, Place 1 drop into both eyes 2 (two) times daily., Disp: 5 mL, Rfl: 1   OVER THE COUNTER MEDICATION, Take 2 tablets by mouth See admin instructions. Darrold Junker Three times a day. No longer than three days, Disp: , Rfl:    pantoprazole (PROTONIX) 40 MG  tablet, TAKE 1 TABLET BY MOUTH TWICE A DAY (Patient taking differently: Take 40 mg by mouth daily.), Disp: 180 tablet, Rfl: 1   potassium chloride (KLOR-CON) 10 MEQ tablet, TAKE 2 TABLETS WITH EVERY DOSE OF LASIX 40 MG., Disp: 360 tablet, Rfl: 3   temazepam (RESTORIL) 15 MG capsule, Take 15 mg by mouth at bedtime. , Disp: , Rfl:    topiramate (TOPAMAX) 100 MG tablet, Take 200 mg by mouth at bedtime., Disp: , Rfl:    valACYclovir (VALTREX) 1000 MG tablet, TAKE 1 TABLET BY MOUTH ONCE DAILY FOR 5 DAYS DURING OUTBREAKS. (Patient taking differently: Take 500 mg by mouth daily. TAKE 1 TABLET BY MOUTH ONCE DAILY FOR 5 DAYS DURING OUTBREAKS.), Disp: 90 tablet, Rfl: 0  Vitals   Vitals:   2023/06/25 1930 06-25-2023 2000 06-25-2023 2030 Jun 25, 2023 2150  BP: 128/88 (!) 142/65 (!) 153/68 (!) 154/84  Pulse: (!) 56 (!) 58 (!) 56 (!) 58  Resp: 14 (!) 21 19 16   Temp:    98 F (36.7 C)  TempSrc:    Oral  SpO2: 98% 98% 97% 100%  Weight:      Height:        Body mass index is 21.63 kg/m.  Physical Exam   General: Laying comfortably in bed; in no acute distress.  HENT: Normal oropharynx and mucosa. Normal external appearance of ears and nose.  Neck: Supple, no pain or tenderness  CV: No JVD. No peripheral edema.  Pulmonary: Symmetric Chest rise. Normal respiratory effort.  Abdomen: Soft to touch, non-tender.  Ext: No cyanosis, edema, or deformity  Skin: No rash. Normal palpation of skin.   Musculoskeletal: Normal digits and nails by inspection. No clubbing.    Neurologic Examination  Mental status/Cognition: Alert, oriented to self, place, month and year, good attention.  Speech/language: Fluent, comprehension intact, object naming intact, repetition intact.  Cranial nerves:   CN II Pupils equal and reactive to light, no VF deficits    CN III,IV,VI EOM intact, no gaze preference or deviation, no nystagmus    CN V normal sensation in V1, V2, and V3 segments bilaterally    CN VII no asymmetry, no nasolabial fold flattening    CN VIII normal hearing to speech    CN IX & X normal palatal elevation, no uvular deviation    CN XI 5/5 head turn and 5/5 shoulder shrug bilaterally    CN XII midline tongue protrusion    Motor:  Muscle bulk: poor, tone normal, pronator drift none tremor none Mvmt Root Nerve  Muscle Right Left Comments  SA C5/6 Ax Deltoid 5 5   EF C5/6 Mc Biceps 5 5   EE C6/7/8 Rad Triceps 5 5   WF C6/7 Med FCR     WE C7/8 PIN ECU     F Ab C8/T1 U ADM/FDI 5 5   HF L1/2/3 Fem Illopsoas 4+ 4+   KE L2/3/4 Fem Quad 5 5   DF L4/5 D Peron Tib Ant 5 5   PF S1/2 Tibial Grc/Sol 5 5    Sensation:  Light touch Intact throughout   Pin prick    Temperature    Vibration   Proprioception    Coordination/Complex Motor:  - Finger to Nose intact BL - Heel to shin intact BL - Rapid alternating movement are normal - Gait: deferred.   Labs/Imaging/Neurodiagnostic studies   CBC:  Recent Labs  Lab 25-Jun-2023 1553  WBC 6.6  HGB 12.3  HCT 36.7  MCV 90.0  PLT 272   Basic Metabolic Panel:  Lab Results  Component Value Date   NA 136 06/01/2023   K 3.6 06/01/2023   CO2 26 06/01/2023   GLUCOSE 107 (H) 06/01/2023   BUN 14 06/01/2023   CREATININE 0.61 06/01/2023   CALCIUM 8.7 (L) 06/01/2023   GFRNONAA >60 06/01/2023   GFRAA 95 05/20/2020   Lipid Panel:  Lab Results  Component Value Date   LDLCALC 102 (H) 09/12/2022   HgbA1c: No results found for: "HGBA1C" Urine Drug Screen: No results found for: "LABOPIA", "COCAINSCRNUR",  "LABBENZ", "AMPHETMU", "THCU", "LABBARB"  Alcohol Level No results found for: "ETH" INR No results found for: "INR" APTT No results found for: "APTT" AED levels: No results found for: "PHENYTOIN", "ZONISAMIDE", "LAMOTRIGINE", "LEVETIRACETA"  CT Head without contrast(Personally reviewed): CTH was negative for a large hypodensity concerning for a large territory infarct or hyperdensity concerning for an ICH  MRI Brain(Personally reviewed): No acute abnormalities.  MRI Orbit(Personally reviewed): No acute abnormalities  ASSESSMENT   Jenna Campbell is a 73 y.o. female with hx of mild intermittent asthma, allergic rhinitis, GERD, migraines, HLD, neuropathy who presents with left sided headache, left vision changes. She had workup with MRI brain and orbits with no acute abnormalities, ESR and CRP is normal. Appears to have had similar presentation back in 2017 with concern about potential GCA and underwent extensive workup including a negative temporal artery biopsy per PCP notes, thou I cannot see the biopsy report myself.  My suspicion for her current episode being GCA is low specially with a normal ESR and CRP. She has tenderness to palpation pretty much over the entire head and not just over the temple.  I suspect this is likely a migrainous headache but also recommend measuring IOP with a Tonopen. Treat symptomatically with   RECOMMENDATIONS  - Headache cocktail. Discharge is headache improves with cocktail. - follow up with her outpatient neurologist. ______________________________________________________________________    Welton Flakes, MD [facility]

## 2023-06-02 NOTE — Discharge Instructions (Addendum)
You were seen in the ER for multiple complaints. Your cardiac (heart) workup with unremarkable for any new changes. Cardiology would like for you to follow up outpatient. Please make sure you call your cardiologist to schedule an appointment. For your headaches, you will need to follow up with a neurologist. I have included the information for one into the discharge paperwork. Please call to schedule an appointment. For your eye pain, I have discussed this case with our on call physician, Dr. Dione Booze. He would like to see you in the office today. Please make sure to call or text this number and he will schedule to see you in the office TODAY. If you have any concerns, new or worsening symptoms, please return to the ER for re-evaluation.    Contact a doctor if: Your chest pain does not go away. You feel depressed. You have a fever. Get help right away if: Your chest pain is worse. You have a cough that gets worse, or you cough up blood. You have very bad (severe) pain in your belly (abdomen). You pass out (faint). You have either of these for no clear reason: Sudden chest discomfort. Sudden discomfort in your arms, back, neck, or jaw. You have shortness of breath at any time. You suddenly start to sweat, or your skin gets clammy. You feel sick to your stomach (nauseous). You throw up (vomit). You suddenly feel lightheaded or dizzy. You feel very weak or tired. Your heart starts to beat fast, or it feels like it is skipping beats. These symptoms may be an emergency. Do not wait to see if the symptoms will go away. Get medical help right away. Call your local emergency services (911 in the [place] Do not drive yourself to the hospital.

## 2023-06-02 NOTE — ED Notes (Signed)
Patient transported to MRI 

## 2023-06-03 ENCOUNTER — Other Ambulatory Visit: Payer: Self-pay | Admitting: Family Medicine

## 2023-06-11 ENCOUNTER — Ambulatory Visit (INDEPENDENT_AMBULATORY_CARE_PROVIDER_SITE_OTHER): Payer: PPO | Admitting: Family Medicine

## 2023-06-11 ENCOUNTER — Encounter: Payer: Self-pay | Admitting: Family Medicine

## 2023-06-11 ENCOUNTER — Ambulatory Visit: Payer: PPO | Admitting: Cardiology

## 2023-06-11 VITALS — BP 126/76 | HR 66 | Temp 98.0°F | Resp 16 | Ht 66.0 in | Wt 129.2 lb

## 2023-06-11 DIAGNOSIS — J0140 Acute pansinusitis, unspecified: Secondary | ICD-10-CM

## 2023-06-11 MED ORDER — DOXYCYCLINE HYCLATE 100 MG PO TABS: 100.0000 mg | ORAL_TABLET | Freq: Two times a day (BID) | ORAL | 0 refills | Status: AC

## 2023-06-11 MED ORDER — FLUCONAZOLE 150 MG PO TABS: ORAL_TABLET | ORAL | 0 refills | Status: DC

## 2023-06-11 NOTE — Progress Notes (Signed)
Chief Complaint  Patient presents with   Nasal Congestion    Nasal Congestion    Jenna Campbell here for URI complaints.  Duration: 11 days - worsening Associated symptoms: sinus headache, sinus congestion, sinus pain, rhinorrhea, ear fullness, wheezing, shortness of breath, myalgia, and coughing Denies: ear pain, ear drainage, sore throat, and fevers Treatment to date: Mucinex, INCS, cough drops Sick contacts: Yes- residents also ill Tested neg for COVID/Flu.   Past Medical History:  Diagnosis Date   Acute bronchitis due to other specified organisms 04/21/2015   Acute hyponatremia 11/19/2020   Acute tension-type headache 12/16/2015   Adjustment disorder with anxious mood 04/21/2015   Allergic asthma with acute exacerbation 09/22/2014   Allergic rhinitis due to pollen 07/23/2014   Allergic urticaria 05/13/2015   Allergy    Alopecia 10/30/2013   Angina pectoris (HCC) 07/29/2019   Arthritis of carpometacarpal [facility]) joint of left thumb 01/10/2022   Asthma    Atrophic vaginitis 10/30/2013   Basal cell carcinoma 04/21/2015   Bilateral impacted cerumen 04/21/2015   CAD (coronary artery disease) 10/28/2019   Cervical radiculopathy 09/17/2015   Change in bowel function 06/14/2016   Chest pain in adult 05/28/2017   Chest tightness 07/29/2019   Chronic diarrhea 12/16/2015   Colon polyps    Cough 02/14/2016   COVID-19 virus infection 11/19/2020   Cystocele, midline 10/30/2013   Dermatitis 09/22/2014   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   Diverticulosis of large intestine 10/30/2013   Drug-induced myopathy 09/12/2022   Dupuytren's contracture of both hands 09/17/2019   Dyspareunia 07/23/2014   Dysuria 12/16/2015   Encounter for long-term (current) use of other medications 10/19/2012   Esophageal dysphagia 06/14/2016   Essential hypertension    Family history of pheochromocytoma 02/14/2016   Foot pain, bilateral 01/05/2018   Gastroesophageal reflux disease    Gastroesophageal  reflux disease without esophagitis 05/13/2015   Generalized abdominal pain 12/16/2015   Generalized anxiety disorder 10/21/2021   GERD (gastroesophageal reflux disease)    H/O: hysterectomy    Herpes simplex 08/17/2016   History of adenomatous polyp of colon 06/14/2016   History of blood transfusion 1970   Hx of migraines 05/15/2009   Hyperlipemia 12/10/2020   Hypothyroidism 10/30/2013   Hypotonic bladder 10/30/2013   Insomnia 10/11/2017   Localized edema 12/16/2015   Low back pain 09/17/2015   Lumbar paraspinal muscle spasm 04/21/2015   Major depression, recurrent (HCC) 07/14/2013   Major depressive disorder, recurrent, mild (HCC) 03/14/2017   Malaise and fatigue 04/21/2015   MCI (mild cognitive impairment) 08/18/2015   Memory difficulty 09/17/2015   Menopause 03/31/2014   Midline low back pain without sciatica 03/31/2014   Migraine    Mild intermittent extrinsic asthma 12/16/2015   Mild persistent asthma 05/13/2015   Mixed dyslipidemia 11/12/2019   Nausea vomiting and diarrhea 11/19/2020   Neck pain 09/17/2015   Neuropathy 09/22/2014   Neutropenia, unspecified type (HCC) 07/07/2021   Nonintractable headache 11/02/2016   Other allergic rhinitis 05/13/2015   Palpitation 09/03/2017   Pedal edema 07/29/2019   Pelvic pain in female 02/17/2015   Personality disorder (HCC) 10/19/2012   Pharyngoesophageal dysphagia 04/21/2015   Plantar fasciitis 01/20/2016   Precordial chest pain 08/18/2015   Primary insomnia 10/11/2017   Prolapse of vaginal vault after hysterectomy 10/30/2013   Rectocele 10/30/2013   Seasonal allergies 10/15/2019   Senile purpura (HCC) 06/15/2020   Shin splint 04/21/2015   Shortness of breath 07/17/2016   Sinusitis, chronic 10/30/2013   Slow transit constipation 07/23/2014  SOB (shortness of breath) 11/19/2020   Stable angina (HCC) 10/28/2019   Swelling of lower limb 05/17/2016   Tension-type headache, not intractable 09/17/2015   Thyroid disease     Tinnitus of both ears 10/11/2017   Unstable angina (HCC) 07/29/2019   Urethral stricture 10/30/2013   Urinary frequency 02/23/2021   Urinary incontinence    Urinary incontinence without sensory awareness 07/17/2016   Varicose veins of both lower extremities 04/24/2016   Venous insufficiency (chronic) (peripheral) 06/06/2016    Objective BP 126/76   Pulse 66   Temp 98 F (36.7 C) (Oral)   Resp 16   Ht 5\' 6"  (1.676 m)   Wt 129 lb 3.2 oz (58.6 kg)   SpO2 98%   BMI 20.85 kg/m  General: Awake, alert, appears stated age HEENT: AT, Horace, ears patent b/l and TM's neg, nares patent w/o discharge, pharynx pink and without exudates, MMM, +max and frontal sinus ttp b/l Neck: No masses or asymmetry Heart: RRR Lungs: CTAB, no accessory muscle use Psych: Age appropriate judgment and insight, normal mood and affect  Acute non-recurrent pansinusitis - Plan: doxycycline (VIBRA-TABS) 100 MG tablet, fluconazole (DIFLUCAN) 150 MG tablet  Worsening at day 10, tx w 7 d of abx. Diflucan prn. Continue to push fluids, practice good hand hygiene, cover mouth when coughing. F/u prn. If starting to experience fevers, shaking, or shortness of breath, seek immediate care. Pt voiced understanding and agreement to the plan.  Jilda Roche Fontana, DO 06/11/23 11:02 AM

## 2023-06-11 NOTE — Patient Instructions (Signed)
Continue to push fluids, practice good hand hygiene, and cover your mouth if you cough.  If you start having fevers, shaking or shortness of breath, seek immediate care.

## 2023-06-19 DIAGNOSIS — G44209 Tension-type headache, unspecified, not intractable: Secondary | ICD-10-CM | POA: Diagnosis not present

## 2023-06-19 DIAGNOSIS — Z8659 Personal history of other mental and behavioral disorders: Secondary | ICD-10-CM | POA: Diagnosis not present

## 2023-06-19 DIAGNOSIS — R29898 Other symptoms and signs involving the musculoskeletal system: Secondary | ICD-10-CM | POA: Diagnosis not present

## 2023-06-19 DIAGNOSIS — M545 Low back pain, unspecified: Secondary | ICD-10-CM | POA: Diagnosis not present

## 2023-06-19 DIAGNOSIS — R419 Unspecified symptoms and signs involving cognitive functions and awareness: Secondary | ICD-10-CM | POA: Diagnosis not present

## 2023-06-19 DIAGNOSIS — M5020 Other cervical disc displacement, unspecified cervical region: Secondary | ICD-10-CM | POA: Diagnosis not present

## 2023-06-25 ENCOUNTER — Encounter: Payer: Self-pay | Admitting: Pharmacist

## 2023-06-25 NOTE — Progress Notes (Addendum)
Pharmacy Quality Measure Review  This patient is appearing on 2024 report for failing the measure for Statin Therapy for Patients with Cardiovascular Disease [facility]).   Jenna Campbell has documented CAD but unable to take statin.  She has tried atorvastatin, pravastatin and rosuvastatin. She has documented adverse reaction of  myalgias with all 3 statins.   Lab Results  Component Value Date   CHOL 173 09/12/2022   HDL 39.60 09/12/2022   LDLCALC 102 (H) 09/12/2022   TRIG 157.0 (H) 09/12/2022   CHOLHDL 4 09/12/2022   Patient will see cardiology office 07/02/2023 - Sent note in Epic for nurse practitioner she is scheduled with in cardiology office and placed a note in visit notes to review and consider using statin exclusion code if appropriate.    Henrene Pastor, PharmD Clinical Pharmacist [facility]   07/03/2023 - Cardiology office had plans to review past statin therapy and address exclusions however patient was ill and had to cancel appointment 07/02/2023. Appointment has not been rescheduled yet. Will continue to follow.   Henrene Pastor, PharmD Clinical Pharmacist [facility]  Population Health [phone]

## 2023-06-28 ENCOUNTER — Encounter: Payer: Self-pay | Admitting: Family Medicine

## 2023-06-28 ENCOUNTER — Ambulatory Visit: Payer: PPO | Admitting: Family Medicine

## 2023-06-28 ENCOUNTER — Telehealth: Payer: Self-pay | Admitting: Cardiology

## 2023-06-28 VITALS — BP 130/88 | HR 60 | Temp 97.9°F | Resp 20 | Ht 66.0 in | Wt 129.8 lb

## 2023-06-28 DIAGNOSIS — J40 Bronchitis, not specified as acute or chronic: Secondary | ICD-10-CM

## 2023-06-28 DIAGNOSIS — R6889 Other general symptoms and signs: Secondary | ICD-10-CM | POA: Diagnosis not present

## 2023-06-28 DIAGNOSIS — R051 Acute cough: Secondary | ICD-10-CM

## 2023-06-28 DIAGNOSIS — J4540 Moderate persistent asthma, uncomplicated: Secondary | ICD-10-CM

## 2023-06-28 LAB — POC COVID19 BINAXNOW: SARS Coronavirus 2 Ag: NEGATIVE

## 2023-06-28 LAB — POC INFLUENZA A&B (BINAX/QUICKVUE)
Influenza A, POC: NEGATIVE
Influenza B, POC: NEGATIVE

## 2023-06-28 MED ORDER — AZITHROMYCIN 250 MG PO TABS: ORAL_TABLET | ORAL | 0 refills | Status: DC

## 2023-06-28 MED ORDER — FLUTICASONE-SALMETEROL 250-50 MCG/ACT IN AEPB
INHALATION_SPRAY | RESPIRATORY_TRACT | 2 refills | Status: AC
Start: 2023-06-28 — End: ?

## 2023-06-28 MED ORDER — BENZONATATE 100 MG PO CAPS: 100.0000 mg | ORAL_CAPSULE | Freq: Two times a day (BID) | ORAL | 0 refills | Status: DC | PRN

## 2023-06-28 NOTE — Patient Instructions (Signed)

## 2023-06-28 NOTE — Telephone Encounter (Signed)
Pt c/o medication issue:  1. Name of Medication:   ezetimibe (ZETIA) 10 MG tablet   2. How are you currently taking this medication (dosage and times per day)?   Not taking medication  3. Are you having a reaction (difficulty breathing--STAT)?   4. What is your medication issue?   Patient stated this medication has been giving her headaches.  Patient stated she also eye issues and sinusitis.  Patient wants advice on next steps.

## 2023-06-28 NOTE — Progress Notes (Signed)
0  Established Patient Office Visit  Subjective   Patient ID: Jenna Campbell, female    DOB: 23-Jan-1951  Age: 73 y.o. MRN: [id number]  Chief Complaint  Patient presents with   Cough    Sxs stated x2 days, pt states having runny nose, cough,fatigue. No COVID test    HPI Discussed the use of AI scribe software for clinical note transcription with the patient, who gave verbal consent to proceed.  History of Present Illness   Jenna Campbell is a 73 year old female with asthma who presents with worsening respiratory symptoms and sinus congestion.  For the past week, she has experienced a cough, nasal congestion, itchy eyes, and a sore throat. Initially attributing these symptoms to allergies, she has progressively worsened, with increased mucus production and occasional hemoptysis. She also notes a sensation of fullness in her ears and a persistent need to blow her nose, which quickly refills with mucus.  She recalls a previous episode of sinusitis treated with doxycycline, which initially improved her symptoms. However, the symptoms have recurred approximately a week ago.  She has been using over-the-counter medications including Mucinex and ziCAM nasal spray, but reports no significant relief. She also uses Nasonex for nasal symptoms. Her asthma management includes Advair and Proair inhalers, and she indicates a need for a new prescription for Advair. Additionally, she uses [facility] for cough management and has nausea medication available.  She lives in an environment with smokers and an old heating system, which may exacerbate her respiratory issues. She uses a humidifier and air purifier to manage her symptoms at home.  She has a history of PVD in her left eye and is using eye drops for this condition. She is scheduled for cataract surgery in a few months.  She frequently feels cold, attributing this to the weather and not wearing socks. No new body aches, attributing  her usual aches to arthritis. She feels tired and is taking calcium supplements.      Patient Active Problem List   Diagnosis Date Noted   Drug-induced myopathy 09/12/2022   Arthritis of carpometacarpal [facility]) joint of left thumb 01/10/2022   Generalized anxiety disorder 10/21/2021   Neutropenia, unspecified type (HCC) 07/07/2021   Urinary frequency 02/23/2021   Hyperlipemia 12/10/2020   Nausea vomiting and diarrhea 11/19/2020   COVID-19 virus infection 11/19/2020   SOB (shortness of breath) 11/19/2020   Acute hyponatremia 11/19/2020   Senile purpura (HCC) 06/15/2020   GERD (gastroesophageal reflux disease)    Mixed dyslipidemia 11/12/2019   CAD (coronary artery disease) 10/28/2019   Stable angina (HCC) 10/28/2019   Seasonal allergies 10/15/2019   Dupuytren's contracture of both hands 09/17/2019   Chest tightness 07/29/2019   Unstable angina (HCC) 07/29/2019   Pedal edema 07/29/2019   Angina pectoris (HCC) 07/29/2019   Foot pain, bilateral 01/05/2018   Tinnitus of both ears 10/11/2017   Primary insomnia 10/11/2017   Insomnia 10/11/2017   Palpitation 09/03/2017   Chest pain in adult 05/28/2017   Major depressive disorder, recurrent, mild (HCC) 03/14/2017   Allergy    Asthma    Colon polyps    Essential hypertension    Gastroesophageal reflux disease    H/O: hysterectomy    Thyroid disease    Urinary incontinence    Nonintractable headache 11/02/2016   Herpes simplex 08/17/2016   Shortness of breath 07/17/2016   Urinary incontinence without sensory awareness 07/17/2016   Change in bowel function 06/14/2016  Esophageal dysphagia 06/14/2016   History of adenomatous polyp of colon 06/14/2016   Venous insufficiency (chronic) (peripheral) 06/06/2016   Swelling of lower limb 05/17/2016   Varicose veins of both lower extremities 04/24/2016   Cough 02/14/2016   Family history of pheochromocytoma 02/14/2016   Plantar fasciitis 01/20/2016   Acute tension-type headache  12/16/2015   Chronic diarrhea 12/16/2015   Dysuria 12/16/2015   Generalized abdominal pain 12/16/2015   Localized edema 12/16/2015   Mild intermittent extrinsic asthma 12/16/2015   Cervical radiculopathy 09/17/2015   Neck pain 09/17/2015   Low back pain 09/17/2015   Memory difficulty 09/17/2015   Tension-type headache, not intractable 09/17/2015   MCI (mild cognitive impairment) 08/18/2015   Precordial chest pain 08/18/2015   Mild persistent asthma 05/13/2015   Other allergic rhinitis 05/13/2015   Gastroesophageal reflux disease without esophagitis 05/13/2015   Allergic urticaria 05/13/2015   Acute bronchitis due to other specified organisms 04/21/2015   Adjustment disorder with anxious mood 04/21/2015   Basal cell carcinoma 04/21/2015   Bilateral impacted cerumen 04/21/2015   Lumbar paraspinal muscle spasm 04/21/2015   Malaise and fatigue 04/21/2015   Pharyngoesophageal dysphagia 04/21/2015   Shin splint 04/21/2015   Pelvic pain in female 02/17/2015   Allergic asthma with acute exacerbation 09/22/2014   Dermatitis 09/22/2014   Neuropathy 09/22/2014   Allergic rhinitis due to pollen 07/23/2014   Dyspareunia 07/23/2014   Slow transit constipation 07/23/2014   Menopause 03/31/2014   Midline low back pain without sciatica 03/31/2014   Alopecia 10/30/2013   Cystocele, midline 10/30/2013   Diverticulosis of large intestine 10/30/2013   Hypothyroidism 10/30/2013   Hypotonic bladder 10/30/2013   Atrophic vaginitis 10/30/2013   Prolapse of vaginal vault after hysterectomy 10/30/2013   Rectocele 10/30/2013   Sinusitis, chronic 10/30/2013   Urethral stricture 10/30/2013   Major depression, recurrent (HCC) 07/14/2013   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   Encounter for long-term (current) use of other medications 10/19/2012   Migraine 10/19/2012   Personality disorder (HCC) 10/19/2012   Hx of migraines 05/15/2009   History of blood transfusion 05/15/1968   Past Medical History:   Diagnosis Date   Acute bronchitis due to other specified organisms 04/21/2015   Acute hyponatremia 11/19/2020   Acute tension-type headache 12/16/2015   Adjustment disorder with anxious mood 04/21/2015   Allergic asthma with acute exacerbation 09/22/2014   Allergic rhinitis due to pollen 07/23/2014   Allergic urticaria 05/13/2015   Allergy    Alopecia 10/30/2013   Angina pectoris (HCC) 07/29/2019   Arthritis of carpometacarpal ([facility]) joint of left thumb 01/10/2022   Asthma    Atrophic vaginitis 10/30/2013   Basal cell carcinoma 04/21/2015   Bilateral impacted cerumen 04/21/2015   CAD (coronary artery disease) 10/28/2019   Cervical radiculopathy 09/17/2015   Change in bowel function 06/14/2016   Chest pain in adult 05/28/2017   Chest tightness 07/29/2019   Chronic diarrhea 12/16/2015   Colon polyps    Cough 02/14/2016   COVID-19 virus infection 11/19/2020   Cystocele, midline 10/30/2013   Dermatitis 09/22/2014   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   Diverticulosis of large intestine 10/30/2013   Drug-induced myopathy 09/12/2022   Dupuytren's contracture of both hands 09/17/2019   Dyspareunia 07/23/2014   Dysuria 12/16/2015   Encounter for long-term (current) use of other medications 10/19/2012   Esophageal dysphagia 06/14/2016   Essential hypertension    Family history of pheochromocytoma 02/14/2016   Foot pain, bilateral 01/05/2018   Gastroesophageal reflux disease    Gastroesophageal reflux disease  without esophagitis 05/13/2015   Generalized abdominal pain 12/16/2015   Generalized anxiety disorder 10/21/2021   GERD (gastroesophageal reflux disease)    H/O: hysterectomy    Herpes simplex 08/17/2016   History of adenomatous polyp of colon 06/14/2016   History of blood transfusion 1970   Hx of migraines 05/15/2009   Hyperlipemia 12/10/2020   Hypothyroidism 10/30/2013   Hypotonic bladder 10/30/2013   Insomnia 10/11/2017   Localized edema 12/16/2015   Low back pain  09/17/2015   Lumbar paraspinal muscle spasm 04/21/2015   Major depression, recurrent (HCC) 07/14/2013   Major depressive disorder, recurrent, mild (HCC) 03/14/2017   Malaise and fatigue 04/21/2015   MCI (mild cognitive impairment) 08/18/2015   Memory difficulty 09/17/2015   Menopause 03/31/2014   Midline low back pain without sciatica 03/31/2014   Migraine    Mild intermittent extrinsic asthma 12/16/2015   Mild persistent asthma 05/13/2015   Mixed dyslipidemia 11/12/2019   Nausea vomiting and diarrhea 11/19/2020   Neck pain 09/17/2015   Neuropathy 09/22/2014   Neutropenia, unspecified type (HCC) 07/07/2021   Nonintractable headache 11/02/2016   Other allergic rhinitis 05/13/2015   Palpitation 09/03/2017   Pedal edema 07/29/2019   Pelvic pain in female 02/17/2015   Personality disorder (HCC) 10/19/2012   Pharyngoesophageal dysphagia 04/21/2015   Plantar fasciitis 01/20/2016   Precordial chest pain 08/18/2015   Primary insomnia 10/11/2017   Prolapse of vaginal vault after hysterectomy 10/30/2013   Rectocele 10/30/2013   Seasonal allergies 10/15/2019   Senile purpura (HCC) 06/15/2020   Shin splint 04/21/2015   Shortness of breath 07/17/2016   Sinusitis, chronic 10/30/2013   Slow transit constipation 07/23/2014   SOB (shortness of breath) 11/19/2020   Stable angina (HCC) 10/28/2019   Swelling of lower limb 05/17/2016   Tension-type headache, not intractable 09/17/2015   Thyroid disease    Tinnitus of both ears 10/11/2017   Unstable angina (HCC) 07/29/2019   Urethral stricture 10/30/2013   Urinary frequency 02/23/2021   Urinary incontinence    Urinary incontinence without sensory awareness 07/17/2016   Varicose veins of both lower extremities 04/24/2016   Venous insufficiency (chronic) (peripheral) 06/06/2016   Past Surgical History:  Procedure Laterality Date   BREAST CYST ASPIRATION Left    25 years ago    COLONOSCOPY  04/2016   [facility] Diverticulitis and  Multiple colon polyps   CORONARY PRESSURE/FFR STUDY N/A 10/17/2019   Procedure: INTRAVASCULAR PRESSURE WIRE/FFR STUDY;  Surgeon: Rona Tomson Kendall, MD;  Location: [facility];  Service: Cardiovascular;  Laterality: N/A;   ESOPHAGOGASTRODUODENOSCOPY  04/2016   [facility]   LEFT HEART CATH AND CORONARY ANGIOGRAPHY N/A 10/17/2019   Procedure: LEFT HEART CATH AND CORONARY ANGIOGRAPHY;  Surgeon: Alfonse Garringer Kendall, MD;  Location: [facility];  Service: Cardiovascular;  Laterality: N/A;   MOHS SURGERY  10/05/2015   RECTOCELE REPAIR  2011   SKIN CANCER EXCISION  02/2015   Squamous cell removed from back   TONSILLECTOMY     VAGINAL HYSTERECTOMY     VAGINAL PROLAPSE REPAIR     Social History   Tobacco Use   Smoking status: Never   Smokeless tobacco: Never  Vaping Use   Vaping status: Never Used  Substance Use Topics   Alcohol use: Not Currently   Drug use: No   Social History   Socioeconomic History   Marital status: Divorced    Spouse name: Not on file   Number of children: Not on file   Years of  education: Not on file   Highest education level: Not on file  Occupational History   Not on file  Tobacco Use   Smoking status: Never   Smokeless tobacco: Never  Vaping Use   Vaping status: Never Used  Substance and Sexual Activity   Alcohol use: Not Currently   Drug use: No   Sexual activity: Never    Partners: Male  Other Topics Concern   Not on file  Social History Narrative   ** Merged History Encounter **       Social Drivers of Health   Financial Resource Strain: Low Risk  (02/22/2023)   Overall Financial Resource Strain (CARDIA)    Difficulty of Paying Living Expenses: Not hard at all  Food Insecurity: No Food Insecurity (02/22/2023)   Hunger Vital Sign    Worried About Running Out of Food in the Last Year: Never true    Ran Out of Food in the Last Year: Never true  Transportation Needs: No Transportation Needs (02/22/2023)   PRAPARE - Therapist, art (Medical): No    Lack of Transportation (Non-Medical): No  Physical Activity: Sufficiently Active (02/22/2023)   Exercise Vital Sign    Days of Exercise per Week: 7 days    Minutes of Exercise per Session: 30 min  Stress: No Stress Concern Present (02/22/2023)   [employer] of Occupational Health - Occupational Stress Questionnaire    Feeling of Stress : Only a little  Social Connections: Moderately Integrated (02/22/2023)   Social Connection and Isolation Panel [NHANES]    Frequency of Communication with Friends and Family: Three times a week    Frequency of Social Gatherings with Friends and Family: Once a week    Attends Religious Services: More than 4 times per year    Active Member of [employer] or Organizations: Yes    Attends Engineer, structural: More than 4 times per year    Marital Status: Divorced  Intimate Partner Violence: Not At Risk (02/22/2023)   Humiliation, Afraid, Rape, and Kick questionnaire    Fear of Current or Ex-Partner: No    Emotionally Abused: No    Physically Abused: No    Sexually Abused: No   Family Status  Relation Name Status   Father  Deceased   Mother  Deceased   Sister  Deceased   Daughter  Alive   Son  Alive   Neg Hx  (Not Specified)  No partnership data on file   Family History  Problem Relation Age of Onset   Asthma Father    Angina Father    Emphysema Father    Alzheimer's disease Mother    Cancer Sister        died of cancer   Colon cancer Neg Hx    Esophageal cancer Neg Hx       Review of Systems  Constitutional:  Negative for chills, fever and malaise/fatigue.  HENT:  Positive for congestion and sore throat. Negative for hearing loss.   Eyes:  Negative for blurred vision and discharge.  Respiratory:  Positive for cough, sputum production and wheezing. Negative for shortness of breath.   Cardiovascular:  Negative for chest pain, palpitations and leg swelling.  Gastrointestinal:  Negative  for abdominal pain, blood in stool, constipation, diarrhea, heartburn, nausea and vomiting.  Genitourinary:  Negative for dysuria, frequency, hematuria and urgency.  Musculoskeletal:  Negative for back pain, falls and myalgias.  Skin:  Negative for rash.  Neurological:  Negative  for dizziness, sensory change, loss of consciousness, weakness and headaches.  Endo/Heme/Allergies:  Negative for environmental allergies. Does not bruise/bleed easily.  Psychiatric/Behavioral:  Negative for depression and suicidal ideas. The patient is not nervous/anxious and does not have insomnia.       Objective:     BP 130/88 (BP Location: Right Arm, Patient Position: Sitting)   Pulse 60   Temp 97.9 F (36.6 C) (Oral)   Resp 20   Ht 5\' 6"  (1.676 m)   Wt 129 lb 12.8 oz (58.9 kg)   SpO2 98%   BMI 20.95 kg/m  BP Readings from Last 3 Encounters:  06/28/23 130/88  06/11/23 126/76  06/02/23 (!) 142/81   Wt Readings from Last 3 Encounters:  06/28/23 129 lb 12.8 oz (58.9 kg)  06/11/23 129 lb 3.2 oz (58.6 kg)  06/01/23 134 lb (60.8 kg)   SpO2 Readings from Last 3 Encounters:  06/28/23 98%  06/11/23 98%  06/02/23 99%      Physical Exam Vitals and nursing note reviewed.  Constitutional:      General: She is not in acute distress.    Appearance: Normal appearance. She is well-developed.  HENT:     Head: Normocephalic and atraumatic.     Right Ear: Tympanic membrane and ear canal normal.     Left Ear: Tympanic membrane and ear canal normal.     Nose: Congestion and rhinorrhea present.  Eyes:     General: No scleral icterus.       Right eye: No discharge.        Left eye: No discharge.  Cardiovascular:     Rate and Rhythm: Normal rate and regular rhythm.     Heart sounds: No murmur heard. Pulmonary:     Effort: Pulmonary effort is normal. No respiratory distress.     Breath sounds: No wheezing.  Musculoskeletal:        General: Normal range of motion.     Cervical back: Normal range of  motion and neck supple.     Right lower leg: No edema.     Left lower leg: No edema.  Skin:    General: Skin is warm and dry.  Neurological:     Mental Status: She is alert and oriented to person, place, and time.  Psychiatric:        Mood and Affect: Mood normal.        Behavior: Behavior normal.        Thought Content: Thought content normal.        Judgment: Judgment normal.      No results found for any visits on 06/28/23.  Last CBC Lab Results  Component Value Date   WBC 6.6 06/01/2023   HGB 12.3 06/01/2023   HCT 36.7 06/01/2023   MCV 90.0 06/01/2023   MCH 30.1 06/01/2023   RDW 12.6 06/01/2023   PLT 272 06/01/2023   Last metabolic panel Lab Results  Component Value Date   GLUCOSE 107 (H) 06/01/2023   NA 136 06/01/2023   K 3.6 06/01/2023   CL 102 06/01/2023   CO2 26 06/01/2023   BUN 14 06/01/2023   CREATININE 0.61 06/01/2023   GFRNONAA >60 06/01/2023   CALCIUM 8.7 (L) 06/01/2023   PHOS 2.5 11/20/2020   PROT 6.2 12/25/2022   ALBUMIN 3.9 12/25/2022   BILITOT 0.4 12/25/2022   ALKPHOS 51 12/25/2022   AST 15 12/25/2022   ALT 13 12/25/2022   ANIONGAP 8 06/01/2023   Last lipids Lab Results  Component Value Date   CHOL 173 09/12/2022   HDL 39.60 09/12/2022   LDLCALC 102 (H) 09/12/2022   TRIG 157.0 (H) 09/12/2022   CHOLHDL 4 09/12/2022   Last hemoglobin A1c No results found for: "HGBA1C" Last thyroid functions Lab Results  Component Value Date   TSH 1.448 11/19/2020   Last vitamin D Lab Results  Component Value Date   VD25OH 71.77 04/12/2017   Last vitamin B12 and Folate No results found for: "VITAMINB12", "FOLATE"    The 10-year ASCVD risk score (Arnett DK, et al., 2019) is: 17.6%    Assessment & Plan:   Problem List Items Addressed This Visit       Unprioritized   Cough   Relevant Medications   benzonatate (TESSALON) 100 MG capsule   Asthma   Relevant Medications   fluticasone-salmeterol (ADVAIR DISKUS) 250-50 MCG/ACT AEPB    Other Visit Diagnoses       Flu-like symptoms    -  Primary   Relevant Orders   POC COVID-19   POC Influenza A&B (Binax test)     Bronchitis       Relevant Medications   azithromycin (ZITHROMAX Z-PAK) 250 MG tablet     Assessment and Plan    Acute Sinusitis Nasal congestion, increased mucus production, and blood in mucus have worsened over the past week. Chronic sinusitis was previously treated with doxycycline, but due to lack of improvement, azithromycin is now considered. Azithromycin is effective for bacterial sinusitis and generally well-tolerated. Prescribe Z-Pak (azithromycin).  Asthma Exacerbation Increased coughing and throat soreness suggest an asthma exacerbation. Current medications include Advair and Proair inhalers, with a need for a new Advair prescription. Tessalon Perles is used for cough management. Emphasize maintaining asthma control and consider additional medications if symptoms persist. Send prescriptions for Advair and Tessalon Perles.  Allergic Rhinitis Symptoms of itchy eyes, nasal congestion, and throat discomfort align with allergic rhinitis. Nasonex and Astelin nasal sprays are used to manage symptoms, reduce inflammation, and improve breathing. Ensure an adequate supply of Nasonex and Astelin.  General Health Maintenance Calcium supplements and medications for PVD and cataracts are taken regularly. A humidifier and air purifier help manage the environment. Continue current medications and environmental management to maintain a clean, allergen-free environment.  Follow-up Monitor symptoms and avoid contact with others.        Return if symptoms worsen or fail to improve.    Donato Schultz, DO

## 2023-07-02 ENCOUNTER — Ambulatory Visit: Payer: PPO | Admitting: Cardiology

## 2023-07-19 NOTE — Telephone Encounter (Signed)
 Spoke to patient, she was having trouble tolerating Imdur and she has stopped taking it for at least a month now. She wants Dr. Ladona Ridgel and Dr. Tomie China to know about this change.

## 2023-09-04 ENCOUNTER — Other Ambulatory Visit: Payer: Self-pay

## 2023-09-04 DIAGNOSIS — M8580 Other specified disorders of bone density and structure, unspecified site: Secondary | ICD-10-CM

## 2023-09-07 ENCOUNTER — Ambulatory Visit: Admitting: Physician Assistant

## 2023-09-07 ENCOUNTER — Encounter: Payer: Self-pay | Admitting: Physician Assistant

## 2023-09-07 VITALS — BP 118/67 | HR 67 | Temp 98.3°F | Ht 66.0 in | Wt 128.4 lb

## 2023-09-07 DIAGNOSIS — M545 Low back pain, unspecified: Secondary | ICD-10-CM | POA: Diagnosis not present

## 2023-09-07 DIAGNOSIS — T3695XA Adverse effect of unspecified systemic antibiotic, initial encounter: Secondary | ICD-10-CM

## 2023-09-07 DIAGNOSIS — R8271 Bacteriuria: Secondary | ICD-10-CM

## 2023-09-07 DIAGNOSIS — B379 Candidiasis, unspecified: Secondary | ICD-10-CM

## 2023-09-07 LAB — POC URINALSYSI DIPSTICK (AUTOMATED)
Bilirubin, UA: NEGATIVE
Blood, UA: NEGATIVE
Glucose, UA: NEGATIVE
Ketones, UA: NEGATIVE
Nitrite, UA: NEGATIVE
Protein, UA: NEGATIVE
Spec Grav, UA: <=1.005 — AB (ref 1.010–1.025)
Urobilinogen, UA: 0.2 U/dL
pH, UA: 7.5 (ref 5.0–8.0)

## 2023-09-07 MED ORDER — FLUCONAZOLE 150 MG PO TABS
150.0000 mg | ORAL_TABLET | Freq: Once | ORAL | 0 refills | Status: AC
Start: 2023-09-07 — End: 2023-09-07

## 2023-09-07 NOTE — Progress Notes (Signed)
 Established patient visit   Patient: Jenna Campbell   DOB: 26-Dec-1950   73 y.o. Female  MRN: [id number] Visit Date: 09/07/2023  Today's healthcare provider: Trenton Frock, PA-C   Cc. Cold symptoms, right leg/back pain  Subjective     The patient, with a history of asthma, presents with respiratory symptoms and leg swelling. The patient reports a persistent cough, nasal congestion for the last 2-3 days. She was recently started on doxycycline  after a dental procedure.  The patient also reports pain and swelling in bilateral legs, R worse than left. Her pain starts in her back and radiates down her right leg, described as a burning. She attributes to moving heavy items and the weather. T  The patient also mentions emotional stress due to a recent relationship issue. The patient is seeing a counselor for this issue.   Medications: Outpatient Medications Prior to Visit  Medication Sig   doxycycline  (MONODOX ) 100 MG capsule Take 100 mg by mouth 2 (two) times daily.   acetaminophen  (TYLENOL ) 500 MG tablet Take 500 mg by mouth every 6 (six) hours as needed for moderate pain.   albuterol  (VENTOLIN  HFA) 108 (90 Base) MCG/ACT inhaler Inhale 2 puffs into the lungs every 4 (four) hours as needed for wheezing or shortness of breath.   ASPIRIN  81 PO Take 81 mg by mouth daily.    benzonatate  (TESSALON ) 100 MG capsule Take 1 capsule (100 mg total) by mouth 2 (two) times daily as needed for cough.   betamethasone  valerate (VALISONE ) 0.1 % cream APPLY TO AFFECTED AREA TWICE A DAY (Patient taking differently: Apply 1 Application topically daily.)   Calcium -Phosphorus-Vitamin D  (CALCIUM  GUMMIES PO) Take 1 Piece of gum by mouth daily.   cholecalciferol  (VITAMIN D3) 25 MCG (1000 UNIT) tablet Take 1,000 Units by mouth daily.   Coenzyme Q10 (CO Q-10) 200 MG CAPS Take 200 mg by mouth daily.   ezetimibe  (ZETIA ) 10 MG tablet Take 1 tablet (10 mg total) by mouth daily.   fluticasone  (FLONASE ) 50 MCG/ACT  nasal spray Place 2 sprays into both nostrils as needed for rhinitis or allergies.   fluticasone -salmeterol (ADVAIR DISKUS) 250-50 MCG/ACT AEPB TAKE 1 PUFF BY MOUTH TWICE A DAY   furosemide  (LASIX ) 40 MG tablet Take 1 tablet (40 mg total) by mouth daily.   gabapentin  (NEURONTIN ) 300 MG capsule Take 300 mg by mouth in the morning.   ipratropium-albuterol  (DUONEB) 0.5-2.5 (3) MG/3ML SOLN Take 3 mLs by nebulization every 4 (four) hours as needed.   isosorbide  mononitrate (IMDUR ) 30 MG 24 hr tablet Take 0.5 tablets (15 mg total) by mouth daily. Please split the tablet in half and take 1/2 tablet once daily in AM.   levothyroxine  (SYNTHROID ) 150 MCG tablet TAKE 1.5 TABLETS (225 MCG TOTAL) BY MOUTH DAILY BEFORE BREAKFAST.   LORazepam  (ATIVAN ) 0.5 MG tablet Take 0.5 mg by mouth every 8 (eight) hours as needed for anxiety.    Multiple Vitamins-Minerals (PRESERVISION AREDS 2 PO) Take 1 tablet by mouth daily.   olopatadine  (PATANOL) 0.1 % ophthalmic solution Place 1 drop into both eyes 2 (two) times daily.   OVER THE COUNTER MEDICATION Take 2 tablets by mouth daily as needed (to prevent UTIs). [place] Three times a day. No longer than three days   pantoprazole  (PROTONIX ) 40 MG tablet TAKE 1 TABLET BY MOUTH TWICE A DAY   potassium chloride  (KLOR-CON ) 10 MEQ tablet TAKE 2 TABLETS WITH EVERY DOSE OF LASIX  40 MG.   temazepam  (RESTORIL )  15 MG capsule Take 15 mg by mouth at bedtime.    topiramate  (TOPAMAX ) 100 MG tablet Take 100-200 mg by mouth at bedtime.   valACYclovir  (VALTREX ) 1000 MG tablet TAKE 1 TABLET BY MOUTH ONCE DAILY FOR 5 DAYS DURING OUTBREAKS.   [DISCONTINUED] azithromycin  (ZITHROMAX  Z-PAK) 250 MG tablet As directed   No facility-administered medications prior to visit.    Review of Systems  Constitutional:  Negative for fatigue and fever.  HENT:  Positive for congestion.   Respiratory:  Positive for cough. Negative for shortness of breath.   Cardiovascular:  Positive for leg swelling. Negative  for chest pain.  Gastrointestinal:  Negative for abdominal pain.  Neurological:  Negative for dizziness and headaches.       Objective    BP 118/67   Pulse 67   Temp 98.3 F (36.8 C)   Ht 5\' 6"  (1.676 m)   Wt 128 lb 6.4 oz (58.2 kg)   SpO2 98%   BMI 20.72 kg/m    Physical Exam Constitutional:      General: She is awake.     Appearance: She is well-developed.  HENT:     Head: Normocephalic.  Eyes:     Conjunctiva/sclera: Conjunctivae normal.  Cardiovascular:     Rate and Rhythm: Normal rate and regular rhythm.     Heart sounds: Normal heart sounds.  Pulmonary:     Effort: Pulmonary effort is normal.     Breath sounds: Normal breath sounds.  Musculoskeletal:     Comments: Non pitting edema b/l LE , R slightly worse than left.   Skin:    General: Skin is warm.  Neurological:     Mental Status: She is alert and oriented to person, place, and time.  Psychiatric:        Attention and Perception: Attention normal.        Mood and Affect: Mood normal.        Speech: Speech normal.        Behavior: Behavior is cooperative.      Results for orders placed or performed in visit on 09/07/23  POCT Urinalysis Dipstick (Automated)  Result Value Ref Range   Color, UA yellow    Clarity, UA clear    Glucose, UA Negative Negative   Bilirubin, UA neg    Ketones, UA neg    Spec Grav, UA <=1.005 (A) 1.010 - 1.025   Blood, UA neg    pH, UA 7.5 5.0 - 8.0   Protein, UA Negative Negative   Urobilinogen, UA 0.2 0.2 or 1.0 E.U./dL   Nitrite, UA neg    Leukocytes, UA Small (1+) (A) Negative    Assessment & Plan    Right low back pain, unspecified chronicity, unspecified whether sciatica present -     POCT Urinalysis Dipstick (Automated)  Antibiotic-induced yeast infection -     Fluconazole ; Take 1 tablet (150 mg total) by mouth once for 1 dose.  Dispense: 1 tablet; Refill: 0  Bacteria in urine -     Urine Culture   Pt anticipates a yeast infection from doxycycline , sent  in diflucan .   UA done for back pain -- + leuks, sent for culture.  Recommending increasing gabapentin  dose to 300 mg TID , or 600 mg in AM, 300 in PM, etc.   Lungs clear, no concern for asthma exacerbation or bronchitis. Cont inhalers as prescribed.   Leg swelling is stable.   Return if symptoms worsen or fail to improve.  Trenton Frock, PA-C  [facility] [phone] (phone) [phone] (fax)  [facility]

## 2023-09-08 LAB — URINE CULTURE
MICRO NUMBER:: 16376397
SPECIMEN QUALITY:: ADEQUATE

## 2023-09-10 ENCOUNTER — Encounter: Payer: Self-pay | Admitting: Physician Assistant

## 2023-09-20 ENCOUNTER — Telehealth (HOSPITAL_BASED_OUTPATIENT_CLINIC_OR_DEPARTMENT_OTHER): Payer: Self-pay

## 2023-09-24 ENCOUNTER — Other Ambulatory Visit: Payer: Self-pay | Admitting: Family Medicine

## 2023-09-24 ENCOUNTER — Encounter: Payer: Self-pay | Admitting: Family Medicine

## 2023-09-24 DIAGNOSIS — E079 Disorder of thyroid, unspecified: Secondary | ICD-10-CM

## 2023-09-24 NOTE — Telephone Encounter (Signed)
 Copied from CRM [id number]. Topic: Clinical - Medication Refill >> Sep 24, 2023  4:42 PM Luane Rumps D wrote: Medication: levothyroxine  (SYNTHROID ) 150 MCG tablet  Has the patient contacted their pharmacy? No (Agent: If no, request that the patient contact the pharmacy for the refill. If patient does not wish to contact the pharmacy document the reason why and proceed with request.) (Agent: If yes, when and what did the pharmacy advise?)  This is the patient's preferred pharmacy:   [id number] - [place], [place] - [address], [address] AT [facility] [address], [address] [place] [address] Phone: [phone] Fax: [phone]  Is this the correct pharmacy for this prescription? Yes If no, delete pharmacy and type the correct one.   Has the prescription been filled recently? No  Is the patient out of the medication? Yes  Has the patient been seen for an appointment in the last year OR does the patient have an upcoming appointment? Yes  Can we respond through MyChart? Yes  Agent: Please be advised that Rx refills may take up to 3 business days. We ask that you follow-up with your pharmacy.    Has the patient contacted their pharmacy? No (Agent: If no, request that the patient contact the pharmacy for the refill. If patient does not wish to contact the pharmacy document the reason why and proceed with request.) (Agent: If yes, when and what did the pharmacy advise?)  This is the patient's preferred pharmacy:   [id number] [place], [place] - [address] [place] [address] Phone: [phone] Fax: [phone]  Is this the correct pharmacy for this prescription? Yes If no, delete pharmacy and type the correct one.   Has the prescription been filled recently? No  Is the patient out of the medication? No  Has the patient been seen for an appointment in the last year OR does the patient have an upcoming  appointment? Yes  Can we respond through MyChart? Yes  Agent: Please be advised that Rx refills may take up to 3 business days. We ask that you follow-up with your pharmacy.

## 2023-09-24 NOTE — Telephone Encounter (Signed)
 Last Fill: 08/15/21  Last OV: 09/07/23 Next OV: 02/26/24 AWV  Routing to provider for review/authorization.

## 2023-09-25 MED ORDER — LEVOTHYROXINE SODIUM 150 MCG PO TABS: 225.0000 ug | ORAL_TABLET | Freq: Every day | ORAL | 2 refills | Status: DC

## 2023-09-25 NOTE — Telephone Encounter (Signed)
 Tried calling pt back to let her know what provider said that its not anything personal. He just isn't taking new pt at times time.

## 2023-10-01 ENCOUNTER — Other Ambulatory Visit (HOSPITAL_BASED_OUTPATIENT_CLINIC_OR_DEPARTMENT_OTHER)

## 2023-10-17 DIAGNOSIS — F4322 Adjustment disorder with anxiety: Secondary | ICD-10-CM | POA: Diagnosis not present

## 2023-10-17 DIAGNOSIS — F5101 Primary insomnia: Secondary | ICD-10-CM | POA: Diagnosis not present

## 2023-10-17 DIAGNOSIS — F411 Generalized anxiety disorder: Secondary | ICD-10-CM | POA: Diagnosis not present

## 2023-10-17 DIAGNOSIS — F330 Major depressive disorder, recurrent, mild: Secondary | ICD-10-CM | POA: Diagnosis not present

## 2023-11-06 ENCOUNTER — Encounter: Payer: Self-pay | Admitting: Family Medicine

## 2023-11-06 ENCOUNTER — Ambulatory Visit (HOSPITAL_BASED_OUTPATIENT_CLINIC_OR_DEPARTMENT_OTHER)
Admission: RE | Admit: 2023-11-06 | Discharge: 2023-11-06 | Disposition: A | Discharge status: Home / Self Care | Source: Ambulatory Visit | Attending: Family Medicine | Admitting: Family Medicine

## 2023-11-06 ENCOUNTER — Ambulatory Visit: Payer: Self-pay | Admitting: Family Medicine

## 2023-11-06 DIAGNOSIS — M8580 Other specified disorders of bone density and structure, unspecified site: Secondary | ICD-10-CM | POA: Insufficient documentation

## 2023-11-06 DIAGNOSIS — Z13820 Encounter for screening for osteoporosis: Secondary | ICD-10-CM | POA: Diagnosis not present

## 2023-11-06 DIAGNOSIS — M810 Age-related osteoporosis without current pathological fracture: Secondary | ICD-10-CM | POA: Insufficient documentation

## 2023-11-06 DIAGNOSIS — Z780 Asymptomatic menopausal state: Secondary | ICD-10-CM | POA: Diagnosis not present

## 2023-11-25 ENCOUNTER — Encounter: Payer: Self-pay | Admitting: Family Medicine

## 2023-12-05 ENCOUNTER — Ambulatory Visit: Admitting: Family Medicine

## 2023-12-05 ENCOUNTER — Encounter: Payer: Self-pay | Admitting: Pharmacist

## 2023-12-05 ENCOUNTER — Encounter: Payer: Self-pay | Admitting: Family Medicine

## 2023-12-05 VITALS — BP 120/68 | HR 69 | Temp 98.0°F | Resp 16 | Ht 66.0 in | Wt 128.2 lb

## 2023-12-05 DIAGNOSIS — M549 Dorsalgia, unspecified: Secondary | ICD-10-CM

## 2023-12-05 DIAGNOSIS — R300 Dysuria: Secondary | ICD-10-CM | POA: Diagnosis not present

## 2023-12-05 DIAGNOSIS — M810 Age-related osteoporosis without current pathological fracture: Secondary | ICD-10-CM

## 2023-12-05 DIAGNOSIS — G720 Drug-induced myopathy: Secondary | ICD-10-CM | POA: Diagnosis not present

## 2023-12-05 MED ORDER — NITROFURANTOIN MONOHYD MACRO 100 MG PO CAPS: 100.0000 mg | ORAL_CAPSULE | Freq: Two times a day (BID) | ORAL | 0 refills | Status: AC

## 2023-12-05 NOTE — Progress Notes (Signed)
 No chief complaint on file.   Subjective: Patient is a 73 y.o. female here for f/u DEXA scan.  Osteoporosis in the forearm. Not taking any medication. Takes 3000 u of Vit d and 1200 mg of Vit D daily. Cycling for exercise. Diet is fair. No fx's.   UTI- 1 week of dysuria, freq, urgency, incomplete emptying. No bleeding or DC, fevers, N/V, flank pain. Started AZO at home. No new sexual partners.   Back pain over the past several week. No inj or change in activity. Stretches from back of neck to low back. Achy in nature. Radiating to both legs intermittently. Not getting better. Has been using ice, heat, Tylenol .   Past Medical History:  Diagnosis Date   Acute bronchitis due to other specified organisms 04/21/2015   Acute hyponatremia 11/19/2020   Acute tension-type headache 12/16/2015   Adjustment disorder with anxious mood 04/21/2015   Allergic asthma with acute exacerbation 09/22/2014   Allergic rhinitis due to pollen 07/23/2014   Allergic urticaria 05/13/2015   Allergy    Alopecia 10/30/2013   Angina pectoris (HCC) 07/29/2019   Arthritis of carpometacarpal (CMC) joint of left thumb 01/10/2022   Asthma    Atrophic vaginitis 10/30/2013   Basal cell carcinoma 04/21/2015   Bilateral impacted cerumen 04/21/2015   CAD (coronary artery disease) 10/28/2019   Cervical radiculopathy 09/17/2015   Change in bowel function 06/14/2016   Chest pain in adult 05/28/2017   Chest tightness 07/29/2019   Chronic diarrhea 12/16/2015   Colon polyps    Cough 02/14/2016   COVID-19 virus infection 11/19/2020   Cystocele, midline 10/30/2013   Dermatitis 09/22/2014   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   Diverticulosis of large intestine 10/30/2013   Drug-induced myopathy 09/12/2022   Dupuytren's contracture of both hands 09/17/2019   Dyspareunia 07/23/2014   Dysuria 12/16/2015   Encounter for long-term (current) use of other medications 10/19/2012   Esophageal dysphagia 06/14/2016   Essential  hypertension    Family history of pheochromocytoma 02/14/2016   Foot pain, bilateral 01/05/2018   Gastroesophageal reflux disease    Gastroesophageal reflux disease without esophagitis 05/13/2015   Generalized abdominal pain 12/16/2015   Generalized anxiety disorder 10/21/2021   GERD (gastroesophageal reflux disease)    H/O: hysterectomy    Herpes simplex 08/17/2016   History of adenomatous polyp of colon 06/14/2016   History of blood transfusion 1970   Hx of migraines 05/15/2009   Hyperlipemia 12/10/2020   Hypothyroidism 10/30/2013   Hypotonic bladder 10/30/2013   Insomnia 10/11/2017   Localized edema 12/16/2015   Low back pain 09/17/2015   Lumbar paraspinal muscle spasm 04/21/2015   Major depression, recurrent (HCC) 07/14/2013   Major depressive disorder, recurrent, mild (HCC) 03/14/2017   Malaise and fatigue 04/21/2015   MCI (mild cognitive impairment) 08/18/2015   Memory difficulty 09/17/2015   Menopause 03/31/2014   Midline low back pain without sciatica 03/31/2014   Migraine    Mild intermittent extrinsic asthma 12/16/2015   Mild persistent asthma 05/13/2015   Mixed dyslipidemia 11/12/2019   Nausea vomiting and diarrhea 11/19/2020   Neck pain 09/17/2015   Neuropathy 09/22/2014   Neutropenia, unspecified type (HCC) 07/07/2021   Nonintractable headache 11/02/2016   Osteoporosis    Other allergic rhinitis 05/13/2015   Palpitation 09/03/2017   Pedal edema 07/29/2019   Pelvic pain in female 02/17/2015   Personality disorder (HCC) 10/19/2012   Pharyngoesophageal dysphagia 04/21/2015   Plantar fasciitis 01/20/2016   Precordial chest pain 08/18/2015   Primary insomnia 10/11/2017  Prolapse of vaginal vault after hysterectomy 10/30/2013   Rectocele 10/30/2013   Seasonal allergies 10/15/2019   Senile purpura (HCC) 06/15/2020   Shin splint 04/21/2015   Shortness of breath 07/17/2016   Sinusitis, chronic 10/30/2013   Slow transit constipation 07/23/2014   SOB  (shortness of breath) 11/19/2020   Stable angina (HCC) 10/28/2019   Swelling of lower limb 05/17/2016   Tension-type headache, not intractable 09/17/2015   Thyroid  disease    Tinnitus of both ears 10/11/2017   Unstable angina (HCC) 07/29/2019   Urethral stricture 10/30/2013   Urinary frequency 02/23/2021   Urinary incontinence    Urinary incontinence without sensory awareness 07/17/2016   Varicose veins of both lower extremities 04/24/2016   Venous insufficiency (chronic) (peripheral) 06/06/2016    Objective: BP 120/68 (BP Location: Left Arm, Patient Position: Sitting)   Pulse 69   Temp 98 F (36.7 C) (Oral)   Resp 16   Ht 5' 6 (1.676 m)   Wt 128 lb 3.2 oz (58.2 kg)   SpO2 98%   BMI 20.69 kg/m  General: Awake, appears stated age Heart: RRR, no LE edema Mouth: MMM Abd: BS+, S, ND, TTp in suprapubic region MSK: No ttp over CVA b/l Lungs: CTAB, no rales, wheezes or rhonchi. No accessory muscle use Psych: Age appropriate judgment and insight, normal affect and mood  Assessment and Plan: Age-related osteoporosis without current pathological fracture  Dysuria - Plan: Urine Culture, Urine Microscopic Only  Drug-induced myopathy  Chronic, not ideally controlled. Ck Vit D, Ca. If nml, will start Fosamax  70 mg/week. If low, will start weekly 50,000 u weekly D3. Counseled on exercise.  Ck urine. Start Macrobid  100 mg bid for 7 d. Stay hydrated. Hold off on statins for now.  F/u as originally scheduled.  The patient voiced understanding and agreement to the plan.  Jenna [place], DO 12/05/23  3:48 PM

## 2023-12-05 NOTE — Progress Notes (Addendum)
 Pharmacy Quality Measure Review  This patient is appearing on 2024 report for failing the measure for Statin Therapy for Patients with Cardiovascular Disease [facility]).   Jenna Campbell has documented CAD but unable to take statin.  She has tried atorvastatin , pravastatin and rosuvastatin . She has documented adverse reaction of  myalgias with all 3 statins.   Lab Results  Component Value Date   CHOL 173 09/12/2022   HDL 39.60 09/12/2022   LDLCALC 102 (H) 09/12/2022   TRIG 157.0 (H) 09/12/2022   CHOLHDL 4 09/12/2022   Patient has an appointment today to see Dr Frann to review DEXA. Will send note to consider rechecking lipids and using statin exclusion code if appropriate.   Madelin Ray, PharmD Clinical Pharmacist [facility] [place] [facility]   12/12/2023- Addendum Reviewed visit from 12/05/2023 with Dr Frann. Used exclusion code for SUPD -  drug induced myalgias.  SUPD gap should close.   Madelin Ray, PharmD Clinical Pharmacist [place] [facility]

## 2023-12-05 NOTE — Patient Instructions (Addendum)
 Give us  2-3 business days to get the results of your labs back.   Aim to do some physical exertion for 150 minutes per week. This is typically divided into 5 days per week, 30 minutes per day. The activity should be enough to get your heart rate up. Anything is better than nothing if you have time constraints.  Heat (pad or rice pillow in microwave) over affected area, 10-15 minutes twice daily.   Start the exercises/stretches for your back again.   Continue the calcium  and Vit D supplementation.   Let us  know if you need anything.

## 2023-12-06 ENCOUNTER — Ambulatory Visit: Payer: Self-pay | Admitting: Family Medicine

## 2023-12-06 LAB — COMPREHENSIVE METABOLIC PANEL WITH GFR
ALT: 9 U/L (ref 0–35)
AST: 15 U/L (ref 0–37)
Albumin: 4.1 g/dL (ref 3.5–5.2)
Alkaline Phosphatase: 53 U/L (ref 39–117)
BUN: 13 mg/dL (ref 6–23)
CO2: 27 meq/L (ref 19–32)
Calcium: 8.6 mg/dL (ref 8.4–10.5)
Chloride: 99 meq/L (ref 96–112)
Creatinine, Ser: 0.68 mg/dL (ref 0.40–1.20)
GFR: 86.15 mL/min (ref >60.00)
Glucose, Bld: 100 mg/dL — ABNORMAL HIGH (ref 70–99)
Potassium: 3.5 meq/L (ref 3.5–5.1)
Sodium: 132 meq/L — ABNORMAL LOW (ref 135–145)
Total Bilirubin: 0.6 mg/dL (ref 0.2–1.2)
Total Protein: 6.9 g/dL (ref 6.0–8.3)

## 2023-12-06 LAB — VITAMIN D 25 HYDROXY (VIT D DEFICIENCY, FRACTURES): VITD: 43.37 ng/mL (ref 30.00–100.00)

## 2023-12-06 LAB — URINALYSIS, MICROSCOPIC ONLY: Bacteria, UA: NONE SEEN

## 2023-12-06 MED ORDER — ALENDRONATE SODIUM 70 MG PO TABS: 70.0000 mg | ORAL_TABLET | ORAL | 11 refills | Status: AC

## 2023-12-07 ENCOUNTER — Encounter: Payer: Self-pay | Admitting: Family Medicine

## 2023-12-07 LAB — URINE CULTURE
MICRO NUMBER:: 16735779
SPECIMEN QUALITY:: ADEQUATE

## 2023-12-14 ENCOUNTER — Other Ambulatory Visit: Payer: Self-pay | Admitting: Family Medicine

## 2023-12-23 ENCOUNTER — Encounter: Payer: Self-pay | Admitting: Family Medicine

## 2023-12-25 ENCOUNTER — Encounter: Payer: Self-pay | Admitting: Family Medicine

## 2023-12-25 ENCOUNTER — Ambulatory Visit (INDEPENDENT_AMBULATORY_CARE_PROVIDER_SITE_OTHER): Admitting: Family Medicine

## 2023-12-25 VITALS — BP 120/64 | HR 60 | Temp 98.0°F | Resp 16 | Ht 66.0 in | Wt 129.8 lb

## 2023-12-25 DIAGNOSIS — M545 Low back pain, unspecified: Secondary | ICD-10-CM

## 2023-12-25 DIAGNOSIS — G8929 Other chronic pain: Secondary | ICD-10-CM | POA: Diagnosis not present

## 2023-12-25 DIAGNOSIS — R0789 Other chest pain: Secondary | ICD-10-CM | POA: Diagnosis not present

## 2023-12-25 DIAGNOSIS — R300 Dysuria: Secondary | ICD-10-CM | POA: Diagnosis not present

## 2023-12-25 MED ORDER — DULOXETINE HCL 20 MG PO CPEP: 20.0000 mg | ORAL_CAPSULE | Freq: Every day | ORAL | 3 refills | Status: DC

## 2023-12-25 MED ORDER — CEFDINIR 300 MG PO CAPS: 300.0000 mg | ORAL_CAPSULE | Freq: Two times a day (BID) | ORAL | 0 refills | Status: AC

## 2023-12-25 MED ORDER — FLUCONAZOLE 150 MG PO TABS: ORAL_TABLET | ORAL | 0 refills | Status: DC

## 2023-12-25 NOTE — Patient Instructions (Signed)
 Stay hydrated.   Warning signs/symptoms: Uncontrollable nausea/vomiting, fevers, worsening symptoms despite treatment, confusion.  Give us  around 2 business days to get culture back to you.  If chest symptoms persist, please follow up with Dr. Monetta.  Let us  know if you need anything.

## 2023-12-25 NOTE — Progress Notes (Signed)
 Chief Complaint  Patient presents with   Dysuria    Dysuria    Jenna Campbell is a 73 y.o. female here for possible UTI.  Duration: 4 days. Symptoms: Dysuria, urinary frequency, urinary retention, and fatigue, flank pain on R, R lower abd pain, nausea, urgency Denies: hematuria, urinary hesitancy, fever, vomiting, vaginal discharge Hx of recurrent UTI? No Denies new sexual partners. Tx: Azo, Monostat Nitrofurantoin  made her feel poorly.   Patient is that upper back pain and shortness of breath over the past several weeks.  Does not seem to be exertional always.  She does follow with Dr. Beverley.  Denies any trauma.  She did start wearing undergarments going around her upper abdominal region.  She is wondering if that played a role.  No current chest pain.  Patient has a history of low back pain with radiation to her legs.  She was on Cymbalta  before and it worked well.  She is requesting a refill.  Past Medical History:  Diagnosis Date   Acute bronchitis due to other specified organisms 04/21/2015   Acute hyponatremia 11/19/2020   Acute tension-type headache 12/16/2015   Adjustment disorder with anxious mood 04/21/2015   Allergic asthma with acute exacerbation 09/22/2014   Allergic rhinitis due to pollen 07/23/2014   Allergic urticaria 05/13/2015   Allergy    Alopecia 10/30/2013   Angina pectoris (HCC) 07/29/2019   Arthritis of carpometacarpal (CMC) joint of left thumb 01/10/2022   Asthma    Atrophic vaginitis 10/30/2013   Basal cell carcinoma 04/21/2015   Bilateral impacted cerumen 04/21/2015   CAD (coronary artery disease) 10/28/2019   Cervical radiculopathy 09/17/2015   Change in bowel function 06/14/2016   Chest pain in adult 05/28/2017   Chest tightness 07/29/2019   Chronic diarrhea 12/16/2015   Colon polyps    Cough 02/14/2016   COVID-19 virus infection 11/19/2020   Cystocele, midline 10/30/2013   Dermatitis 09/22/2014   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    Diverticulosis of large intestine 10/30/2013   Drug-induced myopathy 09/12/2022   Dupuytren's contracture of both hands 09/17/2019   Dyspareunia 07/23/2014   Dysuria 12/16/2015   Encounter for long-term (current) use of other medications 10/19/2012   Esophageal dysphagia 06/14/2016   Essential hypertension    Family history of pheochromocytoma 02/14/2016   Foot pain, bilateral 01/05/2018   Gastroesophageal reflux disease    Gastroesophageal reflux disease without esophagitis 05/13/2015   Generalized abdominal pain 12/16/2015   Generalized anxiety disorder 10/21/2021   GERD (gastroesophageal reflux disease)    H/O: hysterectomy    Herpes simplex 08/17/2016   History of adenomatous polyp of colon 06/14/2016   History of blood transfusion 1970   Hx of migraines 05/15/2009   Hyperlipemia 12/10/2020   Hypothyroidism 10/30/2013   Hypotonic bladder 10/30/2013   Insomnia 10/11/2017   Localized edema 12/16/2015   Low back pain 09/17/2015   Lumbar paraspinal muscle spasm 04/21/2015   Major depression, recurrent (HCC) 07/14/2013   Major depressive disorder, recurrent, mild (HCC) 03/14/2017   Malaise and fatigue 04/21/2015   MCI (mild cognitive impairment) 08/18/2015   Memory difficulty 09/17/2015   Menopause 03/31/2014   Midline low back pain without sciatica 03/31/2014   Migraine    Mild intermittent extrinsic asthma 12/16/2015   Mild persistent asthma 05/13/2015   Mixed dyslipidemia 11/12/2019   Nausea vomiting and diarrhea 11/19/2020   Neck pain 09/17/2015   Neuropathy 09/22/2014   Neutropenia, unspecified type (HCC) 07/07/2021   Nonintractable headache 11/02/2016   Osteoporosis  Other allergic rhinitis 05/13/2015   Palpitation 09/03/2017   Pedal edema 07/29/2019   Pelvic pain in female 02/17/2015   Personality disorder (HCC) 10/19/2012   Pharyngoesophageal dysphagia 04/21/2015   Plantar fasciitis 01/20/2016   Precordial chest pain 08/18/2015   Primary insomnia  10/11/2017   Prolapse of vaginal vault after hysterectomy 10/30/2013   Rectocele 10/30/2013   Seasonal allergies 10/15/2019   Senile purpura (HCC) 06/15/2020   Shin splint 04/21/2015   Shortness of breath 07/17/2016   Sinusitis, chronic 10/30/2013   Slow transit constipation 07/23/2014   SOB (shortness of breath) 11/19/2020   Stable angina (HCC) 10/28/2019   Swelling of lower limb 05/17/2016   Tension-type headache, not intractable 09/17/2015   Thyroid  disease    Tinnitus of both ears 10/11/2017   Unstable angina (HCC) 07/29/2019   Urethral stricture 10/30/2013   Urinary frequency 02/23/2021   Urinary incontinence    Urinary incontinence without sensory awareness 07/17/2016   Varicose veins of both lower extremities 04/24/2016   Venous insufficiency (chronic) (peripheral) 06/06/2016     BP 120/64 (BP Location: Left Arm, Patient Position: Sitting)   Pulse 60   Temp 98 F (36.7 C) (Oral)   Resp 16   Ht 5' 6 (1.676 m)   Wt 129 lb 12.8 oz (58.9 kg)   SpO2 96%   BMI 20.95 kg/m  General: Awake, alert, appears stated age Heart: RRR Lungs: CTAB, normal respiratory effort, no accessory muscle usage Abd: BS+, soft, mild TTP over the suprapubic region, ND, no masses or organomegaly MSK: No CVA tenderness, neg Lloyd's sign; no TTP over the anterior chest wall Psych: Age appropriate judgment and insight  Dysuria - Plan: cefdinir  (OMNICEF ) 300 MG capsule, fluconazole  (DIFLUCAN ) 150 MG tablet  Atypical chest pain - Plan: EKG 12-Lead  Chronic bilateral low back pain, unspecified whether sciatica present - Plan: DULoxetine  (CYMBALTA ) 20 MG capsule  Omnicef  as above.  She has tolerated this medicine well in the past.  She does have some flank pain, will cover for pyelonephritis with this.  Stay hydrated. Seek immediate care if pt starts to develop fevers, new/worsening symptoms, uncontrollable N/V. EKG today. EKG shows NSR, normal axis, no interval abnormalities, no ST segment or T  wave changes, good R wave progression.  If symptoms persist, she will follow-up with Dr. Monetta. Chronic, not controlled.  Start duloxetine  20 mg daily.  Follow-up in a month recheck. The patient voiced understanding and agreement to the plan.  Mabel Mt [place], DO 12/25/23 2:27 PM

## 2023-12-27 ENCOUNTER — Other Ambulatory Visit

## 2023-12-28 ENCOUNTER — Other Ambulatory Visit: Payer: Self-pay | Admitting: Family Medicine

## 2023-12-28 ENCOUNTER — Ambulatory Visit: Payer: Self-pay | Admitting: *Deleted

## 2023-12-28 DIAGNOSIS — B009 Herpesviral infection, unspecified: Secondary | ICD-10-CM

## 2023-12-28 MED ORDER — VALACYCLOVIR HCL 1 G PO TABS: ORAL_TABLET | ORAL | 0 refills | Status: DC

## 2023-12-28 NOTE — Telephone Encounter (Signed)
 Rx sent.

## 2023-12-28 NOTE — Telephone Encounter (Signed)
 FYI Only or Action Required?: Action required by provider: medication refill request.  Patient was last seen in primary care on 12/25/2023 by Frann Mabel Mt, DO.  Called Nurse Triage reporting Herpes Zoster (shingles).  Symptoms began today.  Interventions attempted: Nothing.  Symptoms are: gradually worsening.  Triage Disposition: See PCP Within 2 Weeks  Patient/caregiver understands and will follow disposition?: No, wishes to speak with PCP    Reason for Disposition  [1] Shingles rash already diagnosed AND [2] weak immune system (e.g., HIV positive, cancer chemotherapy, chronic steroid treatment, splenectomy) AND [3] taking antiviral medication  Answer Assessment - Initial Assessment Questions Patient reports she is having a shingles flare- patient states she has been ill recently and her immune system is having a hard time with this recent illness. Patient is requesting Rx- and asks if she needs maintenance treatment vs episodic?   1. APPEARANCE of RASH: What does the rash look like?      Has not checked- but feels itching and bumps 2. LOCATION: Where is the rash located?      Upper- center/shoulders- bumps on left/center 3. ONSET: When did the rash start?      Yesterday evening- burning in back 4. ITCHING: Does the rash itch? If Yes, ask: How bad is the itch?  (Scale 1-10; or mild, moderate, severe)     All the time- burning is more than itching 5. PAIN: Does the rash hurt? If Yes, ask: How bad is the pain?  (Scale 0-10; or none, mild, moderate, severe)     Burning sensation- feels painful- unable to wear bra 6. OTHER SYMPTOMS: Do you have any other symptoms? (e.g., fever)     Nausea  Protocols used: Shingles (Zoster)-A-AH   Copied from CRM [id number]. Topic: Clinical - Red Word Triage >> Dec 28, 2023 12:15 PM [place] J wrote: [facility] that prompted transfer to Nurse Triage: Patient noticed that her shingles are flaring back up and felt the burning on  her back.

## 2023-12-28 NOTE — Telephone Encounter (Signed)
 Copied from CRM #[id number]. Topic: Clinical - Medication Refill >> Dec 28, 2023 12:13 PM [place] J wrote: Medication: valACYclovir  (VALTREX ) 500 MG tablet  Has the patient contacted their pharmacy? No (Agent: If no, request that the patient contact the pharmacy for the refill. If patient does not wish to contact the pharmacy document the reason why and proceed with request.) (Agent: If yes, when and what did the pharmacy advise?)  This is the patient's preferred pharmacy:  [id number] [place] [place] - [address], [address] AT [facility] [address] [place] [address] Phone: [phone] Fax: [phone]  Is this the correct pharmacy for this prescription? Yes If no, delete pharmacy and type the correct one.   Has the prescription been filled recently? No  Is the patient out of the medication? Yes  Has the patient been seen for an appointment in the last year OR does the patient have an upcoming appointment? Yes  Can we respond through MyChart? Yes  Agent: Please be advised that Rx refills may take up to 3 business days. We ask that you follow-up with your pharmacy.

## 2023-12-28 NOTE — Telephone Encounter (Signed)
 Please advise? Pt thinks she is having a shingles outbreak, requesting a refill on Valtrex , she does have an appt on Monday 8/18

## 2023-12-31 ENCOUNTER — Telehealth: Payer: Self-pay

## 2023-12-31 ENCOUNTER — Ambulatory Visit: Admitting: Family Medicine

## 2023-12-31 NOTE — Telephone Encounter (Signed)
 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

## 2023-12-31 NOTE — Telephone Encounter (Signed)
 I'm thinking it's more because she's being treated for an infection rather than some constant deficiency. So hopefully she won't need more.

## 2023-12-31 NOTE — Telephone Encounter (Signed)
 Copied from CRM [id number]. Topic: General - Other >> Dec 28, 2023 12:11 PM [place] J wrote: Reason for CRM: Patient would also like to let Dr. Frann know that she feels more energized and has been able to sleep for the past 2 days now. She would also like to continue the weekly medication (cefdinir  (OMNICEF ) 300 MG) since she still has pressure on her lower pelvic area and thighs.

## 2023-12-31 NOTE — Telephone Encounter (Signed)
Called pt was advised stated understand

## 2024-01-02 DIAGNOSIS — M542 Cervicalgia: Secondary | ICD-10-CM | POA: Diagnosis not present

## 2024-01-02 DIAGNOSIS — R519 Headache, unspecified: Secondary | ICD-10-CM | POA: Diagnosis not present

## 2024-01-02 DIAGNOSIS — M545 Low back pain, unspecified: Secondary | ICD-10-CM | POA: Diagnosis not present

## 2024-01-04 ENCOUNTER — Other Ambulatory Visit: Payer: Self-pay

## 2024-01-04 ENCOUNTER — Telehealth: Payer: Self-pay

## 2024-01-04 MED ORDER — VALACYCLOVIR HCL 500 MG PO TABS: 500.0000 mg | ORAL_TABLET | Freq: Two times a day (BID) | ORAL | 0 refills | Status: DC

## 2024-01-04 NOTE — Telephone Encounter (Signed)
 Copied from CRM [id number]. Topic: Clinical - Prescription Issue >> Jan 04, 2024 11:52 AM Deleta RAMAN wrote: Reason for CRM: patient needs 500 mg for 90 days of Valtrex  instead of 1000 Mg tablet due to the price. [facility]  Called pt 500 mg sent.

## 2024-01-08 ENCOUNTER — Ambulatory Visit (INDEPENDENT_AMBULATORY_CARE_PROVIDER_SITE_OTHER): Admitting: Family Medicine

## 2024-01-08 ENCOUNTER — Encounter: Payer: Self-pay | Admitting: Family Medicine

## 2024-01-08 VITALS — BP 122/68 | HR 63 | Temp 98.0°F | Resp 16 | Ht 66.0 in

## 2024-01-08 DIAGNOSIS — R350 Frequency of micturition: Secondary | ICD-10-CM

## 2024-01-08 DIAGNOSIS — T50905A Adverse effect of unspecified drugs, medicaments and biological substances, initial encounter: Secondary | ICD-10-CM

## 2024-01-08 DIAGNOSIS — B009 Herpesviral infection, unspecified: Secondary | ICD-10-CM | POA: Diagnosis not present

## 2024-01-08 LAB — POC URINALSYSI DIPSTICK (AUTOMATED)
Bilirubin, UA: NEGATIVE
Blood, UA: NEGATIVE
Glucose, UA: NEGATIVE
Ketones, UA: NEGATIVE
Nitrite, UA: NEGATIVE
Protein, UA: NEGATIVE
Spec Grav, UA: 1.01 (ref 1.010–1.025)
Urobilinogen, UA: 0.2 U/dL
pH, UA: 6.5 (ref 5.0–8.0)

## 2024-01-08 MED ORDER — BETAMETHASONE VALERATE 0.1 % EX CREA: 1.0000 | TOPICAL_CREAM | Freq: Every day | CUTANEOUS | 3 refills | Status: AC

## 2024-01-08 MED ORDER — ESTROGENS CONJUGATED 0.625 MG/GM VA CREA: 1.0000 | TOPICAL_CREAM | Freq: Every day | VAGINAL | 12 refills | Status: AC

## 2024-01-08 MED ORDER — ACYCLOVIR 400 MG PO TABS: 400.0000 mg | ORAL_TABLET | Freq: Two times a day (BID) | ORAL | 1 refills | Status: AC

## 2024-01-08 MED ORDER — METHYLPREDNISOLONE ACETATE 80 MG/ML IJ SUSP
80.0000 mg | Freq: Once | INTRAMUSCULAR | Status: AC
  Administered 2024-01-08: 80 mg via INTRAMUSCULAR

## 2024-01-08 NOTE — Patient Instructions (Addendum)
 Stay hydrated.  We will be in touch with your urine culture over the next 2 days.   Let me know if there are cost issues with the new antiviral.     Let us  know if you need anything.

## 2024-01-08 NOTE — Progress Notes (Signed)
 Chief Complaint  Patient presents with   Allergic Reaction    Allergic Reaction     Jenna Campbell is a 73 y.o. female here for a skin complaint.  Duration: 5 days Location: back and left arm Pruritic? Yes Painful?  Having some lingering burning as well Drainage? No New soaps/lotions/topicals/detergents? Started after taking Omnicef  Trauma? No Other associated symptoms: red patch; no fevers Therapies tried thus far: benadryl  did help  Patient has a history of genital herpes.  She is taking Valtrex  500 mg twice daily.  This is worked well to prevent recurrent outbreaks.  Unfortunately her insurance no longer covers this medication and it is quite expensive.  She is wondering if there is anything else.  Past Medical History:  Diagnosis Date   Acute bronchitis due to other specified organisms 04/21/2015   Acute hyponatremia 11/19/2020   Acute tension-type headache 12/16/2015   Adjustment disorder with anxious mood 04/21/2015   Allergic asthma with acute exacerbation 09/22/2014   Allergic rhinitis due to pollen 07/23/2014   Allergic urticaria 05/13/2015   Allergy    Alopecia 10/30/2013   Angina pectoris (HCC) 07/29/2019   Arthritis of carpometacarpal (CMC) joint of left thumb 01/10/2022   Asthma    Atrophic vaginitis 10/30/2013   Basal cell carcinoma 04/21/2015   Bilateral impacted cerumen 04/21/2015   CAD (coronary artery disease) 10/28/2019   Cervical radiculopathy 09/17/2015   Change in bowel function 06/14/2016   Chest pain in adult 05/28/2017   Chest tightness 07/29/2019   Chronic diarrhea 12/16/2015   Colon polyps    Cough 02/14/2016   COVID-19 virus infection 11/19/2020   Cystocele, midline 10/30/2013   Dermatitis 09/22/2014   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   Diverticulosis of large intestine 10/30/2013   Drug-induced myopathy 09/12/2022   Dupuytren's contracture of both hands 09/17/2019   Dyspareunia 07/23/2014   Dysuria 12/16/2015   Encounter for  long-term (current) use of other medications 10/19/2012   Esophageal dysphagia 06/14/2016   Essential hypertension    Family history of pheochromocytoma 02/14/2016   Foot pain, bilateral 01/05/2018   Gastroesophageal reflux disease    Gastroesophageal reflux disease without esophagitis 05/13/2015   Generalized abdominal pain 12/16/2015   Generalized anxiety disorder 10/21/2021   GERD (gastroesophageal reflux disease)    H/O: hysterectomy    Herpes simplex 08/17/2016   History of adenomatous polyp of colon 06/14/2016   History of blood transfusion 1970   Hx of migraines 05/15/2009   Hyperlipemia 12/10/2020   Hypothyroidism 10/30/2013   Hypotonic bladder 10/30/2013   Insomnia 10/11/2017   Localized edema 12/16/2015   Low back pain 09/17/2015   Lumbar paraspinal muscle spasm 04/21/2015   Major depression, recurrent (HCC) 07/14/2013   Major depressive disorder, recurrent, mild (HCC) 03/14/2017   Malaise and fatigue 04/21/2015   MCI (mild cognitive impairment) 08/18/2015   Memory difficulty 09/17/2015   Menopause 03/31/2014   Midline low back pain without sciatica 03/31/2014   Migraine    Mild intermittent extrinsic asthma 12/16/2015   Mild persistent asthma 05/13/2015   Mixed dyslipidemia 11/12/2019   Nausea vomiting and diarrhea 11/19/2020   Neck pain 09/17/2015   Neuropathy 09/22/2014   Neutropenia, unspecified type (HCC) 07/07/2021   Nonintractable headache 11/02/2016   Osteoporosis    Other allergic rhinitis 05/13/2015   Palpitation 09/03/2017   Pedal edema 07/29/2019   Pelvic pain in female 02/17/2015   Personality disorder (HCC) 10/19/2012   Pharyngoesophageal dysphagia 04/21/2015   Plantar fasciitis 01/20/2016   Precordial chest pain 08/18/2015  Primary insomnia 10/11/2017   Prolapse of vaginal vault after hysterectomy 10/30/2013   Rectocele 10/30/2013   Seasonal allergies 10/15/2019   Senile purpura (HCC) 06/15/2020   Shin splint 04/21/2015   Shortness of  breath 07/17/2016   Sinusitis, chronic 10/30/2013   Slow transit constipation 07/23/2014   SOB (shortness of breath) 11/19/2020   Stable angina (HCC) 10/28/2019   Swelling of lower limb 05/17/2016   Tension-type headache, not intractable 09/17/2015   Thyroid  disease    Tinnitus of both ears 10/11/2017   Unstable angina (HCC) 07/29/2019   Urethral stricture 10/30/2013   Urinary frequency 02/23/2021   Urinary incontinence    Urinary incontinence without sensory awareness 07/17/2016   Varicose veins of both lower extremities 04/24/2016   Venous insufficiency (chronic) (peripheral) 06/06/2016    BP 122/68 (BP Location: Left Arm, Patient Position: Sitting)   Pulse 63   Temp 98 F (36.7 C) (Oral)   Resp 16   Ht 5' 6 (1.676 m)   SpO2 100%   BMI 20.95 kg/m  Gen: awake, alert, appearing stated age Heart: RRR, no lower extremity edema Lungs: CTAB.  No accessory muscle use Skin: No obvious lesions though she shows a picture with pinkish patches on her back and left upper extremity. No drainage, erythema, TTP, fluctuance, excoriation Psych: Age appropriate judgment and insight  Adverse effect of drug, initial encounter - Plan: methylPREDNISolone  acetate (DEPO-MEDROL ) injection 80 mg  Herpes simplex  Urine frequency - Plan: POCT Urinalysis Dipstick (Automated), Urine Culture  Adverse effect of medication.  Depo-Medrol  injection today to help resolve symptoms.  Consider daily antihistamine.  Cephalosporins added to her allergy list. Valacyclovir  is very expensive.  We will change it to acyclovir  400 mg twice daily for suppressive care.  She will let me know if there are any cost issues moving forward. Recheck urine.  Initial UA unremarkable.  Hopefully our treatment helped not grow any infection if she is doing better clinically. F/u prn. The patient voiced understanding and agreement to the plan.  Mabel Mt Hamel, DO 01/08/24 4:29 PM

## 2024-01-09 LAB — URINE CULTURE
MICRO NUMBER:: 16884464
Result:: NO GROWTH
SPECIMEN QUALITY:: ADEQUATE

## 2024-01-10 ENCOUNTER — Ambulatory Visit: Payer: Self-pay | Admitting: Family Medicine

## 2024-01-10 ENCOUNTER — Encounter: Payer: Self-pay | Admitting: Family Medicine

## 2024-01-11 NOTE — Telephone Encounter (Signed)
Could you assist?

## 2024-01-15 ENCOUNTER — Encounter: Payer: Self-pay | Admitting: Pharmacist

## 2024-01-15 NOTE — Progress Notes (Signed)
 01/15/2024 Name: Jenna Campbell MRN: [id number] DOB: 02/18/1951  Chief Complaint  Patient presents with   Medication Management    LUCREZIA DEHNE is a 73 y.o. year old female who presented for a telephone visit.   They were referred to the pharmacist by their PCP for assistance in managing medication access.    Subjective: Received forwarded message regarding cost of recently prescribed medications - Premarin  vaginal cream - cost was $249; valacyclovir  - tier 3 - cost was $47 / month and betamethasone  valerate cream - tier 3 - cost was $47 / month.   Valacyclovir  was changed to acyclovir  and cost decreased to $5. Patient is tolerating acyclovir  well so far.  Medication Access/Adherence  Current Pharmacy:  [facility] [place] - [phone] [place] [address] Phone: [phone] Fax: [phone]  [phone] [place] [place] - [address], [address] AT [facility] [address] [place] [address] Phone: [phone] Fax: [phone]  [id number] [place] [place] - [address] [place] [address] Phone: [phone] Fax: [phone]   Patient reports affordability concerns with their medications: Yes  Patient reports access/transportation concerns to their pharmacy: No  Patient reports adherence concerns with their medications:  No     Objective:  No results found for: HGBA1C  Lab Results  Component Value Date   CREATININE 0.68 12/05/2023   BUN 13 12/05/2023   NA 132 (L) 12/05/2023   K 3.5 12/05/2023   CL 99 12/05/2023   CO2 27 12/05/2023    Lab Results  Component Value Date   CHOL 173 09/12/2022   HDL 39.60 09/12/2022   LDLCALC 102 (H) 09/12/2022   TRIG 157.0 (H) 09/12/2022   CHOLHDL 4 09/12/2022   Current Outpatient Medications  Medication Instructions   acetaminophen  (TYLENOL ) 500 mg, Every 6 hours PRN    acyclovir  (ZOVIRAX ) 400 mg, Oral, 2 times daily   albuterol  (VENTOLIN  HFA) 108 (90 Base) MCG/ACT inhaler 2 puffs, Inhalation, Every 4 hours PRN   alendronate  (FOSAMAX ) 70 mg, Oral, Every 7 days, Take with a full glass of water on an empty stomach.   ASPIRIN  81 PO 81 mg, Daily   betamethasone  valerate (VALISONE ) 0.1 % cream 1 Application, Topical, Daily   Calcium -Phosphorus-Vitamin D  (CALCIUM  GUMMIES PO) 1 Piece of gum, Daily   cholecalciferol  (VITAMIN D3) 1,000 Units, Daily   Co Q-10 200 mg, Oral, Daily   conjugated estrogens  (PREMARIN ) vaginal cream 1 Applicatorful, Vaginal, Daily   DULoxetine  (CYMBALTA ) 20 mg, Oral, Daily   ezetimibe  (ZETIA ) 10 mg, Oral, Daily   fluconazole  (DIFLUCAN ) 150 MG tablet Take 1 tab, repeat in 72 hours if no improvement.   fluticasone  (FLONASE ) 50 MCG/ACT nasal spray 2 sprays, Each Nare, As needed   fluticasone -salmeterol (ADVAIR DISKUS) 250-50 MCG/ACT AEPB TAKE 1 PUFF BY MOUTH TWICE A DAY   furosemide  (LASIX ) 40 mg, Oral, Daily   gabapentin  (NEURONTIN ) 300 mg, Every morning   ipratropium-albuterol  (DUONEB) 0.5-2.5 (3) MG/3ML SOLN 3 mLs, Nebulization, Every 4 hours PRN   isosorbide  mononitrate (IMDUR ) 15 mg, Oral, Daily, Please split the tablet in half and take 1/2 tablet once daily in AM.   levothyroxine  (SYNTHROID ) 225 mcg, Oral, Daily before breakfast   LORazepam  (ATIVAN ) 0.5 mg, Every 8 hours PRN   Multiple Vitamins-Minerals (PRESERVISION AREDS 2 PO) 1 tablet, Daily   olopatadine  (  PATANOL) 0.1 % ophthalmic solution 1 drop, Both Eyes, 2 times daily   OVER THE COUNTER MEDICATION 2 tablets, Daily PRN   pantoprazole  (PROTONIX ) 40 mg, Oral, 2 times daily   potassium chloride  (KLOR-CON ) 10 MEQ tablet TAKE 2 TABLETS WITH EVERY DOSE OF LASIX  40 MG.   temazepam  (RESTORIL ) 15 mg, Daily at bedtime   topiramate  (TOPAMAX ) 100-200 mg, Daily at bedtime        Assessment/Plan:   Medication Management / Access:  - Reviewed her 2025 [employer] formulary.    Estradiol  is tier 2 with cost of $5 per month and is an alternative to Premarin  Vaginal Cream.   Tier 2 alternatives to betamethasone  valerate:    Hydrocortisone  2.5mg  cream / ointment - but it is lower potency than betamethasone  valerate   Clobetasol  propionate cream / ointment 0.05% - this is very high potency steroid cream.    Ointments are sometimes preferred to creams to use vaginally because ointments generally have fewer additives / potential allergens.     Madelin Ray, PharmD Clinical Pharmacist [facility]  Population Health [phone]

## 2024-01-16 ENCOUNTER — Other Ambulatory Visit: Payer: Self-pay

## 2024-01-16 ENCOUNTER — Other Ambulatory Visit: Payer: Self-pay | Admitting: Family Medicine

## 2024-01-16 DIAGNOSIS — M545 Low back pain, unspecified: Secondary | ICD-10-CM

## 2024-01-16 MED ORDER — ESTRADIOL 1 MG/GM TD GEL: TRANSDERMAL | 1 refills | Status: DC

## 2024-02-10 ENCOUNTER — Other Ambulatory Visit: Payer: Self-pay | Admitting: Family Medicine

## 2024-02-11 ENCOUNTER — Other Ambulatory Visit: Payer: Self-pay | Admitting: Family Medicine

## 2024-02-27 ENCOUNTER — Other Ambulatory Visit: Payer: Self-pay | Admitting: Cardiology

## 2024-02-29 ENCOUNTER — Ambulatory Visit

## 2024-02-29 ENCOUNTER — Ambulatory Visit: Payer: Self-pay | Admitting: Family Medicine

## 2024-02-29 ENCOUNTER — Ambulatory Visit
Admission: RE | Admit: 2024-02-29 | Discharge: 2024-02-29 | Disposition: A | Discharge status: Home / Self Care | Attending: Family Medicine | Admitting: Family Medicine

## 2024-02-29 VITALS — BP 153/71 | HR 62 | Temp 98.0°F | Resp 19

## 2024-02-29 DIAGNOSIS — R300 Dysuria: Secondary | ICD-10-CM | POA: Diagnosis not present

## 2024-02-29 DIAGNOSIS — R519 Headache, unspecified: Secondary | ICD-10-CM | POA: Diagnosis not present

## 2024-02-29 DIAGNOSIS — R059 Cough, unspecified: Secondary | ICD-10-CM

## 2024-02-29 DIAGNOSIS — N3000 Acute cystitis without hematuria: Secondary | ICD-10-CM | POA: Diagnosis not present

## 2024-02-29 DIAGNOSIS — J3489 Other specified disorders of nose and nasal sinuses: Secondary | ICD-10-CM | POA: Diagnosis not present

## 2024-02-29 DIAGNOSIS — J069 Acute upper respiratory infection, unspecified: Secondary | ICD-10-CM

## 2024-02-29 LAB — POCT URINALYSIS DIP (MANUAL ENTRY)
Bilirubin, UA: NEGATIVE
Blood, UA: NEGATIVE
Glucose, UA: NEGATIVE mg/dL
Ketones, POC UA: NEGATIVE mg/dL
Nitrite, UA: NEGATIVE
Protein Ur, POC: NEGATIVE mg/dL
Spec Grav, UA: 1.015 (ref 1.010–1.025)
Urobilinogen, UA: 0.2 U/dL
pH, UA: 6 (ref 5.0–8.0)

## 2024-02-29 MED ORDER — HYDROCOD POLI-CHLORPHE POLI ER 10-8 MG/5ML PO SUER: 5.0000 mL | Freq: Two times a day (BID) | ORAL | 0 refills | Status: AC | PRN

## 2024-02-29 MED ORDER — PREDNISONE 50 MG PO TABS: ORAL_TABLET | ORAL | 0 refills | Status: DC

## 2024-02-29 MED ORDER — DOXYCYCLINE HYCLATE 100 MG PO CAPS: 100.0000 mg | ORAL_CAPSULE | Freq: Two times a day (BID) | ORAL | 0 refills | Status: AC

## 2024-02-29 NOTE — Telephone Encounter (Signed)
 FYI Only or Action Required?: FYI only for provider.  Patient was last seen in primary care on 01/08/2024 by Frann Mabel Mt, DO.  Called Nurse Triage reporting Cough.  Symptoms began several days ago.  Interventions attempted: OTC medications: Allergy medications.  Symptoms are: gradually worsening.  Triage Disposition: See HCP Within 4 Hours (Or PCP Triage)  Patient/caregiver understands and will follow disposition?: Yes          Copied from CRM [id number]. Topic: Clinical - Red Word Triage >> Feb 29, 2024  8:37 AM Deaijah H wrote: Red Word that prompted transfer to Nurse Triage: Itchy dry cough getting worse. Mucus(no color), sore throat, sinus drainage OTC not working. Reason for Disposition  [1] MILD difficulty breathing (e.g., minimal/no SOB at rest, SOB with walking, pulse < 100) AND [2] still present when not coughing  Answer Assessment - Initial Assessment Questions This RN made patient an appointment at urgent care this morning as no appointment availability in office.   Dry cough started early this week Pt thought it was allergies and has been taking OTC allergy medications A lot of mucous in sinuses; feels like in ears; clear in color A little bit of blood in nose today Sore throat Denies fever, nausea Pt states she has asthma and her breathing is worsening  Protocols used: Cough - Acute Non-Productive-A-AH

## 2024-02-29 NOTE — ED Provider Notes (Signed)
 [facility]    CSN: [id number] Arrival date & time: 02/29/24  1019      History   Chief Complaint Chief Complaint  Patient presents with   Cough    Entered by patient    HPI Jenna Campbell is a 73 y.o. female.   HPI pleasant 73 year old female presents with sinus pressure, pain, headache, sore throat, cough, bilateral ear pain, and congestion for 1 week.  Patient reports she is using her inhaler and also having difficulty sleeping due to cough.  PMH significant for acute bronchitis, adjustment disorder with anxious mood, BCC, and CAD  Past Medical History:  Diagnosis Date   Acute bronchitis due to other specified organisms 04/21/2015   Acute hyponatremia 11/19/2020   Acute tension-type headache 12/16/2015   Adjustment disorder with anxious mood 04/21/2015   Allergic asthma with acute exacerbation 09/22/2014   Allergic rhinitis due to pollen 07/23/2014   Allergic urticaria 05/13/2015   Allergy    Alopecia 10/30/2013   Angina pectoris 07/29/2019   Arthritis of carpometacarpal [facility]) joint of left thumb 01/10/2022   Asthma    Atrophic vaginitis 10/30/2013   Basal cell carcinoma 04/21/2015   Bilateral impacted cerumen 04/21/2015   CAD (coronary artery disease) 10/28/2019   Cervical radiculopathy 09/17/2015   Change in bowel function 06/14/2016   Chest pain in adult 05/28/2017   Chest tightness 07/29/2019   Chronic diarrhea 12/16/2015   Colon polyps    Cough 02/14/2016   COVID-19 virus infection 11/19/2020   Cystocele, midline 10/30/2013   Dermatitis 09/22/2014   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   Diverticulosis of large intestine 10/30/2013   Drug-induced myopathy 09/12/2022   Dupuytren's contracture of both hands 09/17/2019   Dyspareunia 07/23/2014   Dysuria 12/16/2015   Encounter for long-term (current) use of other medications 10/19/2012   Esophageal dysphagia 06/14/2016   Essential hypertension    Family history of pheochromocytoma 02/14/2016    Foot pain, bilateral 01/05/2018   Gastroesophageal reflux disease    Gastroesophageal reflux disease without esophagitis 05/13/2015   Generalized abdominal pain 12/16/2015   Generalized anxiety disorder 10/21/2021   GERD (gastroesophageal reflux disease)    H/O: hysterectomy    Herpes simplex 08/17/2016   History of adenomatous polyp of colon 06/14/2016   History of blood transfusion 1970   Hx of migraines 05/15/2009   Hyperlipemia 12/10/2020   Hypothyroidism 10/30/2013   Hypotonic bladder 10/30/2013   Insomnia 10/11/2017   Localized edema 12/16/2015   Low back pain 09/17/2015   Lumbar paraspinal muscle spasm 04/21/2015   Major depression, recurrent 07/14/2013   Major depressive disorder, recurrent, mild 03/14/2017   Malaise and fatigue 04/21/2015   MCI (mild cognitive impairment) 08/18/2015   Memory difficulty 09/17/2015   Menopause 03/31/2014   Midline low back pain without sciatica 03/31/2014   Migraine    Mild intermittent extrinsic asthma 12/16/2015   Mild persistent asthma 05/13/2015   Mixed dyslipidemia 11/12/2019   Nausea vomiting and diarrhea 11/19/2020   Neck pain 09/17/2015   Neuropathy 09/22/2014   Neutropenia, unspecified type 07/07/2021   Nonintractable headache 11/02/2016   Osteoporosis    Other allergic rhinitis 05/13/2015   Palpitation 09/03/2017   Pedal edema 07/29/2019   Pelvic pain in female 02/17/2015   Personality disorder (HCC) 10/19/2012   Pharyngoesophageal dysphagia 04/21/2015   Plantar fasciitis 01/20/2016   Precordial chest pain 08/18/2015   Primary insomnia 10/11/2017   Prolapse of vaginal vault after hysterectomy 10/30/2013   Rectocele 10/30/2013   Seasonal allergies 10/15/2019  Senile purpura 06/15/2020   Shin splint 04/21/2015   Shortness of breath 07/17/2016   Sinusitis, chronic 10/30/2013   Slow transit constipation 07/23/2014   SOB (shortness of breath) 11/19/2020   Stable angina 10/28/2019   Swelling of lower limb 05/17/2016    Tension-type headache, not intractable 09/17/2015   Thyroid  disease    Tinnitus of both ears 10/11/2017   Unstable angina (HCC) 07/29/2019   Urethral stricture 10/30/2013   Urinary frequency 02/23/2021   Urinary incontinence    Urinary incontinence without sensory awareness 07/17/2016   Varicose veins of both lower extremities 04/24/2016   Venous insufficiency (chronic) (peripheral) 06/06/2016    Patient Active Problem List   Diagnosis Date Noted   Osteoporosis    Drug-induced myopathy 09/12/2022   Arthritis of carpometacarpal (CMC) joint of left thumb 01/10/2022   Generalized anxiety disorder 10/21/2021   Neutropenia, unspecified type 07/07/2021   Urinary frequency 02/23/2021   Hyperlipemia 12/10/2020   Nausea vomiting and diarrhea 11/19/2020   COVID-19 virus infection 11/19/2020   SOB (shortness of breath) 11/19/2020   Acute hyponatremia 11/19/2020   Senile purpura 06/15/2020   GERD (gastroesophageal reflux disease)    Mixed dyslipidemia 11/12/2019   CAD (coronary artery disease) 10/28/2019   Stable angina 10/28/2019   Seasonal allergies 10/15/2019   Dupuytren's contracture of both hands 09/17/2019   Chest tightness 07/29/2019   Unstable angina (HCC) 07/29/2019   Pedal edema 07/29/2019   Angina pectoris 07/29/2019   Foot pain, bilateral 01/05/2018   Tinnitus of both ears 10/11/2017   Primary insomnia 10/11/2017   Insomnia 10/11/2017   Palpitation 09/03/2017   Chest pain in adult 05/28/2017   Major depressive disorder, recurrent, mild 03/14/2017   Allergy    Asthma    Colon polyps    Essential hypertension    Gastroesophageal reflux disease    H/O: hysterectomy    Thyroid  disease    Urinary incontinence    Nonintractable headache 11/02/2016   Herpes simplex 08/17/2016   Shortness of breath 07/17/2016   Urinary incontinence without sensory awareness 07/17/2016   Change in bowel function 06/14/2016   Esophageal dysphagia 06/14/2016   History of adenomatous  polyp of colon 06/14/2016   Venous insufficiency (chronic) (peripheral) 06/06/2016   Swelling of lower limb 05/17/2016   Varicose veins of both lower extremities 04/24/2016   Cough 02/14/2016   Family history of pheochromocytoma 02/14/2016   Plantar fasciitis 01/20/2016   Acute tension-type headache 12/16/2015   Chronic diarrhea 12/16/2015   Dysuria 12/16/2015   Generalized abdominal pain 12/16/2015   Localized edema 12/16/2015   Mild intermittent extrinsic asthma 12/16/2015   Cervical radiculopathy 09/17/2015   Neck pain 09/17/2015   Low back pain 09/17/2015   Memory difficulty 09/17/2015   Tension-type headache, not intractable 09/17/2015   MCI (mild cognitive impairment) 08/18/2015   Precordial chest pain 08/18/2015   Mild persistent asthma 05/13/2015   Other allergic rhinitis 05/13/2015   Gastroesophageal reflux disease without esophagitis 05/13/2015   Allergic urticaria 05/13/2015   Acute bronchitis due to other specified organisms 04/21/2015   Adjustment disorder with anxious mood 04/21/2015   Basal cell carcinoma 04/21/2015   Bilateral impacted cerumen 04/21/2015   Lumbar paraspinal muscle spasm 04/21/2015   Malaise and fatigue 04/21/2015   Pharyngoesophageal dysphagia 04/21/2015   Shin splint 04/21/2015   Pelvic pain in female 02/17/2015   Allergic asthma with acute exacerbation 09/22/2014   Dermatitis 09/22/2014   Neuropathy 09/22/2014   Allergic rhinitis due to pollen 07/23/2014  Dyspareunia 07/23/2014   Slow transit constipation 07/23/2014   Menopause 03/31/2014   Midline low back pain without sciatica 03/31/2014   Alopecia 10/30/2013   Cystocele, midline 10/30/2013   Diverticulosis of large intestine 10/30/2013   Hypothyroidism 10/30/2013   Hypotonic bladder 10/30/2013   Atrophic vaginitis 10/30/2013   Prolapse of vaginal vault after hysterectomy 10/30/2013   Rectocele 10/30/2013   Sinusitis, chronic 10/30/2013   Urethral stricture 10/30/2013   Major  depression, recurrent 07/14/2013   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   Encounter for long-term (current) use of other medications 10/19/2012   Migraine 10/19/2012   Personality disorder (HCC) 10/19/2012   Hx of migraines 05/15/2009   History of blood transfusion 05/15/1968    Past Surgical History:  Procedure Laterality Date   BREAST CYST ASPIRATION Left    25 years ago    COLONOSCOPY  04/2016   [facility] Diverticulitis and Multiple colon polyps   CORONARY PRESSURE/FFR STUDY N/A 10/17/2019   Procedure: INTRAVASCULAR PRESSURE WIRE/FFR STUDY;  Surgeon: Mady Bruckner, MD;  Location: [facility];  Service: Cardiovascular;  Laterality: N/A;   ESOPHAGOGASTRODUODENOSCOPY  04/2016   [facility]   LEFT HEART CATH AND CORONARY ANGIOGRAPHY N/A 10/17/2019   Procedure: LEFT HEART CATH AND CORONARY ANGIOGRAPHY;  Surgeon: Mady Bruckner, MD;  Location: [facility];  Service: Cardiovascular;  Laterality: N/A;   MOHS SURGERY  10/05/2015   RECTOCELE REPAIR  2011   SKIN CANCER EXCISION  02/2015   Squamous cell removed from back   TONSILLECTOMY     VAGINAL HYSTERECTOMY     VAGINAL PROLAPSE REPAIR      OB History   No obstetric history on file.      Home Medications    Prior to Admission medications   Medication Sig Start Date End Date Taking? Authorizing Provider  chlorpheniramine-HYDROcodone  (TUSSIONEX) 10-8 MG/5ML Take 5 mLs by mouth every 12 (twelve) hours as needed for cough. 02/29/24  Yes Teddy Sharper, FNP  doxycycline  (VIBRAMYCIN ) 100 MG capsule Take 1 capsule (100 mg total) by mouth 2 (two) times daily for 7 days. 02/29/24 03/07/24 Yes Teddy Sharper, FNP  predniSONE  (DELTASONE ) 50 MG tablet Take 1 tab p.o. daily for 5 days. 02/29/24  Yes Teddy Sharper, FNP  acetaminophen  (TYLENOL ) 500 MG tablet Take 500 mg by mouth every 6 (six) hours as needed for moderate pain.    [provider]  acyclovir  (ZOVIRAX ) 400 MG tablet Take 1 tablet (400 mg total) by mouth  2 (two) times daily. 01/08/24   Frann Mabel Mt, DO  albuterol  (VENTOLIN  HFA) 108 [phone] Base) MCG/ACT inhaler Inhale 2 puffs into the lungs every 4 (four) hours as needed for wheezing or shortness of breath. 01/05/23   Wendling, Mabel Mt, DO  alendronate  (FOSAMAX ) 70 MG tablet Take 1 tablet (70 mg total) by mouth every 7 (seven) days. Take with a full glass of water on an empty stomach. 12/06/23   Wendling, Mabel Mt, DO  ASPIRIN  81 PO Take 81 mg by mouth daily.     [provider]  betamethasone  valerate (VALISONE ) 0.1 % cream Apply 1 Application topically daily. 01/08/24   Frann Mabel Mt, DO  Calcium -Phosphorus-Vitamin D  (CALCIUM  GUMMIES PO) Take 1 Piece of gum by mouth daily.    [provider]  cholecalciferol  (VITAMIN D3) 25 MCG (1000 UNIT) tablet Take 1,000 Units by mouth daily.    [provider]  Coenzyme Q10 (CO Q-10) 200 MG CAPS Take 200 mg by mouth daily.  10/13/20   Revankar, Rajan R, MD  conjugated estrogens  (PREMARIN ) vaginal cream Place 1 Applicatorful vaginally daily. 01/08/24   Frann Mabel Mt, DO  DULoxetine  (CYMBALTA ) 20 MG capsule Take 1 capsule (20 mg total) by mouth daily. 01/16/24   Frann Mabel Mt, DO  Estradiol  1 MG/GM GEL USE DAILY FOR 10 DAYS AND THEN 3 TIMES WEEKLY. 02/11/24   Frann Mabel Mt, DO  ezetimibe  (ZETIA ) 10 MG tablet Take 1 tablet (10 mg total) by mouth daily. 02/28/24   Monetta Redell PARAS, MD  fluconazole  (DIFLUCAN ) 150 MG tablet Take 1 tab, repeat in 72 hours if no improvement. 12/25/23   Frann Mabel Mt, DO  fluticasone  (FLONASE ) 50 MCG/ACT nasal spray Place 2 sprays into both nostrils as needed for rhinitis or allergies. 09/04/22   Almarie Waddell NOVAK, NP  fluticasone -salmeterol (ADVAIR DISKUS) 250-50 MCG/ACT AEPB TAKE 1 PUFF BY MOUTH TWICE A DAY 06/28/23   Antonio Meth, Yvonne R, DO  furosemide  (LASIX ) 40 MG tablet Take 1 tablet (40 mg total) by mouth daily. 05/10/22   Frann Mabel Mt, DO   gabapentin  (NEURONTIN ) 300 MG capsule Take 300 mg by mouth in the morning.    [provider]  ipratropium-albuterol  (DUONEB) 0.5-2.5 (3) MG/3ML SOLN Take 3 mLs by nebulization every 4 (four) hours as needed. 09/04/22   Almarie Waddell NOVAK, NP  isosorbide  mononitrate (IMDUR ) 30 MG 24 hr tablet Take 0.5 tablets (15 mg total) by mouth daily. Please split the tablet in half and take 1/2 tablet once daily in AM. 03/21/23   Monetta Redell PARAS, MD  levothyroxine  (SYNTHROID ) 150 MCG tablet Take 1.5 tablets (225 mcg total) by mouth daily before breakfast. 09/25/23   Wendling, Mabel Mt, DO  LORazepam  (ATIVAN ) 0.5 MG tablet Take 0.5 mg by mouth every 8 (eight) hours as needed for anxiety.     [provider]  Multiple Vitamins-Minerals (PRESERVISION AREDS 2 PO) Take 1 tablet by mouth daily.    [provider]  olopatadine  (PATANOL) 0.1 % ophthalmic solution Place 1 drop into both eyes 2 (two) times daily. 09/04/22   Almarie Waddell NOVAK, NP  OVER THE COUNTER MEDICATION Take 2 tablets by mouth daily as needed (to prevent UTIs). Verlon Three times a day. No longer than three days    [provider]  pantoprazole  (PROTONIX ) 40 MG tablet TAKE 1 TABLET BY MOUTH TWICE A DAY 12/14/23   Frann Mabel Mt, DO  potassium chloride  (KLOR-CON ) 10 MEQ tablet TAKE 2 TABLETS WITH EVERY DOSE OF LASIX  40 MG. 05/25/23   Wendling, Mabel Mt, DO  temazepam  (RESTORIL ) 15 MG capsule Take 15 mg by mouth at bedtime.  07/28/19   [provider]  topiramate  (TOPAMAX ) 100 MG tablet Take 100-200 mg by mouth at bedtime.    [provider]    Family History Family History  Problem Relation Age of Onset   Asthma Father    Angina Father    Emphysema Father    Alzheimer's disease Mother    Cancer Sister        died of cancer   Colon cancer Neg Hx    Esophageal cancer Neg Hx     Social History Social History   Tobacco Use   Smoking status: Never   Smokeless tobacco: Never  Vaping  Use   Vaping status: Never Used  Substance Use Topics   Alcohol use: Not Currently   Drug use: No     Allergies   Iodinated contrast media, Kenalog  [triamcinolone  acetonide],  Metrizamide, Other, Penicillins, Lipitor [atorvastatin ], Montelukast  sodium, Pitavastatin , Rosuvastatin , Xyzal  [levocetirizine], Cephalosporins, Ciprofloxacin, Diazepam, Latex, and Sulfa antibiotics   Review of Systems Review of Systems  HENT:  Positive for congestion, ear pain, sinus pressure, sinus pain and sore throat.   Respiratory:  Positive for cough.   Neurological:  Positive for headaches.  All other systems reviewed and are negative.    Physical Exam Triage Vital Signs ED Triage Vitals  Encounter Vitals Group     BP 02/29/24 1106 (!) 153/71     Girls Systolic BP Percentile --      Girls Diastolic BP Percentile --      Boys Systolic BP Percentile --      Boys Diastolic BP Percentile --      Pulse Rate 02/29/24 1106 62     Resp 02/29/24 1106 19     Temp 02/29/24 1106 98 F (36.7 C)     Temp src --      SpO2 02/29/24 1106 98 %     Weight --      Height --      Head Circumference --      Peak Flow --      Pain Score 02/29/24 1105 0     Pain Loc --      Pain Education --      Exclude from Growth Chart --    No data found.  Updated Vital Signs BP (!) 153/71   Pulse 62   Temp 98 F (36.7 C)   Resp 19   SpO2 98%   Visual Acuity Right Eye Distance:   Left Eye Distance:   Bilateral Distance:    Right Eye Near:   Left Eye Near:    Bilateral Near:     Physical Exam Vitals and nursing note reviewed.  Constitutional:      Appearance: Normal appearance. She is normal weight. She is ill-appearing.  HENT:     Head: Normocephalic and atraumatic.     Right Ear: Tympanic membrane and external ear normal.     Left Ear: Tympanic membrane and external ear normal.     Ears:     Comments: Moderate eustachian tube dysfunction noted bilaterally    Mouth/Throat:     Mouth: Mucous membranes  are moist.     Pharynx: Oropharynx is clear.  Eyes:     Extraocular Movements: Extraocular movements intact.     Conjunctiva/sclera: Conjunctivae normal.     Pupils: Pupils are equal, round, and reactive to light.  Cardiovascular:     Rate and Rhythm: Normal rate and regular rhythm.     Heart sounds: Normal heart sounds.  Pulmonary:     Effort: Pulmonary effort is normal.     Breath sounds: No wheezing, rhonchi or rales.     Comments: Diminished breath sounds noted throughout Musculoskeletal:        General: Normal range of motion.  Skin:    General: Skin is warm and dry.  Neurological:     General: No focal deficit present.     Mental Status: She is alert and oriented to person, place, and time. Mental status is at baseline.  Psychiatric:        Mood and Affect: Mood normal.        Behavior: Behavior normal.      UC Treatments / Results  Labs (all labs ordered are listed, but only abnormal results are displayed) Labs Reviewed  POCT URINALYSIS DIP (MANUAL ENTRY) - Abnormal; Notable for  the following components:      Result Value   Leukocytes, UA Small (1+) (*)    All other components within normal limits  URINE CULTURE    EKG   Radiology DG Chest 2 View Result Date: 02/29/2024 EXAM: 2 VIEW(S) XRAY OF THE CHEST 02/29/2024 12:28:00 PM COMPARISON: 06/01/2023 CLINICAL HISTORY: Cough x 1 week diminished breath sounds on exam. Pt presents to uc with co sinus pressure and pain, headache, sore throat, cough, otalgia and congestion for one week. FINDINGS: LUNGS AND PLEURA: No focal pulmonary opacity. No pulmonary edema. No pleural effusion. No pneumothorax. HEART AND MEDIASTINUM: No acute abnormality of the cardiac and mediastinal silhouettes. BONES AND SOFT TISSUES: No acute osseous abnormality. IMPRESSION: 1. No acute cardiopulmonary disease. Electronically signed by: Lynwood Seip MD 02/29/2024 01:20 PM EDT RP Workstation: [id number]    Procedures Procedures (including  critical care time)  Medications Ordered in UC Medications - No data to display  Initial Impression / Assessment and Plan / UC Course  I have reviewed the triage vital signs and the nursing notes.  Pertinent labs & imaging results that were available during my care of the patient were reviewed by me and considered in my medical decision making (see chart for details).     MDM: 1.  Acute URI-Rx'd doxycycline  100 mg capsule: Take 1 capsule twice daily x 7 days; 2.  Cough, unspecified type-CXR results revealed above, patient advised, Rx'd prednisone  50 mg tablet, take 1 tablet daily x 5 days, Rx'd Tussionex 10-8 MG/5 mL syrup: Take 5 mL every 12 hours, as needed for cough.;  3.  Acute cystitis without hematuria-UA revealed above, urine culture ordered.  Rx'd doxycycline  100 mg capsule: Take 1 capsule twice daily x 7 days. Advised patient to take medications as directed with food to completion.  Advised patient to take prednisone  with first dose of doxycycline  for the next 5 of 7 days.  Advised may take Tussionex cough syrup for cough prior to sleep due to sedative  effects.  Encouraged increase in daily water intake to 32 ounces per day while taking these medications.  Advised we will follow-up with urine culture results once received.  Advised if symptoms worsen and/or unresolved please follow-up with your PCP or here for further evaluation.  Discharged home, hemodynamically stable. Final Clinical Impressions(s) / UC Diagnoses   Final diagnoses:  Cough, unspecified type  Dysuria  Acute URI  Acute cystitis without hematuria     Discharge Instructions      Advised patient to take medications as directed with food to completion.  Advised patient to take prednisone  with first dose of doxycycline  for the next 5 of 7 days.  Advised may take Tussionex cough syrup for cough prior to sleep due to sedative  effects.  Encouraged increase in daily water intake to 32 ounces per day while taking these  medications.  Advised we will follow-up with urine culture results once received.  Advised if symptoms worsen and/or unresolved please follow-up with your PCP or here for further evaluation.     ED Prescriptions     Medication Sig Dispense Auth. Provider   doxycycline  (VIBRAMYCIN ) 100 MG capsule Take 1 capsule (100 mg total) by mouth 2 (two) times daily for 7 days. 14 capsule Tylyn Derwin, FNP   predniSONE  (DELTASONE ) 50 MG tablet Take 1 tab p.o. daily for 5 days. 5 tablet Atonya Templer, FNP   chlorpheniramine-HYDROcodone  (TUSSIONEX) 10-8 MG/5ML Take 5 mLs by mouth every 12 (twelve) hours as needed for  cough. 115 mL Teddy Sharper, FNP      I have reviewed the PDMP during this encounter.   Teddy Sharper, FNP 02/29/24 1337

## 2024-02-29 NOTE — Discharge Instructions (Addendum)
 Advised patient to take medications as directed with food to completion.  Advised patient to take prednisone  with first dose of doxycycline  for the next 5 of 7 days.  Advised may take Tussionex cough syrup for cough prior to sleep due to sedative  effects.  Encouraged increase in daily water intake to 32 ounces per day while taking these medications.  Advised we will follow-up with urine culture results once received.  Advised if symptoms worsen and/or unresolved please follow-up with your PCP or here for further evaluation.

## 2024-02-29 NOTE — ED Triage Notes (Signed)
 Pt presents to [facility] with co sinus pressure and pain, headache, sore throat, cough, otalgia and congestion for one week. Pt reports she has been taking otc cold and flu medication and an antihistamine. Pt reports she has been using her inhaler as well. Reports difficulty sleeping due to the cough.

## 2024-03-01 LAB — URINE CULTURE: Culture: NO GROWTH

## 2024-03-03 ENCOUNTER — Other Ambulatory Visit: Payer: Self-pay | Admitting: Family Medicine

## 2024-03-04 ENCOUNTER — Telehealth: Payer: Self-pay | Admitting: Family Medicine

## 2024-03-04 ENCOUNTER — Telehealth: Payer: Self-pay | Admitting: Emergency Medicine

## 2024-03-04 NOTE — Telephone Encounter (Signed)
 Patient left a vm stating she was prescribed an antibiotic and steroids by [facility] last week.  Now she is having sx's of a yeast infection.  Requesting a Diflucan  be sent into her pharmacy on file.  Please advise.

## 2024-03-04 NOTE — Telephone Encounter (Signed)
 Patient requests diflucan  after antibiotic treatment

## 2024-03-11 ENCOUNTER — Encounter: Payer: Self-pay | Admitting: Family Medicine

## 2024-03-11 ENCOUNTER — Ambulatory Visit: Admitting: Family Medicine

## 2024-03-11 ENCOUNTER — Ambulatory Visit: Payer: Self-pay

## 2024-03-11 VITALS — BP 126/76 | HR 74 | Temp 98.0°F | Resp 16 | Ht 66.0 in | Wt 129.8 lb

## 2024-03-11 DIAGNOSIS — B379 Candidiasis, unspecified: Secondary | ICD-10-CM

## 2024-03-11 DIAGNOSIS — M545 Low back pain, unspecified: Secondary | ICD-10-CM | POA: Diagnosis not present

## 2024-03-11 DIAGNOSIS — N898 Other specified noninflammatory disorders of vagina: Secondary | ICD-10-CM | POA: Diagnosis not present

## 2024-03-11 DIAGNOSIS — G8929 Other chronic pain: Secondary | ICD-10-CM | POA: Diagnosis not present

## 2024-03-11 DIAGNOSIS — T3695XA Adverse effect of unspecified systemic antibiotic, initial encounter: Secondary | ICD-10-CM | POA: Diagnosis not present

## 2024-03-11 LAB — POCT URINALYSIS DIP (MANUAL ENTRY)
Blood, UA: NEGATIVE
Glucose, UA: NEGATIVE mg/dL
Ketones, POC UA: NEGATIVE mg/dL
Nitrite, UA: NEGATIVE
Protein Ur, POC: NEGATIVE mg/dL
Spec Grav, UA: <=1.005 — AB (ref 1.010–1.025)
Urobilinogen, UA: 2 U/dL — AB
pH, UA: 7 (ref 5.0–8.0)

## 2024-03-11 MED ORDER — FLUCONAZOLE 150 MG PO TABS
ORAL_TABLET | ORAL | 0 refills | Status: AC
Start: 2024-03-11 — End: ?

## 2024-03-11 MED ORDER — CETIRIZINE HCL 10 MG PO TABS: 10.0000 mg | ORAL_TABLET | Freq: Every day | ORAL | 11 refills | Status: AC

## 2024-03-11 MED ORDER — FLUTICASONE PROPIONATE 50 MCG/ACT NA SUSP: 2.0000 | NASAL | 5 refills | Status: DC | PRN

## 2024-03-11 MED ORDER — DULOXETINE HCL 20 MG PO CPEP: 20.0000 mg | ORAL_CAPSULE | Freq: Every day | ORAL | 0 refills | Status: AC

## 2024-03-11 NOTE — Progress Notes (Unsigned)
 Chief Complaint  Patient presents with   Vaginal Pain    Vaginal Pain    Jenna Campbell is a 73 y.o. female here for vaginal discharge.  Duration: 3 days Description of discharge: whitish Odor: No New sexual partner: No Urinary complaints: No Recent abx use? Yes +abd pain and itching.  Denies fevers, bleeding  Past Medical History:  Diagnosis Date   Acute bronchitis due to other specified organisms 04/21/2015   Acute hyponatremia 11/19/2020   Acute tension-type headache 12/16/2015   Adjustment disorder with anxious mood 04/21/2015   Allergic asthma with acute exacerbation 09/22/2014   Allergic rhinitis due to pollen 07/23/2014   Allergic urticaria 05/13/2015   Allergy    Alopecia 10/30/2013   Angina pectoris 07/29/2019   Arthritis of carpometacarpal [facility]) joint of left thumb 01/10/2022   Asthma    Atrophic vaginitis 10/30/2013   Basal cell carcinoma 04/21/2015   Bilateral impacted cerumen 04/21/2015   CAD (coronary artery disease) 10/28/2019   Cervical radiculopathy 09/17/2015   Change in bowel function 06/14/2016   Chest pain in adult 05/28/2017   Chest tightness 07/29/2019   Chronic diarrhea 12/16/2015   Colon polyps    Cough 02/14/2016   COVID-19 virus infection 11/19/2020   Cystocele, midline 10/30/2013   Dermatitis 09/22/2014   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   Diverticulosis of large intestine 10/30/2013   Drug-induced myopathy 09/12/2022   Dupuytren's contracture of both hands 09/17/2019   Dyspareunia 07/23/2014   Dysuria 12/16/2015   Encounter for long-term (current) use of other medications 10/19/2012   Esophageal dysphagia 06/14/2016   Essential hypertension    Family history of pheochromocytoma 02/14/2016   Foot pain, bilateral 01/05/2018   Gastroesophageal reflux disease    Gastroesophageal reflux disease without esophagitis 05/13/2015   Generalized abdominal pain 12/16/2015   Generalized anxiety disorder 10/21/2021   GERD (gastroesophageal  reflux disease)    H/O: hysterectomy    Herpes simplex 08/17/2016   History of adenomatous polyp of colon 06/14/2016   History of blood transfusion 1970   Hx of migraines 05/15/2009   Hyperlipemia 12/10/2020   Hypothyroidism 10/30/2013   Hypotonic bladder 10/30/2013   Insomnia 10/11/2017   Localized edema 12/16/2015   Low back pain 09/17/2015   Lumbar paraspinal muscle spasm 04/21/2015   Major depression, recurrent 07/14/2013   Major depressive disorder, recurrent, mild 03/14/2017   Malaise and fatigue 04/21/2015   MCI (mild cognitive impairment) 08/18/2015   Memory difficulty 09/17/2015   Menopause 03/31/2014   Midline low back pain without sciatica 03/31/2014   Migraine    Mild intermittent extrinsic asthma 12/16/2015   Mild persistent asthma 05/13/2015   Mixed dyslipidemia 11/12/2019   Nausea vomiting and diarrhea 11/19/2020   Neck pain 09/17/2015   Neuropathy 09/22/2014   Neutropenia, unspecified type 07/07/2021   Nonintractable headache 11/02/2016   Osteoporosis    Other allergic rhinitis 05/13/2015   Palpitation 09/03/2017   Pedal edema 07/29/2019   Pelvic pain in female 02/17/2015   Personality disorder (HCC) 10/19/2012   Pharyngoesophageal dysphagia 04/21/2015   Plantar fasciitis 01/20/2016   Precordial chest pain 08/18/2015   Primary insomnia 10/11/2017   Prolapse of vaginal vault after hysterectomy 10/30/2013   Rectocele 10/30/2013   Seasonal allergies 10/15/2019   Senile purpura 06/15/2020   Shin splint 04/21/2015   Shortness of breath 07/17/2016   Sinusitis, chronic 10/30/2013   Slow transit constipation 07/23/2014   SOB (shortness of breath) 11/19/2020   Stable angina 10/28/2019   Swelling of lower limb 05/17/2016  Tension-type headache, not intractable 09/17/2015   Thyroid  disease    Tinnitus of both ears 10/11/2017   Unstable angina (HCC) 07/29/2019   Urethral stricture 10/30/2013   Urinary frequency 02/23/2021   Urinary incontinence    Urinary  incontinence without sensory awareness 07/17/2016   Varicose veins of both lower extremities 04/24/2016   Venous insufficiency (chronic) (peripheral) 06/06/2016   Family History  Problem Relation Age of Onset   Asthma Father    Angina Father    Emphysema Father    Alzheimer's disease Mother    Cancer Sister        died of cancer   Colon cancer Neg Hx    Esophageal cancer Neg Hx     BP 126/76 (BP Location: Left Arm, Patient Position: Sitting)   Pulse 74   Temp 98 F (36.7 C) (Oral)   Resp 16   Ht 5' 6 (1.676 m)   Wt 129 lb 12.8 oz (58.9 kg)   SpO2 98%   BMI 20.95 kg/m  Gen: Awake, alert, appears stated age Heart: RRR Ears: 100% obstructed w cerumen on R, patent on L, TM neg Lungs: CTAB, no accessory muscle use Abd: BS+, soft, TTP in suprapubic region, ND, no masses or organomegaly Psych: Age appropriate judgment and insight, nml mood and affect  Antibiotic-induced yeast infection - Plan: fluconazole  (DIFLUCAN ) 150 MG tablet  Chronic bilateral low back pain, unspecified whether sciatica present - Plan: DULoxetine  (CYMBALTA ) 20 MG capsule, POCT urinalysis dipstick, Urine Culture  Diflucan  for possible yeast infxn after abx.  F/u prn.  Pt voiced understanding and agreement to the plan.  Mabel Mt Road Runner, DO 03/11/24 4:44 PM

## 2024-03-11 NOTE — Telephone Encounter (Signed)
 Appt scheduled

## 2024-03-11 NOTE — Patient Instructions (Addendum)
 Stay hydrated.  Consider Vick's under your nose/on your chest to help you breathe.  Continue Zyrtec  and Nasonex .   Let us  know if you need anything.

## 2024-03-11 NOTE — Telephone Encounter (Signed)
 This encounter was created in error - please disregard.

## 2024-03-11 NOTE — Telephone Encounter (Signed)
 FYI Only or Action Required?: FYI only for provider.  Patient was last seen in primary care on 01/08/2024 by Frann Mabel Mt, DO.  Called Nurse Triage reporting Vaginal Pain.  Symptoms began several days ago.  Interventions attempted: Nothing.  Symptoms are: gradually worsening.  Triage Disposition: See Physician Within 24 Hours  Patient/caregiver understands and will follow disposition?: Yes      Copied from CRM [id number]. Topic: Clinical - Red Word Triage >> Mar 11, 2024  1:47 PM Eva FALCON wrote: Red Word that prompted transfer to Nurse Triage: Has been dealing with congestion since before the 16th, was prescribed antibiotic, which has now caused a yeast infection. is requesting a refill for Zyrtec  since she feels like it has been helping with the congestion and something for the yeast infection. She has been feeling extremely fatigue for a few days. Doesn't feel like going out and is hopeful something can be sent it. Reason for Disposition  MODERATE-SEVERE itching (i.e., interferes with school, work, or sleep)  Answer Assessment - Initial Assessment Questions Requesting Zytrec for congestion symptoms.     1. SYMPTOM: What's the main symptom you're concerned about? (e.g., pain, itching, dryness)     Pain and swelling in vaginal area 2. ONSET: When did the  irritation  start?     3 days ago  3. PAIN: Is there any pain? If Yes, ask: How bad is it? (Scale: 1-10; mild, moderate, severe)     Moderate  4. ITCHING: Is there any itching? If Yes, ask: How bad is it? (Scale: 1-10; mild, moderate, severe)     Denies  5. CAUSE: What do you think is causing the discharge? Have you had the same problem before? What happened then?     Denies any discharge  6. OTHER SYMPTOMS: Do you have any other symptoms? (e.g., fever, itching, vaginal bleeding, pain with urination, injury to genital area, vaginal foreign body)     Denies  Protocols used: Vaginal  Symptoms-A-AH

## 2024-03-12 LAB — URINE CULTURE
MICRO NUMBER:: 17157690
Result:: NO GROWTH
SPECIMEN QUALITY:: ADEQUATE

## 2024-03-13 ENCOUNTER — Ambulatory Visit: Payer: Self-pay | Admitting: Family Medicine

## 2024-03-22 ENCOUNTER — Other Ambulatory Visit: Payer: Self-pay | Admitting: Cardiology

## 2024-04-02 ENCOUNTER — Other Ambulatory Visit: Payer: Self-pay | Admitting: Cardiology

## 2024-04-07 ENCOUNTER — Ambulatory Visit: Admitting: *Deleted

## 2024-04-07 VITALS — BP 134/58 | HR 55 | Temp 97.7°F | Resp 18 | Ht 66.0 in | Wt 129.6 lb

## 2024-04-07 DIAGNOSIS — Z0000 Encounter for general adult medical examination without abnormal findings: Secondary | ICD-10-CM | POA: Diagnosis not present

## 2024-04-07 DIAGNOSIS — Z1231 Encounter for screening mammogram for malignant neoplasm of breast: Secondary | ICD-10-CM

## 2024-04-07 NOTE — Patient Instructions (Addendum)
 [name],  Thank you for taking the time for your Medicare Wellness Visit. I appreciate your continued commitment to your health goals. Please review the care plan we discussed, and feel free to reach out if I can assist you further.  Please note that Annual Wellness Visits do not include a physical exam. Some assessments may be limited, especially if the visit was conducted virtually. If needed, we may recommend an in-person follow-up with your provider.   Ongoing Care Seeing your primary care provider every 3 to 6 months helps us  monitor your health and provide consistent, personalized care.  Dr Frann:  05/20/24 9:30am Jenna Maxwell, PA:  04/08/24 10:40am Annual Wellness Visit:  04/08/25 1pm, in person  Referrals If a referral was made during today's visit and you haven't received any updates within two weeks, please contact the referred provider directly to check on the status.  [employer] [place]);  [phone]  Recommended Screenings: Please get your flu vaccine after your sinus congestion clears up. You can get that in our office or at your pharmacy.  Health Maintenance  Topic Date Due   Flu Shot  12/14/2023   Medicare Annual Wellness Visit  02/22/2024   Breast Cancer Screening  10/04/2024   DTaP/Tdap/Td vaccine (2 - Td or Tdap) 05/14/2031   Pneumococcal Vaccine for age over 80  Completed   Osteoporosis screening with Bone Density Scan  Completed   Hepatitis C Screening  Completed   Meningitis B Vaccine  Aged Out   Colon Cancer Screening  Discontinued   COVID-19 Vaccine  Discontinued   Zoster (Shingles) Vaccine  Discontinued       04/06/2024    6:06 PM  Advanced Directives  Does Patient Have a Medical Advance Directive? Yes  Type of Advance Directive Healthcare Power of Attorney  Does patient want to make changes to medical advance directive? No - Patient declined  Copy of Healthcare Power of Attorney in Chart? Yes - validated most recent copy scanned  in chart (See row information)    Vision: Annual vision screenings are recommended for early detection of glaucoma, cataracts, and diabetic retinopathy. These exams can also reveal signs of chronic conditions such as diabetes and high blood pressure.  Dental: Annual dental screenings help detect early signs of oral cancer, gum disease, and other conditions linked to overall health, including heart disease and diabetes.  Please see the attached documents for additional preventive care recommendations.

## 2024-04-07 NOTE — Progress Notes (Signed)
 Chief Complaint  Patient presents with   Medicare Wellness     Subjective:   Jenna Campbell is a 73 y.o. female who presents for a Medicare Annual Wellness Visit.  Allergies (verified) Iodinated contrast media, Kenalog  [triamcinolone  acetonide], Metrizamide, Other, Penicillins, Lipitor [atorvastatin ], Montelukast  sodium, Pitavastatin , Rosuvastatin , Xyzal  [levocetirizine], Cephalosporins, Ciprofloxacin, Diazepam, Latex, and Sulfa antibiotics   History: Past Medical History:  Diagnosis Date   Acute bronchitis due to other specified organisms 04/21/2015   Acute hyponatremia 11/19/2020   Acute tension-type headache 12/16/2015   Adjustment disorder with anxious mood 04/21/2015   Allergic asthma with acute exacerbation 09/22/2014   Allergic rhinitis due to pollen 07/23/2014   Allergic urticaria 05/13/2015   Allergy    Alopecia 10/30/2013   Angina pectoris 07/29/2019   Arthritis of carpometacarpal [facility]) joint of left thumb 01/10/2022   Asthma    Atrophic vaginitis 10/30/2013   Basal cell carcinoma 04/21/2015   Bilateral impacted cerumen 04/21/2015   CAD (coronary artery disease) 10/28/2019   Cervical radiculopathy 09/17/2015   Change in bowel function 06/14/2016   Chest pain in adult 05/28/2017   Chest tightness 07/29/2019   Chronic diarrhea 12/16/2015   Colon polyps    Cough 02/14/2016   COVID-19 virus infection 11/19/2020   Cystocele, midline 10/30/2013   Dermatitis 09/22/2014   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   Diverticulosis of large intestine 10/30/2013   Drug-induced myopathy 09/12/2022   Dupuytren's contracture of both hands 09/17/2019   Dyspareunia 07/23/2014   Dysuria 12/16/2015   Encounter for long-term (current) use of other medications 10/19/2012   Esophageal dysphagia 06/14/2016   Essential hypertension    Family history of pheochromocytoma 02/14/2016   Foot pain, bilateral 01/05/2018   Gastroesophageal reflux disease    Gastroesophageal reflux disease  without esophagitis 05/13/2015   Generalized abdominal pain 12/16/2015   Generalized anxiety disorder 10/21/2021   GERD (gastroesophageal reflux disease)    H/O: hysterectomy    Herpes simplex 08/17/2016   History of adenomatous polyp of colon 06/14/2016   History of blood transfusion 1970   Hx of migraines 05/15/2009   Hyperlipemia 12/10/2020   Hypothyroidism 10/30/2013   Hypotonic bladder 10/30/2013   Insomnia 10/11/2017   Localized edema 12/16/2015   Low back pain 09/17/2015   Lumbar paraspinal muscle spasm 04/21/2015   Major depression, recurrent 07/14/2013   Major depressive disorder, recurrent, mild 03/14/2017   Malaise and fatigue 04/21/2015   MCI (mild cognitive impairment) 08/18/2015   Memory difficulty 09/17/2015   Menopause 03/31/2014   Midline low back pain without sciatica 03/31/2014   Migraine    Mild intermittent extrinsic asthma 12/16/2015   Mild persistent asthma 05/13/2015   Mixed dyslipidemia 11/12/2019   Nausea vomiting and diarrhea 11/19/2020   Neck pain 09/17/2015   Neuropathy 09/22/2014   Neutropenia, unspecified type 07/07/2021   Nonintractable headache 11/02/2016   Osteoporosis    Other allergic rhinitis 05/13/2015   Palpitation 09/03/2017   Pedal edema 07/29/2019   Pelvic pain in female 02/17/2015   Personality disorder (HCC) 10/19/2012   Pharyngoesophageal dysphagia 04/21/2015   Plantar fasciitis 01/20/2016   Precordial chest pain 08/18/2015   Primary insomnia 10/11/2017   Prolapse of vaginal vault after hysterectomy 10/30/2013   Rectocele 10/30/2013   Seasonal allergies 10/15/2019   Senile purpura 06/15/2020   Shin splint 04/21/2015   Shortness of breath 07/17/2016   Sinusitis, chronic 10/30/2013   Slow transit constipation 07/23/2014   SOB (shortness of breath) 11/19/2020   Stable angina 10/28/2019   Swelling  of lower limb 05/17/2016   Tension-type headache, not intractable 09/17/2015   Thyroid  disease    Tinnitus of both ears  10/11/2017   Unstable angina (HCC) 07/29/2019   Urethral stricture 10/30/2013   Urinary frequency 02/23/2021   Urinary incontinence    Urinary incontinence without sensory awareness 07/17/2016   Varicose veins of both lower extremities 04/24/2016   Venous insufficiency (chronic) (peripheral) 06/06/2016   Past Surgical History:  Procedure Laterality Date   BREAST CYST ASPIRATION Left    25 years ago    COLONOSCOPY  04/2016   [facility] Diverticulitis and Multiple colon polyps   CORONARY PRESSURE/FFR STUDY N/A 10/17/2019   Procedure: INTRAVASCULAR PRESSURE WIRE/FFR STUDY;  Surgeon: Mady Bruckner, MD;  Location: [facility];  Service: Cardiovascular;  Laterality: N/A;   ESOPHAGOGASTRODUODENOSCOPY  04/2016   [facility]   LEFT HEART CATH AND CORONARY ANGIOGRAPHY N/A 10/17/2019   Procedure: LEFT HEART CATH AND CORONARY ANGIOGRAPHY;  Surgeon: Mady Bruckner, MD;  Location: [facility];  Service: Cardiovascular;  Laterality: N/A;   MOHS SURGERY  10/05/2015   RECTOCELE REPAIR  2011   SKIN CANCER EXCISION  02/2015   Squamous cell removed from back   TONSILLECTOMY     VAGINAL HYSTERECTOMY     VAGINAL PROLAPSE REPAIR     Family History  Problem Relation Age of Onset   Asthma Father    Angina Father    Emphysema Father    Alzheimer's disease Mother    Cancer Sister        died of cancer   Colon cancer Neg Hx    Esophageal cancer Neg Hx    Social History   Occupational History   Not on file  Tobacco Use   Smoking status: Never   Smokeless tobacco: Never  Vaping Use   Vaping status: Never Used  Substance and Sexual Activity   Alcohol use: Not Currently   Drug use: No   Sexual activity: Never    Partners: Male   Tobacco Counseling Counseling given: Not Answered  SDOH Screenings   Food Insecurity: No Food Insecurity (04/06/2024)  Housing: Low Risk  (04/06/2024)  Transportation Needs: No Transportation Needs (04/06/2024)  Utilities: Not At Risk  (04/07/2024)  Alcohol Screen: Low Risk  (02/22/2023)  Depression (PHQ2-9): Low Risk  (04/07/2024)  Financial Resource Strain: Low Risk  (04/06/2024)  Physical Activity: Sufficiently Active (04/06/2024)  Social Connections: Moderately Integrated (04/06/2024)  Stress: Patient Declined (04/06/2024)  Tobacco Use: Low Risk  (04/07/2024)  Health Literacy: Adequate Health Literacy (02/22/2023)   See flowsheets for full screening details  Depression Screen PHQ 2 & 9 Depression Scale- Over the past 2 weeks, how often have you been bothered by any of the following problems? Little interest or pleasure in doing things: 0 Feeling down, depressed, or hopeless (PHQ Adolescent also includes...irritable): 0 PHQ-2 Total Score: 0 Trouble falling or staying asleep, or sleeping too much: 1 (wakes up but is able to go back to sleep quickly) Feeling tired or having little energy: 0 Poor appetite or overeating (PHQ Adolescent also includes...weight loss): 0 Feeling bad about yourself - or that you are a failure or have let yourself or your family down: 0 Trouble concentrating on things, such as reading the newspaper or watching television (PHQ Adolescent also includes...like school work): 0 Moving or speaking so slowly that other people could have noticed. Or the opposite - being so fidgety or restless that you have been moving around a lot  more than usual: 0 Thoughts that you would be better off dead, or of hurting yourself in some way: 0 PHQ-9 Total Score: 1 If you checked off any problems, how difficult have these problems made it for you to do your work, take care of things at home, or get along with other people?: Not difficult at all  Depression Treatment Depression Interventions/Treatment : EYV7-0 Score <4 Follow-up Not Indicated     Goals Addressed   None    Visit info / Clinical Intake: Medicare Wellness Visit Type:: Subsequent Annual Wellness Visit Persons participating in visit::  patient Medicare Wellness Visit Mode:: In-person (required for WTM) Information given by:: patient Interpreter Needed?: No Pre-visit prep was completed: yes AWV questionnaire completed by patient prior to visit?: yes Date:: 04/06/24 Living arrangements:: (!) lives alone Patient's Overall Health Status Rating: good Typical amount of pain: some Does pain affect daily life?: no Are you currently prescribed opioids?: no  Dietary Habits and Nutritional Risks How many meals a day?: 3 Eats fruit and vegetables daily?: yes Most meals are obtained by: preparing own meals In the last 2 weeks, have you had any of the following?: (!) nausea, vomiting, diarrhea (has had some nausea she thinks is from sinus drainage) Diabetic:: no  Functional Status Activities of Daily Living (to include ambulation/medication): (Patient-Rptd) Independent Ambulation: Independent (Only uses a cane when she has vertigo or feels off balance.) Medication Administration: Independent Home Management: (Patient-Rptd) Independent Manage your own finances?: yes Primary transportation is: driving Concerns about vision?: no *vision screening is required for WTM* (past due with [facility] in [facility], advised to schedule. Plans to schedule cataract extraction after Christmas) Concerns about hearing?: no  Fall Screening Falls in the past year?: (Patient-Rptd) 0 Number of falls in past year: 0 Was there an injury with Fall?: 0 Fall Risk Category Calculator: 0 Patient Fall Risk Level: Low Fall Risk  Fall Risk Patient at Risk for Falls Due to: No Fall Risks Fall risk Follow up: Falls evaluation completed  Home and Transportation Safety: All rugs have non-skid backing?: (!) no All stairs or steps have railings?: yes (on ground floor, doesn't use stairs) Grab bars in the bathtub or shower?: yes Have non-skid surface in bathtub or shower?: yes Good home lighting?: yes Regular seat belt use?: yes Hospital stays in the  last year:: no  Cognitive Assessment Difficulty concentrating, remembering, or making decisions? : no Will 6CIT or Mini Cog be Completed: yes What year is it?: 0 points What month is it?: 0 points Give patient an address phrase to remember (5 components): [address] [place]  About what time is it?: 0 points Count backwards from 20 to 1: 0 points Say the months of the year in reverse: 0 points Repeat the address phrase from earlier: 2 points 6 CIT Score: 2 points  Advance Directives (For Healthcare) Does Patient Have a Medical Advance Directive?: Yes Does patient want to make changes to medical advance directive?: No - Patient declined Type of Advance Directive: Healthcare Power of Attorney Copy of Healthcare Power of Attorney in Chart?: Yes - validated most recent copy scanned in chart (See row information) Would patient like information on creating a medical advance directive?: No - Patient declined  Reviewed/Updated  Reviewed/Updated: Reviewed All (Medical, Surgical, Family, Medications, Allergies, Care Teams, Patient Goals)        Objective:    Today's Vitals   04/07/24 0943  BP: (!) 134/58  Pulse: (!) 55  Resp: 18  Temp: 97.7  F (36.5 C)  TempSrc: Oral  SpO2: 100%  Weight: 129 lb 9.6 oz (58.8 kg)  Height: 5' 6 (1.676 m)   Body mass index is 20.92 kg/m.  Current Medications (verified) Outpatient Encounter Medications as of 04/07/2024  Medication Sig   acetaminophen  (TYLENOL ) 500 MG tablet Take 500 mg by mouth every 6 (six) hours as needed for moderate pain.   acyclovir  (ZOVIRAX ) 400 MG tablet Take 1 tablet (400 mg total) by mouth 2 (two) times daily.   albuterol  (VENTOLIN  HFA) 108 (90 Base) MCG/ACT inhaler Inhale 2 puffs into the lungs every 4 (four) hours as needed for wheezing or shortness of breath.   alendronate  (FOSAMAX ) 70 MG tablet Take 1 tablet (70 mg total) by mouth every 7 (seven) days. Take with a full glass of water on an empty stomach.    ASPIRIN  81 PO Take 81 mg by mouth daily.    betamethasone  valerate (VALISONE ) 0.1 % cream Apply 1 Application topically daily.   Calcium -Phosphorus-Vitamin D  (CALCIUM  GUMMIES PO) Take 1 Piece of gum by mouth daily.   cetirizine  (ZYRTEC ) 10 MG tablet Take 1 tablet (10 mg total) by mouth daily.   chlorpheniramine-HYDROcodone  (TUSSIONEX) 10-8 MG/5ML Take 5 mLs by mouth every 12 (twelve) hours as needed for cough.   cholecalciferol  (VITAMIN D3) 25 MCG (1000 UNIT) tablet Take 1,000 Units by mouth daily.   Coenzyme Q10 (CO Q-10) 200 MG CAPS Take 200 mg by mouth daily.   conjugated estrogens  (PREMARIN ) vaginal cream Place 1 Applicatorful vaginally daily.   DULoxetine  (CYMBALTA ) 20 MG capsule Take 1 capsule (20 mg total) by mouth daily.   Estradiol  1 MG/GM GEL Use daily for 10 days and then 3 times weekly.   ezetimibe  (ZETIA ) 10 MG tablet TAKE 1 TABLET BY MOUTH EVERY DAY   fluconazole  (DIFLUCAN ) 150 MG tablet Take 1 tab, repeat in 72 hours if no improvement.   fluticasone -salmeterol (ADVAIR DISKUS) 250-50 MCG/ACT AEPB TAKE 1 PUFF BY MOUTH TWICE A DAY   furosemide  (LASIX ) 40 MG tablet Take 1 tablet (40 mg total) by mouth daily.   gabapentin  (NEURONTIN ) 300 MG capsule Take 300 mg by mouth in the morning.   ipratropium-albuterol  (DUONEB) 0.5-2.5 (3) MG/3ML SOLN Take 3 mLs by nebulization every 4 (four) hours as needed.   levothyroxine  (SYNTHROID ) 150 MCG tablet Take 1.5 tablets (225 mcg total) by mouth daily before breakfast.   LORazepam  (ATIVAN ) 0.5 MG tablet Take 0.5 mg by mouth every 8 (eight) hours as needed for anxiety.    Multiple Vitamins-Minerals (PRESERVISION AREDS 2 PO) Take 1 tablet by mouth daily.   olopatadine  (PATANOL) 0.1 % ophthalmic solution Place 1 drop into both eyes 2 (two) times daily.   OVER THE COUNTER MEDICATION Take 2 tablets by mouth daily as needed (to prevent UTIs). Verlon Three times a day. No longer than three days   pantoprazole  (PROTONIX ) 40 MG tablet TAKE 1 TABLET BY MOUTH  TWICE A DAY   potassium chloride  (KLOR-CON ) 10 MEQ tablet TAKE 2 TABLETS WITH EVERY DOSE OF LASIX  40 MG.   temazepam  (RESTORIL ) 15 MG capsule Take 15 mg by mouth at bedtime.    topiramate  (TOPAMAX ) 100 MG tablet Take 100-200 mg by mouth at bedtime.   isosorbide  mononitrate (IMDUR ) 30 MG 24 hr tablet Take 0.5 tablets (15 mg total) by mouth daily. Please split the tablet in half and take 1/2 tablet once daily in AM. (Patient not taking: Reported on 04/07/2024)   No facility-administered encounter medications on file as of  04/07/2024.   Hearing/Vision screen No results found. Immunizations and Health Maintenance Health Maintenance  Topic Date Due   Influenza Vaccine  12/14/2023   Mammogram  10/04/2024   Medicare Annual Wellness (AWV)  04/07/2025   DTaP/Tdap/Td (2 - Td or Tdap) 05/14/2031   Pneumococcal Vaccine: 50+ Years  Completed   Bone Density Scan  Completed   Hepatitis C Screening  Completed   Meningococcal B Vaccine  Aged Out   Colonoscopy  Discontinued   COVID-19 Vaccine  Discontinued   Zoster Vaccines- Shingrix  Discontinued        Assessment/Plan:  This is a routine wellness examination for Suhaila.  Patient Care Team: Frann Mabel Mt, DO as PCP - General (Family Medicine) Revankar, Jennifer SAUNDERS, MD as PCP - Cardiology (Cardiology)  I have personally reviewed and noted the following in the patient's chart:   Medical and social history Use of alcohol, tobacco or illicit drugs  Current medications and supplements including opioid prescriptions. Functional ability and status Nutritional status Physical activity Advanced directives List of other physicians Hospitalizations, surgeries, and ER visits in previous 12 months Vitals Screenings to include cognitive, depression, and falls Referrals and appointments  Orders Placed This Encounter  Procedures   MM 3D SCREENING MAMMOGRAM BILATERAL BREAST    Standing Status:   Future    Expected Date:   04/07/2024     Expiration Date:   04/07/2025    Reason for Exam (SYMPTOM  OR DIAGNOSIS REQUIRED):   breast cancer screening    Preferred imaging location?:   [facility]   In addition, I have reviewed and discussed with patient certain preventive protocols, quality metrics, and best practice recommendations. A written personalized care plan for preventive services as well as general preventive health recommendations were provided to patient.   Lolita Libra, CMA   04/07/2024   Return in 1 year (on 04/07/2025).  After Visit Summary: (In Person-Printed) AVS printed and given to the patient  Nurse Notes: nothing significant to report

## 2024-04-08 ENCOUNTER — Ambulatory Visit (INDEPENDENT_AMBULATORY_CARE_PROVIDER_SITE_OTHER): Admitting: Medical

## 2024-04-08 ENCOUNTER — Encounter: Payer: Self-pay | Admitting: Medical

## 2024-04-08 VITALS — BP 140/70 | HR 58 | Temp 97.6°F | Resp 15 | Ht 66.0 in | Wt 129.2 lb

## 2024-04-08 DIAGNOSIS — J309 Allergic rhinitis, unspecified: Secondary | ICD-10-CM | POA: Diagnosis not present

## 2024-04-08 DIAGNOSIS — L989 Disorder of the skin and subcutaneous tissue, unspecified: Secondary | ICD-10-CM | POA: Diagnosis not present

## 2024-04-08 DIAGNOSIS — J0100 Acute maxillary sinusitis, unspecified: Secondary | ICD-10-CM | POA: Diagnosis not present

## 2024-04-08 DIAGNOSIS — Z85828 Personal history of other malignant neoplasm of skin: Secondary | ICD-10-CM | POA: Diagnosis not present

## 2024-04-08 MED ORDER — FLUCONAZOLE 150 MG PO TABS: ORAL_TABLET | ORAL | 1 refills | Status: AC

## 2024-04-08 MED ORDER — DOXYCYCLINE HYCLATE 100 MG PO TABS: 100.0000 mg | ORAL_TABLET | Freq: Two times a day (BID) | ORAL | 0 refills | Status: AC

## 2024-04-08 MED ORDER — PREDNISONE 10 MG PO TABS: ORAL_TABLET | ORAL | 0 refills | Status: DC

## 2024-04-08 NOTE — Progress Notes (Signed)
   Subjective:    Patient ID: Jenna Campbell, female    DOB: 01-Feb-1951, 73 y.o.   MRN: [id number]  HPI Jenna Campbell is a 73 year old female with allergies and asthma who presents with sinus pain and pressure.  She has had shooting facial pain, sinus pressure, and increased mucus for over a month, with recent worsening. She feels a painful "knot" and clogging near her ear. A prior urgent evaluation labeled this as an upper respiratory infection. She completed a 7-day antibiotic course and 5-day prednisone  course with only partial, temporary relief. A chest x-ray done at that visit reportedly showed no acute findings.  Her allergies and asthma flare with environmental exposures. She uses air purifiers and takes Zyrtec , Nasonex , and an inhaler for asthma.  She had Mohs surgery for skin cancer near her nose in 2017. Over the past few months she noticed a new white raised lesion in the same area, with local pain.  She often develops yeast infections after antibiotics and has previously needed Diflucan  for this.   Review of Systems See hpi    Objective:   Physical Exam  General- No acute distress. Pleasant patient. Neck- Full range of motion, no jvd Lungs- Clear, even and unlabored. Heart- regular rate and rhythm. Neurologic- CNII- XII grossly intact.  Heent- maxillary sinus pressure bilaterally but worse on left side. Small bump in middle of old scar from mohs procedure. Ears canals clear and normal tms. Lower ext- calf symmetic, negative homans signs.      Assessment & Plan:   Acute maxillary sinusitis with allergic rhinitis and asthma Recurrent sinus pressure and postnasal drainage, more pronounced on the left side, with a history of upper respiratory infection and allergies. Current symptoms suggest sinusitis complicated by allergies and asthma. Allergic to triamcinolone  but tolerated prednisone  previously. - Prescribed doxycycline  for two weeks. - Prescribed prednisone  with a  six-day taper starting at 60 mg. - Continue Zyrtec  and either Nasonex  or Flonase , based on preference. - Prescribed Diflucan  with one refill for potential yeast infection. - Advised use of Vicks under the nose and on the chest to aid breathing. - Encouraged hydration.  History of skin cancer with new nasal lesion New whitish raised area on the nose, present for a few months, with a history of Mohs surgery in 2017 for skin cancer. No visible skin lesion currently, but pain in the area of previous surgery. - Referred to dermatologist Jenna Campbell for evaluation of the nasal lesion and previous surgery site. - Advised follow-up with dermatologist within three weeks.  Insomnia, early morning awakening Consistent early morning awakening at 4 AM, followed by a brief period of wakefulness before returning to sleep. Possible disruption of sleep cycle. - Recommended magnesium glycinate over the counter to improve sleep quality.  Follow up in 10 days or sooner if needed

## 2024-04-08 NOTE — Patient Instructions (Signed)
 Acute maxillary sinusitis with allergic rhinitis and asthma Recurrent sinus pressure and postnasal drainage, more pronounced on the left side, with a history of upper respiratory infection and allergies. Current symptoms suggest sinusitis complicated by allergies and asthma. Allergic to triamcinolone  but tolerated prednisone  previously. - Prescribed doxycycline  for two weeks. - Prescribed prednisone  with a six-day taper starting at 60 mg. - Continue Zyrtec  and either Nasonex  or Flonase , based on preference. - Prescribed Diflucan  with one refill for potential yeast infection. - Advised use of Vicks under the nose and on the chest to aid breathing. - Encouraged hydration.  History of skin cancer with new nasal lesion New whitish raised area on the nose, present for a few months, with a history of Mohs surgery in 2017 for skin cancer. No visible skin lesion currently, but pain in the area of previous surgery. - Referred to dermatologist Toribio Alert for evaluation of the nasal lesion and previous surgery site. - Advised follow-up with dermatologist within three weeks.  Insomnia, early morning awakening Consistent early morning awakening at 4 AM, followed by a brief period of wakefulness before returning to sleep. Possible disruption of sleep cycle. - Recommended magnesium glycinate over the counter to improve sleep quality.  Follow up in 10 days or sooner if needed

## 2024-04-09 ENCOUNTER — Other Ambulatory Visit: Payer: Self-pay | Admitting: Cardiology

## 2024-04-14 ENCOUNTER — Ambulatory Visit (HOSPITAL_BASED_OUTPATIENT_CLINIC_OR_DEPARTMENT_OTHER)
Admission: RE | Admit: 2024-04-14 | Discharge: 2024-04-14 | Disposition: A | Discharge status: Home / Self Care | Source: Ambulatory Visit | Attending: Family Medicine | Admitting: Family Medicine

## 2024-04-14 ENCOUNTER — Encounter (HOSPITAL_BASED_OUTPATIENT_CLINIC_OR_DEPARTMENT_OTHER): Payer: Self-pay

## 2024-04-14 DIAGNOSIS — Z1231 Encounter for screening mammogram for malignant neoplasm of breast: Secondary | ICD-10-CM | POA: Diagnosis not present

## 2024-04-15 ENCOUNTER — Telehealth: Payer: Self-pay | Admitting: Family Medicine

## 2024-04-15 DIAGNOSIS — M7989 Other specified soft tissue disorders: Secondary | ICD-10-CM

## 2024-04-15 NOTE — Telephone Encounter (Signed)
 Copied from CRM [id number]. Topic: General - Other >> Apr 15, 2024  2:47 PM Sasha M wrote: Reason for CRM: Pt called in to let provider know that she had an appt scheduled with her skin surgery center but had to cancel it due to having a tooth pulled on the same side that she is having her skin issues. She wanted provider to know that once she is healed she will reschedule and has already spoken to skin center.

## 2024-04-15 NOTE — Telephone Encounter (Signed)
 FYI

## 2024-04-15 NOTE — Telephone Encounter (Signed)
 Left VM for patient to call back to schedule an appointment with Dr. Monetta in [place]. CB

## 2024-04-17 ENCOUNTER — Telehealth: Payer: Self-pay | Admitting: Family Medicine

## 2024-04-17 NOTE — Addendum Note (Signed)
 Addended by: TONETTE OLAM ORN on: 04/17/2024 02:22 PM   Modules accepted: Orders

## 2024-04-17 NOTE — Telephone Encounter (Signed)
 Spoke w/ Pt- she takes furosemide  very rarely, maybe once weekly. Taking old prescription but has no more now. Pt states she has chronic constipation which then causes her not to be able to release her bladder well so she takes the furosemide  when needed.

## 2024-04-17 NOTE — Telephone Encounter (Signed)
 Copied from CRM [id number]. Topic: Referral - Question >> Apr 17, 2024  1:32 PM Olam RAMAN wrote: Reason for CRM: Dr ref pt to  a skin surgery and heart dr called and wanted pt to go in for a check up for Dr Izora. Pt would like Dr to know and if he has any recommendations  Cb [phone] (M)

## 2024-04-17 NOTE — Telephone Encounter (Signed)
 Spoke w/ Pt- already taking Miralax daily.

## 2024-04-17 NOTE — Telephone Encounter (Signed)
 Error CRM sent, message does not make sense. Asking for further information.

## 2024-04-17 NOTE — Telephone Encounter (Signed)
 Spoke w/ Pt- she wanted to make PCP aware that she was scheduled to see dermatology last week however had to reschedule d/t a broken tooth. States she will get rescheduled w/ dermatology and cardiology ASAP.

## 2024-04-17 NOTE — Telephone Encounter (Unsigned)
 Copied from CRM #[id number]. Topic: Clinical - Medication Refill >> Apr 17, 2024  1:23 PM Olam RAMAN wrote: Medication: furosemide  (LASIX ) 40 MG tablet   Has the patient contacted their pharmacy? Yes (Agent: If no, request that the patient contact the pharmacy for the refill. If patient does not wish to contact the pharmacy document the reason why and proceed with request.) (Agent: If yes, when and what did the pharmacy advise?)  This is the patient's preferred pharmacy:  [id number] - [place], [place] - [address], [address] AT [facility] [address], [address] [place] [address] Phone: [phone] Fax: [phone]  [id number] [place] [place] - [address] [place] [address] Phone: [phone] Fax: [phone]  Is this the correct pharmacy for this prescription? Yes If no, delete pharmacy and type the correct one.   Has the prescription been filled recently? No  Is the patient out of the medication? Yes  Has the patient been seen for an appointment in the last year OR does the patient have an upcoming appointment? Yes  Can we respond through MyChart? Yes  Agent: Please be advised that Rx refills may take up to 3 business days. We ask that you follow-up with your pharmacy.

## 2024-05-05 ENCOUNTER — Ambulatory Visit: Admitting: Cardiology

## 2024-05-05 NOTE — Progress Notes (Deleted)
 " Cardiology Office Note:    Date:  05/05/2024   ID:  Jenna Campbell, DOB 1950-05-19, MRN [id number]  PCP:  Frann Mabel Mt, DO  Cardiologist:  Redell Leiter, MD    Referring MD: Frann Mabel Mt, DO    ASSESSMENT:    No diagnosis found. PLAN:    In order of problems listed above:  ***   Next appointment: ***   Medication Adjustments/Labs and Tests Ordered: Current medicines are reviewed at length with the patient today.  Concerns regarding medicines are outlined above.  No orders of the defined types were placed in this encounter.  No orders of the defined types were placed in this encounter.    History of Present Illness:    Jenna Campbell is a 73 y.o. female with a hx of coronary artery disease hypertension hyperlipidemia and statin intolerance last seen 03/09/2023.  She had a left heart cath in 2021 showing 50 to 60% ostial proximal LAD stenosis extending into the distal left main coronary artery with FFR. Compliance with diet, lifestyle and medications: *** Past Medical History:  Diagnosis Date   Acute bronchitis due to other specified organisms 04/21/2015   Acute hyponatremia 11/19/2020   Acute tension-type headache 12/16/2015   Adjustment disorder with anxious mood 04/21/2015   Allergic asthma with acute exacerbation 09/22/2014   Allergic rhinitis due to pollen 07/23/2014   Allergic urticaria 05/13/2015   Allergy    Alopecia 10/30/2013   Angina pectoris 07/29/2019   Arthritis of carpometacarpal [facility]) joint of left thumb 01/10/2022   Asthma    Atrophic vaginitis 10/30/2013   Basal cell carcinoma 04/21/2015   Bilateral impacted cerumen 04/21/2015   CAD (coronary artery disease) 10/28/2019   Cervical radiculopathy 09/17/2015   Change in bowel function 06/14/2016   Chest pain in adult 05/28/2017   Chest tightness 07/29/2019   Chronic diarrhea 12/16/2015   Colon polyps    Cough 02/14/2016   COVID-19 virus infection 11/19/2020   Cystocele,  midline 10/30/2013   Dermatitis 09/22/2014   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   Diverticulosis of large intestine 10/30/2013   Drug-induced myopathy 09/12/2022   Dupuytren's contracture of both hands 09/17/2019   Dyspareunia 07/23/2014   Dysuria 12/16/2015   Encounter for long-term (current) use of other medications 10/19/2012   Esophageal dysphagia 06/14/2016   Essential hypertension    Family history of pheochromocytoma 02/14/2016   Foot pain, bilateral 01/05/2018   Gastroesophageal reflux disease    Gastroesophageal reflux disease without esophagitis 05/13/2015   Generalized abdominal pain 12/16/2015   Generalized anxiety disorder 10/21/2021   GERD (gastroesophageal reflux disease)    H/O: hysterectomy    Herpes simplex 08/17/2016   History of adenomatous polyp of colon 06/14/2016   History of blood transfusion 1970   Hx of migraines 05/15/2009   Hyperlipemia 12/10/2020   Hypothyroidism 10/30/2013   Hypotonic bladder 10/30/2013   Insomnia 10/11/2017   Localized edema 12/16/2015   Low back pain 09/17/2015   Lumbar paraspinal muscle spasm 04/21/2015   Major depression, recurrent 07/14/2013   Major depressive disorder, recurrent, mild 03/14/2017   Malaise and fatigue 04/21/2015   MCI (mild cognitive impairment) 08/18/2015   Memory difficulty 09/17/2015   Menopause 03/31/2014   Midline low back pain without sciatica 03/31/2014   Migraine    Mild intermittent extrinsic asthma 12/16/2015   Mild persistent asthma 05/13/2015   Mixed dyslipidemia 11/12/2019   Nausea vomiting and diarrhea 11/19/2020   Neck pain 09/17/2015   Neuropathy 09/22/2014   Neutropenia,  unspecified type 07/07/2021   Nonintractable headache 11/02/2016   Osteoporosis    Other allergic rhinitis 05/13/2015   Palpitation 09/03/2017   Pedal edema 07/29/2019   Pelvic pain in female 02/17/2015   Personality disorder (HCC) 10/19/2012   Pharyngoesophageal dysphagia 04/21/2015   Plantar fasciitis 01/20/2016    Precordial chest pain 08/18/2015   Primary insomnia 10/11/2017   Prolapse of vaginal vault after hysterectomy 10/30/2013   Rectocele 10/30/2013   Seasonal allergies 10/15/2019   Senile purpura 06/15/2020   Shin splint 04/21/2015   Shortness of breath 07/17/2016   Sinusitis, chronic 10/30/2013   Slow transit constipation 07/23/2014   SOB (shortness of breath) 11/19/2020   Stable angina 10/28/2019   Swelling of lower limb 05/17/2016   Tension-type headache, not intractable 09/17/2015   Thyroid  disease    Tinnitus of both ears 10/11/2017   Unstable angina (HCC) 07/29/2019   Urethral stricture 10/30/2013   Urinary frequency 02/23/2021   Urinary incontinence    Urinary incontinence without sensory awareness 07/17/2016   Varicose veins of both lower extremities 04/24/2016   Venous insufficiency (chronic) (peripheral) 06/06/2016    Current Medications: Active Medications[1]    EKGs/Labs/Other Studies Reviewed:    The following studies were reviewed today:  Cardiac Studies & Procedures   ______________________________________________________________________________________________ CARDIAC CATHETERIZATION  CARDIAC CATHETERIZATION 10/17/2019  Conclusion Conclusions: 1. Single vessel coronary artery disease with 50-60% ostial/proximal LAD stenosis extending back into the distal LMCA.  The lesion is not hemodynamically significant (DFR = 0.94; FFR = 0.88). 2. No significant disease involving LCx or RCA. 3. Normal left ventricular contraction and filling pressure.  Recommendations: 1. Medical therapy, including addition of isosorbide  mononitrate and escalation of statin therapy as tolerated.  Lonni Hanson, MD [facility]  Findings Coronary Findings Diagnostic  Dominance: Right  Left Main Vessel is large. Dist LM lesion is 20% stenosed.  Left Anterior Descending Ost LAD lesion is 55% stenosed. Pressure wire/FFR was performed on the lesion. DFR = 0.94.  FFR  0.88.  First Diagonal Branch Vessel is small in size.  Second Diagonal Branch Vessel is moderate in size.  Third Diagonal Branch Vessel is small in size.  Left Circumflex Vessel is large. Vessel is angiographically normal.  First Obtuse Marginal Branch Vessel is moderate in size.  Second Obtuse Marginal Branch Vessel is moderate in size.  Third Obtuse Marginal Branch Vessel is small in size.  Right Coronary Artery Vessel is large. Vessel is angiographically normal.  Right Posterior Descending Artery Vessel is large in size.  Right Posterior Atrioventricular Artery Vessel is moderate in size.  Intervention  No interventions have been documented.   STRESS TESTS  MYOCARDIAL PERFUSION IMAGING 03/15/2017  Interpretation Summary  Nuclear stress EF: 56%.  Probable normal perfusion and soft tissue attenuation (breast) No ischemia  This is a low risk study.      MONITORS  LONG TERM MONITOR (3-14 DAYS) 08/16/2021  Narrative Patch Wear Time:  14 days and 0 hours (2023-03-09T08:54:18-0500 to 2023-03-23T09:54:22-0400)  Patient had a min HR of 40 bpm, max HR of 164 bpm, and avg HR of 60 bpm.  Predominant underlying rhythm was Sinus Rhythm. 22 Supraventricular Tachycardia runs occurred, the run with the fastest interval lasting 5 beats with a max rate of 164 bpm, the longest lasting 13.7 secs with an avg rate of 105 bpm. Isolated SVEs were rare (<1.0%), SVE Couplets were rare (<1.0%), and SVE Triplets were rare (<1.0%).  Isolated VEs were rare (<1.0%), and no VE Couplets or VE Triplets were  present.  Impression: Mildly abnormal but largely unremarkable event monitor.  Patient's symptoms did not correlate with any findings on the monitoring.  Rare brief atrial runs.   CT SCANS  CT CORONARY FRACTIONAL FLOW RESERVE DATA PREP 10/06/2019  Narrative EXAM: CT FFR ANALYSIS  CLINICAL DATA:  73 yo female with chest pain  FINDINGS: FFRct analysis was performed on the  original cardiac CT angiogram dataset. Diagrammatic representation of the FFRct analysis is provided in a separate PDF document in PACS. This dictation was created using the PDF document and an interactive 3D model of the results. 3D model is not available in the EMR/PACS. Normal FFR range is >0.80.  1. Left Main:  No significant stenosis.  2. LAD: Significant proximal stenosis. 3. LCX: No significant stenosis. 4. RCA: No significant stenosis.  IMPRESSION: 1.  CT FFR analysis didn't show any significant stenosis.  1. LM: findings 0.98, 0.97  2. LAD: findings 0.79, 0.77 0.74  3. OM: Findings 0.93, 0.90 0.87 4. RCA: findings 0.96, 0.93 0.89  FFR consistent with significant proximal LAD stenosis.  Note: These examples are not recommendations of [employer] and only provided as examples of what other customers are doing.   Electronically Signed By: Redell Shallow M.D. On: 10/07/2019 14:46   CT SCANS  CT CORONARY MORPH W/CTA COR W/SCORE 10/06/2019  Addendum 10/06/2019  5:01 PM ADDENDUM REPORT: 10/06/2019 16:59  CLINICAL DATA:  73 yo female with chest pain  EXAM: Cardiac/Coronary  CT  TECHNIQUE: The patient was scanned on a [employer].  FINDINGS: A 120 kV prospective scan was triggered in the descending thoracic aorta at 111 HU's. Axial non-contrast 3 mm slices were carried out through the heart. The data set was analyzed on a dedicated work station and scored using the Agatson method. Gantry rotation speed was 250 msecs and collimation was .6 mm. No beta blockade and 0.8 mg of sl NTG was given. The 3D data set was reconstructed in 5% intervals of the 67-82 % of the R-R cycle. Diastolic phases were analyzed on a dedicated work station using MPR, MIP and VRT modes. The patient received 80 cc of contrast.  Aorta:  Normal size.  Mild aortic plaque.  No dissection.  Aortic Valve:  Trileaflet.  No calcifications.  Coronary Arteries:  Normal coronary  origin.  Right dominance.  RCA is a large dominant artery that gives rise to PDA. There is no plaque.  Left main is a large artery that gives rise to LAD and LCX arteries.  LAD is a large vessel that gives rise to medium D1 and D2 and small D3; there is severe (70-99%) stenosis (soft plaque) in the proximal vessel.  LCX is a non-dominant artery that gives rise to large OM1 branch and large OM2 branch. There is no plaque.  Other findings:  Normal pulmonary vein drainage into the left atrium.  Normal let atrial appendage without a thrombus.  Normal size of the pulmonary artery.  IMPRESSION: 1. Coronary calcium  score of 0. This was 0 percentile for age and sex matched control.  2. Normal coronary origin with right dominance.  3. Severe (70-99%) stenosis (soft plaque) in the proximal LAD; CAD-RADS 4. Study will be sent for FFR.  4. Results called to Dr Edwyna.  Redell Shallow   Electronically Signed By: Redell Shallow M.D. On: 10/06/2019 16:59  Narrative EXAM: OVER-READ INTERPRETATION  CT CHEST  The following report is an over-read performed by radiologist Dr. Franky Crease of [facility] on  10/06/2019. This over-read does not include interpretation of cardiac or coronary anatomy or pathology. The coronary CTA interpretation by the cardiologist is attached.  COMPARISON:  None.  FINDINGS: Vascular: Heart is normal size.  Aorta normal caliber.  Mediastinum/Nodes: No adenopathy  Lungs/Pleura: No confluent opacities or effusions.  Upper Abdomen: Imaging into the upper abdomen shows no acute findings.  Musculoskeletal: Chest wall soft tissues are unremarkable. No acute bony abnormality.  IMPRESSION: No acute or significant extracardiac abnormality.  Electronically Signed: By: Franky Crease M.D. On: 10/06/2019 10:40     ______________________________________________________________________________________________          Recent  Labs: 06/01/2023: B Natriuretic Peptide 38.9; Hemoglobin 12.3; Platelets 272 12/05/2023: ALT 9; BUN 13; Creatinine, Ser 0.68; Potassium 3.5; Sodium 132  Recent Lipid Panel    Component Value Date/Time   CHOL 173 09/12/2022 1424   CHOL 164 12/13/2020 0953   TRIG 157.0 (H) 09/12/2022 1424   HDL 39.60 09/12/2022 1424   HDL 39 (L) 12/13/2020 0953   CHOLHDL 4 09/12/2022 1424   VLDL 31.4 09/12/2022 1424   LDLCALC 102 (H) 09/12/2022 1424   LDLCALC 88 12/13/2020 0953    Physical Exam:    VS:  There were no vitals taken for this visit.    Wt Readings from Last 3 Encounters:  04/08/24 129 lb 3.2 oz (58.6 kg)  04/07/24 129 lb 9.6 oz (58.8 kg)  03/11/24 129 lb 12.8 oz (58.9 kg)     GEN: *** Well nourished, well developed in no acute distress HEENT: Normal NECK: No JVD; No carotid bruits LYMPHATICS: No lymphadenopathy CARDIAC: ***RRR, no murmurs, rubs, gallops RESPIRATORY:  Clear to auscultation without rales, wheezing or rhonchi  ABDOMEN: Soft, non-tender, non-distended MUSCULOSKELETAL:  No edema; No deformity  SKIN: Warm and dry NEUROLOGIC:  Alert and oriented x 3 PSYCHIATRIC:  Normal affect    Signed, Redell Leiter, MD  05/05/2024 7:50 AM    [facility]     [1]  No outpatient medications have been marked as taking for the 05/06/24 encounter (Appointment) with Leiter Redell PARAS, MD.   "

## 2024-05-06 ENCOUNTER — Ambulatory Visit: Admitting: Cardiology

## 2024-05-19 ENCOUNTER — Telehealth: Payer: Self-pay | Admitting: *Deleted

## 2024-05-19 ENCOUNTER — Ambulatory Visit: Payer: Self-pay

## 2024-05-19 ENCOUNTER — Telehealth: Payer: Self-pay | Admitting: Cardiology

## 2024-05-19 DIAGNOSIS — I2510 Atherosclerotic heart disease of native coronary artery without angina pectoris: Secondary | ICD-10-CM

## 2024-05-19 DIAGNOSIS — I209 Angina pectoris, unspecified: Secondary | ICD-10-CM

## 2024-05-19 NOTE — Telephone Encounter (Signed)
 Pt already has an appt tomorrow w PCP. Advised to wear mask for appt. Pt also wanted noted that she weighs 127 lb at this time. Also wanted noted that she is changing cardiologists.  FYI Only or Action Required?: FYI only for provider: has appt already.  Patient was last seen in primary care on 04/08/2024 by Dorina Loving, PA-C.  Called Nurse Triage reporting URI.  Symptoms began several days ago.  Interventions attempted: OTC medications: OTC nausea syrup and Prescription medications: benzonatate .  Symptoms are: unchanged.  Triage Disposition: See Today or Tomorrow in Office (overriding Home Care)  Patient/caregiver understands and will follow disposition?: Yes Reason for Disposition  [1] Sinus congestion as part of a cold AND [2] present < 10 days  Answer Assessment - Initial Assessment Questions Pt reports sneezing, cough, sinus congestion, sore throat, fatigue, nausea that began yesterday. Is taking benzonatate , OTC nausea syrup and using humidifier. Has not tested for covid or flu. States mild SOB that improves with inhaler. Denies fever.  1. LOCATION: Where does it hurt?      Had a headache yesterday 2. ONSET: When did the sinus pain start?  (e.g., hours, days)      Yesterday 4. RECURRENT SYMPTOM: Have you ever had sinus problems before? If Yes, ask: When was the last time? and What happened that time?      Hx of sinusitis 6. NASAL DISCHARGE: Do you have discharge from your nose? If so ask, What color?     Not discolored 7. FEVER: Do you have a fever? If Yes, ask: What is it, how was it measured, and when did it start?      Denies  Protocols used: Sinus Pain or Congestion-A-AH  Message from [employer] G sent at 05/19/2024 11:33 AM EST  Reason for Triage: Patient is experiencing mucous in throat and nausea . Callback number  [phone]

## 2024-05-19 NOTE — Telephone Encounter (Signed)
" °*  STAT* If patient is at the pharmacy, call can be transferred to refill team.   1. Which medications need to be refilled? (please list name of each medication and dose if known)   nitroGLYCERIN  (NITROSTAT ) SL tablet 0.8 mg     2. Would you like to learn more about the convenience, safety, & potential cost savings by using the [facility]?   3. Are you open to using the [facility] (Type [facility]. ).  4. Which pharmacy/location (including street and city if local pharmacy) is medication to be sent to?  [id number] - [place], [place] - [address], [address] AT [facility] [place] [address]   5. Do they need a 30 day or 90 day supply?   Patient stated she is almost all of this medication and her prescription has expired.  "

## 2024-05-19 NOTE — Telephone Encounter (Signed)
 Called pt back advised her we will see her tomorrow for best treatment.

## 2024-05-19 NOTE — Telephone Encounter (Signed)
 Patient wants a provider switch from Dr. Monetta in [place] to Dr. Pietro in [facility].

## 2024-05-19 NOTE — Telephone Encounter (Signed)
 Copied from CRM #[id number]. Topic: Clinical - Medical Advice >> May 19, 2024 11:31 AM Terri MATSU wrote: Reason for CRM: Patient is sick and has an appointment tomorrow and wanted to let Dr.Wendling know and if he still wants to see her tomorrow? Please call to verify with patient  [phone]

## 2024-05-20 ENCOUNTER — Encounter: Payer: Self-pay | Admitting: Family Medicine

## 2024-05-20 ENCOUNTER — Ambulatory Visit: Admitting: Family Medicine

## 2024-05-20 VITALS — BP 126/74 | HR 66 | Temp 98.0°F | Resp 16 | Ht 66.0 in | Wt 125.4 lb

## 2024-05-20 DIAGNOSIS — J0100 Acute maxillary sinusitis, unspecified: Secondary | ICD-10-CM | POA: Diagnosis not present

## 2024-05-20 DIAGNOSIS — H6121 Impacted cerumen, right ear: Secondary | ICD-10-CM

## 2024-05-20 MED ORDER — METHYLPREDNISOLONE 4 MG PO TBPK: ORAL_TABLET | ORAL | 0 refills | Status: AC

## 2024-05-20 NOTE — Progress Notes (Signed)
 Chief Complaint  Patient presents with   Nasal Congestion    Nasal Congesrion    Jenna Campbell here for URI complaints.  Duration: 4 days  Associated symptoms: sinus congestion, sinus pain, rhinorrhea, ear fullness, sore throat, and coughing from drainage, nausea from drainage Denies: itchy watery eyes, ear pain, ear drainage, wheezing, shortness of breath, myalgia, and fevers Treatment to date: Mucinex , Zicam nasal spray,  Sick contacts: Yes- going around in ALF  Past Medical History:  Diagnosis Date   Acute bronchitis due to other specified organisms 04/21/2015   Acute hyponatremia 11/19/2020   Acute tension-type headache 12/16/2015   Adjustment disorder with anxious mood 04/21/2015   Allergic asthma with acute exacerbation 09/22/2014   Allergic rhinitis due to pollen 07/23/2014   Allergic urticaria 05/13/2015   Allergy    Alopecia 10/30/2013   Angina pectoris 07/29/2019   Arthritis of carpometacarpal ([facility]) joint of left thumb 01/10/2022   Asthma    Atrophic vaginitis 10/30/2013   Basal cell carcinoma 04/21/2015   Bilateral impacted cerumen 04/21/2015   CAD (coronary artery disease) 10/28/2019   Cervical radiculopathy 09/17/2015   Change in bowel function 06/14/2016   Chest pain in adult 05/28/2017   Chest tightness 07/29/2019   Chronic diarrhea 12/16/2015   Colon polyps    Cough 02/14/2016   COVID-19 virus infection 11/19/2020   Cystocele, midline 10/30/2013   Dermatitis 09/22/2014   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   Diverticulosis of large intestine 10/30/2013   Drug-induced myopathy 09/12/2022   Dupuytren's contracture of both hands 09/17/2019   Dyspareunia 07/23/2014   Dysuria 12/16/2015   Encounter for long-term (current) use of other medications 10/19/2012   Esophageal dysphagia 06/14/2016   Essential hypertension    Family history of pheochromocytoma 02/14/2016   Foot pain, bilateral 01/05/2018   Gastroesophageal reflux disease    Gastroesophageal  reflux disease without esophagitis 05/13/2015   Generalized abdominal pain 12/16/2015   Generalized anxiety disorder 10/21/2021   GERD (gastroesophageal reflux disease)    H/O: hysterectomy    Herpes simplex 08/17/2016   History of adenomatous polyp of colon 06/14/2016   History of blood transfusion 1970   Hx of migraines 05/15/2009   Hyperlipemia 12/10/2020   Hypothyroidism 10/30/2013   Hypotonic bladder 10/30/2013   Insomnia 10/11/2017   Localized edema 12/16/2015   Low back pain 09/17/2015   Lumbar paraspinal muscle spasm 04/21/2015   Major depression, recurrent 07/14/2013   Major depressive disorder, recurrent, mild 03/14/2017   Malaise and fatigue 04/21/2015   MCI (mild cognitive impairment) 08/18/2015   Memory difficulty 09/17/2015   Menopause 03/31/2014   Midline low back pain without sciatica 03/31/2014   Migraine    Mild intermittent extrinsic asthma 12/16/2015   Mild persistent asthma 05/13/2015   Mixed dyslipidemia 11/12/2019   Nausea vomiting and diarrhea 11/19/2020   Neck pain 09/17/2015   Neuropathy 09/22/2014   Neutropenia, unspecified type 07/07/2021   Nonintractable headache 11/02/2016   Osteoporosis    Other allergic rhinitis 05/13/2015   Palpitation 09/03/2017   Pedal edema 07/29/2019   Pelvic pain in female 02/17/2015   Personality disorder (HCC) 10/19/2012   Pharyngoesophageal dysphagia 04/21/2015   Plantar fasciitis 01/20/2016   Precordial chest pain 08/18/2015   Primary insomnia 10/11/2017   Prolapse of vaginal vault after hysterectomy 10/30/2013   Rectocele 10/30/2013   Seasonal allergies 10/15/2019   Senile purpura 06/15/2020   Shin splint 04/21/2015   Shortness of breath 07/17/2016   Sinusitis, chronic 10/30/2013   Slow transit constipation 07/23/2014  SOB (shortness of breath) 11/19/2020   Stable angina 10/28/2019   Swelling of lower limb 05/17/2016   Tension-type headache, not intractable 09/17/2015   Thyroid  disease    Tinnitus of  both ears 10/11/2017   Unstable angina (HCC) 07/29/2019   Urethral stricture 10/30/2013   Urinary frequency 02/23/2021   Urinary incontinence    Urinary incontinence without sensory awareness 07/17/2016   Varicose veins of both lower extremities 04/24/2016   Venous insufficiency (chronic) (peripheral) 06/06/2016    Objective BP 126/74 (BP Location: Left Arm, Patient Position: Sitting)   Pulse 66   Temp 98 F (36.7 C) (Oral)   Resp 16   Ht 5' 6 (1.676 m)   Wt 125 lb 6.4 oz (56.9 kg)   SpO2 98%   BMI 20.24 kg/m  General: Awake, alert, appears stated age HEENT: AT, [place], ears patent on L, 100% obstructed w cerumen on R, TM on L neg, nares patent w/o discharge, pharynx pink and without exudates, MMM, ttp over bl max sinuses Neck: No masses or asymmetry Heart: RRR Lungs: CTAB, no accessory muscle use Psych: Age appropriate judgment and insight, normal mood and affect  Acute maxillary sinusitis, recurrence not specified  Impacted cerumen of right ear  Medrol  Dosepak. Send message in 2-3 d if no better. Continue to push fluids, practice good hand hygiene, cover mouth when coughing. F/u prn. If starting to experience fevers, shaking, or shortness of breath, seek immediate care. Flush ear on R today.  F/u in 1 mo for CPE.  Pt voiced understanding and agreement to the plan.  Mabel Mt Max, DO 05/20/2024 9:54 AM

## 2024-05-20 NOTE — Patient Instructions (Signed)
 Continue to push fluids, practice good hand hygiene, and cover your mouth if you cough. ? ?If you start having fevers, shaking or shortness of breath, seek immediate care. ? ?OK to take Tylenol 1000 mg (2 extra strength tabs) or 975 mg (3 regular strength tabs) every 6 hours as needed. ? ?Let us know if you need anything. ?

## 2024-05-21 MED ORDER — NITROGLYCERIN 0.4 MG SL SUBL: 0.4000 mg | SUBLINGUAL_TABLET | SUBLINGUAL | 6 refills | Status: AC | PRN

## 2024-05-21 NOTE — Telephone Encounter (Signed)
 Pt is requesting a refill on medication nitroglycerin . This medication was D/C off of pt's medication list. Would Dr. Monetta like to restart pt on this medication? Please address

## 2024-05-22 ENCOUNTER — Ambulatory Visit: Payer: Self-pay | Admitting: Cardiology

## 2024-05-27 ENCOUNTER — Other Ambulatory Visit: Payer: Self-pay | Admitting: Family Medicine

## 2024-06-08 ENCOUNTER — Other Ambulatory Visit: Payer: Self-pay | Admitting: Family Medicine

## 2024-06-08 DIAGNOSIS — M7989 Other specified soft tissue disorders: Secondary | ICD-10-CM

## 2024-06-14 ENCOUNTER — Encounter: Payer: Self-pay | Admitting: Family Medicine

## 2024-06-20 ENCOUNTER — Encounter: Payer: Self-pay | Admitting: Family Medicine

## 2024-06-20 ENCOUNTER — Other Ambulatory Visit: Payer: Self-pay | Admitting: Family Medicine

## 2024-06-20 DIAGNOSIS — E079 Disorder of thyroid, unspecified: Secondary | ICD-10-CM

## 2024-06-23 ENCOUNTER — Ambulatory Visit: Payer: Self-pay | Admitting: Cardiology

## 2025-04-08 ENCOUNTER — Ambulatory Visit
